# Patient Record
Sex: Male | Born: 1950 | Race: White | Hispanic: No | Marital: Married | Smoking: Former smoker
Health system: Southern US, Community
[De-identification: ages and names within clinical notes are randomized; demographics above are authoritative.]

## PROBLEM LIST (undated history)

## (undated) DIAGNOSIS — B1920 Unspecified viral hepatitis C without hepatic coma: Secondary | ICD-10-CM

## (undated) DIAGNOSIS — M545 Low back pain, unspecified: Secondary | ICD-10-CM

## (undated) DIAGNOSIS — F329 Major depressive disorder, single episode, unspecified: Secondary | ICD-10-CM

## (undated) DIAGNOSIS — J329 Chronic sinusitis, unspecified: Secondary | ICD-10-CM

## (undated) DIAGNOSIS — K219 Gastro-esophageal reflux disease without esophagitis: Secondary | ICD-10-CM

## (undated) DIAGNOSIS — Z22322 Carrier or suspected carrier of Methicillin resistant Staphylococcus aureus: Secondary | ICD-10-CM

## (undated) DIAGNOSIS — I8500 Esophageal varices without bleeding: Secondary | ICD-10-CM

## (undated) DIAGNOSIS — K7460 Unspecified cirrhosis of liver: Secondary | ICD-10-CM

## (undated) DIAGNOSIS — F419 Anxiety disorder, unspecified: Secondary | ICD-10-CM

## (undated) DIAGNOSIS — F32A Depression, unspecified: Secondary | ICD-10-CM

## (undated) DIAGNOSIS — G4700 Insomnia, unspecified: Secondary | ICD-10-CM

## (undated) DIAGNOSIS — K5730 Diverticulosis of large intestine without perforation or abscess without bleeding: Secondary | ICD-10-CM

## (undated) DIAGNOSIS — N400 Enlarged prostate without lower urinary tract symptoms: Secondary | ICD-10-CM

## (undated) DIAGNOSIS — F909 Attention-deficit hyperactivity disorder, unspecified type: Secondary | ICD-10-CM

## (undated) DIAGNOSIS — F1010 Alcohol abuse, uncomplicated: Secondary | ICD-10-CM

## (undated) DIAGNOSIS — G894 Chronic pain syndrome: Secondary | ICD-10-CM

## (undated) DIAGNOSIS — F319 Bipolar disorder, unspecified: Secondary | ICD-10-CM

## (undated) DIAGNOSIS — Z87442 Personal history of urinary calculi: Secondary | ICD-10-CM

## (undated) DIAGNOSIS — G40909 Epilepsy, unspecified, not intractable, without status epilepticus: Secondary | ICD-10-CM

## (undated) DIAGNOSIS — B182 Chronic viral hepatitis C: Secondary | ICD-10-CM

## (undated) DIAGNOSIS — E119 Type 2 diabetes mellitus without complications: Secondary | ICD-10-CM

## (undated) DIAGNOSIS — E785 Hyperlipidemia, unspecified: Secondary | ICD-10-CM

## (undated) HISTORY — DX: Chronic sinusitis, unspecified: J32.9

## (undated) HISTORY — DX: Chronic pain syndrome: G89.4

## (undated) HISTORY — DX: Personal history of urinary calculi: Z87.442

## (undated) HISTORY — DX: Anxiety disorder, unspecified: F41.9

## (undated) HISTORY — DX: Major depressive disorder, single episode, unspecified: F32.9

## (undated) HISTORY — DX: Alcohol abuse, uncomplicated: F10.10

## (undated) HISTORY — PX: TONSILLECTOMY: SUR1361

## (undated) HISTORY — DX: Hyperlipidemia, unspecified: E78.5

## (undated) HISTORY — DX: Bipolar disorder, unspecified: F31.9

## (undated) HISTORY — DX: Gastro-esophageal reflux disease without esophagitis: K21.9

## (undated) HISTORY — DX: Chronic viral hepatitis C: B18.2

## (undated) HISTORY — DX: Attention-deficit hyperactivity disorder, unspecified type: F90.9

## (undated) HISTORY — DX: Esophageal varices without bleeding: I85.00

## (undated) HISTORY — DX: Depression, unspecified: F32.A

## (undated) HISTORY — DX: Diverticulosis of large intestine without perforation or abscess without bleeding: K57.30

## (undated) HISTORY — DX: Unspecified cirrhosis of liver: K74.60

## (undated) HISTORY — DX: Unspecified viral hepatitis C without hepatic coma: B19.20

## (undated) HISTORY — DX: Type 2 diabetes mellitus without complications: E11.9

## (undated) HISTORY — DX: Low back pain, unspecified: M54.50

## (undated) HISTORY — DX: Insomnia, unspecified: G47.00

## (undated) HISTORY — DX: Low back pain: M54.5

## (undated) HISTORY — DX: Epilepsy, unspecified, not intractable, without status epilepticus: G40.909

## (undated) HISTORY — DX: Benign prostatic hyperplasia without lower urinary tract symptoms: N40.0

---

## 1999-04-04 ENCOUNTER — Encounter: Payer: Self-pay | Admitting: Neurosurgery

## 1999-04-04 ENCOUNTER — Inpatient Hospital Stay (HOSPITAL_COMMUNITY): Admission: RE | Admit: 1999-04-04 | Discharge: 1999-04-04 | Payer: Self-pay | Admitting: Neurosurgery

## 2000-01-21 ENCOUNTER — Ambulatory Visit (HOSPITAL_COMMUNITY): Admission: RE | Admit: 2000-01-21 | Discharge: 2000-01-21 | Payer: Self-pay | Admitting: Neurosurgery

## 2000-01-21 ENCOUNTER — Encounter: Payer: Self-pay | Admitting: Neurosurgery

## 2000-02-06 ENCOUNTER — Encounter: Payer: Self-pay | Admitting: Neurosurgery

## 2000-02-06 ENCOUNTER — Ambulatory Visit (HOSPITAL_COMMUNITY): Admission: RE | Admit: 2000-02-06 | Discharge: 2000-02-06 | Payer: Self-pay | Admitting: Neurosurgery

## 2000-02-20 ENCOUNTER — Encounter: Payer: Self-pay | Admitting: Neurosurgery

## 2000-02-20 ENCOUNTER — Ambulatory Visit (HOSPITAL_COMMUNITY): Admission: RE | Admit: 2000-02-20 | Discharge: 2000-02-20 | Payer: Self-pay | Admitting: Neurosurgery

## 2000-03-05 ENCOUNTER — Encounter: Payer: Self-pay | Admitting: Neurosurgery

## 2000-03-05 ENCOUNTER — Ambulatory Visit (HOSPITAL_COMMUNITY): Admission: RE | Admit: 2000-03-05 | Discharge: 2000-03-05 | Payer: Self-pay | Admitting: Neurosurgery

## 2000-04-10 ENCOUNTER — Ambulatory Visit (HOSPITAL_COMMUNITY): Admission: RE | Admit: 2000-04-10 | Discharge: 2000-04-10 | Payer: Self-pay | Admitting: Neurosurgery

## 2000-04-10 ENCOUNTER — Encounter: Payer: Self-pay | Admitting: Neurosurgery

## 2000-04-25 ENCOUNTER — Ambulatory Visit (HOSPITAL_COMMUNITY): Admission: RE | Admit: 2000-04-25 | Discharge: 2000-04-26 | Payer: Self-pay | Admitting: Neurosurgery

## 2000-04-25 ENCOUNTER — Encounter: Payer: Self-pay | Admitting: Neurosurgery

## 2000-05-02 ENCOUNTER — Emergency Department (HOSPITAL_COMMUNITY): Admission: EM | Admit: 2000-05-02 | Discharge: 2000-05-03 | Payer: Self-pay | Admitting: Emergency Medicine

## 2000-05-02 ENCOUNTER — Encounter: Payer: Self-pay | Admitting: Emergency Medicine

## 2000-05-15 ENCOUNTER — Ambulatory Visit (HOSPITAL_COMMUNITY): Admission: RE | Admit: 2000-05-15 | Discharge: 2000-05-15 | Payer: Self-pay

## 2000-11-22 ENCOUNTER — Encounter: Payer: Self-pay | Admitting: *Deleted

## 2000-11-22 ENCOUNTER — Ambulatory Visit (HOSPITAL_COMMUNITY): Admission: RE | Admit: 2000-11-22 | Discharge: 2000-11-22 | Payer: Self-pay | Admitting: *Deleted

## 2001-03-27 ENCOUNTER — Encounter: Payer: Self-pay | Admitting: Neurosurgery

## 2001-03-27 ENCOUNTER — Ambulatory Visit (HOSPITAL_COMMUNITY): Admission: RE | Admit: 2001-03-27 | Discharge: 2001-03-28 | Payer: Self-pay | Admitting: Neurosurgery

## 2001-06-02 ENCOUNTER — Ambulatory Visit (HOSPITAL_COMMUNITY): Admission: RE | Admit: 2001-06-02 | Discharge: 2001-06-02 | Payer: Self-pay | Admitting: Internal Medicine

## 2001-06-02 ENCOUNTER — Encounter: Payer: Self-pay | Admitting: Internal Medicine

## 2001-08-11 ENCOUNTER — Inpatient Hospital Stay (HOSPITAL_COMMUNITY): Admission: EM | Admit: 2001-08-11 | Discharge: 2001-08-17 | Payer: Self-pay | Admitting: Psychiatry

## 2001-09-02 ENCOUNTER — Encounter: Payer: Self-pay | Admitting: Neurology

## 2001-09-02 ENCOUNTER — Encounter: Payer: Self-pay | Admitting: Emergency Medicine

## 2001-09-02 ENCOUNTER — Inpatient Hospital Stay (HOSPITAL_COMMUNITY): Admission: EM | Admit: 2001-09-02 | Discharge: 2001-09-03 | Payer: Self-pay | Admitting: Neurology

## 2002-08-26 ENCOUNTER — Ambulatory Visit (HOSPITAL_BASED_OUTPATIENT_CLINIC_OR_DEPARTMENT_OTHER): Admission: RE | Admit: 2002-08-26 | Discharge: 2002-08-26 | Payer: Self-pay | Admitting: Urology

## 2003-03-23 ENCOUNTER — Encounter: Admission: RE | Admit: 2003-03-23 | Discharge: 2003-06-21 | Payer: Self-pay | Admitting: Anesthesiology

## 2003-05-28 ENCOUNTER — Ambulatory Visit (HOSPITAL_COMMUNITY): Admission: RE | Admit: 2003-05-28 | Discharge: 2003-05-28 | Payer: Self-pay | Admitting: Anesthesiology

## 2003-06-20 ENCOUNTER — Encounter
Admission: RE | Admit: 2003-06-20 | Discharge: 2003-09-18 | Payer: Self-pay | Admitting: Physical Medicine and Rehabilitation

## 2003-09-16 ENCOUNTER — Encounter
Admission: RE | Admit: 2003-09-16 | Discharge: 2003-11-09 | Payer: Self-pay | Admitting: Physical Medicine and Rehabilitation

## 2003-10-11 ENCOUNTER — Emergency Department (HOSPITAL_COMMUNITY): Admission: EM | Admit: 2003-10-11 | Discharge: 2003-10-11 | Payer: Self-pay | Admitting: Emergency Medicine

## 2003-11-09 ENCOUNTER — Encounter
Admission: RE | Admit: 2003-11-09 | Discharge: 2003-12-30 | Payer: Self-pay | Admitting: Physical Medicine and Rehabilitation

## 2003-11-11 ENCOUNTER — Ambulatory Visit: Payer: Self-pay | Admitting: Physical Medicine and Rehabilitation

## 2003-12-30 ENCOUNTER — Encounter
Admission: RE | Admit: 2003-12-30 | Discharge: 2004-03-29 | Payer: Self-pay | Admitting: Physical Medicine and Rehabilitation

## 2004-02-03 ENCOUNTER — Ambulatory Visit: Payer: Self-pay | Admitting: Physical Medicine and Rehabilitation

## 2004-04-11 ENCOUNTER — Ambulatory Visit: Payer: Self-pay | Admitting: Physical Medicine and Rehabilitation

## 2004-04-11 ENCOUNTER — Encounter
Admission: RE | Admit: 2004-04-11 | Discharge: 2004-07-10 | Payer: Self-pay | Admitting: Physical Medicine and Rehabilitation

## 2004-05-31 ENCOUNTER — Ambulatory Visit: Payer: Self-pay | Admitting: Physical Medicine and Rehabilitation

## 2004-06-14 ENCOUNTER — Ambulatory Visit: Payer: Self-pay | Admitting: Physical Medicine & Rehabilitation

## 2004-07-31 ENCOUNTER — Encounter
Admission: RE | Admit: 2004-07-31 | Discharge: 2004-10-29 | Payer: Self-pay | Admitting: Physical Medicine and Rehabilitation

## 2004-08-01 ENCOUNTER — Ambulatory Visit: Payer: Self-pay | Admitting: Physical Medicine and Rehabilitation

## 2004-09-05 ENCOUNTER — Ambulatory Visit: Payer: Self-pay | Admitting: Physical Medicine and Rehabilitation

## 2004-09-06 ENCOUNTER — Ambulatory Visit: Payer: Self-pay | Admitting: Internal Medicine

## 2004-09-19 ENCOUNTER — Ambulatory Visit: Payer: Self-pay | Admitting: Internal Medicine

## 2004-11-02 ENCOUNTER — Ambulatory Visit: Payer: Self-pay | Admitting: Physical Medicine and Rehabilitation

## 2004-11-02 ENCOUNTER — Encounter
Admission: RE | Admit: 2004-11-02 | Discharge: 2005-01-31 | Payer: Self-pay | Admitting: Physical Medicine and Rehabilitation

## 2004-12-19 ENCOUNTER — Ambulatory Visit: Payer: Self-pay | Admitting: Physical Medicine and Rehabilitation

## 2005-02-10 ENCOUNTER — Ambulatory Visit: Payer: Self-pay | Admitting: Physical Medicine and Rehabilitation

## 2005-02-10 ENCOUNTER — Encounter
Admission: RE | Admit: 2005-02-10 | Discharge: 2005-05-11 | Payer: Self-pay | Admitting: Physical Medicine and Rehabilitation

## 2005-03-13 ENCOUNTER — Ambulatory Visit: Payer: Self-pay | Admitting: Physical Medicine and Rehabilitation

## 2005-04-23 ENCOUNTER — Ambulatory Visit: Payer: Self-pay | Admitting: Physical Medicine and Rehabilitation

## 2005-05-16 ENCOUNTER — Encounter
Admission: RE | Admit: 2005-05-16 | Discharge: 2005-08-14 | Payer: Self-pay | Admitting: Physical Medicine and Rehabilitation

## 2005-06-13 ENCOUNTER — Ambulatory Visit: Payer: Self-pay | Admitting: Physical Medicine and Rehabilitation

## 2005-07-16 ENCOUNTER — Ambulatory Visit: Payer: Self-pay | Admitting: Physical Medicine and Rehabilitation

## 2005-07-24 ENCOUNTER — Ambulatory Visit: Payer: Self-pay | Admitting: Physical Medicine and Rehabilitation

## 2005-08-19 ENCOUNTER — Encounter
Admission: RE | Admit: 2005-08-19 | Discharge: 2005-11-17 | Payer: Self-pay | Admitting: Physical Medicine and Rehabilitation

## 2005-09-17 ENCOUNTER — Ambulatory Visit: Payer: Self-pay | Admitting: Physical Medicine and Rehabilitation

## 2005-10-24 ENCOUNTER — Ambulatory Visit: Payer: Self-pay | Admitting: Internal Medicine

## 2005-10-29 ENCOUNTER — Ambulatory Visit: Payer: Self-pay | Admitting: Internal Medicine

## 2005-11-12 ENCOUNTER — Ambulatory Visit: Payer: Self-pay | Admitting: Internal Medicine

## 2005-11-14 ENCOUNTER — Ambulatory Visit: Payer: Self-pay | Admitting: Physical Medicine and Rehabilitation

## 2005-11-25 ENCOUNTER — Ambulatory Visit: Payer: Self-pay | Admitting: Internal Medicine

## 2005-12-12 ENCOUNTER — Encounter
Admission: RE | Admit: 2005-12-12 | Discharge: 2006-03-12 | Payer: Self-pay | Admitting: Physical Medicine and Rehabilitation

## 2006-01-07 ENCOUNTER — Ambulatory Visit: Payer: Self-pay | Admitting: Physical Medicine and Rehabilitation

## 2006-03-05 ENCOUNTER — Ambulatory Visit: Payer: Self-pay | Admitting: Physical Medicine and Rehabilitation

## 2006-04-01 ENCOUNTER — Encounter
Admission: RE | Admit: 2006-04-01 | Discharge: 2006-06-30 | Payer: Self-pay | Admitting: Physical Medicine and Rehabilitation

## 2006-04-28 ENCOUNTER — Ambulatory Visit: Payer: Self-pay | Admitting: Physical Medicine and Rehabilitation

## 2006-06-02 ENCOUNTER — Ambulatory Visit: Payer: Self-pay | Admitting: Physical Medicine and Rehabilitation

## 2006-06-04 ENCOUNTER — Ambulatory Visit: Payer: Self-pay | Admitting: Internal Medicine

## 2006-07-01 ENCOUNTER — Encounter
Admission: RE | Admit: 2006-07-01 | Discharge: 2006-09-29 | Payer: Self-pay | Admitting: Physical Medicine and Rehabilitation

## 2006-07-01 ENCOUNTER — Ambulatory Visit: Payer: Self-pay | Admitting: Physical Medicine and Rehabilitation

## 2006-08-26 ENCOUNTER — Ambulatory Visit: Payer: Self-pay | Admitting: Physical Medicine and Rehabilitation

## 2006-08-28 ENCOUNTER — Ambulatory Visit: Payer: Self-pay | Admitting: Internal Medicine

## 2006-08-28 LAB — CONVERTED CEMR LAB
Albumin: 3.5 g/dL (ref 3.5–5.2)
Alkaline Phosphatase: 132 units/L — ABNORMAL HIGH (ref 39–117)
BUN: 11 mg/dL (ref 6–23)
Basophils Absolute: 0.1 10*3/uL (ref 0.0–0.1)
Chloride: 104 meq/L (ref 96–112)
Creatinine, Ser: 0.7 mg/dL (ref 0.4–1.5)
GFR calc non Af Amer: 124 mL/min
MCHC: 34.3 g/dL (ref 30.0–36.0)
Monocytes Absolute: 0.9 10*3/uL — ABNORMAL HIGH (ref 0.2–0.7)
Monocytes Relative: 8.3 % (ref 3.0–11.0)
Potassium: 4 meq/L (ref 3.5–5.1)
RBC: 5.25 M/uL (ref 4.22–5.81)
RDW: 12.4 % (ref 11.5–14.6)
TSH: 1.08 microintl units/mL (ref 0.35–5.50)
Total Bilirubin: 1.5 mg/dL — ABNORMAL HIGH (ref 0.3–1.2)

## 2006-09-02 ENCOUNTER — Ambulatory Visit: Payer: Self-pay | Admitting: Internal Medicine

## 2006-09-02 LAB — CONVERTED CEMR LAB
ALT: 63 units/L — ABNORMAL HIGH (ref 0–53)
AST: 83 units/L — ABNORMAL HIGH (ref 0–37)
Albumin: 3.4 g/dL — ABNORMAL LOW (ref 3.5–5.2)
Alkaline Phosphatase: 133 units/L — ABNORMAL HIGH (ref 39–117)
Bilirubin, Direct: 0.4 mg/dL — ABNORMAL HIGH (ref 0.0–0.3)
HCV Ab: POSITIVE — AB
Hep A IgM: NEGATIVE

## 2006-09-04 ENCOUNTER — Ambulatory Visit: Payer: Self-pay | Admitting: Internal Medicine

## 2006-09-04 LAB — CONVERTED CEMR LAB: HCV Quantitative: 1800000 intl units/mL — ABNORMAL HIGH (ref <5)

## 2006-09-12 ENCOUNTER — Ambulatory Visit: Payer: Self-pay | Admitting: Gastroenterology

## 2006-09-23 DIAGNOSIS — K219 Gastro-esophageal reflux disease without esophagitis: Secondary | ICD-10-CM

## 2006-09-23 DIAGNOSIS — B1710 Acute hepatitis C without hepatic coma: Secondary | ICD-10-CM

## 2006-09-23 DIAGNOSIS — F329 Major depressive disorder, single episode, unspecified: Secondary | ICD-10-CM

## 2006-09-23 DIAGNOSIS — M545 Low back pain: Secondary | ICD-10-CM

## 2006-09-23 DIAGNOSIS — J309 Allergic rhinitis, unspecified: Secondary | ICD-10-CM

## 2006-09-23 DIAGNOSIS — F909 Attention-deficit hyperactivity disorder, unspecified type: Secondary | ICD-10-CM

## 2006-09-23 DIAGNOSIS — R569 Unspecified convulsions: Secondary | ICD-10-CM

## 2006-09-23 DIAGNOSIS — E785 Hyperlipidemia, unspecified: Secondary | ICD-10-CM

## 2006-09-23 DIAGNOSIS — F319 Bipolar disorder, unspecified: Secondary | ICD-10-CM

## 2006-09-23 HISTORY — DX: Gastro-esophageal reflux disease without esophagitis: K21.9

## 2006-09-23 HISTORY — DX: Attention-deficit hyperactivity disorder, unspecified type: F90.9

## 2006-10-21 ENCOUNTER — Encounter
Admission: RE | Admit: 2006-10-21 | Discharge: 2007-01-19 | Payer: Self-pay | Admitting: Physical Medicine and Rehabilitation

## 2006-10-22 ENCOUNTER — Ambulatory Visit: Payer: Self-pay | Admitting: Physical Medicine and Rehabilitation

## 2006-11-01 ENCOUNTER — Encounter: Payer: Self-pay | Admitting: *Deleted

## 2006-12-16 ENCOUNTER — Ambulatory Visit: Payer: Self-pay | Admitting: Physical Medicine and Rehabilitation

## 2007-01-05 ENCOUNTER — Telehealth (INDEPENDENT_AMBULATORY_CARE_PROVIDER_SITE_OTHER): Payer: Self-pay | Admitting: *Deleted

## 2007-01-27 ENCOUNTER — Ambulatory Visit: Payer: Self-pay | Admitting: Gastroenterology

## 2007-02-06 ENCOUNTER — Ambulatory Visit: Payer: Self-pay | Admitting: Physical Medicine and Rehabilitation

## 2007-02-06 ENCOUNTER — Encounter
Admission: RE | Admit: 2007-02-06 | Discharge: 2007-02-09 | Payer: Self-pay | Admitting: Physical Medicine and Rehabilitation

## 2007-03-06 ENCOUNTER — Encounter
Admission: RE | Admit: 2007-03-06 | Discharge: 2007-06-04 | Payer: Self-pay | Admitting: Physical Medicine and Rehabilitation

## 2007-04-03 ENCOUNTER — Ambulatory Visit: Payer: Self-pay | Admitting: Physical Medicine and Rehabilitation

## 2007-04-24 ENCOUNTER — Telehealth (INDEPENDENT_AMBULATORY_CARE_PROVIDER_SITE_OTHER): Payer: Self-pay | Admitting: *Deleted

## 2007-05-05 ENCOUNTER — Ambulatory Visit: Payer: Self-pay | Admitting: Physical Medicine and Rehabilitation

## 2007-05-25 ENCOUNTER — Telehealth (INDEPENDENT_AMBULATORY_CARE_PROVIDER_SITE_OTHER): Payer: Self-pay | Admitting: *Deleted

## 2007-06-01 ENCOUNTER — Encounter
Admission: RE | Admit: 2007-06-01 | Discharge: 2007-08-30 | Payer: Self-pay | Admitting: Physical Medicine and Rehabilitation

## 2007-06-03 ENCOUNTER — Ambulatory Visit: Payer: Self-pay | Admitting: Physical Medicine and Rehabilitation

## 2007-07-01 ENCOUNTER — Ambulatory Visit: Payer: Self-pay | Admitting: Physical Medicine and Rehabilitation

## 2007-07-29 ENCOUNTER — Ambulatory Visit: Payer: Self-pay | Admitting: Physical Medicine and Rehabilitation

## 2007-08-27 ENCOUNTER — Telehealth (INDEPENDENT_AMBULATORY_CARE_PROVIDER_SITE_OTHER): Payer: Self-pay | Admitting: *Deleted

## 2007-08-27 ENCOUNTER — Encounter
Admission: RE | Admit: 2007-08-27 | Discharge: 2007-11-25 | Payer: Self-pay | Admitting: Physical Medicine and Rehabilitation

## 2007-08-28 ENCOUNTER — Ambulatory Visit: Payer: Self-pay | Admitting: Physical Medicine and Rehabilitation

## 2007-09-24 ENCOUNTER — Ambulatory Visit: Payer: Self-pay | Admitting: Physical Medicine and Rehabilitation

## 2007-09-28 ENCOUNTER — Telehealth (INDEPENDENT_AMBULATORY_CARE_PROVIDER_SITE_OTHER): Payer: Self-pay | Admitting: *Deleted

## 2007-10-30 ENCOUNTER — Ambulatory Visit: Payer: Self-pay | Admitting: Physical Medicine and Rehabilitation

## 2007-10-30 ENCOUNTER — Encounter: Payer: Self-pay | Admitting: Internal Medicine

## 2007-11-02 ENCOUNTER — Ambulatory Visit: Payer: Self-pay | Admitting: Internal Medicine

## 2007-11-02 DIAGNOSIS — R5383 Other fatigue: Secondary | ICD-10-CM

## 2007-11-02 DIAGNOSIS — Z87442 Personal history of urinary calculi: Secondary | ICD-10-CM

## 2007-11-02 DIAGNOSIS — N400 Enlarged prostate without lower urinary tract symptoms: Secondary | ICD-10-CM

## 2007-11-02 DIAGNOSIS — R5381 Other malaise: Secondary | ICD-10-CM

## 2007-11-02 HISTORY — DX: Personal history of urinary calculi: Z87.442

## 2007-11-02 LAB — CONVERTED CEMR LAB
Albumin: 3.4 g/dL — ABNORMAL LOW (ref 3.5–5.2)
Alkaline Phosphatase: 168 units/L — ABNORMAL HIGH (ref 39–117)
Bilirubin, Direct: 0.7 mg/dL — ABNORMAL HIGH (ref 0.0–0.3)
Calcium: 8.9 mg/dL (ref 8.4–10.5)
Cholesterol: 173 mg/dL (ref 0–200)
Direct LDL: 81.5 mg/dL
Eosinophils Absolute: 0.3 10*3/uL (ref 0.0–0.7)
GFR calc Af Amer: 128 mL/min
GFR calc non Af Amer: 106 mL/min
HCT: 46.4 % (ref 39.0–52.0)
Hemoglobin: 16.4 g/dL (ref 13.0–17.0)
MCHC: 35.4 g/dL (ref 30.0–36.0)
MCV: 94.2 fL (ref 78.0–100.0)
Monocytes Absolute: 0.6 10*3/uL (ref 0.1–1.0)
Neutro Abs: 3.2 10*3/uL (ref 1.4–7.7)
PSA: 0.11 ng/mL (ref 0.10–4.00)
Platelets: 208 10*3/uL (ref 150–400)
Potassium: 4.1 meq/L (ref 3.5–5.1)
RDW: 12.4 % (ref 11.5–14.6)
Sodium: 142 meq/L (ref 135–145)
Total Protein: 7.1 g/dL (ref 6.0–8.3)
Triglycerides: 228 mg/dL (ref 0–149)
Uric Acid, Serum: 6.3 mg/dL (ref 4.0–7.8)

## 2007-11-25 ENCOUNTER — Encounter
Admission: RE | Admit: 2007-11-25 | Discharge: 2008-02-23 | Payer: Self-pay | Admitting: Physical Medicine and Rehabilitation

## 2007-11-27 ENCOUNTER — Ambulatory Visit: Payer: Self-pay | Admitting: Physical Medicine and Rehabilitation

## 2007-12-25 ENCOUNTER — Ambulatory Visit: Payer: Self-pay | Admitting: Physical Medicine and Rehabilitation

## 2007-12-29 ENCOUNTER — Telehealth (INDEPENDENT_AMBULATORY_CARE_PROVIDER_SITE_OTHER): Payer: Self-pay | Admitting: *Deleted

## 2007-12-30 ENCOUNTER — Ambulatory Visit: Payer: Self-pay | Admitting: Internal Medicine

## 2007-12-30 DIAGNOSIS — J019 Acute sinusitis, unspecified: Secondary | ICD-10-CM | POA: Insufficient documentation

## 2008-01-01 ENCOUNTER — Telehealth (INDEPENDENT_AMBULATORY_CARE_PROVIDER_SITE_OTHER): Payer: Self-pay | Admitting: *Deleted

## 2008-01-22 ENCOUNTER — Ambulatory Visit: Payer: Self-pay | Admitting: Physical Medicine and Rehabilitation

## 2008-02-01 ENCOUNTER — Telehealth (INDEPENDENT_AMBULATORY_CARE_PROVIDER_SITE_OTHER): Payer: Self-pay | Admitting: *Deleted

## 2008-02-15 ENCOUNTER — Ambulatory Visit: Payer: Self-pay | Admitting: Physical Medicine and Rehabilitation

## 2008-03-04 HISTORY — PX: OTHER SURGICAL HISTORY: SHX169

## 2008-03-09 ENCOUNTER — Telehealth (INDEPENDENT_AMBULATORY_CARE_PROVIDER_SITE_OTHER): Payer: Self-pay | Admitting: *Deleted

## 2008-03-15 ENCOUNTER — Encounter
Admission: RE | Admit: 2008-03-15 | Discharge: 2008-06-13 | Payer: Self-pay | Admitting: Physical Medicine and Rehabilitation

## 2008-03-18 ENCOUNTER — Telehealth (INDEPENDENT_AMBULATORY_CARE_PROVIDER_SITE_OTHER): Payer: Self-pay | Admitting: *Deleted

## 2008-03-18 ENCOUNTER — Ambulatory Visit: Payer: Self-pay | Admitting: Physical Medicine and Rehabilitation

## 2008-03-19 ENCOUNTER — Ambulatory Visit: Payer: Self-pay | Admitting: *Deleted

## 2008-03-28 ENCOUNTER — Telehealth (INDEPENDENT_AMBULATORY_CARE_PROVIDER_SITE_OTHER): Payer: Self-pay | Admitting: *Deleted

## 2008-04-07 ENCOUNTER — Telehealth (INDEPENDENT_AMBULATORY_CARE_PROVIDER_SITE_OTHER): Payer: Self-pay | Admitting: *Deleted

## 2008-04-12 ENCOUNTER — Telehealth (INDEPENDENT_AMBULATORY_CARE_PROVIDER_SITE_OTHER): Payer: Self-pay | Admitting: *Deleted

## 2008-04-15 ENCOUNTER — Telehealth (INDEPENDENT_AMBULATORY_CARE_PROVIDER_SITE_OTHER): Payer: Self-pay | Admitting: *Deleted

## 2008-04-15 ENCOUNTER — Ambulatory Visit: Payer: Self-pay | Admitting: Physical Medicine and Rehabilitation

## 2008-04-26 ENCOUNTER — Encounter: Payer: Self-pay | Admitting: Internal Medicine

## 2008-05-04 ENCOUNTER — Telehealth (INDEPENDENT_AMBULATORY_CARE_PROVIDER_SITE_OTHER): Payer: Self-pay | Admitting: *Deleted

## 2008-05-13 ENCOUNTER — Ambulatory Visit: Payer: Self-pay | Admitting: Physical Medicine and Rehabilitation

## 2008-05-18 ENCOUNTER — Encounter: Payer: Self-pay | Admitting: Internal Medicine

## 2008-06-06 ENCOUNTER — Telehealth (INDEPENDENT_AMBULATORY_CARE_PROVIDER_SITE_OTHER): Payer: Self-pay | Admitting: *Deleted

## 2008-06-15 ENCOUNTER — Encounter: Payer: Self-pay | Admitting: Internal Medicine

## 2008-06-17 ENCOUNTER — Ambulatory Visit: Payer: Self-pay | Admitting: Physical Medicine and Rehabilitation

## 2008-06-17 ENCOUNTER — Encounter
Admission: RE | Admit: 2008-06-17 | Discharge: 2008-09-15 | Payer: Self-pay | Admitting: Physical Medicine and Rehabilitation

## 2008-06-28 ENCOUNTER — Telehealth: Payer: Self-pay | Admitting: Internal Medicine

## 2008-06-30 ENCOUNTER — Telehealth (INDEPENDENT_AMBULATORY_CARE_PROVIDER_SITE_OTHER): Payer: Self-pay | Admitting: *Deleted

## 2008-07-07 ENCOUNTER — Telehealth (INDEPENDENT_AMBULATORY_CARE_PROVIDER_SITE_OTHER): Payer: Self-pay | Admitting: *Deleted

## 2008-07-15 ENCOUNTER — Ambulatory Visit: Payer: Self-pay | Admitting: Physical Medicine and Rehabilitation

## 2008-08-17 ENCOUNTER — Telehealth (INDEPENDENT_AMBULATORY_CARE_PROVIDER_SITE_OTHER): Payer: Self-pay | Admitting: *Deleted

## 2008-08-24 ENCOUNTER — Ambulatory Visit: Payer: Self-pay | Admitting: Physical Medicine and Rehabilitation

## 2008-09-01 ENCOUNTER — Telehealth (INDEPENDENT_AMBULATORY_CARE_PROVIDER_SITE_OTHER): Payer: Self-pay | Admitting: *Deleted

## 2008-09-16 ENCOUNTER — Telehealth: Payer: Self-pay | Admitting: Internal Medicine

## 2008-09-19 ENCOUNTER — Telehealth: Payer: Self-pay | Admitting: Internal Medicine

## 2008-09-20 ENCOUNTER — Encounter
Admission: RE | Admit: 2008-09-20 | Discharge: 2008-12-19 | Payer: Self-pay | Admitting: Physical Medicine and Rehabilitation

## 2008-09-21 ENCOUNTER — Ambulatory Visit: Payer: Self-pay | Admitting: Physical Medicine and Rehabilitation

## 2008-10-19 ENCOUNTER — Ambulatory Visit: Payer: Self-pay | Admitting: Physical Medicine and Rehabilitation

## 2008-11-14 ENCOUNTER — Ambulatory Visit: Payer: Self-pay | Admitting: Physical Medicine and Rehabilitation

## 2008-11-16 ENCOUNTER — Telehealth: Payer: Self-pay | Admitting: Internal Medicine

## 2008-12-19 ENCOUNTER — Telehealth: Payer: Self-pay | Admitting: Internal Medicine

## 2008-12-23 ENCOUNTER — Telehealth: Payer: Self-pay | Admitting: Internal Medicine

## 2009-01-16 ENCOUNTER — Telehealth: Payer: Self-pay | Admitting: Internal Medicine

## 2009-01-18 ENCOUNTER — Ambulatory Visit: Payer: Self-pay | Admitting: Internal Medicine

## 2009-01-18 DIAGNOSIS — R509 Fever, unspecified: Secondary | ICD-10-CM

## 2009-01-18 DIAGNOSIS — G894 Chronic pain syndrome: Secondary | ICD-10-CM

## 2009-01-18 DIAGNOSIS — M79609 Pain in unspecified limb: Secondary | ICD-10-CM | POA: Insufficient documentation

## 2009-01-18 HISTORY — DX: Chronic pain syndrome: G89.4

## 2009-01-19 LAB — CONVERTED CEMR LAB
ALT: 20 units/L (ref 0–53)
AST: 40 units/L — ABNORMAL HIGH (ref 0–37)
Alkaline Phosphatase: 157 units/L — ABNORMAL HIGH (ref 39–117)
BUN: 7 mg/dL (ref 6–23)
Basophils Absolute: 0.2 10*3/uL — ABNORMAL HIGH (ref 0.0–0.1)
Bilirubin, Direct: 0.4 mg/dL — ABNORMAL HIGH (ref 0.0–0.3)
Calcium: 8.7 mg/dL (ref 8.4–10.5)
Cholesterol: 162 mg/dL (ref 0–200)
Eosinophils Relative: 4.9 % (ref 0.0–5.0)
GFR calc non Af Amer: 122.72 mL/min (ref >60)
Glucose, Bld: 112 mg/dL — ABNORMAL HIGH (ref 70–99)
HCT: 42.8 % (ref 39.0–52.0)
Hgb A1c MFr Bld: 6.1 % (ref 4.6–6.5)
LDL Cholesterol: 80 mg/dL (ref 0–99)
Lymphocytes Relative: 32.2 % (ref 12.0–46.0)
Lymphs Abs: 2.7 10*3/uL (ref 0.7–4.0)
Monocytes Relative: 6 % (ref 3.0–12.0)
PSA: 0.08 ng/mL — ABNORMAL LOW (ref 0.10–4.00)
Platelets: 131 10*3/uL — ABNORMAL LOW (ref 150.0–400.0)
Potassium: 3.5 meq/L (ref 3.5–5.1)
RDW: 13.1 % (ref 11.5–14.6)
Sodium: 142 meq/L (ref 135–145)
TSH: 1.22 microintl units/mL (ref 0.35–5.50)
Total Bilirubin: 1.4 mg/dL — ABNORMAL HIGH (ref 0.3–1.2)
Total CHOL/HDL Ratio: 3
VLDL: 27.6 mg/dL (ref 0.0–40.0)
WBC: 8.3 10*3/uL (ref 4.5–10.5)

## 2009-01-20 ENCOUNTER — Telehealth: Payer: Self-pay | Admitting: Internal Medicine

## 2009-01-23 ENCOUNTER — Encounter: Payer: Self-pay | Admitting: Internal Medicine

## 2009-03-01 ENCOUNTER — Encounter: Payer: Self-pay | Admitting: Internal Medicine

## 2009-03-21 ENCOUNTER — Encounter: Payer: Self-pay | Admitting: Internal Medicine

## 2009-03-23 ENCOUNTER — Encounter: Payer: Self-pay | Admitting: Internal Medicine

## 2009-04-12 ENCOUNTER — Telehealth: Payer: Self-pay | Admitting: Internal Medicine

## 2009-04-19 ENCOUNTER — Ambulatory Visit: Payer: Self-pay | Admitting: Internal Medicine

## 2009-04-19 DIAGNOSIS — K5730 Diverticulosis of large intestine without perforation or abscess without bleeding: Secondary | ICD-10-CM | POA: Insufficient documentation

## 2009-04-19 DIAGNOSIS — G4700 Insomnia, unspecified: Secondary | ICD-10-CM

## 2009-04-19 DIAGNOSIS — F411 Generalized anxiety disorder: Secondary | ICD-10-CM | POA: Insufficient documentation

## 2009-04-19 DIAGNOSIS — I8500 Esophageal varices without bleeding: Secondary | ICD-10-CM

## 2009-04-19 HISTORY — DX: Insomnia, unspecified: G47.00

## 2009-04-19 HISTORY — DX: Esophageal varices without bleeding: I85.00

## 2009-04-24 ENCOUNTER — Encounter: Payer: Self-pay | Admitting: Internal Medicine

## 2009-05-18 ENCOUNTER — Encounter: Payer: Self-pay | Admitting: Internal Medicine

## 2009-05-18 ENCOUNTER — Telehealth: Payer: Self-pay | Admitting: Internal Medicine

## 2009-05-22 ENCOUNTER — Encounter: Payer: Self-pay | Admitting: Internal Medicine

## 2009-05-23 ENCOUNTER — Encounter: Payer: Self-pay | Admitting: Internal Medicine

## 2009-06-05 ENCOUNTER — Telehealth: Payer: Self-pay | Admitting: Internal Medicine

## 2009-06-22 ENCOUNTER — Ambulatory Visit: Payer: Self-pay | Admitting: Internal Medicine

## 2009-06-22 DIAGNOSIS — R079 Chest pain, unspecified: Secondary | ICD-10-CM | POA: Insufficient documentation

## 2009-07-07 ENCOUNTER — Telehealth: Payer: Self-pay | Admitting: Internal Medicine

## 2009-07-21 ENCOUNTER — Telehealth: Payer: Self-pay | Admitting: Internal Medicine

## 2009-08-09 ENCOUNTER — Telehealth: Payer: Self-pay | Admitting: Internal Medicine

## 2009-08-29 ENCOUNTER — Encounter: Payer: Self-pay | Admitting: Internal Medicine

## 2009-10-12 ENCOUNTER — Ambulatory Visit: Payer: Self-pay | Admitting: Internal Medicine

## 2010-01-02 ENCOUNTER — Ambulatory Visit: Payer: Self-pay | Admitting: Internal Medicine

## 2010-01-12 ENCOUNTER — Ambulatory Visit: Payer: Self-pay | Admitting: Internal Medicine

## 2010-01-30 ENCOUNTER — Telehealth: Payer: Self-pay | Admitting: Internal Medicine

## 2010-02-08 ENCOUNTER — Telehealth: Payer: Self-pay | Admitting: Internal Medicine

## 2010-02-21 ENCOUNTER — Ambulatory Visit: Payer: Self-pay | Admitting: Internal Medicine

## 2010-02-21 ENCOUNTER — Telehealth: Payer: Self-pay | Admitting: Internal Medicine

## 2010-02-21 ENCOUNTER — Encounter: Payer: Self-pay | Admitting: Internal Medicine

## 2010-02-21 LAB — CONVERTED CEMR LAB
ALT: 19 units/L (ref 0–53)
Alkaline Phosphatase: 171 units/L — ABNORMAL HIGH (ref 39–117)
Basophils Absolute: 0.1 10*3/uL (ref 0.0–0.1)
Bilirubin, Direct: 0.4 mg/dL — ABNORMAL HIGH (ref 0.0–0.3)
Calcium: 8.5 mg/dL (ref 8.4–10.5)
Chloride: 107 meq/L (ref 96–112)
Cholesterol: 148 mg/dL (ref 0–200)
Creatinine, Ser: 0.8 mg/dL (ref 0.4–1.5)
Eosinophils Absolute: 0.4 10*3/uL (ref 0.0–0.7)
Hemoglobin: 13.2 g/dL (ref 13.0–17.0)
LDL Cholesterol: 68 mg/dL (ref 0–99)
Lymphocytes Relative: 40.8 % (ref 12.0–46.0)
MCHC: 34.7 g/dL (ref 30.0–36.0)
Monocytes Relative: 7.8 % (ref 3.0–12.0)
Neutro Abs: 3.4 10*3/uL (ref 1.4–7.7)
Platelets: 132 10*3/uL — ABNORMAL LOW (ref 150.0–400.0)
RDW: 15.6 % — ABNORMAL HIGH (ref 11.5–14.6)
Sodium: 141 meq/L (ref 135–145)
Total Bilirubin: 1.7 mg/dL — ABNORMAL HIGH (ref 0.3–1.2)
Total CHOL/HDL Ratio: 2
Triglycerides: 104 mg/dL (ref 0.0–149.0)
VLDL: 20.8 mg/dL (ref 0.0–40.0)

## 2010-02-26 ENCOUNTER — Encounter: Payer: Self-pay | Admitting: Internal Medicine

## 2010-02-27 ENCOUNTER — Telehealth: Payer: Self-pay | Admitting: Internal Medicine

## 2010-02-27 ENCOUNTER — Ambulatory Visit
Admission: RE | Admit: 2010-02-27 | Discharge: 2010-02-27 | Payer: Self-pay | Source: Home / Self Care | Attending: Internal Medicine | Admitting: Internal Medicine

## 2010-04-05 NOTE — Progress Notes (Signed)
Summary: med SE?  Phone Note Call from Patient Call back at [facility] Phone [phone]   Caller: Patient Summary of Call: Pt called stating he started Cymbalta to help with pain sxs but has stopped due to SE- forgetfulness and dizziness. Pt would like to go back to  Pristiq but is unsure if he should be weaned off Cymbalta first or slowly re-started on Pristiq, Please advise. Initial call taken by: Margaret Pyle, CMA,  February 08, 2010 1:48 PM  Follow-up for Phone Call        this does sound like SE of cymbalta;  ok to just stop at this time;  ok to change back to pristiq - done escript Follow-up by: [name] MD,  February 08, 2010 4:23 PM  Additional Follow-up for Phone Call Additional follow up Details #1::        Pt advised and understood. Additional Follow-up by: Margaret Pyle, CMA,  February 08, 2010 4:33 PM   New Allergies: * CYMBALTA New/Updated Medications: PRISTIQ 50 MG XR24H-TAB (DESVENLAFAXINE SUCCINATE) 1 by mouth once daily New Allergies: * CYMBALTAPrescriptions: PRISTIQ 50 MG XR24H-TAB (DESVENLAFAXINE SUCCINATE) 1 by mouth once daily  #90 x 3   Entered and Authorized by:   [name] MD   Signed by:   [name] MD on 02/08/2010   Method used:   Electronically to        [facility]  [address] [phone]* (retail)       [address]       [place], [place]  [address]       Ph: [phone] or [phone]       Fax: [phone]   RxID:   [id number]

## 2010-04-05 NOTE — Medication Information (Signed)
Summary: Pristiq Approved/[facility]  Pristiq Approved/[facility]   Imported By: Sherian Rein 05/26/2009 09:01:34  _____________________________________________________________________  External Attachment:    Type:   Image     Comment:   External Document

## 2010-04-05 NOTE — Progress Notes (Signed)
Summary: RX REQUEST  Phone Note Call from Patient Call back at Home Phone [phone]   Caller: Patient Call For: Kurt Levins MD Summary of Call: Pt had an OV on 01/02/2010 and he stated that he still in pain, pt would like to know if he could get refill for  OXYCODONE 5MG  instead of HYDROCODONE-ACETAMINOPHEN 7.5-325MG  due to the pain. Pt also would like to know if he could get another refill of CYMBALTA 30MG  for another week. Please advise.  Initial call taken by: Livingston Diones,  February 21, 2010 8:51 AM  Follow-up for Phone Call        I'm just not comfortable with going changing the pain meds as he suggests;  we can try the cymbalta but would have to be "ongoing" as one wk is very unlikely to help  I would suggest pain clinic - can I refer?  I dont feel comfortable with his level of need for treatment, as I am not a pain specialist Follow-up by: Kurt Levins MD,  February 21, 2010 12:12 PM  Additional Follow-up for Phone Call Additional follow up Details #1::        left message on machine for pt to return my call. Margaret Pyle, CMA  February 21, 2010 2:04 PM   Pt states that if Dr Jonny Ruiz did not feel comfortable with changes he will leave as is. Pt also declined referral to pain clinic at this time. Additional Follow-up by: Margaret Pyle, CMA,  February 22, 2010 8:39 AM    Additional Follow-up for Phone Call Additional follow up Details #2::    pt would like referral to a pain specialist-preferrbly in [place] or [facility] area Follow-up by: Brenton Grills CMA Duncan Dull),  February 21, 2010 4:59 PM  Additional Follow-up for Phone Call Additional follow up Details #3:: Details for Additional Follow-up Action Taken: ok I will refer Additional Follow-up by: Kurt Levins MD,  February 21, 2010 7:42 PM

## 2010-04-05 NOTE — Letter (Signed)
Summary: [facility]  [facility]   Imported By: Sherian Rein 03/10/2009 09:27:39  _____________________________________________________________________  External Attachment:    Type:   Image     Comment:   External Document

## 2010-04-05 NOTE — Progress Notes (Signed)
Summary: med refill  Phone Note Refill Request  on May 18, 2009 2:51 PM  Refills Requested: Medication #1:  HYDROCODONE-ACETAMINOPHEN 10-325 MG TABS Take 1 tablet by mouth four times a day as needed - to fill nov 17   Dosage confirmed as above?Dosage Confirmed   [facility] [address] [id number] Initial call taken by: Scharlene Gloss,  May 18, 2009 2:52 PM  Follow-up for Phone Call        done hardcopy to LIM side B - dahlia  Follow-up by: [name] MD,  May 18, 2009 3:02 PM  Additional Follow-up for Phone Call Additional follow up Details #1::        RX faxed to pharmacy Additional Follow-up by: Margaret Pyle, CMA,  May 18, 2009 3:17 PM    New/Updated Medications: HYDROCODONE-ACETAMINOPHEN 10-325 MG TABS (HYDROCODONE-ACETAMINOPHEN) Take 1 tablet by mouth four times a day as needed - to fill  May 18, 2009 Prescriptions: HYDROCODONE-ACETAMINOPHEN 10-325 MG TABS (HYDROCODONE-ACETAMINOPHEN) Take 1 tablet by mouth four times a day as needed - to fill  May 18, 2009  #120 x 5   Entered and Authorized by:   [name] MD   Signed by:   [name] MD on 05/18/2009   Method used:   Print then Give to Patient   RxID:   [id number]

## 2010-04-05 NOTE — Progress Notes (Signed)
Summary: Call Report  Phone Note Other Incoming   Caller: [facility] Summary of Call: [facility] Triage Call Report Triage Record Num: [id number] Operator: Geanie Berlin Patient Name: Kurt Reid Call Date & Time: 02/26/2010 2:33:52PM Patient Phone: [phone] PCP: Oliver Barre Patient Gender: Male PCP Fax : [phone] Patient DOB: 01/29/1951 Practice Name: [facility] Reason for Call: Emergent call: 60 yo. Calling re severe back pain, legs shaking, & possible withdrawl from Cymbalta & Hydrocodone. Onset: 02/24/10. Speech retarded & voice sounds shaky. S/w son/Matthew d/t pt have difficulty speaking. Advised to call 911 now for confusion per Withdrawl Symptoms Guideline. Protocol(s) Used: Withdrawal Symptoms Recommended Outcome per Protocol: Activate EMS 911 Reason for Outcome: Confused and disoriented Care Advice:  ~ Do not leave patient alone.  ~ Avoid confronting or agitating patient. AFTER calling EMS 911, identify type and amount of substance taken by patient, if possible. If pills are involved, keep the remaining pills and container for identification and count.  ~  ~ Try to discourage or prevent reckless behavior (keep car keys from patient) without worsening threatening behavior.  ~ IMMEDIATE ACTION Write down provider's name. List or place the following in a bag for transport with the patient: current prescription and/or nonprescription medications; alternative treatments, therapies and medications; and street drugs.  ~ 12/ Initial call taken by: Margaret Pyle, CMA,  February 27, 2010 11:19 AM     Appended Document: Call Report I reviewed echart  no record of pt presenting to [facility] related ER

## 2010-04-05 NOTE — Assessment & Plan Note (Signed)
Summary: FELL 3 WKS AGO/ THINKS RIBS ARE BROKEN/NWS   Vital Signs:  Patient profile:   60 year old male Height:      72 inches Weight:      224 pounds BMI:     30.49 O2 Sat:      95 % on Room air Temp:     97.5 degrees F oral Pulse rate:   124 / minute BP sitting:   120 / 80  (left arm) Cuff size:   large  Vitals Entered ByZella Ball Ewing (June 22, 2009 3:23 PM)  O2 Flow:  Room air  CC: Fell 3 weeks ago, ribs and chest sore/RE   Primary Care Provider:  [name] MD  CC:  Larey Seat 3 weeks ago and ribs and chest sore/RE.  History of Present Illness: here for eval - fell in the house 3 wks ago, tripped over a wooden transition strip from one rm to the next;  caught up on a shoestrong as well, fell full body - struck on the floor primarily the right anterolat chest , has marked sharp and pleuritic pain to start but declined to seek med attention,   but pain has persisted and just not getting better and family urged him to come, worse to sleep on the right side ; forced to take a few extra pain pills,  worse to take deep breaths or cough or sneeze, has some DOE - normally can walk in from the parking lot; today much less per pt;  has a "pop" to the chest with raising the right arm above the shoulder;  has severe financial problems so had to wait until today to come in as well;  denies headache, st, fever, cough, other CP, abd pain, rash, joint pain, dizziness, syncope, wt loss.  Hydrocodone does not work for this pain, could not hardly sleep last night.  Some increased anxiety as well, but no panic or worsening depressive symtpoms.    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.52) 3)  Anxiety  (ICD-300.00) 4)  Diverticulosis, Colon  (ICD-562.10) 5)  Esophageal Varices  (ICD-456.1) 6)  Chronic Pain Syndrome  (ICD-338.4) 7)  Preventive Health Care  (ICD-V70.0) 8)  Thumb Pain, Left  (ICD-729.5) 9)  Fever Unspecified   (ICD-780.60) 10)  Sinusitis- Acute-nos  (ICD-461.9) 11)  Nephrolithiasis, Hx of  (ICD-V13.01) 12)  Special Screening Malig Neoplasms Other Sites  (ICD-V76.49) 13)  Fatigue  (ICD-780.79) 14)  Benign Prostatic Hypertrophy  (ICD-600.00) 15)  Seizure Disorder  (ICD-780.39) 16)  Allergic Rhinitis  (ICD-477.9) 17)  Low Back Pain  (ICD-724.2) 18)  Hyperlipidemia  (ICD-272.4) 19)  Hepatitis C  (ICD-070.51) 20)  Gerd  (ICD-530.81) 21)  Adhd  (ICD-314.01) 22)  Bipolar Affective Disorder  (ICD-296.80) 23)  Depression  (ICD-311)  Medications Prior to Update: 1)  Pristiq 50 Mg Xr24h-Tab (Desvenlafaxine Succinate) .Marland Kitchen.. 1 By Mouth Once Daily 2)  Adderall Xr 20 Mg  Cp24 (Amphetamine-Dextroamphetamine) .Marland Kitchen.. 1 By Mouth Once Daily - To Fill Jun 17, 2009 3)  Doxazosin Mesylate 2 Mg Tabs (Doxazosin Mesylate) .Marland Kitchen.. 1 By Mouth At Bedtime As Needed 4)  Hydrocodone-Acetaminophen 10-325 Mg Tabs (Hydrocodone-Acetaminophen) .... Take 1 Tablet By Mouth Four Times A Day As Needed - To Fill  May 18, 2009 5)  Morphine Sulfate Cr 30 Mg Xr12h-Tab (Morphine Sulfate) .Marland Kitchen.. 1 By Mouth Two Times A Day  - To Fill Jul 12, 2009 6)  Gabapentin 300 Mg Caps (  Gabapentin) .... 4 By Mouth Once Daily 7)  Alprazolam 0.5 Mg Tabs (Alprazolam) .Marland Kitchen.. 1po At Bedtime As Needed Sleep 8)  Prednisone 10 Mg Tabs (Prednisone) .... 3po Qd For 3days, Then 2po Qd For 3days, Then 1po Qd For 3days, Then Stop  Current Medications (verified): 1)  Pristiq 50 Mg Xr24h-Tab (Desvenlafaxine Succinate) .Marland Kitchen.. 1 By Mouth Once Daily 2)  Adderall Xr 20 Mg  Cp24 (Amphetamine-Dextroamphetamine) .Marland Kitchen.. 1 By Mouth Once Daily - To Fill Jun 17, 2009 3)  Doxazosin Mesylate 2 Mg Tabs (Doxazosin Mesylate) .Marland Kitchen.. 1 By Mouth At Bedtime As Needed 4)  Hydrocodone-Acetaminophen 10-325 Mg Tabs (Hydrocodone-Acetaminophen) .... Take 1 Tablet By Mouth Four Times A Day As Needed - To Fill  May 18, 2009 5)  Morphine Sulfate Cr 30 Mg Xr12h-Tab (Morphine Sulfate) .Marland Kitchen.. 1 By Mouth Two Times A  Day  - To Fill Jul 12, 2009 6)  Gabapentin 300 Mg Caps (Gabapentin) .... 4 By Mouth Once Daily 7)  Alprazolam 0.5 Mg Tabs (Alprazolam) .Marland Kitchen.. 1po At Bedtime As Needed Sleep 8)  Prednisone 10 Mg Tabs (Prednisone) .... 3po Qd For 3days, Then 2po Qd For 3days, Then 1po Qd For 3days, Then Stop 9)  Oxycodone Hcl 5 Mg Caps (Oxycodone Hcl) .Marland Kitchen.. 1 - 2 By Mouth Q 6 Hrs As Needed Pain  Allergies (verified): 1)  ! Sulfa 2)  ! * Mercury 3)  ! * Latex 4)  ! * Shellfish  Past History:  Past Medical History: Last updated: 04/19/2009 Depression Bipolar Disoder ADHD GERD Hepatitis C Hyperlipidemia, mild Low back pain, chronic Allergic rhinitis Seizure disorder, H/O Benign prostatic hypertrophy migraine hx of ETOH abuse Nephrolithiasis, hx of recurrent sinusitis portal vein HTN with esoph varices/gastropathy Diverticulosis, colon Anxiety  Past Surgical History: Last updated: 01/18/2009 Back Surgery x 5 Knee Surgery x 1 hx of fx cheekbone Tonsillectomy s/p sinus surgury aug 2010 - dr Zara Chess  Social History: Last updated: 11/02/2007 Married 3 children work - used to work for [employer], now disabled due to back pain Current Smoker Alcohol use-no  Risk Factors: Smoking Status: current (11/02/2007)  Review of Systems       all otherwise negative per pt -    Physical Exam  General:  alert.   Head:  normocephalic and atraumatic.   Eyes:  vision grossly intact, pupils equal, and pupils round.   Ears:  R ear normal and L ear normal.   Nose:  no external deformity and no nasal discharge.   Mouth:  no gingival abnormalities and pharynx pink and moist.   Neck:  supple and no masses.   Lungs:  normal respiratory effort and normal breath sounds.   Heart:  normal rate and regular rhythm.   Abdomen:  soft, non-tender, and normal bowel sounds.   Msk:  no joint tenderness and no joint swelling.  ;  has marked tender over right chest wall area between t4 and t8 levels   worse in the midaxillary to ant axillary line;  has some diffuse swelling but no bruising noted Extremities:  no edema, no erythema  Neurologic:  cranial nerves II-XII intact, strength normal in all extremities, and gait normal.   Skin:  color normal and no rashes.   Psych:  moderately anxious.     Impression & Recommendations:  Problem # 1:  CHEST PAIN (ICD-786.50)  s/p fall - with marked pain and tenderness without clear bony abnormality but cant r/o rib fx - to check films, for pain control, f/u worsening symptomsl  educated, reassured - no rib binding to be done  Orders: T-2 View CXR (71020TC) T-Ribs Unilateral 2 Views (71100TC)  Problem # 2:  CHRONIC PAIN SYNDROME (ICD-338.4) to hold the hydrocodone, ok for oxycodone for limited rx for breakthrough pain as above  Problem # 3:  ANXIETY (ICD-300.00)  His updated medication list for this problem includes:    Pristiq 50 Mg Xr24h-tab (Desvenlafaxine succinate) .Marland Kitchen... 1 by mouth once daily    Alprazolam 0.5 Mg Tabs (Alprazolam) .Marland Kitchen... 1po at bedtime as needed sleep stable overall by hx and exam, ok to continue meds/tx as is   Complete Medication List: 1)  Pristiq 50 Mg Xr24h-tab (Desvenlafaxine succinate) .Marland Kitchen.. 1 by mouth once daily 2)  Adderall Xr 20 Mg Cp24 (Amphetamine-dextroamphetamine) .Marland Kitchen.. 1 by mouth once daily - to fill Jun 17, 2009 3)  Doxazosin Mesylate 2 Mg Tabs (Doxazosin mesylate) .Marland Kitchen.. 1 by mouth at bedtime as needed 4)  Hydrocodone-acetaminophen 10-325 Mg Tabs (Hydrocodone-acetaminophen) .... Take 1 tablet by mouth four times a day as needed - to fill  May 18, 2009 5)  Morphine Sulfate Cr 30 Mg Xr12h-tab (Morphine sulfate) .Marland Kitchen.. 1 by mouth two times a day  - to fill Jul 12, 2009 6)  Gabapentin 300 Mg Caps (Gabapentin) .... 4 by mouth once daily 7)  Alprazolam 0.5 Mg Tabs (Alprazolam) .Marland Kitchen.. 1po at bedtime as needed sleep 8)  Prednisone 10 Mg Tabs (Prednisone) .... 3po qd for 3days, then 2po qd for 3days, then 1po qd for 3days,  then stop 9)  Oxycodone Hcl 5 Mg Caps (Oxycodone hcl) .Marland Kitchen.. 1 - 2 by mouth q 6 hrs as needed pain  Patient Instructions: 1)  Please take all new medications as prescribed  - the oxycodone for now for breakthrough pain 2)  HOLD (do not take) the hydrocodone when you are taking the oxycodone 3)  Continue all previous medications as before this visit , including the morphine 4)  Please go to Radiology in the basement level for your X-Ray today  5)  Please schedule a follow-up appointment in Nov 2011 with CPX labs Prescriptions: OXYCODONE HCL 5 MG CAPS (OXYCODONE HCL) 1 - 2 by mouth q 6 hrs as needed pain  #100 x 0   Entered and Authorized by:   [name] MD   Signed by:   [name] MD on 06/22/2009   Method used:   Print then Give to Patient   RxID:   [id number]

## 2010-04-05 NOTE — Assessment & Plan Note (Signed)
Summary: 3 MO ROV /NWS #   Vital Signs:  Patient profile:   59 year old male Height:      74 inches Weight:      228.25 pounds BMI:     29.41 O2 Sat:      97 % on Room air Temp:     98.7 degrees F oral Pulse rate:   118 / minute BP sitting:   132 / 82  (left arm) Cuff size:   regular  Vitals Entered ByZella Ball Ewing (April 19, 2009 10:00 AM)  O2 Flow:  Room air CC: 3 Mo ROV/RE   Primary Care Provider:  [name] MD  CC:  3 Mo ROV/RE.  History of Present Illness: increased effexor seems to help, but still thinks the pristiq was better still adn asks to change back;  unfortunately has gained some wt without OSA symptoms;  has recurrent low back spasms that cause him trouble getting to sleep - asks for alprazolam for spasms and sleep at night only; Pt denies CP, sob, doe, wheezing, orthopnea, pnd, worsening LE edema, palps, dizziness or syncope Pt denies new neuro symptoms such as headache, facial or extremity weakness   Did see GI with egd, colonsocpy, and CT abd with finding of divertiuculosis, and esoph varices /portal HTN. No overt bleeding or other GI symtpoms.     Problems Prior to Update: 1)  Chronic Pain Syndrome  (ICD-338.4) 2)  Preventive Health Care  (ICD-V70.0) 3)  Thumb Pain, Left  (ICD-729.5) 4)  Fever Unspecified  (ICD-780.60) 5)  Sinusitis- Acute-nos  (ICD-461.9) 6)  Nephrolithiasis, Hx of  (ICD-V13.01) 7)  Special Screening Malig Neoplasms Other Sites  (ICD-V76.49) 8)  Fatigue  (ICD-780.79) 9)  Benign Prostatic Hypertrophy  (ICD-600.00) 10)  Seizure Disorder  (ICD-780.39) 11)  Allergic Rhinitis  (ICD-477.9) 12)  Low Back Pain  (ICD-724.2) 13)  Hyperlipidemia  (ICD-272.4) 14)  Hepatitis C  (ICD-070.51) 15)  Gerd  (ICD-530.81) 16)  Adhd  (ICD-314.01) 17)  Bipolar Affective Disorder  (ICD-296.80) 18)  Depression  (ICD-311)  Medications Prior to Update: 1)  Venlafaxine Hcl 225 Mg Xr24h-Tab (Venlafaxine Hcl) .Marland Kitchen.. 1po Once Daily 2)  Adderall Xr 20 Mg   Cp24 (Amphetamine-Dextroamphetamine) .Marland Kitchen.. 1 By Mouth Once Daily - To Fill Mar 19, 2009 3)  Doxazosin Mesylate 2 Mg Tabs (Doxazosin Mesylate) .Marland Kitchen.. 1 By Mouth At Bedtime As Needed 4)  Hydrocodone-Acetaminophen 10-325 Mg Tabs (Hydrocodone-Acetaminophen) .... Take 1 Tablet By Mouth Four Times A Day As Needed - To Fill Nov 17,2010 5)  Morphine Sulfate 30 Mg Tabs (Morphine Sulfate) .Marland Kitchen.. 1po Two Times A Day  - To Fill Apr 13, 2009 6)  Gabapentin 300 Mg Caps (Gabapentin) .... 4 By Mouth Once Daily  Current Medications (verified): 1)  Pristiq 50 Mg Xr24h-Tab (Desvenlafaxine Succinate) .Marland Kitchen.. 1 By Mouth Once Daily 2)  Adderall Xr 20 Mg  Cp24 (Amphetamine-Dextroamphetamine) .Marland Kitchen.. 1 By Mouth Once Daily - To Fill Jun 17, 2009 3)  Doxazosin Mesylate 2 Mg Tabs (Doxazosin Mesylate) .Marland Kitchen.. 1 By Mouth At Bedtime As Needed 4)  Hydrocodone-Acetaminophen 10-325 Mg Tabs (Hydrocodone-Acetaminophen) .... Take 1 Tablet By Mouth Four Times A Day As Needed - To Fill Nov 17,2010 5)  Morphine Sulfate Cr 30 Mg Xr12h-Tab (Morphine Sulfate) .Marland Kitchen.. 1 By Mouth Two Times A Day  - To Fill Jul 12, 2009 6)  Gabapentin 300 Mg Caps (Gabapentin) .... 4 By Mouth Once Daily 7)  Alprazolam 0.5 Mg Tabs (Alprazolam) .Marland Kitchen.. 1po At Bedtime As Needed  Sleep  Allergies (verified): 1)  ! Sulfa 2)  ! * Mercury 3)  ! * Latex 4)  ! * Shellfish  Past History:  Social History: Last updated: 11/02/2007 Married 3 children work - used to work for [employer], now disabled due to back pain Current Smoker Alcohol use-no  Risk Factors: Smoking Status: current (11/02/2007)  Past Medical History: Depression Bipolar Disoder ADHD GERD Hepatitis C Hyperlipidemia, mild Low back pain, chronic Allergic rhinitis Seizure disorder, H/O Benign prostatic hypertrophy migraine hx of ETOH abuse Nephrolithiasis, hx of recurrent sinusitis portal vein HTN with esoph varices/gastropathy Diverticulosis, colon Anxiety  Past Surgical  History: Reviewed history from 01/18/2009 and no changes required. Back Surgery x 5 Knee Surgery x 1 hx of fx cheekbone Tonsillectomy s/p sinus surgury aug 2010 - dr Zara Chess  Review of Systems       all otherwise negative per pt -   Physical Exam  General:  alert and overweight-appearing.   Head:  normocephalic and atraumatic.   Eyes:  vision grossly intact, pupils equal, and pupils round.   Ears:  R ear normal and L ear normal.   Nose:  no external deformity and no nasal discharge.   Mouth:  no gingival abnormalities and pharynx pink and moist.   Neck:  supple and no masses.   Lungs:  normal respiratory effort and normal breath sounds.   Heart:  normal rate and regular rhythm.   Abdomen:  soft, non-tender, and normal bowel sounds.   Msk:  spine nontender, no paravertebral tender Extremities:  no edema, no erythema  Neurologic:  cranial nerves II-XII intact and strength normal in lower extremities.     Impression & Recommendations:  Problem # 1:  CHRONIC PAIN SYNDROME (ICD-338.4) stable overall by hx and exam, ok to continue meds/tx as is - for pain med refills today  Problem # 2:  INSOMNIA-SLEEP DISORDER-UNSPEC (ICD-780.52) treat as above, f/u any worsening signs or symptoms  - see emr - alprazolam  Complete Medication List: 1)  Pristiq 50 Mg Xr24h-tab (Desvenlafaxine succinate) .Marland Kitchen.. 1 by mouth once daily 2)  Adderall Xr 20 Mg Cp24 (Amphetamine-dextroamphetamine) .Marland Kitchen.. 1 by mouth once daily - to fill Jun 17, 2009 3)  Doxazosin Mesylate 2 Mg Tabs (Doxazosin mesylate) .Marland Kitchen.. 1 by mouth at bedtime as needed 4)  Hydrocodone-acetaminophen 10-325 Mg Tabs (Hydrocodone-acetaminophen) .... Take 1 tablet by mouth four times a day as needed - to fill nov 17,2010 5)  Morphine Sulfate Cr 30 Mg Xr12h-tab (Morphine sulfate) .Marland Kitchen.. 1 by mouth two times a day  - to fill Jul 12, 2009 6)  Gabapentin 300 Mg Caps (Gabapentin) .... 4 by mouth once daily 7)  Alprazolam 0.5 Mg Tabs (Alprazolam)  .Marland Kitchen.. 1po at bedtime as needed sleep  Patient Instructions: 1)  your pristiq was sent to the pharmacy on the computer 2)  start the alprazolam for the back spasms and sleep in the evening as needed 3)  you are given the refills on the morphine - remember you are not due for next refill until mar 12 4)  you are given the refills on the adderall as well 5)  Please schedule a follow-up appointment in 6 months. 6)  please call when you need the next refills Prescriptions: ADDERALL XR 20 MG  CP24 (AMPHETAMINE-DEXTROAMPHETAMINE) 1 by mouth once daily - to fill Jun 17, 2009  #30 x 0   Entered and Authorized by:   [name] MD   Signed by:   Len Blalock  Agastya Meister MD on 04/19/2009   Method used:   Print then Give to Patient   RxID:   [id number] ADDERALL XR 20 MG  CP24 (AMPHETAMINE-DEXTROAMPHETAMINE) 1 by mouth once daily - to fill May 18, 2009  #30 x 0   Entered and Authorized by:   [name] MD   Signed by:   [name] MD on 04/19/2009   Method used:   Print then Give to Patient   RxID:   [id number] ADDERALL XR 20 MG  CP24 (AMPHETAMINE-DEXTROAMPHETAMINE) 1 by mouth once daily - to fill Apr 18, 2009  #30 x 0   Entered and Authorized by:   [name] MD   Signed by:   [name] MD on 04/19/2009   Method used:   Print then Give to Patient   RxID:   [id number] MORPHINE SULFATE CR 30 MG XR12H-TAB (MORPHINE SULFATE) 1 by mouth two times a day  - to fill Jul 12, 2009  #60 x 0   Entered and Authorized by:   [name] MD   Signed by:   [name] MD on 04/19/2009   Method used:   Print then Give to Patient   RxID:   [id number] MORPHINE SULFATE CR 30 MG XR12H-TAB (MORPHINE SULFATE) 1 by mouth two times a day  - to fill Jun 12, 2009  #60 x 0   Entered and Authorized by:   [name] MD   Signed by:   [name] MD on 04/19/2009   Method used:   Print then Give to Patient   RxID:   [id number] MORPHINE SULFATE CR 30 MG XR12H-TAB (MORPHINE SULFATE) 1 by mouth  two times a day  - to fill May 13, 2009  #60 x 0   Entered and Authorized by:   [name] MD   Signed by:   [name] MD on 04/19/2009   Method used:   Print then Give to Patient   RxID:   [id number] ALPRAZOLAM 0.5 MG TABS (ALPRAZOLAM) 1po at bedtime as needed sleep  #30 x 2   Entered and Authorized by:   [name] MD   Signed by:   [name] MD on 04/19/2009   Method used:   Print then Give to Patient   RxID:   [id number] PRISTIQ 50 MG XR24H-TAB (DESVENLAFAXINE SUCCINATE) 1 by mouth once daily  #30 x 11   Entered and Authorized by:   [name] MD   Signed by:   [name] MD on 04/19/2009   Method used:   Electronically to        [facility]  [address] [phone][facility] (retail)       [address]       [place], [place]  [address]       Ph: [phone] or [phone]       Fax: [phone]   RxID:   [id number]

## 2010-04-05 NOTE — Progress Notes (Signed)
Summary: Rx req  Phone Note Call from Patient Call back at Home Phone [phone]   Caller: Patient Summary of Call: Pt called requesting Xanax be replaced with Soma Pt is also requesting Morphine (due 06/11) and Adderall (due 06/20) refills. Initial call taken by: Margaret Pyle, CMA,  August 09, 2009 11:49 AM  Follow-up for Phone Call        done hardcopy to [facility] side B - dahlia    Additional Follow-up for Phone Call Additional follow up Details #1::        Pt informed, Rx in cabinet for pt pick up Additional Follow-up by: Margaret Pyle, CMA,  August 09, 2009 1:19 PM    New/Updated Medications: ADDERALL XR 20 MG  CP24 (AMPHETAMINE-DEXTROAMPHETAMINE) 1 by mouth once daily - to fill Oct 19, 2009 ADDERALL XR 20 MG  CP24 (AMPHETAMINE-DEXTROAMPHETAMINE) 1 by mouth once daily - to fill August 20, 2009 ADDERALL XR 20 MG  CP24 (AMPHETAMINE-DEXTROAMPHETAMINE) 1 by mouth once daily - to fill September 19, 2009 MORPHINE SULFATE CR 30 MG XR12H-TAB (MORPHINE SULFATE) 1 by mouth two times a day  - to fill September 10, 2009 MORPHINE SULFATE CR 30 MG XR12H-TAB (MORPHINE SULFATE) 1 by mouth two times a day  - to fill August 11, 2009 MORPHINE SULFATE CR 30 MG XR12H-TAB (MORPHINE SULFATE) 1 by mouth two times a day  - to fill Oct 10, 2009 CARISOPRODOL 350 MG TABS (CARISOPRODOL) 1 by mouth at bedtime Prescriptions: MORPHINE SULFATE CR 30 MG XR12H-TAB (MORPHINE SULFATE) 1 by mouth two times a day  - to fill Oct 10, 2009  #60 x 0   Entered and Authorized by:   [name] MD   Signed by:   [name] MD on 08/09/2009   Method used:   Print then Give to Patient   RxID:   [id number] MORPHINE SULFATE CR 30 MG XR12H-TAB (MORPHINE SULFATE) 1 by mouth two times a day  - to fill September 10, 2009  #60 x 0   Entered and Authorized by:   [name] MD   Signed by:   [name] MD on 08/09/2009   Method used:   Print then Give to Patient   RxID:   [id number] MORPHINE SULFATE CR 30 MG XR12H-TAB  (MORPHINE SULFATE) 1 by mouth two times a day  - to fill August 11, 2009  #60 x 0   Entered and Authorized by:   [name] MD   Signed by:   [name] MD on 08/09/2009   Method used:   Print then Give to Patient   RxID:   [id number] ADDERALL XR 20 MG  CP24 (AMPHETAMINE-DEXTROAMPHETAMINE) 1 by mouth once daily - to fill Oct 19, 2009  #30 x 0   Entered and Authorized by:   [name] MD   Signed by:   [name] MD on 08/09/2009   Method used:   Print then Give to Patient   RxID:   [id number] ADDERALL XR 20 MG  CP24 (AMPHETAMINE-DEXTROAMPHETAMINE) 1 by mouth once daily - to fill September 19, 2009  #30 x 0   Entered and Authorized by:   [name] MD   Signed by:   [name] MD on 08/09/2009   Method used:   Print then Give to Patient   RxID:   [id number] ADDERALL XR 20 MG  CP24 (AMPHETAMINE-DEXTROAMPHETAMINE) 1 by mouth once daily - to fill August 20, 2009  #  30 x 0   Entered and Authorized by:   [name] MD   Signed by:   [name] MD on 08/09/2009   Method used:   Print then Give to Patient   RxID:   [id number] CARISOPRODOL 350 MG TABS (CARISOPRODOL) 1 by mouth at bedtime  #30 x 5   Entered and Authorized by:   [name] MD   Signed by:   [name] MD on 08/09/2009   Method used:   Print then Give to Patient   RxID:   [id number]

## 2010-04-05 NOTE — Letter (Signed)
Summary: [facility]  [facility]   Imported By: Lennie Odor 05/03/2009 15:18:37  _____________________________________________________________________  External Attachment:    Type:   Image     Comment:   External Document

## 2010-04-05 NOTE — Assessment & Plan Note (Signed)
Summary: rx consult-lb   Vital Signs:  Patient profile:   60 year old male Height:      73 inches Weight:      214.50 pounds BMI:     28.40 O2 Sat:      97 % on Room air Temp:     100 degrees F oral Pulse rate:   109 / minute BP sitting:   138 / 82  (left arm) Cuff size:   large  Vitals Entered By: Zella Ball Ewing CMA Duncan Dull) (February 27, 2010 2:47 PM)  O2 Flow:  Room air  Preventive Care Screening     decliens tetanus and colonoscopy  CC: Refill consult/RE   Primary Care Provider:  [name] MD  CC:  Refill consult/RE.  History of Present Illness: here after pt admits to inapprop use of his last rx of narcotic in that he has been overusing, after being more active physically. Ran out early, and when denied early refill he occasioned mild w/d symtpoms for which he was seen in the ER and d/c with limited rx of Morphine Sulfate for 2 days. Pt states pain overall controlled,  family here with pt who agrees to admin his pain meds for him.    Pt denies CP, worsening sob, doe, wheezing, orthopnea, pnd, worsening LE edema, palps, dizziness or syncope Pt denies new neuro symptoms such as headache, facial or extremity weakness  Pt denies polydipsia, polyuria   Overall o/w good compliance with meds, trying to follow low chol, diiet, wt stable, little excercise however  Denies worsening depressive symptoms, suicidal ideation, or panic.   No fever, wt loss, night sweats, loss of appetite or other constitutional symptoms  Pt states good ability with ADL's, low fall risk, home safety reviewed and adequate, no significant change in hearing or vision, trying to follow lower chol diet, and occasionally active only with regular excercise.   Problems Prior to Update: 1)  Sinusitis- Acute-nos  (ICD-461.9) 2)  Chest Pain  (ICD-786.50) 3)  Insomnia-sleep Disorder-unspec  (ICD-780.52) 4)  Anxiety  (ICD-300.00) 5)  Diverticulosis, Colon  (ICD-562.10) 6)  Esophageal Varices  (ICD-456.1) 7)  Chronic  Pain Syndrome  (ICD-338.4) 8)  Preventive Health Care  (ICD-V70.0) 9)  Thumb Pain, Left  (ICD-729.5) 10)  Fever Unspecified  (ICD-780.60) 11)  Sinusitis- Acute-nos  (ICD-461.9) 12)  Nephrolithiasis, Hx of  (ICD-V13.01) 13)  Special Screening Malig Neoplasms Other Sites  (ICD-V76.49) 14)  Fatigue  (ICD-780.79) 15)  Benign Prostatic Hypertrophy  (ICD-600.00) 16)  Seizure Disorder  (ICD-780.39) 17)  Allergic Rhinitis  (ICD-477.9) 18)  Low Back Pain  (ICD-724.2) 19)  Hyperlipidemia  (ICD-272.4) 20)  Hepatitis C  (ICD-070.51) 21)  Gerd  (ICD-530.81) 22)  Adhd  (ICD-314.01) 23)  Bipolar Affective Disorder  (ICD-296.80) 24)  Depression  (ICD-311)  Medications Prior to Update: 1)  Pristiq 50 Mg Xr24h-Tab (Desvenlafaxine Succinate) .Marland Kitchen.. 1 By Mouth Once Daily 2)  Adderall Xr 10 Mg Xr24h-Cap (Amphetamine-Dextroamphetamine) .Marland Kitchen.. 1 By Mouth Once Daily  - To Fill Apr 02, 2010 3)  Hydrocodone-Acetaminophen 7.5-325 Mg Tabs (Hydrocodone-Acetaminophen) .Marland Kitchen.. 1 By Mouth Q 6 Hrs As Needed Pain - To Fill Jan 09, 2010 4)  Morphine Sulfate Cr 30 Mg Xr12h-Tab (Morphine Sulfate) .Marland Kitchen.. 1 By Mouth Two Times A Day  - To Fill Apr 02, 2010 5)  Gabapentin 300 Mg Caps (Gabapentin) .Marland Kitchen.. 1 By Mouth Four Times Per Day 6)  Carisoprodol 350 Mg Tabs (Carisoprodol) .Marland Kitchen.. 1 By Mouth  Two Times A Day  As Needed 7)  Ondansetron Hcl 4 Mg Tabs (Ondansetron Hcl) .Marland Kitchen.. 1 By Mouth Every 8 Hours As Needed For Nausea and Vomiting 8)  Aspir-Low 81 Mg Tbec (Aspirin) .Marland Kitchen.. 1 By Mouth Once Daily  Current Medications (verified): 1)  Pristiq 50 Mg Xr24h-Tab (Desvenlafaxine Succinate) .Marland Kitchen.. 1 By Mouth Once Daily 2)  Adderall Xr 10 Mg Xr24h-Cap (Amphetamine-Dextroamphetamine) .Marland Kitchen.. 1 By Mouth Once Daily  - To Fill Apr 02, 2010 3)  Hydrocodone-Acetaminophen 7.5-325 Mg Tabs (Hydrocodone-Acetaminophen) .Marland Kitchen.. 1 By Mouth Q 6 Hrs As Needed Pain - To Fill Feb 27, 2010 4)  Morphine Sulfate Cr 30 Mg Xr12h-Tab (Morphine Sulfate) .Marland Kitchen.. 1 By Mouth Two Times A  Day  - To Fill Feb 27, 2010 5)  Gabapentin 300 Mg Caps (Gabapentin) .Marland Kitchen.. 1 By Mouth Four Times Per Day 6)  Carisoprodol 350 Mg Tabs (Carisoprodol) .Marland Kitchen.. 1 By Mouth  Two Times A Day As Needed 7)  Ondansetron Hcl 4 Mg Tabs (Ondansetron Hcl) .Marland Kitchen.. 1 By Mouth Every 8 Hours As Needed For Nausea and Vomiting 8)  Aspir-Low 81 Mg Tbec (Aspirin) .Marland Kitchen.. 1 By Mouth Once Daily  Allergies (verified): 1)  ! Sulfa 2)  ! * Mercury 3)  ! * Latex 4)  ! * Shellfish 5)  * Cymbalta  Past History:  Past Medical History: Last updated: 04/19/2009 Depression Bipolar Disoder ADHD GERD Hepatitis C Hyperlipidemia, mild Low back pain, chronic Allergic rhinitis Seizure disorder, H/O Benign prostatic hypertrophy migraine hx of ETOH abuse Nephrolithiasis, hx of recurrent sinusitis portal vein HTN with esoph varices/gastropathy Diverticulosis, colon Anxiety  Past Surgical History: Last updated: 01/18/2009 Back Surgery x 5 Knee Surgery x 1 hx of fx cheekbone Tonsillectomy s/p sinus surgury aug 2010 - dr Zara Chess  Family History: Last updated: 11/02/2007 daughter with asthma mother with stroke, HTN, elev cholesterol, depression father died with dementia  Social History: Last updated: 01/02/2010 Married 3 children work - used to work for [employer], now disabled due to back pain Current Smoker Alcohol use-no Drug use-no  Risk Factors: Smoking Status: current (11/02/2007)  Review of Systems  The patient denies anorexia, fever, vision loss, decreased hearing, hoarseness, chest pain, syncope, dyspnea on exertion, peripheral edema, prolonged cough, headaches, hemoptysis, abdominal pain, melena, hematochezia, severe indigestion/heartburn, hematuria, muscle weakness, suspicious skin lesions, transient blindness, difficulty walking, unusual weight change, abnormal bleeding, enlarged lymph nodes, and angioedema.         all otherwise negative per pt -    Physical Exam  General:   overweight-appearing. Head:  normocephalic and atraumatic.   Eyes:  vision grossly intact, pupils equal, and pupils round.   Ears:  R ear normal and L ear normal.   Nose:  no external deformity and no nasal discharge.   Mouth:  no gingival abnormalities and pharynx pink and moist.   Neck:  supple and no masses.   Lungs:  normal respiratory effort and normal breath sounds.   Heart:  normal rate and regular rhythm.   Abdomen:  soft, non-tender, and normal bowel sounds.   Msk:  no joint tenderness and no joint swelling.  , spine nontender Extremities:  no edema, no erythema  Neurologic:  cranial nerves II-XII intact, strength normal in all extremities, and gait normal.   Skin:  color normal and no rashes.     Impression & Recommendations:  Problem # 1:  CHRONIC PAIN SYNDROME (ICD-338.4)  pt with clear overuse of meds and subsequent withdrawal, better now with limited meds from [facility];  ok for limited refills until next "regular" refills dates, and refer [facility] pain clinic per pt request;  family also enlisted to dispense meds and pt agrees to their ongoing oversight  Orders: Pain Clinic Referral (Pain)  Problem # 2:  Preventive Health Care (ICD-V70.0) Overall doing well, age appropriate education and counseling updated, referral for preventive services and immunizations addressed, dietary counseling and smoking status adressed , most recent labs reviewed I have personally reviewed and have noted 1.The patient's medical and social history 2.Their use of alcohol, tobacco or illicit drugs 3.Their current medications and supplements 4. Functional ability including ADL's, fall risk, home safety risk, hearing & visual impairment  5.Diet and physical activities 6.Evidence for depression or mood disorders The patients weight, height, BMI  have been recorded in the chart I have made referrals, counseling and provided education to the patient based review of the above   Complete  Medication List: 1)  Pristiq 50 Mg Xr24h-tab (Desvenlafaxine succinate) .Marland Kitchen.. 1 by mouth once daily 2)  Adderall Xr 10 Mg Xr24h-cap (Amphetamine-dextroamphetamine) .Marland Kitchen.. 1 by mouth once daily  - to fill Apr 02, 2010 3)  Hydrocodone-acetaminophen 7.5-325 Mg Tabs (Hydrocodone-acetaminophen) .Marland Kitchen.. 1 by mouth q 6 hrs as needed pain - to fill Feb 27, 2010 4)  Morphine Sulfate Cr 30 Mg Xr12h-tab (Morphine sulfate) .Marland Kitchen.. 1 by mouth two times a day  - to fill Feb 27, 2010 5)  Gabapentin 300 Mg Caps (Gabapentin) .Marland Kitchen.. 1 by mouth four times per day 6)  Carisoprodol 350 Mg Tabs (Carisoprodol) .Marland Kitchen.. 1 by mouth  two times a day as needed 7)  Ondansetron Hcl 4 Mg Tabs (Ondansetron hcl) .Marland Kitchen.. 1 by mouth every 8 hours as needed for nausea and vomiting 8)  Aspir-low 81 Mg Tbec (Aspirin) .Marland Kitchen.. 1 by mouth once daily  Patient Instructions: 1)  Continue all previous medications as before this visit  - you are given the "extra" morphine for the dec 28-31 time frame, as well as the "extra" hydrocodone for the dec 27-jan 5 time frame 2)  You should allow your family to administer all pain medications at every dose 3)  You will be contacted about the referral(s) to: Pain Clinic 4)  Please schedule a follow-up appointment as needed. Prescriptions: PRISTIQ 50 MG XR24H-TAB (DESVENLAFAXINE SUCCINATE) 1 by mouth once daily  #90 x 3   Entered and Authorized by:   [name] MD   Signed by:   [name] MD on 02/27/2010   Method used:   [facility] to        [facility]  [address] [phone]* (retail)       [address]       [place], [place]  [address]       Ph: [phone] or [phone]       Fax: [phone]   RxID:   [id number] PRISTIQ 50 MG XR24H-TAB (DESVENLAFAXINE SUCCINATE) 1 by mouth once daily  #90 x 3   Entered and Authorized by:   [name] MD   Signed by:   [name] MD on 02/27/2010   Method used:   Print then Give to Patient   RxID:   [id number] HYDROCODONE-ACETAMINOPHEN 7.5-325  MG TABS (HYDROCODONE-ACETAMINOPHEN) 1 by mouth q 6 hrs as needed pain - to fill Feb 27, 2010  #40 x 0   Entered and Authorized by:   [name] MD   Signed by:   Len Blalock  John MD on 02/27/2010   Method used:   Print then Give to Patient   RxID:   [id number] HYDROCODONE-ACETAMINOPHEN 7.5-325 MG TABS (HYDROCODONE-ACETAMINOPHEN) 1 by mouth q 6 hrs as needed pain - to fill Jan 09, 2010  #40 x 0   Entered and Authorized by:   [name] MD   Signed by:   [name] MD on 02/27/2010   Method used:   Print then Give to Patient   RxID:   [id number] MORPHINE SULFATE CR 30 MG XR12H-TAB (MORPHINE SULFATE) 1 by mouth two times a day  - to fill Feb 27, 2010  #10 x 0   Entered and Authorized by:   [name] MD   Signed by:   [name] MD on 02/27/2010   Method used:   Print then Give to Patient   RxID:   [id number]    Orders Added: 1)  Pain Clinic Referral [Pain] 2)  Est. Patient 40-64 years [[id number]]

## 2010-04-05 NOTE — Progress Notes (Signed)
Summary: Rx request  Phone Note Call from Patient Call back at Home Phone [phone]   Caller: Patient Summary of Call: pt called requesting refill of Oxycodone and Morphine. Pt has called twice before requesting refill of Morphine and I advised him that he was given Rx. Pt states that he lost Rx. please advise Initial call taken by: Margaret Pyle, CMA,  Jul 07, 2009 1:37 PM  Follow-up for Phone Call        no, too soon for both;  pt has to be responsible for his pain meds and prescriptions Follow-up by: [name] MD,  Jul 07, 2009 3:37 PM  Additional Follow-up for Phone Call Additional follow up Details #1::        Patient notified. Additional Follow-up by: Lucious Groves,  Jul 07, 2009 4:01 PM     Appended Document: Rx request pt called again requesting refill of Morphine sulfate. Pt advised that [facility] does not replace lost Narcotic Rxs. Pt then stated that he does not remember getting rx, I reminder pt that the last time we spoke, he said that he misplaced Rx. Pt said that he never spoke to anyone at our office but that he would look for Rx again.

## 2010-04-05 NOTE — Procedures (Signed)
Summary: EGD/[facility]  EGD/[facility]   Imported By: Sherian Rein 04/06/2009 14:35:22  _____________________________________________________________________  External Attachment:    Type:   Image     Comment:   External Document

## 2010-04-05 NOTE — Assessment & Plan Note (Signed)
Summary: FU   MED   STC   Vital Signs:  Patient profile:   60 year old male Height:      73 inches Weight:      211.75 pounds BMI:     28.04 O2 Sat:      96 % on Room air Temp:     99.1 degrees F oral Pulse rate:   107 / minute BP sitting:   130 / 80  (left arm) Cuff size:   large  Vitals Entered By: Zella Ball Ewing CMA (AAMA) (January 02, 2010 10:20 AM)  O2 Flow:  Room air  CC: Followup visit/RE   Primary Care Jolinda Pinkstaff:  [name] MD  CC:  Followup visit/RE.  History of Present Illness: here to f/u with acute c/o onset 2 days severe sT, with fever, headache, general weakness and malaise, without cough and Pt denies CP, worsening sob, doe, wheezing, orthopnea, pnd, worsening LE edema, palps, dizziness or syncope  Pt denies new neuro symptoms such as headache, facial or extremity weakness  No recent wt loss, night sweats, loss of appetite or other constitutional symptoms.  Pt denies polydipsia, polyuria.  Overall good compliance with meds, trying to follow low chol  diet, wt stable, little excercise however.  Denies worsening depressive symptoms, suicidal ideation, or panic, but asks for wellbutrin vs cymbalta for pain and depression as current meds not enough for pain.  Has ongoing chronic LBP without change in freq, severity, bowel or bladder change, LE pain/weak/numb but asks for two times a day soma instead of at bedtime only.  Also not taking the doxasoxin - no longer needs per pt.  Adderall cont's to work well without significant S/E  Preventive Screening-Counseling & Management      Drug Use:  no.    Problems Prior to Update: 1)  Sinusitis- Acute-nos  (ICD-461.9) 2)  Chest Pain  (ICD-786.50) 3)  Insomnia-sleep Disorder-unspec  (ICD-780.52) 4)  Anxiety  (ICD-300.00) 5)  Diverticulosis, Colon  (ICD-562.10) 6)  Esophageal Varices  (ICD-456.1) 7)  Chronic Pain Syndrome  (ICD-338.4) 8)  Preventive Health Care  (ICD-V70.0) 9)  Thumb Pain, Left  (ICD-729.5) 10)  Fever  Unspecified  (ICD-780.60) 11)  Sinusitis- Acute-nos  (ICD-461.9) 12)  Nephrolithiasis, Hx of  (ICD-V13.01) 13)  Special Screening Malig Neoplasms Other Sites  (ICD-V76.49) 14)  Fatigue  (ICD-780.79) 15)  Benign Prostatic Hypertrophy  (ICD-600.00) 16)  Seizure Disorder  (ICD-780.39) 17)  Allergic Rhinitis  (ICD-477.9) 18)  Low Back Pain  (ICD-724.2) 19)  Hyperlipidemia  (ICD-272.4) 20)  Hepatitis C  (ICD-070.51) 21)  Gerd  (ICD-530.81) 22)  Adhd  (ICD-314.01) 23)  Bipolar Affective Disorder  (ICD-296.80) 24)  Depression  (ICD-311)  Medications Prior to Update: 1)  Pristiq 50 Mg Xr24h-Tab (Desvenlafaxine Succinate) .Marland Kitchen.. 1 By Mouth Once Daily 2)  Adderall Xr 10 Mg Xr24h-Cap (Amphetamine-Dextroamphetamine) .Marland Kitchen.. 1 By Mouth Once Daily  - To Fill Dec 11, 2009 3)  Doxazosin Mesylate 2 Mg Tabs (Doxazosin Mesylate) .Marland Kitchen.. 1 By Mouth At Bedtime As Needed 4)  Hydrocodone-Acetaminophen 7.5-325 Mg Tabs (Hydrocodone-Acetaminophen) .Marland Kitchen.. 1 By Mouth Q 6 Hrs As Needed Pain 5)  Morphine Sulfate Cr 30 Mg Xr12h-Tab (Morphine Sulfate) .Marland Kitchen.. 1 By Mouth Two Times A Day  - To Fill Dec 11, 2009 6)  Gabapentin 300 Mg Caps (Gabapentin) .Marland Kitchen.. 1 By Mouth Qid 7)  Carisoprodol 350 Mg Tabs (Carisoprodol) .Marland Kitchen.. 1 By Mouth At Bedtime 8)  Prednisone 10 Mg Tabs (Prednisone) .... 3po Qd For 3days,  Then 2po Qd For 3days, Then 1po Qd For 3days, Then Stop  Current Medications (verified): 1)  Cymbalta 60 Mg Cpep (Duloxetine Hcl) .Marland Kitchen.. 1po Once Daily 2)  Adderall Xr 10 Mg Xr24h-Cap (Amphetamine-Dextroamphetamine) .Marland Kitchen.. 1 By Mouth Once Daily  - To Fill Apr 02, 2010 3)  Hydrocodone-Acetaminophen 7.5-325 Mg Tabs (Hydrocodone-Acetaminophen) .Marland Kitchen.. 1 By Mouth Q 6 Hrs As Needed Pain - To Fill Jan 09, 2010 4)  Morphine Sulfate Cr 30 Mg Xr12h-Tab (Morphine Sulfate) .Marland Kitchen.. 1 By Mouth Two Times A Day  - To Fill Apr 02, 2010 5)  Gabapentin 300 Mg Caps (Gabapentin) .Marland Kitchen.. 1 By Mouth Four Times Per Day 6)  Carisoprodol 350 Mg Tabs (Carisoprodol) .Marland Kitchen.. 1  By Mouth  Two Times A Day As Needed 7)  Ondansetron Hcl 4 Mg Tabs (Ondansetron Hcl) .Marland Kitchen.. 1 By Mouth Every 8 Hours As Needed For Nausea and Vomiting 8)  Aspir-Low 81 Mg Tbec (Aspirin) .Marland Kitchen.. 1 By Mouth Once Daily 9)  Azithromycin 250 Mg Tabs (Azithromycin) .... 2po Qd For 1 Day, Then 1po Qd For 4days, Then Stop  Allergies (verified): 1)  ! Sulfa 2)  ! * Mercury 3)  ! * Latex 4)  ! * Shellfish  Past History:  Past Medical History: Last updated: 04/19/2009 Depression Bipolar Disoder ADHD GERD Hepatitis C Hyperlipidemia, mild Low back pain, chronic Allergic rhinitis Seizure disorder, H/O Benign prostatic hypertrophy migraine hx of ETOH abuse Nephrolithiasis, hx of recurrent sinusitis portal vein HTN with esoph varices/gastropathy Diverticulosis, colon Anxiety  Past Surgical History: Last updated: 01/18/2009 Back Surgery x 5 Knee Surgery x 1 hx of fx cheekbone Tonsillectomy s/p sinus surgury aug 2010 - dr Zara Chess  Social History: Last updated: 01/02/2010 Married 3 children work - used to work for [employer], now disabled due to back pain Current Smoker Alcohol use-no Drug use-no  Risk Factors: Smoking Status: current (11/02/2007)  Social History: Reviewed history from 11/02/2007 and no changes required. Married 3 children work - used to work for [employer], now disabled due to back pain Current Smoker Alcohol use-no Drug use-no Drug Use:  no  Review of Systems       all otherwise negative per pt -    Physical Exam  General:  overweight-appearing.  , mild ill  Head:  normocephalic and atraumatic.   Eyes:  vision grossly intact, pupils equal, and pupils round.   Ears:  bilat tm's mild red, sinus tender bilat Nose:  nasal dischargemucosal pallor and mucosal edema.   Mouth:  pharyngeal erythema and fair dentition.   Neck:  supple and no masses.   Lungs:  normal respiratory effort and normal breath sounds.   Heart:  normal rate and  regular rhythm.   Msk:  no joint tenderness and no joint swelling.  , spine nontender Extremities:  no edema, no erythema  Neurologic:  cranial nerves II-XII intact, strength normal in all extremities, and gait normal.     Impression & Recommendations:  Problem # 1:  SINUSITIS- ACUTE-NOS (ICD-461.9)  His updated medication list for this problem includes:    Azithromycin 250 Mg Tabs (Azithromycin) .Marland Kitchen... 2po qd for 1 day, then 1po qd for 4days, then stop treat as above, f/u any worsening signs or symptoms   Problem # 2:  LOW BACK PAIN (ICD-724.2)  His updated medication list for this problem includes:    Hydrocodone-acetaminophen 7.5-325 Mg Tabs (Hydrocodone-acetaminophen) .Marland Kitchen... 1 by mouth q 6 hrs as needed pain - to fill Jan 09, 2010  Morphine Sulfate Cr 30 Mg Xr12h-tab (Morphine sulfate) .Marland Kitchen... 1 by mouth two times a day  - to fill Apr 02, 2010    Carisoprodol 350 Mg Tabs (Carisoprodol) .Marland Kitchen... 1 by mouth  two times a day as needed    Aspir-low 81 Mg Tbec (Aspirin) .Marland Kitchen... 1 by mouth once daily chronic stable - treat as above, f/u any worsening signs or symptoms   Problem # 3:  CHRONIC PAIN SYNDROME (ICD-338.4) ok to add cymbalta for pain, hopefully to help with anxiety/depression as well  Problem # 4:  ADHD (ICD-314.01) stable overall by hx and exam, ok to continue meds/tx as is  - for med refills today  Complete Medication List: 1)  Cymbalta 60 Mg Cpep (Duloxetine hcl) .Marland Kitchen.. 1po once daily 2)  Adderall Xr 10 Mg Xr24h-cap (Amphetamine-dextroamphetamine) .Marland Kitchen.. 1 by mouth once daily  - to fill Apr 02, 2010 3)  Hydrocodone-acetaminophen 7.5-325 Mg Tabs (Hydrocodone-acetaminophen) .Marland Kitchen.. 1 by mouth q 6 hrs as needed pain - to fill Jan 09, 2010 4)  Morphine Sulfate Cr 30 Mg Xr12h-tab (Morphine sulfate) .Marland Kitchen.. 1 by mouth two times a day  - to fill Apr 02, 2010 5)  Gabapentin 300 Mg Caps (Gabapentin) .Marland Kitchen.. 1 by mouth four times per day 6)  Carisoprodol 350 Mg Tabs (Carisoprodol) .Marland Kitchen.. 1 by mouth  two  times a day as needed 7)  Ondansetron Hcl 4 Mg Tabs (Ondansetron hcl) .Marland Kitchen.. 1 by mouth every 8 hours as needed for nausea and vomiting 8)  Aspir-low 81 Mg Tbec (Aspirin) .Marland Kitchen.. 1 by mouth once daily 9)  Azithromycin 250 Mg Tabs (Azithromycin) .... 2po qd for 1 day, then 1po qd for 4days, then stop  Patient Instructions: 1)  stop the pristiq 2)  start the cymbalta at 30 mg per day for 1 wk, then 60 mg per day after that 3)  Please take all new medications as prescribed  - the antibiotic 4)  Continue all previous medications as before this visit , except ok to increase the carisoprodol to two times a day as needed  5)  You are given all the refills today 6)  Please schedule a follow-up appointment as you have planned Prescriptions: AZITHROMYCIN 250 MG TABS (AZITHROMYCIN) 2po qd for 1 day, then 1po qd for 4days, then stop  #6 x 1   Entered and Authorized by:   [name] MD   Signed by:   [name] MD on 01/02/2010   Method used:   Print then Give to Patient   RxID:   [id number] HYDROCODONE-ACETAMINOPHEN 7.5-325 MG TABS (HYDROCODONE-ACETAMINOPHEN) 1 by mouth q 6 hrs as needed pain - to fill Jan 09, 2010  #120 x 3   Entered and Authorized by:   [name] MD   Signed by:   [name] MD on 01/02/2010   Method used:   Print then Give to Patient   RxID:   [id number] GABAPENTIN 300 MG CAPS (GABAPENTIN) 1 by mouth four times per day  #120 x 5   Entered and Authorized by:   [name] MD   Signed by:   [name] MD on 01/02/2010   Method used:   Print then Give to Patient   RxID:   [id number] CARISOPRODOL 350 MG TABS (CARISOPRODOL) 1 by mouth  two times a day as needed  #60 x 3   Entered and Authorized by:   [name] MD   Signed by:   Len Blalock  John MD on 01/02/2010   Method used:   Print then Give to Patient   RxID:   [id number] ADDERALL XR 10 MG XR24H-CAP (AMPHETAMINE-DEXTROAMPHETAMINE) 1 by mouth once daily  - to fill Apr 02, 2010  #30 x 0   Entered and  Authorized by:   [name] MD   Signed by:   [name] MD on 01/02/2010   Method used:   Print then Give to Patient   RxID:   [id number] ADDERALL XR 10 MG XR24H-CAP (AMPHETAMINE-DEXTROAMPHETAMINE) 1 by mouth once daily  - to fill Mar 03, 2010  #30 x 0   Entered and Authorized by:   [name] MD   Signed by:   [name] MD on 01/02/2010   Method used:   Print then Give to Patient   RxID:   [id number] ADDERALL XR 10 MG XR24H-CAP (AMPHETAMINE-DEXTROAMPHETAMINE) 1 by mouth once daily  - to fill Feb 01, 2010  #30 x 0   Entered and Authorized by:   [name] MD   Signed by:   [name] MD on 01/02/2010   Method used:   Print then Give to Patient   RxID:   [id number] MORPHINE SULFATE CR 30 MG XR12H-TAB (MORPHINE SULFATE) 1 by mouth two times a day  - to fill Apr 02, 2010  #60 x 0   Entered and Authorized by:   [name] MD   Signed by:   [name] MD on 01/02/2010   Method used:   Print then Give to Patient   RxID:   [id number] MORPHINE SULFATE CR 30 MG XR12H-TAB (MORPHINE SULFATE) 1 by mouth two times a day  - to fill Mar 03, 2010  #60 x 0   Entered and Authorized by:   [name] MD   Signed by:   [name] MD on 01/02/2010   Method used:   Print then Give to Patient   RxID:   [id number] MORPHINE SULFATE CR 30 MG XR12H-TAB (MORPHINE SULFATE) 1 by mouth two times a day  - to fill Feb 01, 2010  #60 x 0   Entered and Authorized by:   [name] MD   Signed by:   [name] MD on 01/02/2010   Method used:   Print then Give to Patient   RxID:   [id number] CYMBALTA 60 MG CPEP (DULOXETINE HCL) 1po once daily  #90 x 3   Entered and Authorized by:   [name] MD   Signed by:   [name] MD on 01/02/2010   Method used:   Print then Give to Patient   RxID:   [id number] CYMBALTA 30 MG CPEP (DULOXETINE HCL) 1 by mouth once daily for 7 days  #7 x 0   Entered and Authorized by:   [name] MD   Signed by:   [name]  MD on 01/02/2010   Method used:   Print then Give to Patient   RxID:   [id number]    Orders Added: 1)  Est. Patient Level IV [27035]

## 2010-04-05 NOTE — Letter (Signed)
Summary: [facility]  [facility]   Imported By: Lester Ghent 06/12/2009 10:20:28  _____________________________________________________________________  External Attachment:    Type:   Image     Comment:   External Document

## 2010-04-05 NOTE — Progress Notes (Signed)
Summary: Adderall/ [facility] pt  Phone Note Call from Patient Call back at Home Phone [phone]   Caller: Patient Summary of Call: pt called requesting refill of Adderall Initial call taken by: Margaret Pyle, CMA,  Jul 21, 2009 11:50 AM  Follow-up for Phone Call        ok - done Follow-up by: Newt Lukes MD,  Jul 21, 2009 12:05 PM  Additional Follow-up for Phone Call Additional follow up Details #1::        pt informed, rx in cabinet for pt pick up Additional Follow-up by: Margaret Pyle, CMA,  Jul 21, 2009 12:52 PM    New/Updated Medications: ADDERALL XR 20 MG  CP24 (AMPHETAMINE-DEXTROAMPHETAMINE) 1 by mouth once daily Prescriptions: ADDERALL XR 20 MG  CP24 (AMPHETAMINE-DEXTROAMPHETAMINE) 1 by mouth once daily  #30 x 0   Entered and Authorized by:   Newt Lukes MD   Signed by:   Newt Lukes MD on 07/21/2009   Method used:   Print then Give to Patient   RxID:   [id number]

## 2010-04-05 NOTE — Medication Information (Signed)
Summary: [facility]  [facility]   Imported By: Sherian Rein 05/19/2009 14:59:33  _____________________________________________________________________  External Attachment:    Type:   Image     Comment:   External Document

## 2010-04-05 NOTE — Progress Notes (Signed)
Summary: Pristiq  Phone Note From Pharmacy   Caller: [facility] [phone] Call For: ID:  [id number]  Summary of Call: PA request--[facility]. They will fax forms. Initial call taken by: Lucious Groves,  May 18, 2009 8:46 AM  Follow-up for Phone Call        Form completed and did not need MD signature. Will wait for insurance company reply. Follow-up by: Lucious Groves,  May 18, 2009 9:08 AM     Appended Document: [facility] Called to check on status and they deny receipt of fax form. Done via phone and approved until 2012.

## 2010-04-05 NOTE — Progress Notes (Signed)
Summary: Morphine  Phone Note Call from Patient Call back at Home Phone [phone]   Caller: Patient Summary of Call: Pt called requsting to have Rx for morphine refilled and dated for 11/30 instead of 12/01.  Initial call taken by: Margaret Pyle, CMA,  January 30, 2010 3:37 PM  Follow-up for Phone Call        ok to verbally authorize pharmacy to refill nov 30 Follow-up by: [name] MD,  January 30, 2010 3:47 PM  Additional Follow-up for Phone Call Additional follow up Details #1::        left message on machine for pt to return my call. Margaret Pyle, CMA  January 30, 2010 4:26 PM  Rx early fill authorized with [facility] [place], Pt informed Additional Follow-up by: Margaret Pyle, CMA,  January 30, 2010 4:37 PM

## 2010-04-05 NOTE — Letter (Signed)
Summary: [facility]  [facility]   Imported By: Lester [place] 09/05/2009 09:25:36  _____________________________________________________________________  External Attachment:    Type:   Image     Comment:   External Document

## 2010-04-05 NOTE — Progress Notes (Signed)
Summary: pt request  Phone Note Call from Patient Call back at Home Phone [phone]   Caller: Patient Summary of Call: pt called stating that he is having increased joint and back pain. pt is requesting Pred pack. I advised pt tht OV may be needed, pt declined. please advise Initial call taken by: Margaret Pyle, CMA,  June 05, 2009 11:40 AM  Follow-up for Phone Call        can try pred pack for now  - done escript; needs OV if not improved or worsens Follow-up by: [name] MD,  June 05, 2009 12:02 PM  Additional Follow-up for Phone Call Additional follow up Details #1::        pt informed Additional Follow-up by: Margaret Pyle, CMA,  June 05, 2009 12:57 PM    New/Updated Medications: PREDNISONE 10 MG TABS (PREDNISONE) 3po qd for 3days, then 2po qd for 3days, then 1po qd for 3days, then stop Prescriptions: PREDNISONE 10 MG TABS (PREDNISONE) 3po qd for 3days, then 2po qd for 3days, then 1po qd for 3days, then stop  #18 x 0   Entered and Authorized by:   [name] MD   Signed by:   [name] MD on 06/05/2009   Method used:   Electronically to        [facility]  [address] [phone][facility] (retail)       [address]       [place], [place]  [address]       Ph: [phone] or [phone]       Fax: [phone]   RxID:   [id number]

## 2010-04-05 NOTE — Assessment & Plan Note (Signed)
Summary: NAUSEA/ WANTED TO COME ON THURS/NWS   Vital Signs:  Patient profile:   60 year old male Height:      73 inches Weight:      220.75 pounds BMI:     29.23 O2 Sat:      94 % on Room air Temp:     99.3 degrees F oral Pulse rate:   111 / minute BP sitting:   140 / 82  (left arm) Cuff size:   large  Vitals Entered By: Zella Ball Ewing CMA Duncan Dull) (October 12, 2009 3:09 PM)  O2 Flow:  Room air CC: Nausea, fatigue/RE   Primary Care Provider:  [name] MD  CC:  Nausea and fatigue/RE.  History of Present Illness: here with recent  n/v for unclear reason for 3 wks, and thinks he has thrown up some of his meds including the morphine;  house gets hot and stomach gets upset, and vomiting ensues,  has also had diarrhea one day tx with imodium which helped but seemed to make him throw up as well; seen in the ER with w/d after taking extra morphine to make up for the ones he threw up and vicodin did not make up for it;  also with myalgias to the joints and extremities;  thinks his recurring nausea may have something to do with the gabapentin - seems to help the pain though so wants to try to try weaning his meds to avoid higher doses, and w/d; and more trouble sleeping so would like to consider decrased adderall to see if this would help.  Pt denies CP, sob, doe, wheezing, orthopnea, pnd, worsening LE edema, palps, dizziness or syncope  Pt denies new neuro symptoms such as headache, facial or extremity weakness Denies worsening depressive symtpoms, suicidal ideaiton or panic.    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.52) 3)  Anxiety  (ICD-300.00) 4)  Diverticulosis, Colon  (ICD-562.10) 5)  Esophageal Varices  (ICD-456.1) 6)  Chronic Pain Syndrome  (ICD-338.4) 7)  Preventive Health Care  (ICD-V70.0) 8)  Thumb Pain, Left  (ICD-729.5) 9)  Fever Unspecified  (ICD-780.60) 10)  Sinusitis- Acute-nos  (ICD-461.9) 11)  Nephrolithiasis,  Hx of  (ICD-V13.01) 12)  Special Screening Malig Neoplasms Other Sites  (ICD-V76.49) 13)  Fatigue  (ICD-780.79) 14)  Benign Prostatic Hypertrophy  (ICD-600.00) 15)  Seizure Disorder  (ICD-780.39) 16)  Allergic Rhinitis  (ICD-477.9) 17)  Low Back Pain  (ICD-724.2) 18)  Hyperlipidemia  (ICD-272.4) 19)  Hepatitis C  (ICD-070.51) 20)  Gerd  (ICD-530.81) 21)  Adhd  (ICD-314.01) 22)  Bipolar Affective Disorder  (ICD-296.80) 23)  Depression  (ICD-311)  Medications Prior to Update: 1)  Pristiq 50 Mg Xr24h-Tab (Desvenlafaxine Succinate) .Marland Kitchen.. 1 By Mouth Once Daily 2)  Adderall Xr 20 Mg  Cp24 (Amphetamine-Dextroamphetamine) .Marland Kitchen.. 1 By Mouth Once Daily - To Fill Oct 19, 2009 3)  Doxazosin Mesylate 2 Mg Tabs (Doxazosin Mesylate) .Marland Kitchen.. 1 By Mouth At Bedtime As Needed 4)  Hydrocodone-Acetaminophen 10-325 Mg Tabs (Hydrocodone-Acetaminophen) .... Take 1 Tablet By Mouth Four Times A Day As Needed - To Fill  May 18, 2009 5)  Morphine Sulfate Cr 30 Mg Xr12h-Tab (Morphine Sulfate) .Marland Kitchen.. 1 By Mouth Two Times A Day  - To Fill Oct 10, 2009 6)  Gabapentin 300 Mg Caps (Gabapentin) .Marland Kitchen.. 1 By Mouth Qid 7)  Carisoprodol 350 Mg Tabs (Carisoprodol) .Marland Kitchen.. 1 By Mouth At Bedtime 8)  Prednisone 10 Mg Tabs (Prednisone) .Marland KitchenMarland KitchenMarland Kitchen  3po Qd For 3days, Then 2po Qd For 3days, Then 1po Qd For 3days, Then Stop 9)  Oxycodone Hcl 5 Mg Caps (Oxycodone Hcl) .Marland Kitchen.. 1 - 2 By Mouth Q 6 Hrs As Needed Pain  Current Medications (verified): 1)  Pristiq 50 Mg Xr24h-Tab (Desvenlafaxine Succinate) .Marland Kitchen.. 1 By Mouth Once Daily 2)  Adderall Xr 10 Mg Xr24h-Cap (Amphetamine-Dextroamphetamine) .Marland Kitchen.. 1 By Mouth Once Daily  - To Fill Dec 11, 2009 3)  Doxazosin Mesylate 2 Mg Tabs (Doxazosin Mesylate) .Marland Kitchen.. 1 By Mouth At Bedtime As Needed 4)  Hydrocodone-Acetaminophen 7.5-325 Mg Tabs (Hydrocodone-Acetaminophen) .Marland Kitchen.. 1 By Mouth Q 6 Hrs As Needed Pain 5)  Morphine Sulfate Cr 30 Mg Xr12h-Tab (Morphine Sulfate) .Marland Kitchen.. 1 By Mouth Two Times A Day  - To Fill Dec 11, 2009 6)   Gabapentin 300 Mg Caps (Gabapentin) .Marland Kitchen.. 1 By Mouth Qid 7)  Carisoprodol 350 Mg Tabs (Carisoprodol) .Marland Kitchen.. 1 By Mouth At Bedtime 8)  Prednisone 10 Mg Tabs (Prednisone) .... 3po Qd For 3days, Then 2po Qd For 3days, Then 1po Qd For 3days, Then Stop  Allergies (verified): 1)  ! Sulfa 2)  ! * Mercury 3)  ! * Latex 4)  ! * Shellfish  Past History:  Past Medical History: Last updated: 04/19/2009 Depression Bipolar Disoder ADHD GERD Hepatitis C Hyperlipidemia, mild Low back pain, chronic Allergic rhinitis Seizure disorder, H/O Benign prostatic hypertrophy migraine hx of ETOH abuse Nephrolithiasis, hx of recurrent sinusitis portal vein HTN with esoph varices/gastropathy Diverticulosis, colon Anxiety  Past Surgical History: Last updated: 01/18/2009 Back Surgery x 5 Knee Surgery x 1 hx of fx cheekbone Tonsillectomy s/p sinus surgury aug 2010 - dr Zara Chess  Social History: Last updated: 11/02/2007 Married 3 children work - used to work for [employer], now disabled due to back pain Current Smoker Alcohol use-no  Risk Factors: Smoking Status: current (11/02/2007)  Review of Systems       all otherwise negative per pt -    Physical Exam  General:  alert and overweight-appearing.   Head:  normocephalic and atraumatic.   Eyes:  vision grossly intact, pupils equal, and pupils round.   Ears:  R ear normal and L ear normal.   Nose:  no external deformity and no nasal discharge.   Mouth:  no gingival abnormalities and pharynx pink and moist.   Neck:  supple and no masses.   Lungs:  normal respiratory effort and normal breath sounds.   Heart:  normal rate and regular rhythm.   Abdomen:  soft, non-tender, and normal bowel sounds.   Msk:  no joint tenderness and no joint swelling.   Extremities:  no edema, no erythema  Psych:  not depressed appearing and moderately anxious.     Impression & Recommendations:  Problem # 1:  CHRONIC PAIN SYNDROME  (ICD-338.4) ok to decrease the vicodin strength, eventually try to wean the morphine as well  Problem # 2:  ADHD (ICD-314.01) ok to try decrease the adderall per pt request  Problem # 3:  ANXIETY (ICD-300.00)  His updated medication list for this problem includes:    Pristiq 50 Mg Xr24h-tab (Desvenlafaxine succinate) .Marland Kitchen... 1 by mouth once daily stable overall by hx and exam, ok to continue meds/tx as is   Complete Medication List: 1)  Pristiq 50 Mg Xr24h-tab (Desvenlafaxine succinate) .Marland Kitchen.. 1 by mouth once daily 2)  Adderall Xr 10 Mg Xr24h-cap (Amphetamine-dextroamphetamine) .Marland Kitchen.. 1 by mouth once daily  - to fill Dec 11, 2009 3)  Doxazosin Mesylate 2  Mg Tabs (Doxazosin mesylate) .Marland Kitchen.. 1 by mouth at bedtime as needed 4)  Hydrocodone-acetaminophen 7.5-325 Mg Tabs (Hydrocodone-acetaminophen) .Marland Kitchen.. 1 by mouth q 6 hrs as needed pain 5)  Morphine Sulfate Cr 30 Mg Xr12h-tab (Morphine sulfate) .Marland Kitchen.. 1 by mouth two times a day  - to fill Dec 11, 2009 6)  Gabapentin 300 Mg Caps (Gabapentin) .Marland Kitchen.. 1 by mouth qid 7)  Carisoprodol 350 Mg Tabs (Carisoprodol) .Marland Kitchen.. 1 by mouth at bedtime 8)  Prednisone 10 Mg Tabs (Prednisone) .... 3po qd for 3days, then 2po qd for 3days, then 1po qd for 3days, then stop  Patient Instructions: 1)  your medications are refilled today, except the vicodin is reduced in strength , and the adderal is decreaed as well 2)  please followup with GI in [place] if the vomiting recurs 3)  Continue all previous medications as before this visit  4)  Please schedule a follow-up appointment in 3 months with CPX labs and : 5)  alphafetoprotein -:  789.09 Prescriptions: MORPHINE SULFATE CR 30 MG XR12H-TAB (MORPHINE SULFATE) 1 by mouth two times a day  - to fill Dec 11, 2009  #60 x 0   Entered and Authorized by:   [name] MD   Signed by:   [name] MD on 10/12/2009   Method used:   Print then Give to Patient   RxID:   [id number] MORPHINE SULFATE CR 30 MG XR12H-TAB (MORPHINE  SULFATE) 1 by mouth two times a day  - to fill sept 10, 2011  #60 x 0   Entered and Authorized by:   [name] MD   Signed by:   [name] MD on 10/12/2009   Method used:   Print then Give to Patient   RxID:   [id number] MORPHINE SULFATE CR 30 MG XR12H-TAB (MORPHINE SULFATE) 1 by mouth two times a day  - to fill Oct 12, 2009  #60 x 0   Entered and Authorized by:   [name] MD   Signed by:   [name] MD on 10/12/2009   Method used:   Print then Give to Patient   RxID:   [id number] HYDROCODONE-ACETAMINOPHEN 7.5-325 MG TABS (HYDROCODONE-ACETAMINOPHEN) 1 by mouth q 6 hrs as needed pain  #120 x 2   Entered and Authorized by:   [name] MD   Signed by:   [name] MD on 10/12/2009   Method used:   Print then Give to Patient   RxID:   [id number] ADDERALL XR 10 MG XR24H-CAP (AMPHETAMINE-DEXTROAMPHETAMINE) 1 by mouth once daily  - to fill Dec 11, 2009  #30 x 0   Entered and Authorized by:   [name] MD   Signed by:   [name] MD on 10/12/2009   Method used:   Print then Give to Patient   RxID:   [id number] ADDERALL XR 10 MG XR24H-CAP (AMPHETAMINE-DEXTROAMPHETAMINE) 1 by mouth once daily  - to fill sept 10, 2011  #30 x 0   Entered and Authorized by:   [name] MD   Signed by:   [name] MD on 10/12/2009   Method used:   Print then Give to Patient   RxID:   [id number] ADDERALL XR 10 MG XR24H-CAP (AMPHETAMINE-DEXTROAMPHETAMINE) 1 by mouth once daily  - to fill Oct 12, 2009  #30 x 0   Entered and Authorized by:   [name] MD   Signed by:  [name] MD on 10/12/2009   Method used:   Print then Give to Patient   RxID:   [id number]

## 2010-04-05 NOTE — Progress Notes (Signed)
Summary: refill request  Phone Note Call from Patient Call back at Home Phone [phone]   Caller: Patient Summary of Call: pt called stating that he is out of Morphine, pt said he has had to use extra morphine while waiting to fill the Hydrocodone. Pt has appt on 04/19/2008. Okay to advised pt that MD does not give early refills? *pt also request Rx fo Alprazolam for sleep. I will decline request until OV eval with MD* Initial call taken by: Margaret Pyle, CMA,  April 12, 2009 2:17 PM  Follow-up for Phone Call        ok for exception this time, but please inform pt this is very unsual, and cannot be done again in the future - .done hardcopy to [facility] side B - dahlia  Follow-up by: [name] MD,  April 12, 2009 4:37 PM  Additional Follow-up for Phone Call Additional follow up Details #1::        pt informed, rx in cabinet for pt pick up Additional Follow-up by: Margaret Pyle, CMA,  April 13, 2009 8:02 AM    New/Updated Medications: MORPHINE SULFATE 30 MG TABS (MORPHINE SULFATE) 1po two times a day  - to fill Apr 13, 2009 Prescriptions: MORPHINE SULFATE 30 MG TABS (MORPHINE SULFATE) 1po two times a day  - to fill Apr 13, 2009  #60 x 0   Entered and Authorized by:   [name] MD   Signed by:   [name] MD on 04/12/2009   Method used:   Print then Give to Patient   RxID:   [id number]

## 2010-04-05 NOTE — Procedures (Signed)
Summary: [facility] [address]   Imported By: Sherian Rein 04/06/2009 14:36:22  _____________________________________________________________________  External Attachment:    Type:   Image     Comment:   External Document

## 2010-04-23 ENCOUNTER — Telehealth: Payer: Self-pay | Admitting: Internal Medicine

## 2010-05-01 NOTE — Progress Notes (Signed)
Summary: med refill  Phone Note Refill Request Message from:  Fax from Pharmacy on April 23, 2010 11:03 AM  Refills Requested: Medication #1:  CARISOPRODOL 350 MG TABS 1 by mouth  two times a day as needed   Dosage confirmed as above?Dosage Confirmed   Last Refilled: 01/02/2010   Notes: [facility] [address]. [place] [address][id number] Initial call taken by: Zella Ball Ewing CMA Duncan Dull),  April 23, 2010 11:03 AM  Follow-up for Phone Call        faxed hardcopy to pharmacy Follow-up by: Robin Ewing CMA Duncan Dull),  April 23, 2010 12:55 PM    New/Updated Medications: CARISOPRODOL 350 MG TABS (CARISOPRODOL) 1 by mouth  two times a day as needed Prescriptions: CARISOPRODOL 350 MG TABS (CARISOPRODOL) 1 by mouth  two times a day as needed  #60 x 3   Entered and Authorized by:   [name] MD   Signed by:   [name] MD on 04/23/2010   Method used:   Print then Give to Patient   RxID:   [id number]  done hardcopy to [facility] side B - dahlia  [name] MD  April 23, 2010 12:11 PM

## 2010-05-02 ENCOUNTER — Encounter: Payer: Self-pay | Admitting: Internal Medicine

## 2010-05-02 ENCOUNTER — Ambulatory Visit (INDEPENDENT_AMBULATORY_CARE_PROVIDER_SITE_OTHER): Payer: Medicare Other | Admitting: Internal Medicine

## 2010-05-02 DIAGNOSIS — F329 Major depressive disorder, single episode, unspecified: Secondary | ICD-10-CM

## 2010-05-02 DIAGNOSIS — K5289 Other specified noninfective gastroenteritis and colitis: Secondary | ICD-10-CM

## 2010-05-02 DIAGNOSIS — G894 Chronic pain syndrome: Secondary | ICD-10-CM

## 2010-05-10 NOTE — Assessment & Plan Note (Signed)
Summary: MED---FU---STC   Vital Signs:  Patient profile:   60 year old male Height:      74 inches Weight:      214 pounds BMI:     27.58 O2 Sat:      95 % on Room air Temp:     98.4 degrees F oral Pulse rate:   95 / minute BP sitting:   146 / 80  (left arm) Cuff size:   large  Vitals Entered By: Zella Ball Ewing CMA Duncan Dull) (May 02, 2010 4:47 PM)  O2 Flow:  Room air CC: Discuss medications/RE   Primary Care Provider:  [name] MD  CC:  Discuss medications/RE.  History of Present Illness: here to f/u; overall doing ok;  Pt denies CP, worsening sob, doe, wheezing, orthopnea, pnd, worsening LE edema, palps, dizziness or syncope  Pt denies new neuro symptoms such as headache, facial or extremity weakness  Pt denies polydipsia, polyuria   Overall good compliance with meds, trying to follow low chol diet, wt stable, little excercise however .  Denies worsening depressive symptoms, suicidal ideation, or panic., and is interested in trying to come off the pristiq, as well as try to wean some down the morphine. No fever, wt loss, night sweats, loss of appetite or other constitutional symptoms  Overall good compliance with meds, and good tolerability.  Incidently with 3 days nausea, feeling warm and fatigued and loos stools, mostly cleared up today.    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.52) 5)  Anxiety  (ICD-300.00) 6)  Diverticulosis, Colon  (ICD-562.10) 7)  Esophageal Varices  (ICD-456.1) 8)  Chronic Pain Syndrome  (ICD-338.4) 9)  Preventive Health Care  (ICD-V70.0) 10)  Thumb Pain, Left  (ICD-729.5) 11)  Fever Unspecified  (ICD-780.60) 12)  Sinusitis- Acute-nos  (ICD-461.9) 13)  Nephrolithiasis, Hx of  (ICD-V13.01) 14)  Special Screening Malig Neoplasms Other Sites  (ICD-V76.49) 15)  Fatigue  (ICD-780.79) 16)  Benign Prostatic  Hypertrophy  (ICD-600.00) 17)  Seizure Disorder  (ICD-780.39) 18)  Allergic Rhinitis  (ICD-477.9) 19)  Low Back Pain  (ICD-724.2) 20)  Hyperlipidemia  (ICD-272.4) 21)  Hepatitis C  (ICD-070.51) 22)  Gerd  (ICD-530.81) 23)  Adhd  (ICD-314.01) 24)  Bipolar Affective Disorder  (ICD-296.80) 25)  Depression  (ICD-311)  Medications Prior to Update: 1)  Pristiq 50 Mg Xr24h-Tab (Desvenlafaxine Succinate) .Marland Kitchen.. 1 By Mouth Once Daily 2)  Adderall Xr 10 Mg Xr24h-Cap (Amphetamine-Dextroamphetamine) .Marland Kitchen.. 1 By Mouth Once Daily  - To Fill Apr 02, 2010 3)  Hydrocodone-Acetaminophen 7.5-325 Mg Tabs (Hydrocodone-Acetaminophen) .Marland Kitchen.. 1 By Mouth Q 6 Hrs As Needed Pain - To Fill Feb 27, 2010 4)  Morphine Sulfate Cr 30 Mg Xr12h-Tab (Morphine Sulfate) .Marland Kitchen.. 1 By Mouth Two Times A Day  - To Fill Feb 27, 2010 5)  Gabapentin 300 Mg Caps (Gabapentin) .Marland Kitchen.. 1 By Mouth Four Times Per Day 6)  Carisoprodol 350 Mg Tabs (Carisoprodol) .Marland Kitchen.. 1 By Mouth  Two Times A Day As Needed 7)  Ondansetron Hcl 4 Mg Tabs (Ondansetron Hcl) .Marland Kitchen.. 1 By Mouth Every 8 Hours As Needed For Nausea and Vomiting 8)  Aspir-Low 81 Mg Tbec (Aspirin) .Marland Kitchen.. 1 By Mouth Once Daily  Current Medications (verified): 1)  Adderall Xr 10 Mg Xr24h-Cap (Amphetamine-Dextroamphetamine) .Marland Kitchen.. 1 By Mouth Once Daily  - To Fill Apr 02, 2010 2)  Hydrocodone-Acetaminophen 7.5-325 Mg Tabs (  Hydrocodone-Acetaminophen) .Marland Kitchen.. 1 By Mouth Q 6 Hrs As Needed Pain - To Fill May 02, 2010 3)  Ms Contin 15 Mg Xr12h-Tab (Morphine Sulfate) .... 2 By Mouth Two Times A Day 4)  Gabapentin 300 Mg Caps (Gabapentin) .Marland Kitchen.. 1 By Mouth Four Times Per Day 5)  Carisoprodol 350 Mg Tabs (Carisoprodol) .Marland Kitchen.. 1 By Mouth  Two Times A Day As Needed 6)  Ondansetron Hcl 4 Mg Tabs (Ondansetron Hcl) .Marland Kitchen.. 1 By Mouth Every 8 Hours As Needed For Nausea and Vomiting 7)  Aspir-Low 81 Mg Tbec (Aspirin) .Marland Kitchen.. 1 By Mouth Once Daily  Allergies (verified): 1)  ! Sulfa 2)  ! * Mercury 3)  ! * Latex 4)  ! *  Shellfish 5)  * Cymbalta  Past History:  Past Medical History: Last updated: 04/19/2009 Depression Bipolar Disoder ADHD GERD Hepatitis C Hyperlipidemia, mild Low back pain, chronic Allergic rhinitis Seizure disorder, H/O Benign prostatic hypertrophy migraine hx of ETOH abuse Nephrolithiasis, hx of recurrent sinusitis portal vein HTN with esoph varices/gastropathy Diverticulosis, colon Anxiety  Past Surgical History: Last updated: 01/18/2009 Back Surgery x 5 Knee Surgery x 1 hx of fx cheekbone Tonsillectomy s/p sinus surgury aug 2010 - dr Zara Chess  Social History: Last updated: 01/02/2010 Married 3 children work - used to work for [employer], now disabled due to back pain Current Smoker Alcohol use-no Drug use-no  Risk Factors: Smoking Status: current (11/02/2007)  Review of Systems       all otherwise negative per pt -    Physical Exam  General:  overweight-appearing.alert.   Head:  normocephalic and atraumatic.   Eyes:  vision grossly intact, pupils equal, and pupils round.   Ears:  R ear normal and L ear normal.   Nose:  no external deformity and no nasal discharge.   Mouth:  no gingival abnormalities and pharynx pink and moist.   Neck:  supple and no masses.   Lungs:  normal respiratory effort and normal breath sounds.   Heart:  normal rate and regular rhythm.   Abdomen:  soft, non-tender, and normal bowel sounds.   Extremities:  no edema, no erythema  Psych:  not depressed appearing and slightly anxious.     Impression & Recommendations:  Problem # 1:  DEPRESSION (ICD-311) Assessment Improved  The following medications were removed from the medication list:    Pristiq 50 Mg Xr24h-tab (Desvenlafaxine succinate) .Marland Kitchen... 1 by mouth once daily ok to stop med fo rnow and follow, consider re-start or psychiatry for recurrant or worsening symptoms symptoms  Discussed treatment options, including trial of antidpressant medication.   Patient agrees to call if any worsening of symptoms or thoughts of doing harm arise. Verified that the patient has no suicidal ideation at this time.   Problem # 2:  CHRONIC PAIN SYNDROME (ICD-338.4) Assessment: Improved ok to change to current rx, and try to take less MScontin  by weaning off 15 mg in the am or pm over the next 2 wks with goal to 15 two times a day after that for now  Problem # 3:  GASTROENTERITIS, ACUTE (ICD-558.9) Assessment: New  His updated medication list for this problem includes:    Ondansetron Hcl 4 Mg Tabs (Ondansetron hcl) .Marland Kitchen... 1 by mouth every 8 hours as needed for nausea and vomiting resolving , likly the noroviral illness, ok to follow for now, push fluids, tylenol as needed   Discussed use of medication and role of diet. Encouraged clear liquids and electrolyte replacement fluids. Instructed  to call if any signs of worsening dehydration.   Complete Medication List: 1)  Adderall Xr 10 Mg Xr24h-cap (Amphetamine-dextroamphetamine) .Marland Kitchen.. 1 by mouth once daily  - to fill Apr 02, 2010 2)  Hydrocodone-acetaminophen 7.5-325 Mg Tabs (Hydrocodone-acetaminophen) .Marland Kitchen.. 1 by mouth q 6 hrs as needed pain - to fill May 02, 2010 3)  Ms Contin 15 Mg Xr12h-tab (Morphine sulfate) .... 2 by mouth two times a day 4)  Gabapentin 300 Mg Caps (Gabapentin) .Marland Kitchen.. 1 by mouth four times per day 5)  Carisoprodol 350 Mg Tabs (Carisoprodol) .Marland Kitchen.. 1 by mouth  two times a day as needed 6)  Ondansetron Hcl 4 Mg Tabs (Ondansetron hcl) .Marland Kitchen.. 1 by mouth every 8 hours as needed for nausea and vomiting 7)  Aspir-low 81 Mg Tbec (Aspirin) .Marland Kitchen.. 1 by mouth once daily  Patient Instructions: 1)  stop the pristiq 2)  try to decrease the morphine by 15 mg in the am or pm for one wk, then another 15 mg in the AM or PM the next wk after that 3)  Continue all previous medications as before this visit  4)  Please schedule a follow-up appointment in 1 month (the office will  call) Prescriptions: HYDROCODONE-ACETAMINOPHEN 7.5-325 MG TABS (HYDROCODONE-ACETAMINOPHEN) 1 by mouth q 6 hrs as needed pain - to fill May 02, 2010  #120 x 5   Entered and Authorized by:   [name] MD   Signed by:   [name] MD on 05/02/2010   Method used:   Print then Give to Patient   RxID:   [id number] MS CONTIN 15 MG XR12H-TAB (MORPHINE SULFATE) 2 by mouth two times a day  #120 x 0   Entered and Authorized by:   [name] MD   Signed by:   [name] MD on 05/02/2010   Method used:   Print then Give to Patient   RxID:   [id number]    Orders Added: 1)  Est. Patient Level IV [72536]

## 2010-05-23 ENCOUNTER — Other Ambulatory Visit: Payer: Self-pay | Admitting: Internal Medicine

## 2010-05-23 NOTE — Telephone Encounter (Signed)
Prescription removed from medication list 05/02/2010

## 2010-05-25 ENCOUNTER — Encounter: Payer: Self-pay | Admitting: Internal Medicine

## 2010-05-25 ENCOUNTER — Ambulatory Visit (INDEPENDENT_AMBULATORY_CARE_PROVIDER_SITE_OTHER): Payer: Medicare Other | Admitting: Internal Medicine

## 2010-05-25 VITALS — BP 140/78 | HR 110 | Temp 99.5°F | Ht 74.0 in | Wt 213.5 lb

## 2010-05-25 DIAGNOSIS — G894 Chronic pain syndrome: Secondary | ICD-10-CM

## 2010-05-25 DIAGNOSIS — F909 Attention-deficit hyperactivity disorder, unspecified type: Secondary | ICD-10-CM

## 2010-05-25 DIAGNOSIS — F329 Major depressive disorder, single episode, unspecified: Secondary | ICD-10-CM

## 2010-05-25 DIAGNOSIS — M545 Low back pain: Secondary | ICD-10-CM

## 2010-05-25 MED ORDER — MORPHINE SULFATE CR 30 MG PO TB12: ORAL_TABLET | ORAL | Status: DC

## 2010-05-25 MED ORDER — MORPHINE SULFATE CR 30 MG PO TB12: 30.0000 mg | ORAL_TABLET | Freq: Two times a day (BID) | ORAL | Status: DC

## 2010-05-25 MED ORDER — KETOROLAC TROMETHAMINE 10 MG PO TABS: 10.0000 mg | ORAL_TABLET | Freq: Four times a day (QID) | ORAL | Status: AC | PRN

## 2010-05-25 MED ORDER — DESVENLAFAXINE SUCCINATE ER 50 MG PO TB24: 50.0000 mg | ORAL_TABLET | Freq: Every day | ORAL | Status: DC

## 2010-05-25 NOTE — Assessment & Plan Note (Signed)
Uncontrolled - to adjust the MS Contin back to the 30 bid - 3 mo rx done; exam o/w stable overall by hx and exam, ok to continue meds/tx as is

## 2010-05-25 NOTE — Assessment & Plan Note (Signed)
Mild worsening, to re-start the pristiq as before; f/u any worsening symtpoms

## 2010-05-25 NOTE — Patient Instructions (Signed)
Take all new medications as prescribed Continue all other medications as before , except the morphine to be changed back to the 30 mg twice per day Please followup in 3 months

## 2010-05-25 NOTE — Assessment & Plan Note (Signed)
Uncontrolled recently subjective - to add nsaid prn , f/u any worsening symptoms

## 2010-05-25 NOTE — Progress Notes (Signed)
Subjective:    Patient ID: Kurt Reid, male    DOB: 07-13-1950, 61 y.o.   MRN: [id number]  HPI  Here for 1 month f/u; overall doing ok, but has not done well with pain control with the reduced morphine;  I have offered to refer to pain clinic but he has declined (and again today) due to cost;  Overall LBP worse but no specific physical symptoms worse such as worsening LE pain/ weak/numb, bowel or bladder change, fever, wt loss, gait change , fall or injury.   Also with increased depressive symptoms , denies suicidal ideation, and with overall stable anxiety levels.  No behavioral changes such as increased hallucinations, paranoia or agitation.  Pt denies chest pain, increased sob or doe, wheezing, orthopnea, PND, increased LE swelling, palpitations, dizziness or syncope.    Pt denies new neurological symptoms such as new headache, or facial or extremity weakness or numbness.   Pt denies polydipsia, polyuria.   Overall ADHD symptoms stable, with adequate ability for focus and task completion today.   Overall good compliance with treatment, and good medicine tolerability.   Pt denies fever, wt loss, night sweats, loss of appetite, or other constitutional symptoms     Review of Systems Review of Systems  Constitutional: Negative for diaphoresis and unexpected weight change.  HENT: Negative for drooling and tinnitus.   Eyes: Negative for photophobia and visual disturbance.  Respiratory: Negative for choking and stridor.   Gastrointestinal: Negative for vomiting and blood in stool.  Genitourinary: Negative for hematuria and decreased urine volume.  Musculoskeletal: Negative for gait problem.  Skin: Negative for color change and wound.  Neurological: Negative for tremors and numbness.  Psychiatric/Behavioral: Negative for decreased concentration. The patient is not hyperactive.    Past Medical History  Diagnosis Date  . Depression   . Bipolar 1 disorder   . ADHD (attention deficit  hyperactivity disorder)   . GERD (gastroesophageal reflux disease)   . Hepatitis C   . Hyperlipidemia   . Low back pain     chronic  . Allergic rhinitis   . Seizure disorder     H/O  . Benign prostatic hypertrophy   . Migraine   . ETOH abuse     hx  . Nephrolithiasis     hx  . Sinusitis     recurrent  . Diverticulosis of colon   . Anxiety    Past Surgical History  Procedure Date  . Back surgury     x 5  . Knee surgury     x 1  . Cheekbone     hx of fx  . Tonsillectomy   . S/p sinus surgury 2010    Dr. Zara Chess    reports that he has been smoking.  He does not have any smokeless tobacco history on file. He reports that he does not drink alcohol or use illicit drugs. family history includes Asthma in his daughter; Depression in his mother; Hyperlipidemia in his mother; and Stroke in his mother. Allergies  Allergen Reactions  . Duloxetine     REACTION: memory dysfunction, dizziness  . Latex   . Mercury   . Sulfonamide Derivatives        Objective:   Physical Exam    Physical Exam  Constitutional: Pt appears well-developed and well-nourished.  HENT: Head: Normocephalic.  Right Ear: External ear normal.  Left Ear: External ear normal.  Eyes: Conjunctivae and EOM are normal. Pupils are equal, round, and reactive to light.  Neck: Normal range of motion. Neck supple.  Cardiovascular: Normal rate and regular rhythm.   Pulmonary/Chest: Effort normal and breath sounds normal.  Abd:  Soft, NT, non-distended, + BS Neurological: Pt is alert. No cranial nerve deficit.  Motor/sens/dtr's nonfocal Skin: Skin is warm. No erythema.  Psychiatric: Pt behavior is normal. Thought content normal.  mild depressed affect, mod anxious      Assessment & Plan:

## 2010-05-28 ENCOUNTER — Other Ambulatory Visit: Payer: Self-pay

## 2010-05-28 DIAGNOSIS — F909 Attention-deficit hyperactivity disorder, unspecified type: Secondary | ICD-10-CM

## 2010-05-29 MED ORDER — AMPHETAMINE-DEXTROAMPHET ER 10 MG PO CP24: 10.0000 mg | ORAL_CAPSULE | Freq: Every day | ORAL | Status: DC

## 2010-05-29 NOTE — Telephone Encounter (Signed)
Done hardcopy to dahlia/LIM B  

## 2010-05-29 NOTE — Telephone Encounter (Signed)
Pt informed, Rx in cabinet for pt pick up  

## 2010-06-27 ENCOUNTER — Other Ambulatory Visit: Payer: Self-pay

## 2010-06-27 DIAGNOSIS — F909 Attention-deficit hyperactivity disorder, unspecified type: Secondary | ICD-10-CM

## 2010-06-27 MED ORDER — AMPHETAMINE-DEXTROAMPHET ER 10 MG PO CP24: 10.0000 mg | ORAL_CAPSULE | Freq: Every day | ORAL | Status: DC

## 2010-06-28 NOTE — Telephone Encounter (Signed)
Pt informed via VM, Rx in cabinet for pt pick up  

## 2010-07-17 NOTE — Assessment & Plan Note (Signed)
Kurt Reid is a 60 year old married gentleman who has been followed in  our [facility] for chronic low back pain.  He has a  history of multilevel degenerative changes in his lumbar spine and is  status post 4 lumbar surgeries in the past.  He is also known to have  multilevel degenerative thoracic changes.   He is back in today for a refill of his medications.  He was last seen  on Jul 29, 2007.  In June and July, he was seen by nursing staff for  refill of his medications.   He is back in today and indicates his average pain is about 4 on a scale  of 10.  Pain is localized to the low back region and lower thoracic  region as well as some symptoms into the calves.  He gets some cramping.   He indicates his general activity is a little to moderately affected by  his symptoms.  His sleep is overall fairly good.  The pain is typically  worse with activity, improves with rest, heat, pacing, his activities  and medications.  He reports a good relief with current meds which are  provided through this clinic and include the following;  1. Avinza 90 mg one p.o. daily.  2. Norco 10/325 one p.o. q.i.d.  3. Robaxin 500 mg one p.o. daily.  4. Lyrica 25 mg t.i.d.   Functional status, he is able to walk at least 60 minutes at a time.  He  is able to climb stairs.  He is able to drive.  He is independent with  his self care.  He helps out with some lawn work including riding a [employer].  He plays with his 2 dogs.  He does some computer work and spends  time watching TV also.   REVIEW OF SYSTEMS:  Positive for spasm especially in the lower  extremities, anxiety, and occasional confusion.   No changes in past medical, social, or family histories since his last  visit.   PHYSICAL EXAMINATION:  VITAL SIGNS:  Blood pressure is 115/78, pulse 98,  respirations 18, and 98% saturation on room air.  GENERAL:  He is a well-developed, well-nourished gentleman who appears  his  stated age.  He is oriented x3.  Speech is clear.  His affect is  bright.  He is alert, cooperative, and pleasant.  Follows commands  without difficulty.   Cranial nerves are grossly intact.  Coordination is intact.  Reflexes  are 2+ at the patellar tendon, 0 at the Achilles tendon.  He has full  strength in lower extremities without focal deficit.  Straight leg raise  is negative.   He is able to transition easily from sitting to standing.  His gait in  the room is not antalgic.  Tandem gait and Romberg's test are all  performed adequately.  He has limitations in lumbar motion in all  planes.   IMPRESSION:  1. Lumbago with known multilevel degenerative changes within the      lumbar spine as well as the thoracic spine.  He is status post 4      lumbar surgeries.  2. Multilevel thoracic degenerative changes.   PLAN:  I have encouraged him to continue to stay active including  walking at least 3 times a day.  We will refill the following  medications for him; Norco 10/325 one p.o. q.i.d. p.r.n. back pain #120,  Avinza 90 mg one p.o. daily #30, no refills.  We will see him back in 3 months, nursing visit in the interim 2 months.   Mr. Pullara has not displayed any aberrant behavior with the use of the  narcotics.  He uses his pills appropriately.  He takes the medications  as prescribed.  He denies problems with oversedation or constipation  problems.   His other medical problems include a history of seizures and liver  disease.  Apparently, he has hepatitis C and follows up with Dr. Jonny Ruiz  for this.   I encouraged Mr. Marsala to maintain contact with Dr. Jonny Ruiz.  We will see  him back in 3 months.           ______________________________  Brantley Stage, M.D.     DMK/MedQ  D:  10/30/2007 09:56:54  T:  10/30/2007 23:44:43  Job #:  [id number]

## 2010-07-17 NOTE — Assessment & Plan Note (Signed)
Kurt Reid is a 60 year old married gentleman who has been followed in  our [facility] Clinic for chronic low back pain.  He has a  history of multilevel degenerative changes within his lumbar spine, and  he is status post multiple lumbar surgeries in the past.   He is also known to have multilevel degenerative thoracic changes.   He is back in today states his average pain is about a 4 on a scale of  10, interfering with general activity a lot.  Poor sleep is noted.  Pain  is worst with walking, bending, sitting, and standing; improves with  rest, heat, pacing his activities, medications.   He reports fair relief with current meds provided by this clinic.   MEDICATIONS:  From this clinic include,  1. Avinza 90 mg 1 p.o. daily, #30.  2. Norco 10/325 1 p.o. q.i.d. p.r.n. back pain, #20.  3. Robaxin 500 mg 1 p.o. daily.  4. Lyrica 25 mg t.i.d.   Kurt Reid reports that he is able to walk at least 60 minutes a day.  He  is able to climb stairs and drive.  He is independent with his self-  care.  Overall, a high-functioning individual within his home.   He reports occasional spasms and weakness, as well as anxiety.  Denies  bladder or bowel control problems.  Denies suicidal ideation.   PAST MEDICAL HISTORY:  Remarkable for recent dental abscesses.  He has  had 2 teeth removed in the last month.  He has several other broken  teeth as well.   He has a followup appointment with Dr. Jonny Ruiz within the next month and  apparently some blood work is planned.  Kurt Reid has a history of  seizures and liver disease.  Apparently, may have hepatitis C.   His social and family histories are otherwise unchanged.   PHYSICAL EXAMINATION:  Blood pressure is 125/88, pulse 101, respiration  18, and 95% saturated on room air.   He is a well-developed, well-nourished gentleman who appears his stated  age and does not appear in any distress.   He is oriented x3.  Speech is clear.  Affect  is bright.  He is alert,  cooperative, and pleasant, and he follows commands without any  difficulties.   Transitioning from sitting to standing is done with ease.  Gait in the  room in nonantalgic.  Tandem gait and Romberg's tests are performed  adequately.  He has limitations in the lumbar motion in all planes.  His  reflexes are symmetric and intact in the lower extremities.  No new  sensory deficits are appreciated.  He has 5/5 strength without focal  deficits appreciated.   IMPRESSION:  1. Low back pain with known multilevel degenerative changes within the      lumbar spine associated with some stenoses and he is status post      multiple lumbar surgeries in the past.  2. Multilevel thoracic degenerative changes.  3. Intermittent epigastric pain, most likely related to nonsteroidal      use.  Again, I recommend him to avoid over-the-counter ibuprofen      and Aleve.  He is currently back on Prilosec and will follow up      with Dr. Jonny Ruiz next month.  4. Lower extremity symptoms are currently not a problem for him at      this time.   PLAN:  I encouraged him to continue to stay active walking several times  a day  and participating in household duties.   We will refill the following medications:  1. Norco 10/325 one p.o. q.i.d. p.r.n. back pain, #120, no refills.  2. Avinza 90 mg 1 p.o. daily, #30, no refills.   He does not exhibit aberrant behavior with the use of his narcotics.  His pill counts are appropriate.  He takes his medications as  prescribed.  He does not have problems with over-sedation or  constipation.   His other medical problems include history of seizures and liver  disease.  He apparently has hepatitis C and is following up with Dr.  Jonny Ruiz for this and intermittent stomach problems.  Again, I encouraged  him to maintain his contact with Dr. Jonny Ruiz and I would like the result of  any blood work to be sent to our office.  He understands this and states  he will  comply.  We will see him back in a month.           ______________________________  Brantley Stage, M.D.     DMK/MedQ  D:  07/29/2007 11:45:33  T:  07/30/2007 02:07:52  Job #:  [id number]   cc:   [name], MD  [address]  [place]  [place] [address]

## 2010-07-17 NOTE — Assessment & Plan Note (Signed)
Kurt Reid is a 60 year old married gentleman who is followed in our [facility]  and [facility]itative [facility] for multiple chronic pain complaints including  cervicalgia, predominately low back pain.  He is known to have  multilevel degenerative changes in his lumbar spine and is status post 4  lumbar surgeries in the past.  He also is known to have multilevel  degenerative thoracic changes.   He is back in today and states that he has seen Dr. Annabell Howells, dentist in  [place] who is planning to extract his teeth next Friday.  Dr. Annabell Howells has  requested we contact him prior to his extraction.   [name]' average pain is about 6 to 8 on a scale on 10 interfering  moderately with activity levels.  His pain is localized mainly in the  low back.  He does have some radiation occasionally to the upper  extremities as well as down both lower extremities.  Pain is aggravated  by walking, bending, sitting, standing, improves with rest, pacing his  activities and medications.   FUNCTIONAL STATUS:  He is able to walk 30 to 40 minutes at a time.  He  is able to climb stairs.  He is able to drive.  He is independent with  self-care and includes higher level household activities.   REVIEW OF SYSTEMS:  Positive for occasional dizziness, spasms, anxiety.  Denies suicidal ideation.  Occasional abdominal pain and respiratory  infection has been noted as well.  He maintains contact with Dr. Jonny Ruiz  for his primary care problems.  He has an appointment with Dr. Tandy Gaw  apparently for further evaluation of his liver.  He has been diagnosed  with hepatitis C recently.   No changes in past medical, social, and family history other than that  noted above.   MEDICATIONS:  Provided through this clinic include:  1. Norco 10/325 q.i.d.  2. Avinza 90 mg 1 p.o. daily.  3. Lyrica 25 mg 1 p.o. q.a.m. 2 p.o. nightly.  4. Robaxin 500 mg on a p.r.n. basis.   PHYSICAL EXAMINATION:  VITALS SIGNS:  Blood pressure 147/87, pulse 18,  respiration 18, and 95% saturate on room air.  GENERAL:  Kurt Reid is a well-developed, well-nourished gentleman who  does not appear in any distress.  He is oriented x3.  Speech is clear.  Affect is bright.  He is alert, cooperative, and pleasant.  Follows  commands without any difficulty.  Cranial nerves are grossly intact.  Coordination is intact.  Reflexes are diminished in the lower  extremities.  No abnormal tone is noted.  No clonus is noted.  He has  patchy decreased sensation in the lower extremities.  However, motor  strength is 5/5 at hip flexors, knee extensors, dorsiflexors, plantar  flexors, EHL.   He has significant limitations and range of motion in his lumbar spine  in all planes and complains of pain during these movement as well.  His  tandem gait and Romberg test are, however, performed adequately.   IMPRESSION:  1. Lumbago with known multilevel degenerative changes in the lumbar      spine, status post 4 lumbar surgeries.  2. Multilevel thoracic degenerative changes.  3. Intermittent neuropathic lower extremity pain.  4. Significant dental procedures.   PLAN:  For multiple tooth extractions next week for Mr. Po per his  dentist Dr. Annabell Howells.   Phone call over to Dr. Annabell Howells discussion regarding Mr. Kottke' medications.  For the 3 days following his extractions, we will discontinue Mr.  Yetta Barre'  Norco and have him start Vicoprofen 7.5/200 q.4-6 h. #20 and then he  will resume his regular dose of Norco 10/325 four times a day #100 for  the next month.  He is also taking his Avinza precautions written for  this as well 90 mg 1 p.o. daily #30.  He has been on the above  medications for many many months now and has done well.  He has had no  complaints of over sedation or significant constipation problems.  He is  able to maintain a relatively functional lifestyle despite significant  functional deficits and his lumbar spine related to 4 surgeries.  We  will see him back  in a month.           ______________________________  Brantley Stage, M.D.     DMK/MedQ  D:  01/22/2008 12:04:26  T:  01/23/2008 00:07:42  Job #:  [id number]

## 2010-07-17 NOTE — Assessment & Plan Note (Signed)
Kurt Reid is a 60 year old married gentleman, who is followed in our  [facility] and [facility]habilitative Clinic for chronic low back pain.  He has a  history of multilevel degenerative changes in his lumbar spine, is  status post multiple lumbar surgeries in the past.  He is known to also  have multilevel degenerative thoracic changes.   He is back in today for a refill of his medications.  Overall, he has  been doing quite well.  His average pain is about 3 on a scale of 10,  described as intermittent, worse with activities, improves with rest,  heat, pacing his activities, and medications.  Sleep is intermittently  fair to poor.  His pain is moderately to little, interfering with his  general activities and enjoyment of life.   He reports good relief with current meds that he is on.   FUNCTIONAL STATUS:  He is able to walk about 60 minutes a day.  He is  able to climb stairs and drive.  He is independent with all of his self-  care.  He is currently doing high level activities at home including  cutting the yard, cleaning things up.  He has been looking at fixing his  lawn mower and the house trim.   He admits to some anxiety.  Denies depression or suicidal ideation.  Denies problems controlling bowel or bladder.  Has no problems with  locking or dizziness.   He does occasionally have spasms, confusion, and weakness, however.   REVIEW OF SYSTEMS:  Positive for nausea.   No changes in past medical, social, or family history since the last  visit.   Physicians currently following Kurt Reid include Dr. Jonny Ruiz and Dr. Foy Guadalajara  over at [facility] who is a liver specialist apparently.  He also  has a psychiatrist.   MEDICATIONS:  Prescribed through this clinic include:  1. Avinza 90 mg daily.  2. Norco 10/325 up to 4 times a day.  3. P.r.n. Robaxin and Lyrica 25 mg 3 times a day.   PHYSICAL EXAMINATION:  VITAL SIGNS:  Blood pressure is 107/78, pulse  108, respirations 20, and 95% saturate  on room air.  GENERAL:  He is a well-developed, well-nourished gentleman who does not  appear in any distress.  He is oriented x3.  Speech is clear.  His  affect is bright.  He is alert, cooperative, and pleasant.  He follows  commands without difficulty.   Cranial nerves are grossly intact.  Coordination is grossly intact.  Reflexes are 1+ at the knees, diminished more at the ankles, 1+ in the  upper extremities.  There is no abnormal tone.  No clonus is noted.  Occasional tremor is seen, which is not new.   Motor strength is 5/5 without focal deficits today.   Straight leg raise is negative.   He has 30 degrees of forward flexion today, less than 5 degrees with  extension.  He has limitations in lateral flexion bilaterally.  He has  tenderness in the lumbar paraspinal muscles.   IMPRESSION:  1. Lumbago with known multilevel degenerative changes within the      lumbar spine associated with some stenosis, status post multiple      surgeries in the past.  2. Multilevel degenerative thoracic changes with intermittent mid back      pain.  3. Intermittent epigastric pain, which is not a problem today.   He does not have complaints of lower extremity pain today either.  His medical problems include a history of seizures and liver disease.  He apparently has hepatitis C.   I have encouraged him to maintain contact with Dr. Jonny Ruiz.  Apparently, he  does have a liver specialist over at [facility], Dr. Foy Guadalajara, which I  have also encouraged him to follow up with.   We have refilled the following medications from today:  1. Norco 10/325, 1 p.o. q.i.d. p.r.n. back pain, #120, no refills.  2. Avinza 90 mg 1 p.o. daily, #30, no refills.   We will see him back in a month.   He takes his medications as prescribed.  He does not exhibit any  aberrant behavior.  Pill counts are appropriate.  We will have a nursing  visit for him next month and I will see him back in 2 months.            ______________________________  Brantley Stage, M.D.     DMK/MedQ  D:  08/28/2007 09:54:28  T:  08/29/2007 00:17:04  Job #:  [id number]   cc:   [name], MD  [address]  [place]  [place] [address]

## 2010-07-17 NOTE — Assessment & Plan Note (Signed)
Kurt Reid is a 60 year old, married gentleman who is back in our pain  rehabilitative clinic for refill of his medications.  He also brings in  more forms today for me to fill out.   The first 15 minutes of his visit were spent filling out his [employer]  disability forms.   He continues to have pain in the cervical region, thoracic region and  low back and pain down the left leg.  His average pain is about 3 or 4  on a scale of 10, moderately interfering with his activities.  His sleep  is fair.  He reports fair relief from the medications that he is  prescribed from this clinic.  The meds from this clinic include Avinza  90 mg one p.o. daily, Lyrica 25 mg b.i.d. and Hydrocodone 10/325 up to 4  times a day.   He states that he is going to be getting some dental work done and would  like to have some extra medication after he has all of his teeth  extracted.  He believes that will be in the next month.   He states he is walking up to 80 minutes at a time.  He is able to climb  stairs.  He is able to drive.  He is independent with his self-care.  He  does help out with hospital tasks at home.   He admits to some spasms in his neck.  Health and history and full  review of systems are also noted.  I asked him to followup with his PCP  for various abdominal complaints.  He does have a gastrointestinal liver  specialist, Dr. Foy Guadalajara, who he is planning to followup with as well.  Dr.  Jonny Ruiz is his primary care doctor.   No changes in his past medical, social or family history since the last  visit.   PHYSICAL EXAMINATION TODAY:  VITAL SIGNS:  Blood pressure is 153/93.  Pulse 91.  Respirations 17.  Saturation 98% on room air.  GENERAL APPEARANCE:  He is a well-developed, well-nourished gentleman  who does not appear in any distress.  He appears his stated age.  NEUROLOGIC:  He is oriented x3.  His speech is clear.  Affect is  cooperative and pleasant.  Transitions from sit to stand  without  difficulty.  Gait in the room was stable.  Tandem gait and Romberg's  tests were performed adequately.  He has a significant limitation in the  range of motion in his cervical spine as well as lumbar spine.  He has  full shoulder range of motion.  Reflexes are brisk throughout upper and  lower extremities.  No clonus is noted, however.  He does have some  tremors in both upper and lower extremities.  Motor strength in general  is in the 5/5 range.  No focal deficits are appreciated.   IMPRESSION:  1. Lumbago.  2. Multi lumbar degenerative changes with stenosis.  See magnetic      resonance imaging from April 2008.  3. Multi-level thoracic degenerative changes.  4. Intermittent cervicalgia.  5. Status post multiple lumbar surgeries in the past, total of 4,      although I do not have documentation supporting all of the      surgeries Kurt Reid states he has had.  6. Bilateral upper extremity neuropathic pain and bilateral lower      extremity neuropathic pain.   PLAN:  We will refill Kurt Reid' Avinza 90 mg one p.o. daily (#30)  and  we will refill his Norco 10/325 one p.o. q.i.d. p.r.n. back or neck pain  (patient may also take up to 6 pills every 4-6 hours after dental  extractions for a total of 5 days postop and a total of 130 per month)   Kurt Reid has been stable on these medications.  He will exhibited no  aberrant behavior.  He takes his medications as prescribed and is  getting sufficient relief with them to stay fairly functional at home.  He has no adverse side effects.  Constipation or oversedation are  currently not a problem.  We will see him back in a month.           ______________________________  Brantley Stage, M.D.     DMK/MedQ  D:  08/28/2006 10:30:37  T:  08/28/2006 11:46:44  Job #:  [id number]

## 2010-07-17 NOTE — Assessment & Plan Note (Signed)
HISTORY:  Kurt Reid is a 60 year old married gentleman who is followed  in our pain and rehabilitative clinic for chronic low back pain.  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  He is status post multiple lumbar surgeries in the past.  He also has  multilevel degenerative thoracic changes.   He has had intermittent sciatica in the past.   Today overall he is doing quite well.  His average pain is about a 5 on  a scale of 10.  His activity is only moderately effected by his pain at  this time. He is staying quite active in the house.  He is able to help  out with laundry and do housework.  He was recently spray painting some  shelves for his mother as well.  He takes his dogs out for a walk two to  three times a day for at least ten minutes at a time.   He is independent with all of his self care and is able to perform  higher level household activities.   His pain is described as intermittent, localized mainly to the low back  and mid thoracic spine as well.  He has some radiating pain today around  his right and left flank.  He states he has been doing some bending and  twisting type activities which seems to have flared him up.  He has also  taken some ibuprofen, three tablets of the 200 mg dosage yesterday and  he is having some epigastric discomfort as well.   He does take over the counter Prilosec.   Pain is typically worse for him when he is walking, bending, prolonged  standing or prolonged sitting.  It improves with rest, heat,  pacing his  activities and medications.  He is currently getting fair relief with  the medications provided by this clinic.   MEDICATIONS:  Medications from [facility] include:  1. Lyrica 25 mg one p.o. t.i.d.  2. Robaxin 500 mg up to one per day.  3. Norco 10/325 up to four times per day p.r.n. back pain.  4. Avinza 90 mg one p.o. daily.   REVIEW OF SYSTEMS:  Otherwise  noncontributory.   He continues to be followed by his primary care, Dr. Jonny Reid.   SOCIAL AND FAMILY HISTORY:  Unchanged from last visit.   PHYSICAL EXAMINATION:  VITAL SIGNS:  Blood pressure is 129/88, pulse 96,  respirations 20, 93% saturated on room air.  GENERAL:  Kurt Reid is a well-developed, thin adult male who does not  appear in any distress.  He is oriented x3.  His speech is clear.  His  affect is bright.  He is alert, cooperative and pleasant.  He follows  commands easily.   Transitioning from sitting to standing is done quite easily.  His gait  in the room is normal.  He has normal tandem gait and Romberg's test.  He has significant limitations in lumbar motion.  He is able to flex  forward about 30 degrees.  Extension is very limited.  Seated, reflexes  are 2+ at the patellar tendon, 0 at the Achilles tendon.  He reports no  sensory deficits in the lower extremities today.  He has a negative  straight leg raise and his motor strength is 5/5 at hip flexors, knee  extensors, dorsiflexors and plantar flexors as well as EHL.   IMPRESSION:  1. Lumbago with known multilevel degenerative changes within  the      lumbar spine associated with some stenosis and status post multiple      lumbar spine surgeries in the past.  2. Multilevel thoracic degenerative changes.  3. Intermittent epigastric pain, most likely relate to nonsteroidal      anti-inflammatory drugs  use.  I have asked him to discontinue the      use of his ibuprofen, do not take any over the counter Aleve.  I      would like him to start back on his Prilosec and followup with Dr.      Jonny Reid.  He states he will comply.  4. Intermittent bilateral lower extremity sciatic symptoms are      currently not a problem for him at this time.   I have continued to encourage him to stay active walking several times a  day and participating in his household activities.   PLAN:  Will refill the following medications:  1. Avinza  90 mg one p.o. daily #30.  2. Norco 10/325 one p.o. up to four times a day p.r.n. back pain, no      refills.  3. He also is taking Lyrica and Robaxin but has two refills left.   I will see him back next month.  Will consider starting him on some  Celebrex if his abdominal complaints have diminished.  Again, I would  like him to followup with Dr. Jonny Reid for any further epigastric issues he  may have.   Kurt Reid continues to take his medications as prescribe.  He has not  exhibited any aberrant behavior.  His pill counts have been appropriate  and he has been able to maintain a relatively functional life style  despite some significant impairments in his lumbar spine.           ______________________________  Kurt Reid, M.D.     DMK/MedQ  D:  07/01/2007 08:59:31  T:  07/01/2007 09:23:43  Job #:  [id number]   cc:   Dr. Jonny Reid

## 2010-07-17 NOTE — Assessment & Plan Note (Signed)
[name] is a 60 year old gentleman, who is followed in our  [facility] and Re[facility]itative Clinic for multiple pain complaints and was last  seen in our clinic on April 16 by me and had a nursing visit on May, 14,  2010 for refill of his medications.   He had a right shoulder injection on August 15, 2008.  He noted at his  nursing visit on May 14 that his shoulder pain the shot in his shoulder  helped a bunch.   He states he got over 6 weeks relief with it and still helping somewhat  as well.  He is able to move the shoulder and move the arm above his  head at this point yet.   His average pain is about 6 on a scale of 10.  Pain is localized to the  shoulder, especially with overhead activities low back, predominately.   Pain is typically worse with activities and even being sedentary such as  prolonged sitting or standing, improves with rest, heat, medication,  therapy, pacing his activities, medications, and injections.  Gets fair  relief from current medications provided through this clinic.   Medications prescribed through this clinic include the following:  1. Norco 10/325 not more than 4 times per day #1 24 months.  2. Lyrica 25 mg 1 p.o. q.a.m. to p.o. nightly.   Kurt Reid states that he had to reduce the amount of Lyrica he has been  taking.  He states he did not let the office know that he was doing this  due to financial reasons.  He reduced to twice a day to 2 tablets per  day, but his pain returned and says he has restarted taking his  medication as prescribed.   Robaxin 500 on a p.r.n. basis, daily p.r.n. Lidoderm, although he states  that he may not be able to afford this patch anymore, MS Contin 30 mg  twice a day, and Nexium 20 mg twice a day.   FUNCTIONAL STATUS:  He is able to walk 30 minutes at a time.  He can  climb stairs.  He is driving.  He is independent with self-care, helps  out with household tasks at home as well as he is currently not  employed.   Denies problems controlling bowel or bladder.  Admits to depression,  anxiety, indicates no problems with suicidal ideation.   He has been having intermittent problems with his sinuses and he states  an operation for this next month.  I believe by Dr. Shelva Majestic.   No other changes in past medical, social, or family history since last  visit.  No changes in family history.   Exam today, blood pressure is 156/98, pulse 116, respirations 18, 97%  saturated on room air.  Kurt Reid is a well-developed, well-nourished  gentleman, who does not appear in any distress.  He is oriented x3.  Speech is clear.  Affect is bright.  He is alert, cooperative, and  pleasant.  Follows commands without difficulty.  Answers my questions  appropriately.   Cranial nerves coordination are grossly intact.  His reflexes are 1+ in  the upper extremities, biceps, triceps, brachioradialis, 1+ at the  patellar tendon, 0 at the right Achilles tendon, and 2+ at the left  Achilles tendon.  Straight leg raise is negative.  No abnormal tone is  noted.  No clonus is noted.  No tremors are appreciated.   Motor strength is nonfocal, 5/5 in upper and lower extremities without  any focal deficit appreciated.   Transitioning from sitting to standing is done with ease.  Gait in the  room is not antalgic.  Tandem gait, Romberg test are all performed  adequately.   Limitations noted in right shoulder range of motion, abduction to  approximately 130, external rotation 90 degrees, internal rotation 0,  left shoulder is within normal limits.  Lumbar spine had significant  limitations in motion in all planes as well.   IMPRESSION:  1. Overall improvement in right shoulder motion, still lacking      significant internal rotation.  However, abduction is much      improved.  I recommend at this point, hold off on injections until      he is ready to start in a physical therapy program and may consider      injection prior to therapy  should he comply with physical therapy      program.  2. Lumbago, known multilevel degenerative changes in lumbar spine,      status post 4 lumbar surgeries with chronic low back pain.  3. Multilevel thoracic degenerative changes with intermittent thoracic      pain.  4. 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  5. Medical problems include chronic sinusitis, considering surgery      next month for this, history of depression, recent dental problems,      history of stroke, seizures, liver disease, and esophageal reflux      disease.   PLAN:  Refill the following medications for him today, MS Contin 30 mg 1  p.o. b.i.d. #60 and Norco 10/325 not more than 4 times per day #120.  He  does not need refills today Lyrica, Robaxin, Nexium, or Lidoderm.   Nursing visit next month for refill of his medications.  He states that  his physician taking care of his sinus is Dr. Shelva Majestic may give me a call.  I will see him back in 2months', nursing visit next month.           ______________________________  Kurt Reid, M.D.     DMK/MedQ  D:  08/24/2008 09:40:16  T:  08/24/2008 23:29:40  Job #:  [id number]   cc:   Kurt Reid  Fax: [phone]   [name], MD  [address]  [place]  [place] [address]

## 2010-07-17 NOTE — Assessment & Plan Note (Signed)
Mr. Andal is a 60 year old married gentleman being seen in our pain and  rehabilitative clinic for chronic low back pain and intermittent lower  extremity pain.   He is known to have multi-level degenerative changes in his lumbar spine  with stenosis.   He has been operated on in the past by Dr. Gerlene Fee.   He is back in today for refill of his medications.   He states his pain has averaged 6 to 7, to a 10/10 described as  tingling, aching, dull, stabbing, sharp, and burning in nature.  Localized predominantly to the right lower back and radiating down to  the right anterior thigh.   Pain seems to be worse with activities, walking, bending, standing.  Improves with rest.  He reports very little relief with current meds  prescribed by our clinic, and in fact is requesting escalating doses of  his narcotics.   He states his activity is generally significantly effected by his pain.   FUNCTIONAL STATUS:  He is independent with eating, dressing, bathing,  toileting.  He also helps out with household duties, such as meal prep.  Does not engage in shopping and high level household duties.   Mr. Harkin admits to trouble walking, numbness, tremors, tingling.  He  admits to depression and anxiety.  Denies suicidal ideation.  Denies  problems controlling bowel or bladder.   REVIEW OF SYSTEMS:  Otherwise, noncontributory.   The patient has been referred back to Dr. Trudee Grip clinic.  However,  the patient states that Dr. Trudee Grip clinic notes that Mr. Babers owes  them money, and will not see him until financial arrangements have been  taken care of.   MEDICATIONS PRESCRIBED BY THIS CLINIC:  1. Avinza 90 mg 1 p.o. daily.  2. Hydrocodone 10/325 one p.o. q.i.d. p.r.n. back and leg pain.  3. Lyrica 25 mg 1 p.o. b.i.d.   Mr. Shill states there are no other changes in his medications at this  time.  He also takes soma p.r.n., Adderall, Sudafed, Zantac, and Flomax.   EXAM:  Blood pressure  121/88, pulse 117, respirations 20, 95% saturation  on room air.  He is a well-developed, well-nourished gentleman who seems somewhat  agitated today.  He is oriented x3.  His speech is clear.  His affect is  alert.  He is cooperative.   He transitions easily from sitting to standing.  His gait in the room is  not antalgic.  Tandem gait and Romberg test performed adequately.  He  has limitations in all planes with respect to his range of motion.  Palpation in the lumbar spine reveals tenderness bilaterally.   Reflexes are 2+ at the patellar tendons, 0 at the Achilles tendons.  He  has full strength in the lower extremities.  Strength is 5/5 at hip  flexors, knee extensors, dorsiflexors, plantar flexors, EHL bilaterally.  No abnormal tone is noted.  No clonus is noted.  Sensory deficit is  noted by the patient intermittently in the right anterior thigh.   Straight leg raise is negative.   IMPRESSION:  1. Lumbago with radiation to right anterior thigh.  2. 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  3. Multilevel thoracic degenerative changes.  4. Intermittent cervicalgia.  5. Status post multiple lumbar surgeries in the past.  6. Intermittent bilateral lower extremity sciatic symptoms      intermittently.   PLAN:  Will refill the following medications for him.  Will increase his  Lyrica 25 mg 1 p.o. q.8h #90.  Avinza will continue the same 90 mg 1  p.o. daily #30.  Norco 10/325 one p.o. q.i.d. p.r.n. back and leg pain  #120.   Mr. Allemand is requesting escalation of his narcotics.  At this time, I do  not feel that this is going to really change his overall pain scores  dramatically, although it may increase risk to him with respect to  sedation.   We discussed repeating MRI.  He is not really interested in this.  He is  not interested in any kind of injections into his back at this time to  help relieve pain.  He will call Dr. Trudee Grip clinic  and see if they  can work out a financial pain that will allow Mr. Chiappetta to be followed  back up in his clinic.  Will provide Mr. Bilodeau today with some Celebrex  #15 samples from our clinic.  Risks and benefits of this medication were  reviewed with him.  He will not take any other nonsteroidals while he is  on this.  We will see him back in a month.           ______________________________  Brantley Stage, M.D.     DMK/MedQ  D:  04/06/2007 10:45:36  T:  04/06/2007 13:19:04  Job #:  [id number]

## 2010-07-17 NOTE — Assessment & Plan Note (Signed)
Kurt Reid is a 60 year old married gentleman who is being seen in our  pain and rehabilitative clinic for multiple pain complaints.  He is back  in today for a refill of his medications.   His pain is located in the cervical and intrascapular region, throughout  the lumbar region, bilateral lower extremities.  He is known to have  multilevel cervical and lumbar degenerative as well as thoracic disk  changes and spondylosis.  He states his average pain is about a 9 on a  scale of 10, currently in the clinic it is about a 7-8.  His pain is  described as variable, sometimes more constant in nature, sometimes more  intermittent, sharp, burning, dull, stabbing, tingling, aching in  nature, interfering significantly with his activity level and his sleep.  He gets little relief with current meds and in fact he is requesting a  change in his narcotic pain medication today.  He states his pain is  typically worse with activity including walking, bending, and standing,  even inactivity bothers him at times.  Rest improves his pain slightly  as well as heat and medication.   He is able to walk about 20 minutes at a time.  He is able to climb  stairs.  He is able to drive.  He is independent with his self care.  He  does help out with some light household tasks at home.  He denies  problems controlling bowel or bladder.  He does have some trouble  initiating urination, however.  Intermittent numbness and tingling in  the lower extremities are noted.  He admits to depression and anxiety.  He denies suicidal ideation.  He was recently placed in the last 3  months on Pristiq.   REVIEW OF SYSTEMS:  Is otherwise noncontributory today.   1. Dr. Vernie Ammons is involved with his care with respect to urology.  2. Dr. Foy Guadalajara at [facility] has been seeing him for liver      problems.  I do not have notes from either and will request them today.   SOCIAL HISTORY:  Otherwise unchanged.   FAMILY HISTORY:   Otherwise unchanged.   PHYSICAL EXAMINATION:  VITAL SIGNS:  Blood pressure is 161/94, pulse  112, respirations 18, 98% saturated on room air.  GENERAL:  He is a well-developed, well-nourished gentleman, who appears  his stated age.  He is oriented x3.  His speech is clear.  Affect is  bright.  He is alert, cooperative, pleasant.  He follows commands  without any difficulty.  MUSCULOSKELETAL:  Transitioning from sit-to-stand is done easily.  Gait  in the room is stable.  Tandem gait and Romberg test are performed  adequately.  Limitations noted in the cervical and lumbar range of  motion.  Reflexes are intact in the upper extremities 1 plus at the  patella tendons, 0 at the Achilles tendons bilaterally.  Motor strength  is excellent in the lower extremities 5/5 at hip flexors, knee  extensors, dorsiflexors, plantar flexors, EHL, as well as knee flexors.  No abnormal tone is noted.  No clonus is noted in the lower extremities.   IMPRESSION:  1. Lumbago.  2. 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  3. Multilevel thoracic degenerative changes.  4. Intermittent cervicalgia.  5. Status post multiple lumbar surgeries in the past, totaling 4.  6. Bilateral upper extremities intermittent neuropathic pain and      bilateral lower extremity neuropathic  pain intermittently.   After a long discussion, approximately 35 minutes was spent with Mr.  Reid regarding his treatment options at this point.  He is not at all  interested in following up with a surgeon.  He understands he does have  some herniated disks in his back and some narrowing which can impact  nerve roots which could affect sensation and motor strength.  At this  point, he would like to continue management of his pain with oral  medications.  He is not at all interested in any kind of epidural  injection or spinal injection.   PLAN:  1. We will try him this month on 30 mg q.8 h. of MS Contin.   2. We will discontinue his Avinza.  He had 16 days left of his Avinza.      This was disposed of with witnesses today prior to him receiving a      new prescription.  3. He was given a 14-day supply of Norco 10/325, one p.o. q.i.d.      p.r.n.  4. He is being tried on Robaxin 500 mg, one p.o. q.p.m., #30, no      refills.  5. He was given a prescription for Lidoderm.  We will see how he does      on the Robaxin.  We may try him on Flexeril next month.  6. A request was made for notes from Dr. Foy Guadalajara as well as Dr. Vernie Ammons      his urologist.  7. We will see him back in a month.           ______________________________  Brantley Stage, M.D.     DMK/MedQ  D:  01/09/2007 13:02:08  T:  01/09/2007 22:24:36  Job #:  [id number]   cc:   Loraine Leriche C. Vernie Ammons, M.D.  Fax: [phone]   Dr. Karrie Doffing, [facility]

## 2010-07-17 NOTE — Assessment & Plan Note (Signed)
[name] is a pleasant 60 year old married gentleman who is  followed in our [facility] for multiple pain  complaints.  His primary care doctor is Dr. Oliver Barre.   Kurt Reid is back in today for brief recheck and refill of his pain  medications.  He has multiple pain complaints including multilevel  lumbar degenerative changes.  He is status post 4 lumbar surgeries in  the past.  He has multilevel thoracic degenerative changes and he has  intermittent neuropathic lower extremity pain.   Last month he underwent multiple tooth extractions per Dr. Annabell Howells.   Kurt Reid notes that he tolerated the procedures fairly well.  He is  complaining of some sinus drainage this morning, however.   His average pain in the mid-back, low back, lower extremity is about 6  on a scale of 10.  He states that the cold weather has been bothering  his joint more in the last few weeks.  He gets fair relief with current  meds.   Medications, which are prescribed to this clinic include Norco 10/325  one p.o. daily, Avinza 90 mg one p.o. daily, Lyrica 25 mg one p.o. q.  a.m. and two p.o. nightly, p.r.n., Robaxin and, p.r.n. Lidoderm.   FUNCTIONAL STATUS:  Kurt Reid can walk about 30 minutes at a time, he  can climb stairs and drive.  He is independent with self-care and higher  level household tasks.   Denies problems controlling bowel or bladder.  Denies any new weakness  or trouble walking.  Denies depression or suicidal ideation, reports  occasional anxiety.   Otherwise review of systems noncontributory other than sinus drainage.  I have asked him to follow up with his primary care physician for this  problem.   No other changes in past medical, social, or family history, his  multiple tooth extraction was done February 21, 2008, Dr. Annabell Howells.   Exam today blood pressure is 130/88, pulse 108, respiration 18, 95%  saturated on room air.   Kurt Reid is a well-developed thin adult  gentleman who does not appear  in any distress.   He is oriented x3.  Speech is clear.  His affect is bright.  He is  alert, cooperative, and pleasant.  He follows commands without  difficulty and answers all questions appropriately.   Cranial nerves and coordination are grossly intact.  His reflexes are  slightly brisk in the lower extremities.  No abnormal tone is noted.  However, no clonus is noted, toes are downgoing.  He reports no sensory  deficit today.  He has good motor strength 5/5 at hip flexors, knee  extensors, dorsiflexors, plantar flexors.  Straight leg raise is  negative.   Transitioning from sitting to standing is done without difficulty.  His  gait is not antalgic.  Tandem gait and Romberg test are all performed  adequately.  He has significant limitations and lumbar range of motion  in all planes and some mild tenderness in lumbar paraspinal musculature.   IMPRESSION:  1. Lumbago with known multilevel degenerative changes in lumbar spine      status post 4 lumbar surgeries.  2. Multilevel thoracic degenerative changes.  3. Intermittent neuropathic lower extremity pain.  4. Recent significant dental procedures done, February 21, 2008, Dr.      Annabell Howells.   PLAN:  Refill the following medications for Kurt Reid, Kurt Reid 10/325 one  p.o. q.i.d., Avinza 90 mg one p.o. daily, Lyrica 25 mg one p.o. q.  a.m.  and two p.o. nightly, Robaxin 500 mg one p.o. daily to b.i.d. on a  p.r.n. basis #30 with one refill, and p.r.n. Lidoderm.   I recommend he follow back up with his primary care doctor for sinus  drainage and continue to maintain contact with Dr. Annabell Howells regarding his  dental problems.   Kurt Reid is stable on medications provided, prescribed through this  clinic.  He is not reporting any problems with oversedation,  constipation, and gait instability from these medications.  He reports  overall improvement in his functional status and reports decreased  problems with pain  with their use.   He continues to maintain a relatively functional lifestyle despite  multiple functional deficits and chronic pain.  We will see him back in  1 month for recheck.           ______________________________  Brantley Stage, M.D.     DMK/MedQ  D:  03/18/2008 08:59:23  T:  03/18/2008 21:39:19  Job #:  [id number]   cc:   [name], MD  [address]  [place]  [place] [address]

## 2010-07-17 NOTE — Assessment & Plan Note (Signed)
[name] is a 60 year old married gentleman who is followed in our [facility] for multiple chronic pain complaint.  His  primary care doctor is Dr. Oliver Barre.   [name] is back in today for refill of his medications.   He states he has continued to have problems with sinus infection, which  is getting in the way of him having some oral surgery.  He is going to  follow up with a ear, nose and throat specialist in the next month or  so.   He also states that his insurance is no longer paying for his Avinza.  He would like to be switched to something else long-acting to help with  his chronic pain and his low back and legs.   His average pain is about 6 on a scale of 10.  Pain is described as  intermittent tingling, aching, dull, stabbing, burning, and sharp in  nature, depending on the day.  Sleep tends to be poor.  Pain is worse  with activities and improves with rest.  He is pacing his  activities as  well as the medication.  He reports fair relief with current meds.   Medications from this clinic include the following:  1. Norco 10/325 four times a day.  2. Avinza 90 mg daily.  3. Lyrica 25 mg q.a.m. 2 nightly.  4. Robaxin 500 p.r.n. once to twice a day.  5. Lidoderm on a p.r.n. basis as well.   FUNCTIONAL STATUS:  He is able to walk without assistance.  He can walk  30 minutes at times.  He was able to climb stairs and drive.  He is a  high functioning gentleman.  Overall, he is independent with feeding,  bathing, dressing, toileting,does help out with household duties  including laundry and dishes cleaning up.   REVIEW OF SYSTEMS:  Positive for occasional weakness, tingling, spasms,  confusion, depression, and anxiety.   Also noted for nausea, poor appetite, and respiratory infections.   No other changes in the past medical, social, and family history since  our last visit.   PHYSICAL EXAMINATION:  VITAL SIGNS:  Today, blood pressure is 130/89,  pulse 96, respiration 18, and 98% saturation on room air.  GENERAL:  He is a tall and thin gentleman, who does not appear in any  distress.  NEUROLOGIC:  He is oriented x3.  Speech is clear.  Affect is bright.  He  is alert, cooperative and pleasant.  Follows commands without any  difficulty.  Answers questions appropriately.  Cranial nerves and  coordination are intact.  EXTREMITIES:  Reflexes are 1+ at patellar tendons and diminished at the  ankles bilaterally.  He has good strength 5/5 at hip flexors, knee  extensors, dorsiflexors, and plantar flexors.   No new sensory deficits are appreciated.  He does have some patchy areas  in both lower extremities.  However, straight leg raise is negative.   Transitioning from sitting-to-standing is done with ease.  Gait in the  room is symmetric and non antalgic.  Tandem gait and Romberg test are  all performed adequately.   He has some tenderness at the distal end of his surgical scar over his  lumbar spine and also some mild tenderness in the paraspinal musculature  at that site as well.  No erythema or redness is noted.   IMPRESSION:  1. 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  2. Multilevel thoracic degenerative changes.  3. Intermittent neuropathic lower extremity pain.  4. Medical problems include recent dental procedures done.  5. Chronic sinusitis.   PLAN:  [name] has been discontinued by his insurance company.  We will switch him to oxycodone extended release 20 mg 1 p.o. q.12 h.  We will refill his Norco 10/325 one p.o. q.i.d.  He does not need  refills on his Lyrica, Robaxin, or Lidoderm today.  We will see him back  in a month.  I anticipate switching him back to Avinza 15 mg twice a day  next month.  We will obtain urine drug screen within the next 2-3  months, after he is reestablished on his morphine.   [name] takes his medications as  prescribed.  I have not observed any  aberrant behavior with his narcotic pain medication.  He reports overall  good relief and is able to maintain a relatively functional lifestyle  despite the chronic pain and functional deficits related to that.           ______________________________  Brantley Stage, M.D.     DMK/MedQ  D:  04/15/2008 09:33:55  T:  04/15/2008 21:34:44  Job #:  [id number]

## 2010-07-17 NOTE — Assessment & Plan Note (Signed)
Kurt Reid is a 60 year old married gentleman who is followed in our [facility] for multiple chronic pain complaints.  His  primary care physician is Dr. Oliver Barre.   [name] has returned to our clinic for a brief recheck and refill of  his medications.   In the interim, he has been having some dental work done.  He is  currently being treated for sinusitis which apparently needs to be, he  has to complete treatment prior to further completion of his dental  work.  He states he has been stressed because he has had his daughter  and her family move in with her into their home now.   Dr. Jonny Ruiz has adjusted his psychiatric medications initially discontinued  his Pristiq and put him on Wellbutrin.  He states he did not do well  with this and now he discontinued his Wellbutrin and is back on Pristiq  and feeling somewhat better.   He had to help his daughter's family move in.  He has been lifting some  light boxes, but reports some right shoulder pain which is new for him  and just some generalized soreness from recent move.  He also attributes  some of this soreness to Levaquin which he had recently taken for his  sinusitis.   Average pain has been about 7 or 8 on scale of 10.  Sleep has been poor.  He gets little relief with current meds.  He describes his pain mainly  right shoulder is the main problem.  He does have continued chronic low  back pain, some intermittent left upper extremity pain, and bilateral  lower extremity pain, which is not new.   FUNCTIONAL STATUS:  The patient is able to walk about 20 minutes at a  time.  He can climb stairs.  He drives.  He is independent with self  care.  He is, overall, relatively high functioning gentleman.  He takes  his dog for walk.   REVIEW OF SYSTEMS:  Positive for depression and anxiety.  Denies  suicidal ideation.  Admits to some nausea attributes this to some of the  antibiotics, he has been taking.  Poor  appetite.   Physicians currently involved in the care include primary care physician  Dr. Oliver Barre and Dr. Shelva Majestic for his sinus infections.   No changes in past medical, social, or family history since last visit  other than that previously stated.   The patient notes that he has had a grandson, now living with him.  I  have discussed again the importance of keeping his medications  locked  up and he verbally states he understands that these medications are  potentially lethal and needs to be kept away from children.  He states  he will obtain a lockbox to keep the meds away from his from children.   Medications provided through this clinic include;  1. Norco 10/325 up to four times a day.  2. Lyrica 25 mg one in the morning and two at night.  3. P.r.n. Robaxin.  4. Lidoderm p.r.n.  5. Oxycodone extended release 20 mg twice a day.   On exam today, blood pressure is 151/96, pulse 114, respiratory rate is  18, and 96% saturated on room air.  He is a thin adult gentleman who  appears in his stated age.  He is oriented x3.  Speech is clear.  Affect  is alert.  He is cooperative and pleasant.  Follows commands without  difficulty, seems to be nervous today and more anxious than usual.  His  cranial nerves are grossly intact.  Coordination is intact.  Reflexes  are 2+ in the upper extremities, 2+ at the patellar tendons, 2+ at the  right Achilles tendon, and 1+ left Achilles tendon.  He has a tremor  throughout.  Fine tremor throughout the bilateral upper and lower  extremities.  Motor strength, however, is 5/5 without focal deficits.  He has limitations in lumbar range of motion.  Cervical range of motion  is relatively well preserved, right shoulder range of motion has  limitations, and internal rotation essentially not more than 5 degrees,  and internal rotation are noted today.  He has painful arc and positive  supraspinatus test.  Tenderness over the anterior superior humeral  head.   He transitions from sitting to standing without difficulty and his gait  is somewhat brisk today.  He is able to walk with normal gait, non  antalgic.  Tandem gait and Romberg test are performed adequately.   IMPRESSION:  1. Lumbago with known multilevel degenerative changes in the lumbar      spine, status post 4 lumbar surgeries.  2. Multilevel thoracic degenerative changes.  3. 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  4. New right shoulder supraspinatus rotator cuff tendonitis.  5. Medical problems at this time include a chronic sinusitis on      antibiotic therapy, currently followed by Dr. Shelva Majestic and history of      depression, recent dental problems, history of stroke, seizures,      liver disease, and gastroesophageal reflux disease.   PLAN:  We will switch him today from oxycodone ER 20 mg twice a day to  MS Contin 30 mg b.i.d., Norco 10/325 up to four times per day.  He does  not need a refill on his Lyrica or his Lidoderm.   Plan UDS, we will check urine drug screen next visit.  If his shoulder  is not improving, may consider an injection and physical therapy.  I  have given him some exercises to maintain his current range of motion at  this time.   Overall, Mr. Dues has been taking his medications as prescribed.  He  has been exhibited aberrant behavior with the use.  He needs to be  understand the importance of locking his medications up and keeping his  medications in a secure location.  I will see him back in 5 weeks.           ______________________________  Brantley Stage, M.D.     DMK/MedQ  D:  05/13/2008 09:10:53  T:  05/13/2008 21:00:17  Job #:  [id number]

## 2010-07-17 NOTE — Assessment & Plan Note (Signed)
[name] is a 60 year old gentleman who is followed in our [facility] with multiple chronic pain complaints.   PRIMARY CARE PHYSICIAN:  [name], MD   Kurt Reid was last seen by me on May 13, 2008.  He is following in our  [facility] for multiple chronic pain complaints  including cervicalgia, low back pain, and leg pain, and in the last  month, he has had some problems with his right shoulder felt to be  rotator cuff tendinitis.   He is back in today.  He is still having some problems with his feet.  He is on some antibiotics.  Apparently, he has infection in his jaw that  needs to be addressed prior to further dental work.   Otherwise, over the last month, Kurt Reid states he has been relatively  healthy, average pain is about 6 on a scale of 10.  He reports fair  relief with the meds.  Pain is worse with activities in general,  improves with rest, heat therapy, pacing his activities, and  medications.   FUNCTIONAL STATUS:  He is able to walk about 40 minutes at a time.  He  is able to climb stairs and drive.  He is independent with self care,  helps out with some activities around the house, and defers heavier  household tasks, however.   Denies problems with controlling bowel or bladder.  Admits to some  depression and anxiety and denies suicidal ideation.   He reports some problems with recent sinus infection, had a CT scan for  that, Dr. Shelva Majestic.   Otherwise, past medical, social, and family history unchanged.  He lives  with his wife, daughter, and son-in-law and grandson.   Exam today, his blood pressure is 141/71, pulse 108, respirations 18,  and 96% saturated on room air.  He is a thin and tall adult gentleman  who does not appear in any distress.  He is oriented x3.  Speech is  clear.  Affect is bright.  He is alert, cooperative, and pleasant.  Follows commands without difficulty, answers questions  appropriately.   Cranial nerves and coordination are grossly intact.  His reflexes are 2+  in the upper extremities, 2+ at the patellar tendons, 2+ at the right  Achilles tendon, slightly diminished at the left Achilles tendon, plus  fine tremor in the upper and lower extremities.   Motor strength 5/5 without focal deficits, significant limitations in  range of motion and cervical range as well as lumbar range, also right  shoulder decreased range of motion with respect to abduction, has a  painful arc.  Hawkins test is positive.  Jog test is positive.  Limitations and internal rotation are noted, some tenderness over the  anterosuperior humeral head.   Gait is without difficulty today, transitions from sitting to standing  easily, nonantalgic, and normal heel-toe gait is appreciated.   IMPRESSION:  1. Worsening of right rotator cuff tendinitis.  The patient was      complaining significantly about this right shoulder today, he is      requesting injection, states his last injection was about 10 years      ago.  2. Lumbago with known multilevel degenerative changes in the lumbar      spine, status post 4 lumbar surgeries.  3. Multilevel thoracic degenerative changes.  4. 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  5. Medical problems at this time include chronic sinusitis on  antibiotic therapy, Dr. Shelva Majestic.  History of depression, recent dental      problems, history of stroke, seizures, liver disease, and      gastroesophageal reflux disease.   PLAN:  Consent for right shoulder injection was obtained.  Risks and  benefits of using a steroid with lidocaine was reviewed with Kurt Reid.  He would like to proceed with the injection, 1 mL of Kenalog plus 4 mL  of 1% lidocaine were injected into the subacromial bursa without  complications.   The patient reported immediate release and expressed pleasure over the  fact that this shoulder no longer was hurting.  I  reviewed  discharge  instructions with them and care of the shoulder over the next couple of  days, may ice it tonight if it is bothering him, and his status should  improve in the next couple of days, notify clinic if there is redness,  warmth, fever.   We will refill following medications for him; MS Contin 30 mg 1 p.o.  q.12 h., #60 and Norco 10/325 up to 4 times a day, #120.  We will see  him back in a month.  We would like to consider physical therapy for  this gentleman; however, he is not interested in pursuing it at this  point.  Should he require another injection, would insist on some  therapy afterward.  We will see him back in a month.           ______________________________  Brantley Stage, M.D.     DMK/MedQ  D:  06/20/2008 08:09:12  T:  06/20/2008 23:49:23  Job #:  [id number]

## 2010-07-17 NOTE — Assessment & Plan Note (Signed)
Kurt Reid is a 60 year old married gentleman who is accompanied by his  wife this morning.   He states he has moved into a new place over the last month.  Subsequently, during the moving process, his back has flared up,  especially on the right.  He has some numbness in the anterior and  lateral thigh.   The pain is worse with most movements, improved with rest and heat.  He  has been sleeping poorly.  However, he is drinking up to 6 Coca-[employer]as a  day as well.   Average pain is between a 6/10 and a 9/10.  Currently in the office  about a 6/10.   He was given a Medrol Dosepak yesterday by Dr. Littie Deeds.  He has taken 1  day of the pack so far.   He denies any problems controlling bowel or bladder.  Denies any new  weakness.  Denies depression.  Denies suicidal ideation.  Does admit to  some anxiety.   No changes otherwise in past social, medical, and family history since  last visit.   ALLERGIES:  LATEX and MERCURY.   MEDICATIONS PRESCRIBED BY THIS CLINIC:  1. MS-Contin 30 mg q.8h #90 last month.  2. P.r.n. Lidoderm.  3. Robaxin 500 mg p.r.n.  4. Lyrica 25 mg twice a day.  5. Norco 10/325 up to 4 times per day on a p.r.n. basis.   EXAM:  Blood pressure is 134/93, pulse 136, respirations 18, 98%  saturation on room air.  He is a well-developed, well-nourished gentleman who does not appear in  any distress.  He is oriented x3.  Speech is clear.  His affect is  bright.  He is alert, cooperative, and pleasant, and he follows commands  without any difficulty.   He transitions from sitting to standing easily.  His gait in the room is  not antalgic.  He is able to perform tandem gait, Romberg test  adequately.  He has significant limitations in forward flexion as well  as extension.  Complains of pain with both of these movements today.  Seated, reflexes at the patellar tendons is 2+.  Symmetric right and  left Achilles tendon reflexes are diminished.  No abnormal tone is  noted.   No clonus is noted.  His motor strength is 5/5 at hip flexors,  knee extensors, dorsiflexors, plantar flexors, and EHL.  He does  complain of some increased back pain when he exerts to manual muscle  testing in especially right hip flexors.   IMPRESSION:  1. Flare-up of low back pain.  2. 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  3. Multilevel thoracic degenerative changes.  4. Intermittent cervicalgia.  5. Status post multiple lumbar surgeries in the past.  6. Bilateral upper extremity intermittent neuropathic and bilateral      lower extremity neuropathic pain intermittently.  Right leg pain is      predominant feature.   PLAN:  Again, discussion with him and his wife regarding possible  followup with a neurosurgeon to evaluate his spine further for possible  surgical procedures to help with his low back and leg pain.  Also  discussed possible re-imaging studies at this time.   The patient and his wife would like to defer further workup and  neurosurgical referral for now.  He will finish up his Medrol Dosepak.  We will refill the following medications for him.  Avinza 1 p.o. daily  #30.  Robaxin 500 mg  1 p.o. q. p.m. p.r.n. back spasm #30.  Norco 10/325  one p.o. q.i.d. p.r.n. back pain #120.  No refills on the Avinza or the  hydrocodone.   Also discussed the use of epidural steroid injections to help manage leg  pain, facet medial branch block, RF for low back pain.  Mr. Winkels is not  interested in anything invasive at this point as well.  We will see him  back in a month.  He has been stable on these medications.  He was  trialed on 90 mg a day of MS-Contin versus the Avinza.  He found the  Avinza to be a little more helpful in managing his pain without the  peaks and troughs of the MS-Contin.           ______________________________  Brantley Stage, M.D.     DMK/MedQ  D:  02/09/2007 09:11:01  T:  02/09/2007 09:51:13  Job #:   [id number]

## 2010-07-17 NOTE — Assessment & Plan Note (Signed)
HISTORY:  Kurt Reid is a 60 year old married gentleman who is being seen  in our [facility] for multiple pain complaints.  He  is back in today for a refill of his medications.   The pain is localized in the cervical region, thoracic region, lumbar  region, occasionally goes down to the lower extremity posteriorly.   He states that his average pain is about a 4 on a scale of 10 and is  noted to be aching, stabbing, sharp in nature.  Sleep tends to be poor.  The pain is worse in the morning and toward the end of the day and  somewhat into the evening.   His pain is worse with activity, improves with rest, heat, pacing his  activities and medication.  He gets a little relief with the current  medications that are provided by this clinic.   MEDICATIONS PROVIDED BY THIS CLINIC INCLUDE:  1. Norco 10/325 up to 4 times a day.  2. Avinza 90 mg once a day.  3. He also takes Lyrica 25 mg twice a day.  4. Prilosec 20 mg daily.   FUNCTIONAL HISTORY:  He is able to walk about 60 minutes at a time.  He  is able to climb stairs and drive.  He is independent with his self-  care.  He has been moving into a different home recently over the last  few months and has been quite active with this.   REVIEW OF SYSTEMS:  He admits to some anxiety and occasional dizziness.  He denies suicidal ideation.  He has had some recent shortness of breath  which he attributes to starting Flomax over the last week or so, he has  noted some fast heartbeats as well as dry mouth.   OTHER PHYSICIANS INCLUDE:  Physicians involved in Kurt Reid' care  include Dr. Melvyn Novas, Dr. Vernie Ammons, and he is also going to be following up  with a liver specialist at [facility]   PAST HISTORY:  No changes in the past medical, social, or family history  since his last visit.  His last visit was August 28, 2006.  He has had two  nursing visits in the interim.   PHYSICAL EXAMINATION:  On exam today, blood pressure is 115/80,  pulse  106, respirations 18, and 95% saturated on room air.  He is a well-  developed, well-nourished gentleman who does not appear in any distress.   He is oriented times three.  Speech is clear, affect is bright, he is  alert, he is cooperative, pleasant, follows commands without any  difficulty.   He transitions from sitting to standing quickly.  Gait in the room is  not antalgic.  Tandem gait and Romberg tests are performed adequately.   Limitations are noted in lumbar motion in all planes.   Reflexes at the patella tendons are 2+, at the Achilles tendon is 1+, no  abnormal tone is noted.  Motor strength is good without focal deficit in  the lower extremities.  Sensory testing is not done today.   IMPRESSION:  1. Lumbago.  2. Multi-lumbar degenerative changes with stenosis.  3. Multilevel thoracic degenerative changes.  4. Intermittent cervicalgia.  5. Status post multiple lumbar surgeries in the past, a total of 4,      although there is no documentation regarding all of these surgeries      in this chart.  6. Bilateral upper extremity neuropathic pain intermittently, and      bilateral lower  extremity neuropathic pain intermittently.   PLAN:  We will refill the following medications today for Kurt Reid:  1. Avinza 90 mg one p.o. daily number 30.  2. Prilosec 20 mg one p.o. daily, number 30.  3. Over the last couple of months, Kurt Reid has approximately 10      pills left from June, July, and August.  We will refill his      medication today, Norco 10/325 mg one p.o. four times a day p.r.n.      back pain number 90.  With the extra 30 pills that will bring him      up to 120.  4. Next month nursing visit, refill Norco 10/325 one p.o. four times a      day number 120 which is his usual dose.  5. He had some dental appointments scheduled however, this did not      take place and as a result he has some extra hydrocodone left over.   His pill counts have been accurate, he  takes his medication as  prescribed.  He did not display any aberrant behavior with his  medications.  We will see him back in two months, nursing visit next  month.           ______________________________  Brantley Stage, M.D.     DMK/MedQ  D:  11/19/2006 12:06:28  T:  11/19/2006 15:21:01  Job #:  [id number]

## 2010-07-17 NOTE — Assessment & Plan Note (Signed)
Kurt Reid is a 60 year old married gentleman who has multiple pain  complaints including upper thoracic and low back pain.  He was last seen  by me on April 06, 2007 and he is back in today for a refill of his  medications.   He states his average pain is about a 5 on a scale of 10.  His pain  moderately interferes with his activities.  Sleep is fair. He is getting  fair relief with current medications that he is being treated with.   The pain is typically localized to an area between the scapula and low  back radiating to bilateral thigh regions.   The pain is described as sharp, dull, tingling, burning and aching in  nature.   He is able to walk about 40 minutes at a time.  He is able to climb  stairs and drive.   He is a high functioning individual, independent with all of his self  care as well as high level household activities including grocery  shopping and helping clean around the home.   Bowel and bladder are without problems today.  He admits to intermittent  numbness and tingling.  He denies suicidal ideation.   REVIEW OF SYSTEMS:  Review of systems is otherwise noncontributory.   PAST MEDICAL/SOCIAL/FAMILY HISTORY:  Unchanged since last visit.   MEDICATIONS:  The medications provided by this clinic include:  1. Lyrica 25 mg one p.o. t.i.d.  2. Robaxin 500 mg up to 1 per day.  3. Norco 10/325 four times a day on a p.r.n. basis.  4. Avinza 90 mg one p.o. daily, #30.   He has reported no problems with these medications including the Lyrica,  which was recently started.  He finds that it does seem to help his pain  and, in fact, his pain scores are down from last month between a 6 and a  10 down to a 5 now.   PHYSICAL EXAMINATION:  VITAL SIGNS:  Blood pressure 148/89, pulse 114,  respirations 18, 95% saturated on room air.  GENERAL:  He is a tall, well-developed, well-nourished gentleman who  does not appear in any distress.  He is oriented x3. Speech is clear.  Affect is bright.  He is alert, cooperative and pleasant.  He follows  commands without difficulty.   Transitioning from sitting to standing is done with ease and quickly  today. No pain behaviors are observed.  Gait is normal in the room.  Tandem gait, Romberg's test are performed adequately.   He has significant limitations of lumbar motion especially in forward  flexion and extension.  Seated reflexes are 2+ at the patellar tendons,  diminished at the Achilles tendons bilaterally.  Motor strength is quite  good, however, without focal weakness.   Straight leg raise is negative. He has a mild  tremor in his upper  extremities.   Palpation of the lumbar spine does not particularly aggravate him at all  today.   IMPRESSION:  1. Lumbago with radiation of pain to the bilateral thighs, overall      improved from last month.  2. 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  3. Multilevel thoracic degenerative changes.  4. Intermittent cervicalgia, currently not a problem at this time.  5. Status post multiple lumbar surgeries in the past.  6. Intermittent bilateral lower extremity sciatic symptoms, currently      not a problem at this time as well.  PLAN:  Refill the following medications for him:  1. Avinza 90 mg one p.o. daily, #30.  2. Norco 10/325 mg one p.o. q.i.d. p.r.n. back pain, #120.  3. Lyrica 25 mg one p.o. q.a.m., two p.o. nightly, #90.  4. Robaxin 500 mg one p.o. daily p.r.n. back spasm, #30.  (Three      refills on the last two medications).   [name] states at some point in the next couple of months he may  undergo some dental work and may need a few extra pain pills to cover  him for his dental procedures.   He states he will give me the name of the dentist and phone numbers so  that I can coordinate pain medications for him during this period.   We will see him back in one month.            ______________________________  Brantley Stage, M.D.     DMK/MedQ  D:  06/03/2007 09:49:02  T:  06/03/2007 10:37:32  Job #:  [id number]

## 2010-07-17 NOTE — Assessment & Plan Note (Signed)
Kurt Reid is a 60 year old married gentleman who is being seen in our  [facility] for chronic low back pain.   He is back in today for a refill of his medications.   He states his average pain is between 5-8 on a scale of 10.  The pain is  described as intermittent, sharp, burning, dull, stabbing, tingling and  aching in nature localized from the scapula throughout the lumbar spine  radiating through the right hip and into the right thigh region.   The pain is typically worse with walking, bending, sitting and standing,  improves with rest, heat, pacing his activities and medications.  Sleep  tends to be poor.  Pain is fairly constant throughout the day.  No  particular time of day is worse.  States it interferes significantly  with general activity, relationship with others and enjoyment life.   He is able to walk 20-30 minutes at a time.  Is able to climb stairs.  Is able to drive.  He is independent with his self-care and higher level  activities in the home.  Denies problems controlling bowel or bladder.  Denies suicidal ideation.  Admits to some anxiety.  Denies depression.   REVIEW OF SYSTEMS:  Otherwise negative.   No changes in past medical, social or family history since last visit.  He has been healthy.   MEDICATIONS:  Provided by our clinic include the following:  1. Avinza 90 mg one p.o. daily #30.  2. Norco 10/325 one p.o. q.i.d. p.r.n. back pain #120.  3. Robaxin 500 mg one p.o. q.p.m. p.r.n. back spasm #30.  He has a      refill from last month yet.  4. Lyrica 25 mg one p.o. b.i.d.  5. Lidoderm on a p.r.n. basis.   PHYSICAL EXAMINATION:  On exam today:  VITAL SIGNS:  Blood pressure is 140/86, pulse 118, respirations 20, 97%  saturated on room air.  GENERAL:  He is a well-developed, well-nourished gentleman.  He does not  appear in any distress.  He is oriented x3.  Speech is clear.  Affect is  bright.  He is alert, cooperative and pleasant.   Follows commands  without difficulty.   He transitions from sitting to standing easily.  Gait in the room is non-  antalgic.  Limitations are noted in all planes with lumbar range of  motion.  He has some paraspinal muscle spasm bilaterally.  Reflexes are  2+ at the patella tendons, decreased at the Achilles tendon.  Straight  leg raise is negative.  Motor strength is excellent in the lower  extremities without focal weakness.   No clonus is noted.  No abnormal tone is noted.   IMPRESSION:  1. Lumbago.  2. 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  3. Multilevel thoracic degenerative changes.  4. Intermittent cervicalgia.  5. Status post multiple lumbar surgeries in the past.  6. Intermittent bilateral lower extremity sciatic-type symptoms.   PLAN:  We will refill the following medications:  Norco 10/325 one p.o.  q.i.d. p.r.n. back pain #120 and Avinza 90 mg one p.o. daily #30.  He  states he does not need refills on his other medications at this time.  However, reviewing his chart,  it appears we may need to call in Lyrica  for him as well.  I will see him back in a month.           ______________________________  Brantley Stage, M.D.     DMK/MedQ  D:  03/09/2007 10:59:39  T:  03/09/2007 11:28:20  Job #:  [id number]

## 2010-07-20 NOTE — Op Note (Signed)
[place]  Patient:    Kurt Reid, Kurt Reid Visit Number: [id number] MRN: [id number]          Service Type: DSU Location: 3000 [id number] Attending Physician:  Gerald Dexter Dictated by:   Reinaldo Meeker, M.D. Proc. Date: 03/27/01 Admit Date:  03/27/2001 Discharge Date: 03/28/2001                             Operative Report  PREOPERATIVE DIAGNOSIS:  Herniated disk at L4-5, right, recurrent.  POSTOPERATIVE DIAGNOSIS:  Herniated disk at L4-5, right, recurrent.  PROCEDURE:  Right L4-5 redo microdiskectomy.  SURGEON:  Reinaldo Meeker, M.D.  ASSISTANT:  Donalee Citrin, Montez Hageman., M.D.  SECONDARY PROCEDURE:  Microdissection of L5 nerve root and L4-5 disk.  DESCRIPTION OF PROCEDURE:  After being placed in the prone position, the patients back was prepped and draped in the usual sterile fashion.  A localizing x-ray was taken prior to incision to identify the L4-5 level. The previous incision was opened over the L4 and L5 spinous processes.  Using Bovie cutting current, the incision was carried down to the spinous processes. Subperiosteal dissection was then carried out on the right side of the spinous processes and lamina.  The previous laminotomy defect was identified.  An x-ray was taken to confirm approach to the L4-5 level and this was correct.  A high speed drill was used to widen the laminotomy and residual bone was removed with the Kerrison punch.  The microscope was then draped and brought in the field and used the remainder of the case.  Using microdissection technique, the lateral aspect of the thecal sac and L5 nerve root were identified as it went around the pedicle.  Dissection superior showed a huge fragment of disk material, and this was removed in a piecemeal fashion.  This gave excellent decompression of the thecal sac, and as more of the disk material was removed, more mobilization was possible, and more disk was then able to be  removed.  When all the disk material had been removed that could be removed, inspection was carried out in all directions for any evidence of residual compression and none could be identified.  The L5 nerve root was found to be freely mobile at this time.  Large amounts of irrigation were carried out and bleeding controlled with bipolar coagulation and Gelfoam.  The wound was then closed using interrupted Vicryl in the muscle, fascia, subcutaneous, and subcuticular tissues, and Dermabond and Steri-Strips on the skin.  A sterile dressing was then applied and the patient was extubated and taken to the recovery room in stable condition. Dictated by:   Reinaldo Meeker, M.D. Attending Physician:  Gerald Dexter DD:  03/27/01 TD:  03/29/01 Job: [id number]

## 2010-07-20 NOTE — Assessment & Plan Note (Signed)
[name] is a pleasant 60 year old gentleman who has chronic  pain syndrome and multiple chronic pain complaints including  cervicalgia, lumbago, bilateral lower extremity pain.   He is back in today for refill of his pain medications.  He was last  seen by me in August 24, 2008.  He has had nursing visits in the interim  for pill counts.  He states that over the last month, his finances have  been tight and he has been unable to afford to fill his Lyrica or  Lidoderm.  He is requesting to go back on gabapentin.   He had some recent sinus surgery in the last month.   He is requesting a refill on his hydrocodone as well as his morphine  today.   Average pain is about 7 on a scale of 10.  Sleep tends be poor.  Pain is  worse with activities in general, improves with rest and medications.  He is pacing in his activities.  He reports a little relief with current  meds.   He is able to walk without assistance.  He can walk about 50 minutes at  time, he can climb stairs and drive.  He is independent with self care,  needs assistance with high-level heavier household tasks.  Admits to  occasional weakness, numbness in the lower extremities, trouble walking,  depression, anxiety.  Denies suicidal ideation.   REVIEW OF SYSTEMS:  Notable for weight gain.   Kurt Reid attributes this to eating his cinnamon rolls.  He is  edentulous.  Currently is having trouble eating foods that are require  more chewing.   Past medical, social, family history otherwise unchanged.   Medications which are prescribed through this clinic include  1. Norco 10/325 up to four times a day.  2. Lyrica 25 mg 1 p.o. q.a.m. 2 p.o. at bedtime, was discontinued by      the patient last month due to financial reasons.  He no longer      takes Robaxin he no longer takes Lidoderm due to financial      constraints as well.  3. MS Contin 30 mg one p.o. b.i.d.  4. Nexium 20 mg p.o. daily.  I requested that Kurt Reid  have this      medication filled by Dr. Jonny Ruiz after this next month.   PHYSICAL EXAMINATION:  VITAL SIGNS:  Blood pressure is 153/98, pulse is  115, respiration 16, 97% saturation on room air.  GENERAL:  He is a well-developed, well-nourished gentleman who has  obviously gained some weight since my last visit with him.   He is oriented x3.  Speech is clear and affect is bright.  He is alert,  cooperative, pleasant.  Follows commands without difficulty and answers  questions appropriately.   Cranial nerves and coordination are intact.  His reflexes are slightly  brisk in the lower extremities, 0 at the right ankle.  No abnormal tone  or clonus or tremors are appreciated.   Motor strength is excellent both upper and lower extremities without  focal deficits.  Transitioning from sitting to standing is done with  ease.  Gait in room is stable and also evaluated him in the hall today,  non antalgic.  Limitations in lumbar motion in all planes are noted,  however.  Sensory deficits in the lower extremities in patchy  distribution.   IMPRESSION:  1. Multilevel thoracic degenerative changes.  2. Intermittent neuropathic lower extremity pain.  3. Lumbago with known multilevel  degenerative change in the lumbar      spine status post 4 lumbar surgeries.   Medical problems include chronic sinusitis status post recent sinus  surgery by Dr. Shelva Majestic, history of depression, recent dental problems,  history of stroke, seizures, liver disease, gastroesophageal reflux  disease.   PLAN:  We will refill the following medications for Kurt Reid today,  hydrocodone 10/325 up to four times per day as well as his Morphine 30  mg one p.o. q.12 h, Nexium 20 mg p.o. daily p.r.n. and we will start  Neurontin 300 mg titrating up to four times today.   I encouraged him to follow back up with Dr. Jonny Ruiz to assess his medical  problems.  We have requested that he also obtain his Nexium per Dr.  Jonny Ruiz.  He is  currently not on any kind of nonsteroidal at this time.   UDS is also requested today.  Kurt Reid states he will comply as well.  Pill counts are appropriate to take this medications as prescribed and  see him back in 3 months.           ______________________________  Brantley Stage, M.D.     DMK/MedQ  D:  11/14/2008 12:25:52  T:  11/15/2008 04:03:45  Job #:  [id number]

## 2010-07-20 NOTE — Procedures (Signed)
NAME:  Kurt Reid, Kurt Reid NO.:  [id number]   MEDICAL RECORD NO.:  [id number]                   PATIENT TYPE:  REC   LOCATION:  [facility]                                  FACILITY:  [facility]   PHYSICIAN:  Celene Kras, MD                     DATE OF BIRTH:  10-09-1950   DATE OF PROCEDURE:  08/16/2003  DATE OF DISCHARGE:                                 OPERATIVE REPORT   [facility].  I evaluated,  reviewed the health and history form and 14-point review of systems.   1. Hardeep has been through four surgeries, inclusive of decompressive     laminectomy, and describes persistent biomechanical pain.  He was,     however, a pretty good responder to the facet injection at the medial     branch with improved range of motion, functional indices, endurance and     quality of life indices.  We would expect 25 to 50%, which is pretty much     what we got and coming in today with discussions as to moving forward     with radiofrequency neural ablation.  This would be a more definitive     procedure, reinforcing a block, improving function, range of motion, and     quality of life indices.  2. Do not believe further imaging or diagnostics are warranted.  3. In the interim, as a bridging procedure, we will not be able to get to     the RF for about a month.  I am going to go ahead and at least trial a     singular translaminar injection.  This is justified as it covers a global     position of his pain, right and left side, primarily and the interlaminar     injection should afford [place] the opportunity to obtain relief without     escalating narcotic based pain medications.   He has no advancing neurological features.   OBJECTIVE:  Diffuse paralumbar myofascial discomfort, pain over PSIS and  notable pain on extension with Gaenslen's and Patrick's equivocal.  Primarily mechanical but does have a modest radicular component that is  not  new.  No new neurological findings, motor, sensory, reflexes.   IMPRESSION:  1. Degenerative spinal disease lumbar spine.  2. Post laminectomy syndrome.  3. Facet syndrome.   PLAN:  Translaminar injection.  Follow-up with radiofrequency neural  ablation.  He is consented.   The patient taken to the fluoroscopy suite and placed in the prone position.  Back prepped and draped in usual fashion.  Using a Hustead needle, I  advanced to the L5-S1without evidence of CSF, heme or paresthesia.  Test  block uneventfully.  Follow with 40 mg Aristocort and flushed the needle.   The patient tolerated the procedure well.  No complications from our  procedure.  Appropriate recovery.  Lifestyle enhancements reviewed.  No  complications identified.                                                Celene Kras, MD    HH/MEDQ  D:  08/16/2003 11:36:35  T:  08/16/2003 12:18:03  Job:  [id number]

## 2010-07-20 NOTE — Discharge Summary (Signed)
[place]. [facility]  Patient:    Kurt Reid, Kurt Reid Visit Number: [id number] MRN: [id number]          Service Type: MED Location: 3000 3025 01 Attending Physician:  Kurt Reid Dictated by:   Kurt Reid, M.D. Admit Date:  09/02/2001 Discharge Date: 09/03/2001   CC:         Kurt Reid, M.D.   Discharge Summary  Patient Address: [address] [place], [place]. [address]  INDICATIONS: This is one of several [facility] admissions for this 60 year old right handed white married male from [place], [place]., admitted from the [facility] ER and transferred to [facility] because of lack of beds for evaluation of two witnessed seizures.  HISTORY OF PRESENT ILLNESS: [name] has a history of depression related to concerns about the possibility of losing his job and at one point, was suicidal. He was seen by the [facility] in June of 2003 and admitted to [facility]. He was discharged on August 17, 2001 by Dr. Kathrynn Reid, taking Wellbutrin 150 mg t.i.d., Ambien 10 mg q.h.s., and Oxycontin 10 mg b.i.d., which was a taper from Oxycontin 20 mg b.i.d. On these medications at home, he found difficulty sleeping and was taking more Ambien than he normally should, having run out by August 22, 2001 according to his wife. He also exhibited bizarre behavior, being in his shed, yelling and throwing things, breaking bottles, and her having finding him as early as 4:30 AM with scratches on his head. After discontinuing the Ambien, his behavior seemed to change into being more normal. The day, August 02, 2001 just prior to admission about 3:30 PM, he was lying in bed and his son heard a noise and found the patient jerking. This lasted approximately minutes and there was no urinary or bowel incontinence. About 10:30 PM on September 01, 2001, he was again lying in bed and had a second episode associated  with tongue biting without loss of urine or bowel control. He had no warning prior to either episode. He denies any known history of seizures and denies any history of micropsia, de ja vu, strange odors, or taste. He has been very jittery as mentioned, confused at times, and up at night according to his wife. He had been giving himself his medications and by description from his wife and bottle check, the only medication that seems to have been incorrectly taken was his Ambien which he had run out of much earlier than expected. He is a former alcohol drinker and usually drinks about two six packs per month of beer but has had none in the several weeks prior to admission. He has had no drug use in twenty years but at one point, did use Cocaine and Heroin. He denies any recent headache or focal neurologic symptoms.  PAST MEDICAL HISTORY: 1. Bipolar disorder. 2. Attention deficit disorder. 3. Depression. 4. Suicidal ideation. 5. Lumbar laminectomies times four in 1986, 2001, 2002, and 2003. 6. Status post right knee surgery. 7. Zygomatic surgery following a horse accident/injury at age 27.  ALLERGIES: None known.  ADMISSION MEDICATIONS: 1. Wellbutrin 150 mg t.i.d. 2. Ambien 10 mg q.d. which had been discontinued approximately eight to    nine days prior to admission because he ran out. 3. Oxycontin 20 mg b.i.d. to 10 mg b.i.d. and then discontinued two weeks    prior to admission. Said that he was only taking Oxycodone  5 mg q.d. to    t.i.d. prior to admission. 4. History of Atarol use which he has not taken in several weeks for    attention deficit disorder.  SOCIAL HISTORY: Denies any alcohol intake for two weeks. He does not smoke cigarettes. He is educated through the 12th grade. He works as a Programme researcher, broadcasting/film/video.  FAMILY HISTORY: He is married with three children, two of whom are bipolar.  PHYSICAL EXAMINATION: At the time of admission in the ER at [facility] revealed the  following:  GENERAL: A well-developed, well-nourished white male.  VITAL SIGNS: Blood pressure in right and left arms of 130/80, heart rate of 103, respiratory rate 20. He was afebrile.  MENTAL STATUS: Alert and oriented times three but somewhat invasive in answering questions. He followed commands. Cranial nerve exam and motor exam and sensory exam revealed deep tendon reflexes were unremarkable.  LABORATORY DATA: Initial white count was 14,000 with H&H of 17.0 and 50.0. Platelet count 336,000. Sodium 139, potassium 4.1, chloride 108, CO2 26, BUN 9, creatinine 1.2, glucose 113, calcium 9.1, alkaphos slightly high with a valve of 121. Other LFTs were all normal. His alcohol level was less than 5. A drug screen was positive for amphetamines (as mentioned in the past he has taken Atarol for ADD). His repeat WBC count was 12,800 with H&H of 15.1 and 44.7. Platelet count 263,000. UA did show 0-2 white blood cells and 7-10 red blood cells. In retrospect, he noted that he had had some cloudy urine prior to admission. He did receive a catheter in the ER at [facility].  DIAGNOSTIC STUDIES: CT scan of the brain was normal. MRI study of the brain with contrast enhancement showed no abnormalities. EEG was normal. EKG showed normal sinus rhythm and was a normal EKG. Telemetry in the hospital showed no significant abnormalities.  HOSPITAL COURSE: The patient complained of some right upper quadrant abdominal pain in the [facility] ER. He reported that he had had some cloudy urine without significant pain for one week prior to admission. He then complained later that the pain seemed to have moved to the left side. His discomfort resolved with the use of Zantac. An ultrasound of the gallbladder was unremarkable, obtained to rule out cholecystitis because of an elevated alkaphos of 121 and complaint of abdominal pain. The results of the studies including the EEG, MRI, telemetry, and urine  were discussed with the patient  and his wife by telephone. It was felt that he may have had a urinary tract infection, as the cause of his urinalysis findings but a repeat clean catch urine and culture are pending. Seizure precautions were gone over with the patient and his wife and he is not to drive a car for one month.  IMPRESSION: 1. Seizures times two, most likely medication related, probably Wellbutrin    or some form of withdrawal from Ambien. (Code 245.10). 2. Depression (Code 311). 3. Bipolar disorder (Code 296.7). 4. Attention deficit disorder with hyperactivity (Code 314.01). 5. Status post lumbar laminectomies times four at the L4-L5 level (Code    272.4). 6. Possible urinary tract infection (Code 599.0).  PLAN: Discharge the patient on no driving times one month. Work to be determined by myself and Dr. ___, whom he is to see in three weeks. He is to maintain seizure precautions. He is to return to our office in one month to see Demetrio Lapping, P.A.  DISCHARGE MEDICATIONS: 1. Septra DS one p.o. b.i.d. times five  days. 2. Remeron 30 mg q.h.s. 3. Oxycodone 5 mg one q.d. to t.i.d. for pain. 4. Zantac 150 mg p.o. b.i.d.  DISCHARGE CONDITION: Improved from his prehospital status.  SPECIAL INSTRUCTIONS: A repeat UA will be obtained depending on results of the discharge urine specimen. Dictated by:   Kurt Reid, M.D. Attending Physician:  Kurt Reid DD:  09/03/01 TD:  09/06/01 Job: [id number]
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
## 2010-07-20 NOTE — H&P (Signed)
[facility]  Patient:    Kurt Reid, Kurt Reid Visit Number: [id number] MRN: [id number]          Service Type: MED Location: 3000 3025 01 Attending Physician:  Erich Montane Dictated by:   Genene Churn. Love, M.D. Admit Date:  09/02/2001 Discharge Date: 09/03/2001   CC:         Dub Amis, M.D.   History and Physical  ADDRESS:  [address] [place], [place]. DATE OF BIRTH:  01-11-1951.  REASON FOR ADMISSION:  This 60 year old right-handed white married male from [place], [place], is admitted from the emergency room for evaluation of two suspected seizures.  HISTORY OF PRESENT ILLNESS:  Kurt Reid has been under a great deal of stress recently.  From the history of his wife, he most likely has a bipolar disorder diagnosed five to 10 years ago and has been diagnosed as having attention deficit disorder.  He has been on Adderall in the past.  He has a history of lower back pain and has been on a series of medications, including OxyContin 20 mg b.i.d., which he has been on since prior to January 2003 when he had his fourth back operation by Reinaldo Meeker, M.D., a neurosurgeon in [place]. On the OxyContin 20 mg b.i.d. it had been noted that his words were slurred at work and he had some ataxia.  He was to undergo a drug screen for evaluation and was unable to void.  He became depressed that he may lose his job, became suicidal in ideation, and was seen at the [facility] and referred to [facility], where he was admitted by Dr. Kathrynn Running until August 17, 2001, at which time he was discharged on Wellbutrin 150 mg t.i.d., Ambien 10 mg q.h.s., and OxyContin tapered to 10 mg b.i.d.  Approximately two weeks ago Kurt Reid discontinued the OxyContin and began oxycodone 5 mg up to t.i.d.  His Wellbutrin was taken erratically.  He states that he took one yesterday and one today but was  generally taking about 150 mg t.i.d.  His Ambien was doubled up, and he ran out of it at least one week ago, having taken 30 since August 17, 2001.  During this time period following discharge from the hospital, he had become very jittery at night, would have some jerks. He had some bizarre behavior when he got up and was in his shed on Saturday night, August 22, 2001, at which time he was heard to be making loud noises and being destructive in the shed, yelling and screaming and breaking things at 4:30 a.m.  He talked about getting a gun.  He was yelling at his wife.  He was found in the shed with a cut over his forehead.  He at that time apparently discontinued the Ambien.  Although he has been calmer, he has still had some jitteriness according to his wife.  He was noted about 3:30 p.m. the date of September 01, 2001, while in bed to make a noise and was then found to have jerking activity thought to represent a seizure.  He was confused afterwards.  About 10:30 p.m. September 01, 2001, he had a similar episode of being found in bed with jerking activity.  This time he bit his tongue.  There was no urinary or bowel incontinence.  Afterwards he had some mild bifrontal headache.  He denies any drug abuse and states that he has been taking  his Wellbutrin 150 mg one to three per day, discontinued his Ambien approximately seven days ago, and has been taking his oxycodone 5 mg approximately one per day.  He denies any alcohol use, though in the past he has been a heavy drinker, 12 pints per month, but for some time has been drinking approximately two six packs of beer per month and none in over three weeks.  He has a past history of drug use with cocaine, heroin, and acid at age 55 or 59 but none since that time.  He has had no recent head or neck trauma other than the cut that was found on his head when he was in the shed on Saturday, August 22, 2001.  He has not complained of any loss of vision, double vision,  swallowing problems, etc.  PAST MEDICAL HISTORY:  Significant for bipolar disorder, ADHD, depression, suicidal ideation, lumbar laminectomies x4 in 1986, 2001, 2002, and 2003.  He is status post right knee surgery.  He has had zygomatic surgery after a horse accident at age 71.  ALLERGIES:  He has no known history of allergies.  MEDICATIONS:  Wellbutrin supposedly 150 mg t.i.d., Ambien 10 mg q.d. but none in eight days, OxyContin 20 mg b.i.d. to 10 mg b.i.d. discontinued two weeks ago, oxycodone 5 mg one q.d. to t.i.d.  SOCIAL HISTORY:  He has had no alcohol in two weeks.  He does not smoke cigarettes.  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  He is educated through the 12th grade.  FAMILY HISTORY:  His mother is in her 25s.  Dad is 33, living and well.  He is an only child.  He has three children, a daughter 66 who is bipolar, a son 60 who is bipolar, and a son 52 who has depression.  PHYSICAL EXAMINATION:  VITAL SIGNS:  His blood pressure in the right and left arm is 130/80, heart rate 103 and regular, respiratory rate 20, temperature afebrile.  GENERAL:  A well-developed white male in no acute distress, very slow in answering questions, somewhat evasive.  NEUROLOGIC:  Mental status:  Alert and oriented x3.  He knew the president and vice-president but not the governor.  He knew the number of nickels in a dollar but not in $1.25 or $1.35.  He could remember two of three objects at five minutes.  Cranial nerve examination revealed visual fields full, discs flat, venous pulsations seen, extraocular movements full, corneals present. Facial sensation equal, no facial motor asymmetry.  Hearing present, air conduction greater than bone conduction.  Tongue and uvula in the midline, and gag is present.  Sternocleidomastoid and trapezius testing normal.  Motor examination:  5/5 strength proximally and distally in the upper and lower extremities without  any evidence of drift.  Coordination testing normal.  Sensory examination intact to pinprick, touch, position, and vibration.  Deep tendon reflexes 1-2+.  Plantar responses downgoing.  Gait examination not examined.  HEENT:  Tympanic membranes clear.  Mouth fair with upper false teeth.  CHEST:  Lungs clear to auscultation.  CARDIAC:  No murmurs.  There was no cyanosis, clubbing, or edema.  ABDOMEN:  There is no enlargement of the liver, spleen, or kidneys.  GENITOURINARY:  He was circumcised.  LABORATORY DATA:  CT scan of the brain normal.  White blood cell count 14,000, hemoglobin 17.0, hematocrit 50.0, platelets 336,000.  Sodium 139, potassium 4.1, chloride 108, CO2 content 26, BUN 9, creatinine 1.2, glucose 113,  calcium 9.1.  Alkaline phosphatase elevated at 121.  Alcohol level less than 5.  Drug screen positive for amphetamines.  IMPRESSION: 1. Seizure x2, most likely drug-related, 345.10. 2. Depression, 311. 3. Bipolar disorder, 296.7. 4. Attention deficit hyperactivity disorder, code 314.01. 5. Status post lumbar laminectomies x4, 272.4. 6. Former alcohol use, questionable now, none recently.  PLAN:  Admit the patient for further evaluation. Dictated by:   Genene Churn. Love, M.D. Attending Physician:  Erich Montane DD:  09/02/01 TD:  09/04/01 Job: [id number]
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
## 2010-07-21 ENCOUNTER — Ambulatory Visit: Payer: Medicare Other | Admitting: Family Medicine

## 2010-07-23 ENCOUNTER — Encounter: Payer: Self-pay | Admitting: Internal Medicine

## 2010-07-23 ENCOUNTER — Ambulatory Visit (INDEPENDENT_AMBULATORY_CARE_PROVIDER_SITE_OTHER): Payer: Medicare Other | Admitting: Internal Medicine

## 2010-07-23 VITALS — BP 138/78 | HR 100 | Temp 99.3°F | Ht 73.0 in | Wt 212.5 lb

## 2010-07-23 DIAGNOSIS — Z7189 Other specified counseling: Secondary | ICD-10-CM | POA: Insufficient documentation

## 2010-07-23 DIAGNOSIS — M545 Low back pain, unspecified: Secondary | ICD-10-CM

## 2010-07-23 DIAGNOSIS — J029 Acute pharyngitis, unspecified: Secondary | ICD-10-CM

## 2010-07-23 DIAGNOSIS — Z0000 Encounter for general adult medical examination without abnormal findings: Secondary | ICD-10-CM

## 2010-07-23 DIAGNOSIS — J309 Allergic rhinitis, unspecified: Secondary | ICD-10-CM

## 2010-07-23 DIAGNOSIS — G894 Chronic pain syndrome: Secondary | ICD-10-CM

## 2010-07-23 MED ORDER — MORPHINE SULFATE CR 30 MG PO TB12: 30.0000 mg | ORAL_TABLET | Freq: Two times a day (BID) | ORAL | Status: DC

## 2010-07-23 MED ORDER — AZITHROMYCIN 250 MG PO TABS: ORAL_TABLET | ORAL | Status: AC

## 2010-07-23 MED ORDER — FLUTICASONE PROPIONATE 50 MCG/ACT NA SUSP: 2.0000 | Freq: Every day | NASAL | Status: DC

## 2010-07-23 NOTE — Patient Instructions (Signed)
Take all new medications as prescribed. Continue all other medications as before Please return in 6 mo with Lab testing done 3-5 days before  

## 2010-07-23 NOTE — Assessment & Plan Note (Addendum)
Mild to mod, cant r/ o strep with recent exposure, for zpack

## 2010-07-23 NOTE — Progress Notes (Signed)
Subjective:    Patient ID: Kurt Reid, male    DOB: [birth year], 60 y.o.   MRN: [id number]  HPI Here with acute onset mild to mod 2-3 days ST, HA, general weakness and malaise, but Pt denies chest pain, increased sob or doe, wheezing, orthopnea, PND, increased LE swelling, palpitations, dizziness or syncope. Pt denies new neurological symptoms such as new headache, or facial or extremity weakness or numbness  Pt denies polydipsia, polyuria  Pt states overall good compliance with meds, trying to follow lower cholesterol diet, wt overall stable but little exercise however.   Grandson ill with similar, now on antibx for strep throat.  Does have several wks ongoing nasal allergy symptoms with clear congestion, itch and sneeze, without fever, pain, ST, cough or wheezing. Pt continues to have recurring LBP without change in severity, bowel or bladder change, fever, wt loss,  worsening LE pain/numbness/weakness, gait change or falls.  Past Medical History  Diagnosis Date  . Depression   . Bipolar 1 disorder   . ADHD (attention deficit hyperactivity disorder)   . GERD (gastroesophageal reflux disease)   . Hepatitis C   . Hyperlipidemia   . Low back pain     chronic  . Allergic rhinitis   . Seizure disorder     H/O  . Benign prostatic hypertrophy   . Migraine   . ETOH abuse     hx  . Nephrolithiasis     hx  . Sinusitis     recurrent  . Diverticulosis of colon   . Anxiety   . ADHD 09/23/2006  . ALLERGIC RHINITIS 09/23/2006  . ANXIETY 04/19/2009  . BENIGN PROSTATIC HYPERTROPHY 11/02/2007  . BIPOLAR AFFECTIVE DISORDER 09/23/2006  . Chronic pain syndrome 01/18/2009  . DEPRESSION 09/23/2006  . DIVERTICULOSIS, COLON 04/19/2009  . ESOPHAGEAL VARICES 04/19/2009  . GERD 09/23/2006  . HEPATITIS C 09/23/2006  . HYPERLIPIDEMIA 09/23/2006  . INSOMNIA-SLEEP DISORDER-UNSPEC 04/19/2009  . LOW BACK PAIN 09/23/2006  . NEPHROLITHIASIS, HX OF 11/02/2007  . SEIZURE DISORDER 09/23/2006   Past Surgical History   Procedure Date  . Back surgury     x 5  . Knee surgury     x 1  . Cheekbone     hx of fx  . Tonsillectomy   . S/p sinus surgury 2010    Dr. Zara Chess    reports that he has been smoking.  He does not have any smokeless tobacco history on file. He reports that he does not drink alcohol or use illicit drugs. family history includes Asthma in his daughter; Depression in his mother; Hyperlipidemia in his mother; and Stroke in his mother. Allergies  Allergen Reactions  . Duloxetine     REACTION: memory dysfunction, dizziness  . Latex   . Mercury   . Sulfonamide Derivatives    Current Outpatient Prescriptions on File Prior to Visit  Medication Sig Dispense Refill  . amphetamine-dextroamphetamine (ADDERALL XR, 10MG ,) 10 MG 24 hr capsule Take 1 capsule (10 mg total) by mouth daily.  30 capsule  0  . carisoprodol (SOMA) 350 MG tablet Take 350 mg by mouth 2 (two) times daily as needed.        . desvenlafaxine (PRISTIQ) 50 MG 24 hr tablet Take 1 tablet (50 mg total) by mouth daily.  90 tablet  3  . gabapentin (NEURONTIN) 300 MG capsule Take 300 mg by mouth 4 (four) times daily.        Marland Kitchen HYDROcodone-acetaminophen (NORCO) 7.5-325 MG per tablet  Take 1 tablet by mouth every 6 (six) hours as needed.        . ondansetron (ZOFRAN) 4 MG tablet Take 4 mg by mouth every 8 (eight) hours as needed. For nausea and vomiting       . DISCONTD: morphine (MS CONTIN) 30 MG 12 hr tablet Take 1 tablet (30 mg total) by mouth every 12 (twelve) hours.  60 tablet  0  . aspirin (ASPIRIN EC) 81 MG EC tablet Take 81 mg by mouth daily.         Review of Systems All otherwise neg per pt     Objective:   Physical Exam BP 138/78  Pulse 100  Temp(Src) 99.3 F (37.4 C) (Oral)  Ht 6\' 1"  (1.854 m)  Wt 212 lb 8 oz (96.389 kg)  BMI 28.04 kg/m2  SpO2 98% Physical Exam  VS noted, mild ill  Constitutional: Pt appears well-developed and well-nourished.  HENT: Head: Normocephalic.  Right Ear: External ear normal.    Left Ear: External ear normal.  Bilat tm's mild erythema.  Sinus nontender.  Pharynx severe erythema with mild exudate Eyes: Conjunctivae and EOM are normal. Pupils are equal, round, and reactive to light.  Neck: Normal range of motion. Neck supple.  Cardiovascular: Normal rate and regular rhythm.   Pulmonary/Chest: Effort normal and breath sounds normal.  Neurological: Pt is alert. No cranial nerve deficit.  Skin: Skin is warm. No erythema.  Psychiatric: Pt behavior is normal. Thought content normal.         Assessment & Plan:

## 2010-07-23 NOTE — Assessment & Plan Note (Signed)
Mod uncontrolled - to add the flonase asd

## 2010-07-23 NOTE — Assessment & Plan Note (Signed)
stable overall by hx and exam, most recent lab reviewed with pt, and pt to continue medical treatment as before, for med refills today

## 2010-07-27 ENCOUNTER — Other Ambulatory Visit: Payer: Self-pay

## 2010-07-27 DIAGNOSIS — F909 Attention-deficit hyperactivity disorder, unspecified type: Secondary | ICD-10-CM

## 2010-07-27 MED ORDER — AMPHETAMINE-DEXTROAMPHET ER 10 MG PO CP24: 10.0000 mg | ORAL_CAPSULE | Freq: Every day | ORAL | Status: DC

## 2010-07-31 NOTE — Telephone Encounter (Signed)
Pt informed, Rx in cabinet for pt pick up  

## 2010-08-07 ENCOUNTER — Other Ambulatory Visit: Payer: Self-pay

## 2010-08-07 NOTE — Telephone Encounter (Signed)
A user error has taken place: encounter opened in error, closed for administrative reasons.

## 2010-08-20 ENCOUNTER — Telehealth: Payer: Self-pay | Admitting: Internal Medicine

## 2010-08-20 ENCOUNTER — Other Ambulatory Visit: Payer: Self-pay

## 2010-08-20 DIAGNOSIS — M545 Low back pain, unspecified: Secondary | ICD-10-CM

## 2010-08-20 DIAGNOSIS — G8929 Other chronic pain: Secondary | ICD-10-CM

## 2010-08-20 MED ORDER — CARISOPRODOL 350 MG PO TABS: 350.0000 mg | ORAL_TABLET | Freq: Two times a day (BID) | ORAL | Status: DC | PRN

## 2010-08-20 MED ORDER — MORPHINE SULFATE CR 30 MG PO TB12: 30.0000 mg | ORAL_TABLET | Freq: Two times a day (BID) | ORAL | Status: DC

## 2010-08-20 NOTE — Telephone Encounter (Signed)
Gus, pharmacist advised of early refill per Dr Jonny Ruiz, [facility] also refilled verbally. Pt advised of same and requesting referral to Pain mgmt local to him. Pt also wanted to apologize to Dr Jonny Ruiz and staff for medication overuse.

## 2010-08-20 NOTE — Telephone Encounter (Signed)
Done hardcopy to dahlia/LIM B  

## 2010-08-20 NOTE — Telephone Encounter (Signed)
Noted  Will refer to pain management

## 2010-08-20 NOTE — Telephone Encounter (Signed)
Pt advised that MD declined pain med refill but is requesting refill of Soma, Last written to be filled 04/23/2010 #60 x 3

## 2010-08-20 NOTE — Telephone Encounter (Signed)
Pt called requesting refill of pain medications to be filled 06/19 (1 day early). Pt states he took an extra pill due to increase in pain. Please advise.

## 2010-08-20 NOTE — Telephone Encounter (Signed)
No, already has rx I have done for him at last visit, with last one dated to be filled July 12

## 2010-08-28 ENCOUNTER — Ambulatory Visit: Payer: Medicare Other | Admitting: Internal Medicine

## 2010-08-31 ENCOUNTER — Ambulatory Visit (INDEPENDENT_AMBULATORY_CARE_PROVIDER_SITE_OTHER): Payer: Medicare Other | Admitting: Internal Medicine

## 2010-08-31 ENCOUNTER — Encounter: Payer: Self-pay | Admitting: Internal Medicine

## 2010-08-31 VITALS — BP 132/72 | HR 97 | Temp 100.4°F | Ht 73.0 in | Wt 215.8 lb

## 2010-08-31 DIAGNOSIS — F3289 Other specified depressive episodes: Secondary | ICD-10-CM

## 2010-08-31 DIAGNOSIS — F329 Major depressive disorder, single episode, unspecified: Secondary | ICD-10-CM

## 2010-08-31 DIAGNOSIS — F909 Attention-deficit hyperactivity disorder, unspecified type: Secondary | ICD-10-CM

## 2010-08-31 DIAGNOSIS — G894 Chronic pain syndrome: Secondary | ICD-10-CM

## 2010-08-31 DIAGNOSIS — J019 Acute sinusitis, unspecified: Secondary | ICD-10-CM

## 2010-08-31 MED ORDER — HYDROCODONE-APAP-DIETARY PROD 10-325 MG PO MISC: ORAL | Status: DC

## 2010-08-31 MED ORDER — LEVOFLOXACIN 500 MG PO TABS: 500.0000 mg | ORAL_TABLET | Freq: Every day | ORAL | Status: DC

## 2010-08-31 MED ORDER — AMPHETAMINE-DEXTROAMPHET ER 10 MG PO CP24: 10.0000 mg | ORAL_CAPSULE | Freq: Every day | ORAL | Status: DC

## 2010-08-31 MED ORDER — OMEPRAZOLE 20 MG PO CPDR: 20.0000 mg | DELAYED_RELEASE_CAPSULE | Freq: Every day | ORAL | Status: DC

## 2010-08-31 MED ORDER — AMOXICILLIN 500 MG PO CAPS: 500.0000 mg | ORAL_CAPSULE | Freq: Two times a day (BID) | ORAL | Status: AC

## 2010-08-31 MED ORDER — VENLAFAXINE HCL ER 150 MG PO CP24: ORAL_CAPSULE | ORAL | Status: DC

## 2010-08-31 NOTE — Patient Instructions (Addendum)
Take all new medications as prescribed - the antibiotic (levaquin), and the higher strength hydrocodone Stop the pristiq Start the generic effexor at 2 pills per day Your adderall was refilled today Continue all other medications as before Please call if you need further refills Please call if the sinus pain does not improve in the next wk, as you may need to see ENT

## 2010-09-02 ENCOUNTER — Encounter: Payer: Self-pay | Admitting: Internal Medicine

## 2010-09-02 NOTE — Progress Notes (Signed)
Subjective:    Patient ID: Kurt Reid, male    DOB: 1950/07/14, 60 y.o.   MRN: [id number]  HPI   Here with 3 days acute onset fever, facial pain, pressure, general weakness and malaise, and greenish d/c, with slight ST, but little to no cough and Pt denies chest pain, increased sob or doe, wheezing, orthopnea, PND, increased LE swelling, palpitations, dizziness or syncope.  Pain overall not well controlled as pain med dont seem to be working as well, though functionally he is doing the same .  Some meds are too expensive.  Denies worsening depressive symptoms, suicidal ideation, or panic, though has ongoing anxiety, not increased recently.   ADD meds working well, with ability to focus and task completion.   Past Medical History  Diagnosis Date  . Depression   . Bipolar 1 disorder   . ADHD (attention deficit hyperactivity disorder)   . GERD (gastroesophageal reflux disease)   . Hepatitis C   . Hyperlipidemia   . Low back pain     chronic  . Allergic rhinitis   . Seizure disorder     H/O  . Benign prostatic hypertrophy   . Migraine   . ETOH abuse     hx  . Nephrolithiasis     hx  . Sinusitis     recurrent  . Diverticulosis of colon   . Anxiety   . ADHD 09/23/2006  . ALLERGIC RHINITIS 09/23/2006  . ANXIETY 04/19/2009  . BENIGN PROSTATIC HYPERTROPHY 11/02/2007  . BIPOLAR AFFECTIVE DISORDER 09/23/2006  . Chronic pain syndrome 01/18/2009  . DEPRESSION 09/23/2006  . DIVERTICULOSIS, COLON 04/19/2009  . ESOPHAGEAL VARICES 04/19/2009  . GERD 09/23/2006  . HEPATITIS C 09/23/2006  . HYPERLIPIDEMIA 09/23/2006  . INSOMNIA-SLEEP DISORDER-UNSPEC 04/19/2009  . LOW BACK PAIN 09/23/2006  . NEPHROLITHIASIS, HX OF 11/02/2007  . SEIZURE DISORDER 09/23/2006   Past Surgical History  Procedure Date  . Back surgury     x 5  . Knee surgury     x 1  . Cheekbone     hx of fx  . Tonsillectomy   . S/p sinus surgury 2010    Dr. Zara Chess    reports that he has been smoking.  He does not have any  smokeless tobacco history on file. He reports that he does not drink alcohol or use illicit drugs. family history includes Asthma in his daughter; Dementia in his father; Depression in his mother; Hyperlipidemia in his mother; Hypertension in his mother; and Stroke in his mother. Allergies  Allergen Reactions  . Duloxetine     REACTION: memory dysfunction, dizziness  . Latex   . Levaquin Nausea Only  . Mercury   . Sulfonamide Derivatives    Current Outpatient Prescriptions on File Prior to Visit  Medication Sig Dispense Refill  . aspirin (ASPIRIN EC) 81 MG EC tablet Take 81 mg by mouth daily.        . carisoprodol (SOMA) 350 MG tablet Take 1 tablet (350 mg total) by mouth 2 (two) times daily as needed.  60 tablet  2  . fluticasone (FLONASE) 50 MCG/ACT nasal spray 2 sprays by Nasal route daily.  16 g  2  . gabapentin (NEURONTIN) 300 MG capsule Take 300 mg by mouth 4 (four) times daily.        Marland Kitchen morphine (MS CONTIN) 30 MG 12 hr tablet Take 1 tablet (30 mg total) by mouth every 12 (twelve) hours. To fill September 21, 2010  60 tablet  0  . ondansetron (ZOFRAN) 4 MG tablet Take 4 mg by mouth every 8 (eight) hours as needed. For nausea and vomiting        Review of Systems Review of Systems  Constitutional: Negative for diaphoresis and unexpected weight change.  HENT: Negative for drooling and tinnitus.   Eyes: Negative for photophobia and visual disturbance.  Respiratory: Negative for choking and stridor.   Gastrointestinal: Negative for vomiting and blood in stool.  Genitourinary: Negative for hematuria and decreased urine volume.        Objective:   Physical Exam BP 132/72  Pulse 97  Temp(Src) 100.4 F (38 C) (Oral)  Ht 6\' 1"  (1.854 m)  Wt 215 lb 12 oz (97.864 kg)  BMI 28.46 kg/m2  SpO2 96% Physical Exam  VS noted, mild ill Constitutional: Pt appears well-developed and well-nourished.  HENT: Head: Normocephalic.  Right Ear: External ear normal.  Left Ear: External ear normal.    Bilat tm's mild erythema.  Sinus tender bilat.  Pharynx mild erythema Eyes: Conjunctivae and EOM are normal. Pupils are equal, round, and reactive to light.  Neck: Normal range of motion. Neck supple.  Cardiovascular: Normal rate and regular rhythm.   Pulmonary/Chest: Effort normal and breath sounds normal.  Abd:  Soft, NT, non-distended, + BS Neurological: Pt is alert. No cranial nerve deficit.  Skin: Skin is warm. No erythema.  Psychiatric: Pt behavior is normal. Thought content normal.  Mild dysphoric and 1+ nervous        Assessment & Plan:

## 2010-09-02 NOTE — Assessment & Plan Note (Signed)
Mild to mod, for antibx course,  to f/u any worsening symptoms or concerns 

## 2010-09-02 NOTE — Assessment & Plan Note (Signed)
pristiq too expensive - to try change to generic effexor

## 2010-09-02 NOTE — Assessment & Plan Note (Signed)
Uncontroleld, meds adjusted as per EMR; to be given only monthly refills as he has some difficulty managing his written scripts

## 2010-09-02 NOTE — Assessment & Plan Note (Signed)
stable overall by hx and exam,and pt to continue medical treatment as before  For med refill

## 2010-09-15 ENCOUNTER — Other Ambulatory Visit: Payer: Self-pay | Admitting: Internal Medicine

## 2010-09-28 ENCOUNTER — Other Ambulatory Visit: Payer: Self-pay

## 2010-09-28 DIAGNOSIS — F909 Attention-deficit hyperactivity disorder, unspecified type: Secondary | ICD-10-CM

## 2010-09-28 MED ORDER — AMPHETAMINE-DEXTROAMPHET ER 10 MG PO CP24: 10.0000 mg | ORAL_CAPSULE | Freq: Every day | ORAL | Status: DC

## 2010-09-28 NOTE — Telephone Encounter (Signed)
Pt informed, Rx in cabinet for pt pick up  

## 2010-09-28 NOTE — Telephone Encounter (Signed)
Done hardcopy to dahlia/LIM B  

## 2010-10-17 ENCOUNTER — Other Ambulatory Visit: Payer: Self-pay

## 2010-10-17 DIAGNOSIS — M545 Low back pain: Secondary | ICD-10-CM

## 2010-10-17 MED ORDER — MORPHINE SULFATE CR 30 MG PO TB12: 30.0000 mg | ORAL_TABLET | Freq: Two times a day (BID) | ORAL | Status: DC

## 2010-10-17 NOTE — Telephone Encounter (Signed)
Pt informed, Rx in cabinet for pt pick up  

## 2010-11-16 ENCOUNTER — Other Ambulatory Visit: Payer: Self-pay

## 2010-11-16 DIAGNOSIS — F909 Attention-deficit hyperactivity disorder, unspecified type: Secondary | ICD-10-CM

## 2010-11-16 MED ORDER — CARISOPRODOL 350 MG PO TABS: 350.0000 mg | ORAL_TABLET | Freq: Two times a day (BID) | ORAL | Status: DC | PRN

## 2010-11-16 MED ORDER — AMPHETAMINE-DEXTROAMPHET ER 10 MG PO CP24: 10.0000 mg | ORAL_CAPSULE | Freq: Every day | ORAL | Status: DC

## 2010-11-16 NOTE — Telephone Encounter (Signed)
Done hardcopy to dahlia/LIM B  

## 2010-11-22 ENCOUNTER — Other Ambulatory Visit: Payer: Self-pay | Admitting: Internal Medicine

## 2010-11-23 ENCOUNTER — Ambulatory Visit (INDEPENDENT_AMBULATORY_CARE_PROVIDER_SITE_OTHER): Payer: Medicare Other | Admitting: Internal Medicine

## 2010-11-23 ENCOUNTER — Encounter: Payer: Self-pay | Admitting: Internal Medicine

## 2010-11-23 VITALS — BP 130/82 | HR 119 | Temp 100.0°F | Ht 73.0 in | Wt 214.2 lb

## 2010-11-23 DIAGNOSIS — B182 Chronic viral hepatitis C: Secondary | ICD-10-CM

## 2010-11-23 DIAGNOSIS — K219 Gastro-esophageal reflux disease without esophagitis: Secondary | ICD-10-CM

## 2010-11-23 DIAGNOSIS — J019 Acute sinusitis, unspecified: Secondary | ICD-10-CM

## 2010-11-23 DIAGNOSIS — G894 Chronic pain syndrome: Secondary | ICD-10-CM

## 2010-11-23 DIAGNOSIS — M545 Low back pain: Secondary | ICD-10-CM

## 2010-11-23 DIAGNOSIS — K7460 Unspecified cirrhosis of liver: Secondary | ICD-10-CM

## 2010-11-23 DIAGNOSIS — F909 Attention-deficit hyperactivity disorder, unspecified type: Secondary | ICD-10-CM

## 2010-11-23 HISTORY — DX: Unspecified cirrhosis of liver: K74.60

## 2010-11-23 HISTORY — DX: Chronic viral hepatitis C: B18.2

## 2010-11-23 MED ORDER — OMEPRAZOLE 20 MG PO CPDR: 20.0000 mg | DELAYED_RELEASE_CAPSULE | Freq: Every day | ORAL | Status: DC

## 2010-11-23 MED ORDER — MORPHINE SULFATE CR 30 MG PO TB12: 30.0000 mg | ORAL_TABLET | Freq: Two times a day (BID) | ORAL | Status: DC

## 2010-11-23 MED ORDER — LACTULOSE 10 GM/15ML PO SOLN: 30.0000 mL | Freq: Every day | ORAL | Status: DC

## 2010-11-23 MED ORDER — DOXYCYCLINE HYCLATE 100 MG PO TABS: 100.0000 mg | ORAL_TABLET | Freq: Two times a day (BID) | ORAL | Status: AC

## 2010-11-23 MED ORDER — HYDROCODONE-APAP-DIETARY PROD 10-325 MG PO MISC: ORAL | Status: DC

## 2010-11-23 MED ORDER — GABAPENTIN 300 MG PO CAPS: 600.0000 mg | ORAL_CAPSULE | Freq: Three times a day (TID) | ORAL | Status: DC

## 2010-11-23 NOTE — Assessment & Plan Note (Signed)
Mild uncontrolled - to re-start PPI,  to f/u any worsening symptoms or concerns

## 2010-11-23 NOTE — Assessment & Plan Note (Signed)
Mild to mod, for antibx course,  to f/u any worsening symptoms or concerns 

## 2010-11-23 NOTE — Assessment & Plan Note (Signed)
stable overall by hx and exam, most recent data reviewed with pt, and pt to continue medical treatment as before  Lab Results  Component Value Date   WBC 7.6 02/21/2010   HGB 13.2 02/21/2010   HCT 38.0* 02/21/2010   PLT 132.0* 02/21/2010   CHOL 148 02/21/2010   TRIG 104.0 02/21/2010   HDL 59.40 02/21/2010   LDLDIRECT 81.5 11/02/2007   ALT 19 02/21/2010   AST 42* 02/21/2010   NA 141 02/21/2010   K 3.5 02/21/2010   CL 107 02/21/2010   CREATININE 0.8 02/21/2010   BUN 10 02/21/2010   CO2 26 02/21/2010   TSH 2.36 02/21/2010   PSA 0.12 02/21/2010   HGBA1C 6.1 01/18/2009

## 2010-11-23 NOTE — Patient Instructions (Addendum)
Take all new medications as prescribed - the antibiotic (sent on the computer to the pharmacy) Continue all other medications as before (the refills you requested are given hardcopy) Please keep your appointments with your specialists as you have planned - [facility] clinic at [facility] Your forms will be filled out as we are able Please return in 2 mo with Lab testing done 3-5 days before

## 2010-11-23 NOTE — Assessment & Plan Note (Signed)
For ms contin refill, also try increse the gabapenitn to 600 tid,  to f/u any worsening symptoms or concerns

## 2010-11-23 NOTE — Progress Notes (Signed)
Subjective:    Patient ID: Kurt Reid, male    DOB: May 14, 1950, 60 y.o.   MRN: [id number]  HPI   Here with 3 days acute onset fever, facial pain, pressure, general weakness and malaise, and greenish d/c, with slight ST, but little to no cough and Pt denies chest pain, increased sob or doe, wheezing, orthopnea, PND, increased LE swelling, palpitations, dizziness or syncope.   Pt denies polydipsia, polyuria, Pt states overall good compliance with meds, trying to follow lower cholesterol, diabetic diet, wt overall stable but little exercise however.  Has appt Monday sept 24 with [facility] for consideration for liver transplant after recent liver bx with stage 4 cirrhosis.  Pt suggests increased Morphine for pain control, but does tolerate the gabapentin. ADHD med working well enough at this point for concentration and task completion.  Here with sister who assists as well.  Denies worsening reflux, dysphagia, abd pain, n/v, bowel change or blood except for the past 2 wks since he ran out of PPI.  Pt continues to have recurring LBP without change in severity, bowel or bladder change, fever, wt loss,  worsening LE pain/numbness/weakness, gait change or falls. Has several forms to fill out for disability - for chronic LBP, bipolar disease, cirrhosis related to Hep C. Past Medical History  Diagnosis Date  . Depression   . Bipolar 1 disorder   . ADHD (attention deficit hyperactivity disorder)   . GERD (gastroesophageal reflux disease)   . Hepatitis C   . Hyperlipidemia   . Low back pain     chronic  . Allergic rhinitis   . Seizure disorder     H/O  . Benign prostatic hypertrophy   . Migraine   . ETOH abuse     hx  . Nephrolithiasis     hx  . Sinusitis     recurrent  . Diverticulosis of colon   . Anxiety   . ADHD 09/23/2006  . ALLERGIC RHINITIS 09/23/2006  . ANXIETY 04/19/2009  . BENIGN PROSTATIC HYPERTROPHY 11/02/2007  . BIPOLAR AFFECTIVE DISORDER 09/23/2006  . Chronic pain  syndrome 01/18/2009  . DEPRESSION 09/23/2006  . DIVERTICULOSIS, COLON 04/19/2009  . ESOPHAGEAL VARICES 04/19/2009  . GERD 09/23/2006  . HEPATITIS C 09/23/2006  . HYPERLIPIDEMIA 09/23/2006  . INSOMNIA-SLEEP DISORDER-UNSPEC 04/19/2009  . LOW BACK PAIN 09/23/2006  . NEPHROLITHIASIS, HX OF 11/02/2007  . SEIZURE DISORDER 09/23/2006  . 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 Back surgury     x 5  . Knee surgury     x 1  . Cheekbone     hx of fx  . Tonsillectomy   . S/p sinus surgury 2010    Dr. Zara Chess    reports that he has been smoking.  He does not have any smokeless tobacco history on file. He reports that he does not drink alcohol or use illicit drugs. family history includes Asthma in his daughter; Dementia in his father; Depression in his mother; Hyperlipidemia in his mother; Hypertension in his mother; and Stroke in his mother. Allergies  Allergen Reactions  . Duloxetine     REACTION: memory dysfunction, dizziness  . Latex   . Levaquin Nausea Only  . Mercury   . Sulfonamide Derivatives    Current Outpatient Prescriptions on File Prior to Visit  Medication Sig Dispense Refill  . amphetamine-dextroamphetamine (ADDERALL XR, 10MG ,) 10 MG 24 hr capsule Take 1 capsule (10 mg  total) by mouth daily.  30 capsule  0  . aspirin (ASPIRIN EC) 81 MG EC tablet Take 81 mg by mouth daily.        . carisoprodol (SOMA) 350 MG tablet Take 1 tablet (350 mg total) by mouth 2 (two) times daily as needed.  60 tablet  2  . fluticasone (FLONASE) 50 MCG/ACT nasal spray 2 sprays by Nasal route daily.  16 g  2  . ondansetron (ZOFRAN) 4 MG tablet Take 4 mg by mouth every 8 (eight) hours as needed. For nausea and vomiting       . venlafaxine (EFFEXOR-XR) 150 MG 24 hr capsule 2 tabs by mouth in the AM  60 capsule  11   Review of Systems Review of Systems  Constitutional: Negative for diaphoresis and unexpected weight change.  HENT: Negative for  drooling and tinnitus.   Eyes: Negative for photophobia and visual disturbance.  Respiratory: Negative for choking and stridor.   Gastrointestinal: Negative for vomiting and blood in stool.  Genitourinary: Negative for hematuria and decreased urine volume.      Objective:   Physical Exam BP 130/82  Pulse 119  Temp(Src) 100 F (37.8 C) (Oral)  Ht 6\' 1"  (1.854 m)  Wt 214 lb 4 oz (97.183 kg)  BMI 28.27 kg/m2  SpO2 96% Physical Exam  VS noted, mild ill appearing Constitutional: Pt appears well-developed and well-nourished.  HENT: Head: Normocephalic.  Right Ear: External ear normal.  Left Ear: External ear normal.  Bilat tm's mild erythema.  Sinus tender bilat .  Pharynx mild erythema Eyes: Conjunctivae and EOM are normal. Pupils are equal, round, and reactive to light.  Neck: Normal range of motion. Neck supple.  Cardiovascular: Normal rate and regular rhythm.   Pulmonary/Chest: Effort normal and breath sounds normal.  Abd:  Soft, NT, non-distended, + BS Neurological: Pt is alert. No cranial nerve deficit.  Skin: Skin is warm. No erythema.  Psychiatric: 1-2+ nervous, not depressed appearing.  Diffuse lumbar tender noted without swelling or rash.       Assessment & Plan:

## 2010-11-28 ENCOUNTER — Telehealth: Payer: Self-pay

## 2010-11-28 NOTE — Telephone Encounter (Signed)
Patient called requesting forms to be completed he left at his last OV. Informed will call back once complete.

## 2010-11-28 NOTE — Telephone Encounter (Signed)
Kurt Reid is not here, no one else is available, so I cannot guarantee they will be done before this weekend, b/c the only time I have is at home on family time

## 2010-11-29 NOTE — Telephone Encounter (Signed)
Called the patient informed of the situation and the patient stated was ok. Informed would call to  [facility] once completed.

## 2010-12-06 ENCOUNTER — Telehealth: Payer: Self-pay

## 2010-12-06 NOTE — Telephone Encounter (Signed)
Called the patient left message informed paperwork is complete and ready for pickup at front desk.

## 2010-12-20 ENCOUNTER — Other Ambulatory Visit: Payer: Self-pay

## 2010-12-20 DIAGNOSIS — M545 Low back pain: Secondary | ICD-10-CM

## 2010-12-20 MED ORDER — MORPHINE SULFATE CR 30 MG PO TB12: 30.0000 mg | ORAL_TABLET | Freq: Two times a day (BID) | ORAL | Status: DC

## 2010-12-20 NOTE — Telephone Encounter (Signed)
Pt informed, Rx in cabinet for pt pick up  

## 2010-12-20 NOTE — Telephone Encounter (Signed)
Done hardcopy to dahlia/LIM B  

## 2010-12-25 ENCOUNTER — Other Ambulatory Visit: Payer: Self-pay

## 2010-12-25 DIAGNOSIS — F909 Attention-deficit hyperactivity disorder, unspecified type: Secondary | ICD-10-CM

## 2010-12-25 MED ORDER — AMPHETAMINE-DEXTROAMPHET ER 10 MG PO CP24: 10.0000 mg | ORAL_CAPSULE | Freq: Every day | ORAL | Status: DC

## 2010-12-25 NOTE — Telephone Encounter (Signed)
Done hardcopy to robin  

## 2010-12-25 NOTE — Telephone Encounter (Signed)
Patient informed that prescription requested is ready for pickup at front desk.

## 2011-01-22 ENCOUNTER — Ambulatory Visit (INDEPENDENT_AMBULATORY_CARE_PROVIDER_SITE_OTHER): Payer: Medicare Other | Admitting: Internal Medicine

## 2011-01-22 ENCOUNTER — Encounter: Payer: Self-pay | Admitting: Internal Medicine

## 2011-01-22 ENCOUNTER — Other Ambulatory Visit (INDEPENDENT_AMBULATORY_CARE_PROVIDER_SITE_OTHER): Payer: Medicare Other

## 2011-01-22 VITALS — BP 102/60 | HR 90 | Temp 98.1°F | Ht 73.0 in | Wt 217.5 lb

## 2011-01-22 DIAGNOSIS — G894 Chronic pain syndrome: Secondary | ICD-10-CM

## 2011-01-22 DIAGNOSIS — M545 Low back pain: Secondary | ICD-10-CM

## 2011-01-22 DIAGNOSIS — F909 Attention-deficit hyperactivity disorder, unspecified type: Secondary | ICD-10-CM

## 2011-01-22 DIAGNOSIS — Z0000 Encounter for general adult medical examination without abnormal findings: Secondary | ICD-10-CM

## 2011-01-22 DIAGNOSIS — K130 Diseases of lips: Secondary | ICD-10-CM | POA: Insufficient documentation

## 2011-01-22 DIAGNOSIS — Z79899 Other long term (current) drug therapy: Secondary | ICD-10-CM

## 2011-01-22 DIAGNOSIS — Z125 Encounter for screening for malignant neoplasm of prostate: Secondary | ICD-10-CM

## 2011-01-22 LAB — BASIC METABOLIC PANEL
BUN: 16 mg/dL (ref 6–23)
CO2: 27 mEq/L (ref 19–32)
Calcium: 8.8 mg/dL (ref 8.4–10.5)
GFR: 84.66 mL/min (ref >60.00)
Glucose, Bld: 189 mg/dL — ABNORMAL HIGH (ref 70–99)

## 2011-01-22 LAB — CBC WITH DIFFERENTIAL/PLATELET
Basophils Absolute: 0.1 10*3/uL (ref 0.0–0.1)
Eosinophils Relative: 4 % (ref 0.0–5.0)
HCT: 36 % — ABNORMAL LOW (ref 39.0–52.0)
Hemoglobin: 11.9 g/dL — ABNORMAL LOW (ref 13.0–17.0)
Lymphocytes Relative: 29.9 % (ref 12.0–46.0)
Lymphs Abs: 3.2 10*3/uL (ref 0.7–4.0)
Monocytes Relative: 11.6 % (ref 3.0–12.0)
Platelets: 180 10*3/uL (ref 150.0–400.0)
RDW: 17.7 % — ABNORMAL HIGH (ref 11.5–14.6)
WBC: 10.7 10*3/uL — ABNORMAL HIGH (ref 4.5–10.5)

## 2011-01-22 LAB — HEPATIC FUNCTION PANEL
Albumin: 3.2 g/dL — ABNORMAL LOW (ref 3.5–5.2)
Total Protein: 7 g/dL (ref 6.0–8.3)

## 2011-01-22 LAB — LIPID PANEL
Cholesterol: 139 mg/dL (ref 0–200)
HDL: 35.6 mg/dL — ABNORMAL LOW (ref >39.00)
Triglycerides: 97 mg/dL (ref 0.0–149.0)
VLDL: 19.4 mg/dL (ref 0.0–40.0)

## 2011-01-22 LAB — TSH: TSH: 2.89 u[IU]/mL (ref 0.35–5.50)

## 2011-01-22 MED ORDER — MORPHINE SULFATE CR 30 MG PO TB12: 30.0000 mg | ORAL_TABLET | Freq: Two times a day (BID) | ORAL | Status: DC

## 2011-01-22 MED ORDER — DOXYCYCLINE HYCLATE 100 MG PO TABS: 100.0000 mg | ORAL_TABLET | Freq: Two times a day (BID) | ORAL | Status: AC

## 2011-01-22 MED ORDER — AMPHETAMINE-DEXTROAMPHET ER 10 MG PO CP24: 10.0000 mg | ORAL_CAPSULE | Freq: Every day | ORAL | Status: DC

## 2011-01-22 NOTE — Patient Instructions (Addendum)
Take all new medications as prescribed Continue all other medications as before You are given the refills as requested today You will be contacted regarding the referral for: ENT for the lip abscess -  To See PCC's today before leaving Please go to LAB in the Basement for the blood and/or urine tests to be done today Please call the phone number [phone] (the [employer] System) for results of testing in 2-3 days;  When calling, simply dial the number, and when prompted enter the MRN number above (the Medical Record Number) and the # key, then the message should start. Please return in 6 months, or sooner if needed

## 2011-01-23 ENCOUNTER — Encounter: Payer: Self-pay | Admitting: Internal Medicine

## 2011-01-23 NOTE — Progress Notes (Signed)
Subjective:    Patient ID: Kurt Reid, male    DOB: 12-Apr-1950, 60 y.o.   MRN: [id number]  HPI  Here for wellness and f/u;  Overall doing ok;  Pt denies CP, worsening SOB, DOE, wheezing, orthopnea, PND, worsening LE edema, palpitations, dizziness or syncope.  Pt denies neurological change such as new Headache, facial or extremity weakness.  Pt denies polydipsia, polyuria, or low sugar symptoms. Pt states overall good compliance with treatment and medications, good tolerability, and trying to follow lower cholesterol diet.  Pt denies worsening depressive symptoms, suicidal ideation or panic. No fever, wt loss, night sweats, loss of appetite, or other constitutional symptoms.  Pt states good ability with ADL's, low fall risk, home safety reviewed and adequate, no significant changes in hearing or vision, and occasionally active with exercise. Incidentally with lower lip 2-3 days red, tender, swelling and mild drainage, improved from yesterday.  Has hx of MRSA.  Needs mult med refills.  Pain overall stable. Has not been able to afford f/u with pain clinic. Marland KitchenSister present for exam today Past Medical History  Diagnosis Date  . Depression   . Bipolar 1 disorder   . ADHD (attention deficit hyperactivity disorder)   . GERD (gastroesophageal reflux disease)   . Hepatitis C   . Hyperlipidemia   . Low back pain     chronic  . Allergic rhinitis   . Seizure disorder     H/O  . Benign prostatic hypertrophy   . Migraine   . ETOH abuse     hx  . Nephrolithiasis     hx  . Sinusitis     recurrent  . Diverticulosis of colon   . Anxiety   . ADHD 09/23/2006  . ALLERGIC RHINITIS 09/23/2006  . ANXIETY 04/19/2009  . BENIGN PROSTATIC HYPERTROPHY 11/02/2007  . BIPOLAR AFFECTIVE DISORDER 09/23/2006  . Chronic pain syndrome 01/18/2009  . DEPRESSION 09/23/2006  . DIVERTICULOSIS, COLON 04/19/2009  . ESOPHAGEAL VARICES 04/19/2009  . GERD 09/23/2006  . HEPATITIS C 09/23/2006  . HYPERLIPIDEMIA 09/23/2006  .  INSOMNIA-SLEEP DISORDER-UNSPEC 04/19/2009  . LOW BACK PAIN 09/23/2006  . NEPHROLITHIASIS, HX OF 11/02/2007  . SEIZURE DISORDER 09/23/2006  . 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 Back surgury     x 5  . Knee surgury     x 1  . Cheekbone     hx of fx  . Tonsillectomy   . S/p sinus surgury 2010    Dr. Zara Chess    reports that he has been smoking.  He does not have any smokeless tobacco history on file. He reports that he does not drink alcohol or use illicit drugs. family history includes Asthma in his daughter; Dementia in his father; Depression in his mother; Hyperlipidemia in his mother; Hypertension in his mother; and Stroke in his mother. Allergies  Allergen Reactions  . Duloxetine     REACTION: memory dysfunction, dizziness  . Latex   . Levaquin Nausea Only  . Mercury   . Sulfonamide Derivatives    Current Outpatient Prescriptions on File Prior to Visit  Medication Sig Dispense Refill  . aspirin (ASPIRIN EC) 81 MG EC tablet Take 81 mg by mouth daily.        . carisoprodol (SOMA) 350 MG tablet Take 1 tablet (350 mg total) by mouth 2 (two) times daily as needed.  60 tablet  2  . fluticasone (FLONASE) 50 MCG/ACT nasal  spray 2 sprays by Nasal route daily.  16 g  2  . gabapentin (NEURONTIN) 300 MG capsule Take 2 capsules (600 mg total) by mouth 3 (three) times daily.  180 capsule  5  . Hydrocodone-APAP-Dietary Prod (HYDROCODONE-APAP-NUTRIT SUPP) 10-325 MG MISC 1 tab by mouth every 6 hrs as needed for pain  120 each  2  . lactulose (CHRONULAC) 10 GM/15ML solution Take 30 mLs (20 g total) by mouth daily.  1892 mL  1  . omeprazole (PRILOSEC) 20 MG capsule Take 1 capsule (20 mg total) by mouth daily.  90 capsule  3  . ondansetron (ZOFRAN) 4 MG tablet Take 4 mg by mouth every 8 (eight) hours as needed. For nausea and vomiting       . venlafaxine (EFFEXOR-XR) 150 MG 24 hr capsule 2 tabs by mouth in the AM  60 capsule  11   Review  of Systems Review of Systems  Constitutional: Negative for diaphoresis, activity change, appetite change and unexpected weight change.  HENT: Negative for hearing loss, ear pain, facial swelling, mouth sores and neck stiffness.   Eyes: Negative for pain, redness and visual disturbance.  Respiratory: Negative for shortness of breath and wheezing.   Cardiovascular: Negative for chest pain and palpitations.  Gastrointestinal: Negative for diarrhea, blood in stool, abdominal distention and rectal pain.  Genitourinary: Negative for hematuria, flank pain and decreased urine volume.  Musculoskeletal: Negative for myalgias and joint swelling.  Skin: Negative for color change and wound.  Neurological: Negative for syncope and numbness.  Hematological: Negative for adenopathy.  Psychiatric/Behavioral: Negative for hallucinations, self-injury, decreased concentration and agitation.      Objective:   Physical Exam BP 102/60  Pulse 90  Temp(Src) 98.1 F (36.7 C) (Oral)  Ht 6\' 1"  (1.854 m)  Wt 217 lb 8 oz (98.657 kg)  BMI 28.70 kg/m2  SpO2 93% Physical Exam  VS noted Constitutional: Pt is oriented to person, place, and time. Appears well-developed and well-nourished.  HENT:  Head: Normocephalic and atraumatic.  Right Ear: External ear normal.  Left Ear: External ear normal.  Nose: Nose normal.  Mouth/Throat: Oropharynx is clear and moist Lower lip with diffuse red, tender, swelling and mild drainage pustular.  Eyes: Conjunctivae and EOM are normal. Pupils are equal, round, and reactive to light.  Neck: Normal range of motion. Neck supple. No JVD present. No tracheal deviation present.  Cardiovascular: Normal rate, regular rhythm, normal heart sounds and intact distal pulses.   Pulmonary/Chest: Effort normal and breath sounds normal.  Abdominal: Soft. Bowel sounds are normal. There is no tenderness.  Musculoskeletal: Normal range of motion. Exhibits no edema.  Lymphadenopathy:  Has no  cervical adenopathy.  Neurological: Pt is alert and oriented to person, place, and time. Pt has normal reflexes. No cranial nerve deficit.  Skin: Skin is warm and dry. No rash noted.  Psychiatric:  1-2+ nervous. Behavior is normal.     Assessment & Plan:

## 2011-01-23 NOTE — Assessment & Plan Note (Addendum)
Overall doing well, age appropriate education and counseling updated, referrals for preventative services and immunizations addressed, dietary and smoking counseling addressed, most recent labs and ECG reviewed.  I have personally reviewed and have noted: 1) the patient's medical and social history 2) The pt's use of alcohol, tobacco, and illicit drugs 3) The patient's current medications and supplements 4) Functional ability including ADL's, fall risk, home safety risk, hearing and visual impairment 5) Diet and physical activities 6) Evidence for depression or mood disorder 7) The patient's height, weight, and BMI have been recorded in the chart I have made referrals, and provided counseling and education based on review of the above Decliens immuinzations or colonoscopy

## 2011-01-23 NOTE — Assessment & Plan Note (Signed)
stable overall by hx and exam, , and pt to continue medical treatment as before   

## 2011-01-23 NOTE — Assessment & Plan Note (Signed)
Mild to mod, for antibx course,  to f/u any worsening symptoms or concerns but will need refer to ENT asap

## 2011-01-28 LAB — URINALYSIS, ROUTINE W REFLEX MICROSCOPIC
Leukocytes, UA: NEGATIVE
Nitrite: NEGATIVE
Specific Gravity, Urine: >=1.03 (ref 1.000–1.030)
Urobilinogen, UA: 0.2 (ref 0.0–1.0)
pH: 6 (ref 5.0–8.0)

## 2011-01-30 ENCOUNTER — Telehealth: Payer: Self-pay

## 2011-01-30 NOTE — Telephone Encounter (Signed)
Put order in for hgba1c for this patient and called the patient to inform the need to return to the lab.

## 2011-02-04 ENCOUNTER — Other Ambulatory Visit: Payer: Self-pay | Admitting: Internal Medicine

## 2011-02-18 ENCOUNTER — Other Ambulatory Visit: Payer: Self-pay

## 2011-02-18 MED ORDER — CARISOPRODOL 350 MG PO TABS: 350.0000 mg | ORAL_TABLET | Freq: Two times a day (BID) | ORAL | Status: DC | PRN

## 2011-02-18 MED ORDER — HYDROCODONE-APAP-DIETARY PROD 10-325 MG PO MISC: ORAL | Status: DC

## 2011-02-18 NOTE — Telephone Encounter (Signed)
Done hardcopy to robin  

## 2011-02-18 NOTE — Telephone Encounter (Signed)
Faxed hardcopy to [facility] [place]  

## 2011-02-22 ENCOUNTER — Ambulatory Visit (INDEPENDENT_AMBULATORY_CARE_PROVIDER_SITE_OTHER): Payer: Medicare Other | Admitting: Internal Medicine

## 2011-02-22 VITALS — BP 110/76 | HR 84 | Temp 98.5°F | Wt 216.0 lb

## 2011-02-22 DIAGNOSIS — F3289 Other specified depressive episodes: Secondary | ICD-10-CM

## 2011-02-22 DIAGNOSIS — IMO0001 Reserved for inherently not codable concepts without codable children: Secondary | ICD-10-CM

## 2011-02-22 DIAGNOSIS — E785 Hyperlipidemia, unspecified: Secondary | ICD-10-CM

## 2011-02-22 DIAGNOSIS — F329 Major depressive disorder, single episode, unspecified: Secondary | ICD-10-CM

## 2011-02-22 MED ORDER — METFORMIN HCL ER 500 MG PO TB24: 1000.0000 mg | ORAL_TABLET | Freq: Every day | ORAL | Status: DC

## 2011-02-22 MED ORDER — PIOGLITAZONE HCL 30 MG PO TABS: 30.0000 mg | ORAL_TABLET | Freq: Every day | ORAL | Status: DC

## 2011-02-22 MED ORDER — GLUCOSE BLOOD VI STRP: ORAL_STRIP | Status: DC

## 2011-02-22 MED ORDER — ONETOUCH ULTRASOFT LANCETS MISC: Status: DC

## 2011-02-22 NOTE — Patient Instructions (Signed)
Take all new medications as prescribed Start the metformin at one pill per day for 3 days, then 2 per day after that to avoid stomach upset OK to hold on the glucotrol  You will be contacted regarding the referral for: diabetes education You are given the glucometer today, and the prescription for the supplies were sent in to the pharmacy Please check your sugar once daily, but at different time (before a meal or before bed) and write them down Please return in 6 weeks with Lab testing done 3-5 days before

## 2011-02-23 ENCOUNTER — Encounter: Payer: Self-pay | Admitting: Internal Medicine

## 2011-02-23 NOTE — Assessment & Plan Note (Signed)
Goal ldl < 70 - stable overall by hx and exam, most recent data reviewed with pt, and pt to continue medical treatment as before, should improve with DM tx,  Lab Results  Component Value Date   LDLCALC 84 01/22/2011

## 2011-02-23 NOTE — Assessment & Plan Note (Signed)
stable overall by hx and exam, most recent data reviewed with pt, and pt to continue medical treatment as before  Lab Results  Component Value Date   WBC 10.7* 01/22/2011   HGB 11.9* 01/22/2011   HCT 36.0* 01/22/2011   PLT 180.0 01/22/2011   GLUCOSE 189* 01/22/2011   CHOL 139 01/22/2011   TRIG 97.0 01/22/2011   HDL 35.60* 01/22/2011   LDLDIRECT 81.5 11/02/2007   LDLCALC 84 01/22/2011   ALT 19 01/22/2011   AST 27 01/22/2011   NA 138 01/22/2011   K 4.8 01/22/2011   CL 104 01/22/2011   CREATININE 1.0 01/22/2011   BUN 16 01/22/2011   CO2 27 01/22/2011   TSH 2.89 01/22/2011   PSA 0.05* 01/22/2011   HGBA1C 6.1 01/18/2009

## 2011-02-23 NOTE — Progress Notes (Signed)
Subjective:    Patient ID: Kurt Reid, male    DOB: [birth year], 60 y.o.   MRN: [id number]  HPI  Here after recent finding elev BS, initially postprandial 1 wk ago at 416, with f/u BS 285, f/u a1c 11.0 , rx'd glipizide 10 ER qd but not yet started as he wanted to f/u here, and is here with wife. overall doing ok,  Pt denies chest pain, increased sob or doe, wheezing, orthopnea, PND, increased LE swelling, palpitations, dizziness or syncope.  Pt denies new neurological symptoms such as new headache, or facial or extremity weakness or numbness   Pt denies polydipsia, polyuria   Pt states overall good compliance with meds, trying to follow lower cholesterol, diabetic diet, wt overall stable but little exercise however.   Pt denies fever, wt loss, night sweats, loss of appetite, or other constitutional symptoms. Denies worsening depressive symptoms, suicidal ideation, or panic, though has ongoing anxiety. Past Medical History  Diagnosis Date  . Depression   . Bipolar 1 disorder   . ADHD (attention deficit hyperactivity disorder)   . GERD (gastroesophageal reflux disease)   . Hepatitis C   . Hyperlipidemia   . Low back pain     chronic  . Allergic rhinitis   . Seizure disorder     H/O  . Benign prostatic hypertrophy   . Migraine   . ETOH abuse     hx  . Nephrolithiasis     hx  . Sinusitis     recurrent  . Diverticulosis of colon   . Anxiety   . ADHD 09/23/2006  . ALLERGIC RHINITIS 09/23/2006  . ANXIETY 04/19/2009  . BENIGN PROSTATIC HYPERTROPHY 11/02/2007  . BIPOLAR AFFECTIVE DISORDER 09/23/2006  . Chronic pain syndrome 01/18/2009  . DEPRESSION 09/23/2006  . DIVERTICULOSIS, COLON 04/19/2009  . ESOPHAGEAL VARICES 04/19/2009  . GERD 09/23/2006  . HEPATITIS C 09/23/2006  . HYPERLIPIDEMIA 09/23/2006  . INSOMNIA-SLEEP DISORDER-UNSPEC 04/19/2009  . LOW BACK PAIN 09/23/2006  . NEPHROLITHIASIS, HX OF 11/02/2007  . SEIZURE DISORDER 09/23/2006  . 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 Back surgury     x 5  . Knee surgury     x 1  . Cheekbone     hx of fx  . Tonsillectomy   . S/p sinus surgury 2010    Dr. Zara Chess    reports that he has been smoking.  He does not have any smokeless tobacco history on file. He reports that he does not drink alcohol or use illicit drugs. family history includes Asthma in his daughter; Dementia in his father; Depression in his mother; Hyperlipidemia in his mother; Hypertension in his mother; and Stroke in his mother. Allergies  Allergen Reactions  . Duloxetine     REACTION: memory dysfunction, dizziness  . Latex   . Levaquin Nausea Only  . Mercury   . Sulfonamide Derivatives    Current Outpatient Prescriptions on File Prior to Visit  Medication Sig Dispense Refill  . amphetamine-dextroamphetamine (ADDERALL XR, 10MG ,) 10 MG 24 hr capsule Take 1 capsule (10 mg total) by mouth daily. To fill Mar 23, 2011  30 capsule  0  . aspirin (ASPIRIN EC) 81 MG EC tablet Take 81 mg by mouth daily.        . carisoprodol (SOMA) 350 MG tablet Take 1 tablet (350 mg total) by mouth 2 (two) times daily as needed.  60 tablet  2  .  fluticasone (FLONASE) 50 MCG/ACT nasal spray 2 sprays by Nasal route daily.  16 g  2  . gabapentin (NEURONTIN) 300 MG capsule Take 2 capsules (600 mg total) by mouth 3 (three) times daily.  180 capsule  5  . Hydrocodone-APAP-Dietary Prod (HYDROCODONE-APAP-NUTRIT SUPP) 10-325 MG MISC 1 tab by mouth every 6 hrs as needed for pain  120 each  2  . lactulose (CHRONULAC) 10 GM/15ML solution Take 30 mLs (20 g total) by mouth daily.  1892 mL  1  . morphine (MS CONTIN) 30 MG 12 hr tablet Take 1 tablet (30 mg total) by mouth every 12 (twelve) hours. To fill Mar 23, 2011  60 tablet  0  . omeprazole (PRILOSEC) 20 MG capsule Take 1 capsule (20 mg total) by mouth daily.  90 capsule  3  . ondansetron (ZOFRAN) 4 MG tablet Take 4 mg by mouth every 8 (eight) hours as needed. For nausea and vomiting        . venlafaxine (EFFEXOR-XR) 150 MG 24 hr capsule 2 tabs by mouth in the AM  60 capsule  11   Review of Systems Review of Systems  Constitutional: Negative for diaphoresis and unexpected weight change.  HENT: Negative for drooling and tinnitus.   Eyes: Negative for photophobia and visual disturbance.  Respiratory: Negative for choking and stridor.   Gastrointestinal: Negative for vomiting and blood in stool.  Genitourinary: Negative for hematuria and decreased urine volume.    Objective:   Physical Exam BP 110/76  Pulse 84  Temp(Src) 98.5 F (36.9 C) (Oral)  Wt 216 lb (97.977 kg)  SpO2 93% Physical Exam  VS noted, not ill appearing Constitutional: Pt appears well-developed and well-nourished.  HENT: Head: Normocephalic.  Right Ear: External ear normal.  Left Ear: External ear normal.  Eyes: Conjunctivae and EOM are normal. Pupils are equal, round, and reactive to light.  Neck: Normal range of motion. Neck supple.  Cardiovascular: Normal rate and regular rhythm.   Pulmonary/Chest: Effort normal and breath sounds normal.  Abd:  Soft, NT, non-distended, + BS Neurological: Pt is alert. No cranial nerve deficit.  Skin: Skin is warm. No erythema.  Psychiatric: Pt behavior is normal. Thought content normal. not depressed affect, 1+ nervous    Assessment & Plan:

## 2011-02-23 NOTE — Assessment & Plan Note (Signed)
Recent onset with a1c 11.0 - to start dual med tx (glucophage/actos) as will likely need at least 2 meds, hold on glipizide for now due to higher risk with sulfonyureas, refer DM education, gave glucometer and supplies today, f/u labs next visit

## 2011-02-28 ENCOUNTER — Telehealth: Payer: Self-pay

## 2011-02-28 MED ORDER — GLIPIZIDE ER 10 MG PO TB24: 10.0000 mg | ORAL_TABLET | Freq: Every day | ORAL | Status: DC

## 2011-02-28 MED ORDER — ONETOUCH DELICA LANCETS MISC: 1.0000 | Freq: Two times a day (BID) | Status: DC

## 2011-02-28 NOTE — Telephone Encounter (Signed)
Pt called stating that Actos was too expensive - $200+. Pt is requesting an alternative medication.

## 2011-02-28 NOTE — Telephone Encounter (Signed)
If that is the case, and he is already taking full dose metformin, we'll have to go to "plan B"  Ok to take the glucotrol XL 10 qd  Please CONTINUE the metformin (no need to change anything else)

## 2011-02-28 NOTE — Telephone Encounter (Signed)
Called the patient left message to call back 

## 2011-03-01 ENCOUNTER — Telehealth: Payer: Self-pay

## 2011-03-01 NOTE — Telephone Encounter (Signed)
Patient wife called c/o elevated blood sugar ranging from 579 (last night) and 530 (this am). She is requesting advisement from MD. Thanks

## 2011-03-01 NOTE — Telephone Encounter (Signed)
Ok to take "extra" metformin and glucotrol XL 10 mg now, cont to monitor blood sugars, and call with results on Monday dec 31  Pt should to UC or ER if feeling dizzy, fever, falls, or if sugars dont come down

## 2011-03-01 NOTE — Telephone Encounter (Signed)
Pt's wife informed of MD's advisement.  

## 2011-03-04 NOTE — Telephone Encounter (Signed)
Called the patient left message to call back 

## 2011-03-06 NOTE — Telephone Encounter (Signed)
Have called the patient for several days and unable to contact. Sent the patient a letter to call our office for medication instructions.

## 2011-03-06 NOTE — Telephone Encounter (Signed)
Called the patient left message to call back 

## 2011-03-11 ENCOUNTER — Telehealth: Payer: Self-pay

## 2011-03-11 MED ORDER — METFORMIN HCL ER 500 MG PO TB24: ORAL_TABLET | ORAL | Status: DC

## 2011-03-11 NOTE — Telephone Encounter (Signed)
The patients wife Almyra Free) called back to inform the patient did get message from 02/28/11 and is taking meds as instructed by JWJ. His BS has dropped from average of 500 to 280. Also the lancets sent in are not working, the patient has to stick too many times to get any blood , please advise

## 2011-03-11 NOTE — Telephone Encounter (Signed)
Called the patients wife left message to call back. 

## 2011-03-11 NOTE — Telephone Encounter (Signed)
Patient's wife informed

## 2011-03-11 NOTE — Telephone Encounter (Signed)
Called the patients wife back left message to call back

## 2011-03-11 NOTE — Telephone Encounter (Signed)
Ok to increase the metformin to 3 pills in the AM (done per emr), and cont all other meds (including the glucotrol XL 10 qd )  Not sure what to say about the lancets, except maybe check with pharmacist;  If there is a different brand that would work better to let us know, and we can prob do that

## 2011-03-19 ENCOUNTER — Other Ambulatory Visit: Payer: Self-pay | Admitting: Internal Medicine

## 2011-04-02 ENCOUNTER — Other Ambulatory Visit (INDEPENDENT_AMBULATORY_CARE_PROVIDER_SITE_OTHER): Payer: Medicare Other

## 2011-04-02 LAB — BASIC METABOLIC PANEL
BUN: 11 mg/dL (ref 6–23)
Creatinine, Ser: 0.7 mg/dL (ref 0.4–1.5)
GFR: 114.24 mL/min (ref >60.00)
Potassium: 4.1 mEq/L (ref 3.5–5.1)

## 2011-04-02 LAB — LIPID PANEL
Cholesterol: 146 mg/dL (ref 0–200)
LDL Cholesterol: 80 mg/dL (ref 0–99)
Total CHOL/HDL Ratio: 3
VLDL: 16.8 mg/dL (ref 0.0–40.0)

## 2011-04-05 ENCOUNTER — Ambulatory Visit (INDEPENDENT_AMBULATORY_CARE_PROVIDER_SITE_OTHER): Payer: Medicare Other | Admitting: Internal Medicine

## 2011-04-05 ENCOUNTER — Encounter: Payer: Self-pay | Admitting: Internal Medicine

## 2011-04-05 VITALS — BP 122/72 | HR 75 | Temp 98.7°F | Ht 73.0 in | Wt 222.2 lb

## 2011-04-05 DIAGNOSIS — J019 Acute sinusitis, unspecified: Secondary | ICD-10-CM

## 2011-04-05 DIAGNOSIS — M545 Low back pain: Secondary | ICD-10-CM

## 2011-04-05 DIAGNOSIS — F909 Attention-deficit hyperactivity disorder, unspecified type: Secondary | ICD-10-CM

## 2011-04-05 DIAGNOSIS — E785 Hyperlipidemia, unspecified: Secondary | ICD-10-CM

## 2011-04-05 MED ORDER — MORPHINE SULFATE CR 30 MG PO TB12: 30.0000 mg | ORAL_TABLET | Freq: Two times a day (BID) | ORAL | Status: DC

## 2011-04-05 MED ORDER — ONETOUCH SURESOFT LANCING DEV MISC: 1.0000 "application " | Freq: Two times a day (BID) | Status: DC

## 2011-04-05 MED ORDER — HYDROCODONE-APAP-DIETARY PROD 10-325 MG PO MISC: ORAL | Status: DC

## 2011-04-05 MED ORDER — METFORMIN HCL ER 500 MG PO TB24: ORAL_TABLET | ORAL | Status: DC

## 2011-04-05 MED ORDER — AMPHETAMINE-DEXTROAMPHET ER 10 MG PO CP24: 10.0000 mg | ORAL_CAPSULE | Freq: Every day | ORAL | Status: DC

## 2011-04-05 MED ORDER — CEPHALEXIN 500 MG PO CAPS: 500.0000 mg | ORAL_CAPSULE | Freq: Four times a day (QID) | ORAL | Status: AC

## 2011-04-05 MED ORDER — GLIPIZIDE ER 10 MG PO TB24: 10.0000 mg | ORAL_TABLET | Freq: Two times a day (BID) | ORAL | Status: DC

## 2011-04-05 NOTE — Patient Instructions (Addendum)
Take all new medications as prescribed - the antibiotic Increase the metformin to 4 pills in the AM Increase the glipizide to 1 pill twice per day Continue all other medications as before Please follow stricter Diabetic diet, and attend the Diabetes class, and try to lose weight You are given the pain medication and adderall refills today Please keep your appointments with your specialists as you have planned - GI (Dr Braulio Conte, as well as the [facility]) Please return in 3 mo with Lab testing done 3-5 days before

## 2011-04-06 ENCOUNTER — Encounter: Payer: Self-pay | Admitting: Internal Medicine

## 2011-04-06 DIAGNOSIS — J019 Acute sinusitis, unspecified: Secondary | ICD-10-CM | POA: Insufficient documentation

## 2011-04-06 DIAGNOSIS — F909 Attention-deficit hyperactivity disorder, unspecified type: Secondary | ICD-10-CM | POA: Insufficient documentation

## 2011-04-06 NOTE — Assessment & Plan Note (Signed)
stable overall by hx and exam, and pt to continue medical treatment as before, for med refill today 

## 2011-04-06 NOTE — Assessment & Plan Note (Signed)
Mild to mod, for antibx course,  to f/u any worsening symptoms or concerns 

## 2011-04-06 NOTE — Assessment & Plan Note (Addendum)
Uncontrolled due to diet noncompliacne, for incr metformin and glpizide, for better diet, to start DM education soon,  to f/u next visit, for lab today

## 2011-04-06 NOTE — Assessment & Plan Note (Signed)
stable overall by hx and exam, most recent data reviewed with pt, and pt to continue medical treatment as before  Lab Results  Component Value Date   LDLCALC 80 04/02/2011

## 2011-04-06 NOTE — Progress Notes (Signed)
Subjective:    Patient ID: Kurt Reid, male    DOB: 06-09-1950, 61 y.o.   MRN: [id number]  HPI  Here to f/u with sister, Here to f/u; overall doing ok,  Pt denies chest pain, increased sob or doe, wheezing, orthopnea, PND, increased LE swelling, palpitations, dizziness or syncope.  Pt denies new neurological symptoms such as new headache, or facial or extremity weakness or numbness   Pt denies polydipsia, polyuria, or low sugar symptoms such as weakness or confusion improved with po intake.  Pt states overall good compliance with meds, admits to poor diet since last visit for unclear reasons, not really trying to follow lower cholesterol, diabetic diet, wt overall stable and little exercise.   Pt denies fever, wt loss, night sweats, loss of appetite, or other constitutional symptoms  Denies worsening depressive symptoms, suicidal ideation, or panic.  Pt continues to have recurring LBP without change in severity, bowel or bladder change, fever, wt loss,  worsening LE pain/numbness/weakness, gait change or falls.  ADD symptoms stable it seems, needs med refills.  Here with 3 days acute onset fever, facial pain, pressure, general weakness and malaise, and greenish d/c, with slight ST.    Past Medical History  Diagnosis Date  . Depression   . Bipolar 1 disorder   . ADHD (attention deficit hyperactivity disorder)   . GERD (gastroesophageal reflux disease)   . Hepatitis C   . Hyperlipidemia   . Low back pain     chronic  . Allergic rhinitis   . Seizure disorder     H/O  . Benign prostatic hypertrophy   . Migraine   . ETOH abuse     hx  . Nephrolithiasis     hx  . Sinusitis     recurrent  . Diverticulosis of colon   . Anxiety   . ADHD 09/23/2006  . ALLERGIC RHINITIS 09/23/2006  . ANXIETY 04/19/2009  . BENIGN PROSTATIC HYPERTROPHY 11/02/2007  . BIPOLAR AFFECTIVE DISORDER 09/23/2006  . Chronic pain syndrome 01/18/2009  . DEPRESSION 09/23/2006  . DIVERTICULOSIS, COLON 04/19/2009  .  ESOPHAGEAL VARICES 04/19/2009  . GERD 09/23/2006  . HEPATITIS C 09/23/2006  . HYPERLIPIDEMIA 09/23/2006  . INSOMNIA-SLEEP DISORDER-UNSPEC 04/19/2009  . LOW BACK PAIN 09/23/2006  . NEPHROLITHIASIS, HX OF 11/02/2007  . SEIZURE DISORDER 09/23/2006  . 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 Back surgury     x 5  . Knee surgury     x 1  . Cheekbone     hx of fx  . Tonsillectomy   . S/p sinus surgury 2010    Dr. Zara Chess    reports that he has been smoking.  He does not have any smokeless tobacco history on file. He reports that he does not drink alcohol or use illicit drugs. family history includes Asthma in his daughter; Dementia in his father; Depression in his mother; Hyperlipidemia in his mother; Hypertension in his mother; and Stroke in his mother. Allergies  Allergen Reactions  . Duloxetine     REACTION: memory dysfunction, dizziness  . Latex   . Levaquin Nausea Only  . Mercury   . Sulfonamide Derivatives    Current Outpatient Prescriptions on File Prior to Visit  Medication Sig Dispense Refill  . aspirin (ASPIRIN EC) 81 MG EC tablet Take 81 mg by mouth daily.        . carisoprodol (SOMA) 350 MG tablet Take 1 tablet (  350 mg total) by mouth 2 (two) times daily as needed.  60 tablet  2  . fluticasone (FLONASE) 50 MCG/ACT nasal spray 2 sprays by Nasal route daily.  16 g  2  . gabapentin (NEURONTIN) 300 MG capsule Take 2 capsules (600 mg total) by mouth 3 (three) times daily.  180 capsule  5  . gabapentin (NEURONTIN) 300 MG capsule TAKE ONE CAPSULE BY MOUTH 4 TIMES A DAY  120 capsule  5  . glucose blood (ONE TOUCH ULTRA TEST) test strip Use as instructed  100 each  12  . lactulose (CHRONULAC) 10 GM/15ML solution Take 30 mLs (20 g total) by mouth daily.  1892 mL  1  . omeprazole (PRILOSEC) 20 MG capsule Take 1 capsule (20 mg total) by mouth daily.  90 capsule  3  . ondansetron (ZOFRAN) 4 MG tablet Take 4 mg by mouth every 8  (eight) hours as needed. For nausea and vomiting       . venlafaxine (EFFEXOR-XR) 150 MG 24 hr capsule 2 tabs by mouth in the AM  60 capsule  11   Review of Systems Review of Systems  Constitutional: Negative for diaphoresis and unexpected weight change.  HENT: Negative for drooling and tinnitus.   Eyes: Negative for photophobia and visual disturbance.  Respiratory: Negative for choking and stridor.   Gastrointestinal: Negative for vomiting and blood in stool.  Genitourinary: Negative for hematuria and decreased urine volume.        Objective:   Physical Exam BP 122/72  Pulse 75  Temp(Src) 98.7 F (37.1 C) (Oral)  Ht 6\' 1"  (1.854 m)  Wt 222 lb 4 oz (100.812 kg)  BMI 29.32 kg/m2  SpO2 97% Physical Exam  VS noted Constitutional: Pt appears well-developed and well-nourished.  HENT: Head: Normocephalic.  Right Ear: External ear normal.  Left Ear: External ear normal.  Bilat tm's mild erythema.  Sinus tender.  Pharynx mild erythema Eyes: Conjunctivae and EOM are normal. Pupils are equal, round, and reactive to light.  Neck: Normal range of motion. Neck supple.  Cardiovascular: Normal rate and regular rhythm.   Pulmonary/Chest: Effort normal and breath sounds normal.  Abd:  Soft, NT, non-distended, + BS Neurological: Pt is alert. No cranial nerve deficit.  Skin: Skin is warm. No erythema.  Psychiatric: Pt behavior is normal.1+ nervous   Assessment & Plan:

## 2011-04-26 ENCOUNTER — Ambulatory Visit: Payer: Medicare Other | Admitting: Internal Medicine

## 2011-05-21 ENCOUNTER — Other Ambulatory Visit: Payer: Self-pay

## 2011-05-21 MED ORDER — CARISOPRODOL 350 MG PO TABS: 350.0000 mg | ORAL_TABLET | Freq: Two times a day (BID) | ORAL | Status: DC | PRN

## 2011-05-21 NOTE — Telephone Encounter (Signed)
Done hardcopy to robin  

## 2011-05-22 NOTE — Telephone Encounter (Signed)
Faxed hardcopy to pharmacy. 

## 2011-05-27 ENCOUNTER — Other Ambulatory Visit: Payer: Self-pay

## 2011-05-27 MED ORDER — VENLAFAXINE HCL ER 150 MG PO CP24: ORAL_CAPSULE | ORAL | Status: DC

## 2011-06-12 ENCOUNTER — Other Ambulatory Visit: Payer: Self-pay

## 2011-06-12 MED ORDER — HYDROCODONE-APAP-DIETARY PROD 10-325 MG PO MISC: ORAL | Status: DC

## 2011-06-12 NOTE — Telephone Encounter (Signed)
Faxed hardcopy to pharmacy. 

## 2011-06-12 NOTE — Telephone Encounter (Signed)
Done hardcopy to robin  

## 2011-07-03 ENCOUNTER — Encounter: Payer: Self-pay | Admitting: Internal Medicine

## 2011-07-03 ENCOUNTER — Ambulatory Visit (INDEPENDENT_AMBULATORY_CARE_PROVIDER_SITE_OTHER): Payer: Medicare Other | Admitting: Internal Medicine

## 2011-07-03 ENCOUNTER — Other Ambulatory Visit (INDEPENDENT_AMBULATORY_CARE_PROVIDER_SITE_OTHER): Payer: Medicare Other

## 2011-07-03 VITALS — BP 120/68 | HR 95 | Temp 99.9°F | Ht 73.0 in | Wt 222.5 lb

## 2011-07-03 DIAGNOSIS — G894 Chronic pain syndrome: Secondary | ICD-10-CM

## 2011-07-03 DIAGNOSIS — F909 Attention-deficit hyperactivity disorder, unspecified type: Secondary | ICD-10-CM

## 2011-07-03 DIAGNOSIS — M545 Low back pain, unspecified: Secondary | ICD-10-CM

## 2011-07-03 DIAGNOSIS — K7460 Unspecified cirrhosis of liver: Secondary | ICD-10-CM

## 2011-07-03 DIAGNOSIS — IMO0001 Reserved for inherently not codable concepts without codable children: Secondary | ICD-10-CM

## 2011-07-03 DIAGNOSIS — B182 Chronic viral hepatitis C: Secondary | ICD-10-CM

## 2011-07-03 LAB — BASIC METABOLIC PANEL
BUN: 8 mg/dL (ref 6–23)
CO2: 25 mEq/L (ref 19–32)
Chloride: 110 mEq/L (ref 96–112)
Creatinine, Ser: 0.7 mg/dL (ref 0.4–1.5)
Glucose, Bld: 87 mg/dL (ref 70–99)

## 2011-07-03 LAB — LIPID PANEL
Cholesterol: 140 mg/dL (ref 0–200)
Triglycerides: 97 mg/dL (ref 0.0–149.0)

## 2011-07-03 LAB — AMMONIA: Ammonia: 38 umol/L — ABNORMAL HIGH (ref 11–35)

## 2011-07-03 MED ORDER — AMPHETAMINE-DEXTROAMPHET ER 10 MG PO CP24: 10.0000 mg | ORAL_CAPSULE | Freq: Every day | ORAL | Status: DC

## 2011-07-03 MED ORDER — MORPHINE SULFATE CR 30 MG PO TB12: 30.0000 mg | ORAL_TABLET | Freq: Two times a day (BID) | ORAL | Status: DC

## 2011-07-03 NOTE — Progress Notes (Signed)
Subjective:    Patient ID: Kurt Reid, male    DOB: [birth year], 61 y.o.   MRN: [id number]  HPI  Here with wife ; Here to f/u; overall doing ok,  Pt denies chest pain, increased sob or doe, wheezing, orthopnea, PND, increased LE swelling, palpitations, dizziness or syncope.  Pt denies new neurological symptoms such as new headache, or facial or extremity weakness or numbness   Pt denies polydipsia, polyuria, or low sugar symptoms such as weakness or confusion improved with po intake.  Pt states overall good compliance with meds, trying to follow lower cholesterol, diabetic diet, wt overall stable but little exercise however.  Did have recent EGD per GI, Dr Meisnehimer, varices noted worse, for repeat with tx soon per wife.   Pt denies fever, wt loss, night sweats, loss of appetite, or other constitutional symptoms.  Denies worsening reflux, dysphagia, abd pain, n/v, bowel change or blood. Current tx for ADD and pain seem to be working well, pain controlled, no worsening concentration issues or task completion Past Medical History  Diagnosis Date  . Depression   . Bipolar 1 disorder   . ADHD (attention deficit hyperactivity disorder)   . GERD (gastroesophageal reflux disease)   . Hepatitis C   . Hyperlipidemia   . Low back pain     chronic  . Allergic rhinitis   . Seizure disorder     H/O  . Benign prostatic hypertrophy   . Migraine   . ETOH abuse     hx  . Nephrolithiasis     hx  . Sinusitis     recurrent  . Diverticulosis of colon   . Anxiety   . ADHD 09/23/2006  . ALLERGIC RHINITIS 09/23/2006  . ANXIETY 04/19/2009  . BENIGN PROSTATIC HYPERTROPHY 11/02/2007  . BIPOLAR AFFECTIVE DISORDER 09/23/2006  . Chronic pain syndrome 01/18/2009  . DEPRESSION 09/23/2006  . DIVERTICULOSIS, COLON 04/19/2009  . ESOPHAGEAL VARICES 04/19/2009  . GERD 09/23/2006  . HEPATITIS C 09/23/2006  . HYPERLIPIDEMIA 09/23/2006  . INSOMNIA-SLEEP DISORDER-UNSPEC 04/19/2009  . LOW BACK PAIN 09/23/2006  .  NEPHROLITHIASIS, HX OF 11/02/2007  . SEIZURE DISORDER 09/23/2006  . 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 Back surgury     x 5  . Knee surgury     x 1  . Cheekbone     hx of fx  . Tonsillectomy   . S/p sinus surgury 2010    Dr. Zara Chess    reports that he has been smoking.  He does not have any smokeless tobacco history on file. He reports that he does not drink alcohol or use illicit drugs. family history includes Asthma in his daughter; Dementia in his father; Depression in his mother; Hyperlipidemia in his mother; Hypertension in his mother; and Stroke in his mother. Allergies  Allergen Reactions  . Duloxetine     REACTION: memory dysfunction, dizziness  . Latex   . Levofloxacin Nausea Only  . Mercury   . Sulfonamide Derivatives    Current Outpatient Prescriptions on File Prior to Visit  Medication Sig Dispense Refill  . aspirin (ASPIRIN EC) 81 MG EC tablet Take 81 mg by mouth daily.        . carisoprodol (SOMA) 350 MG tablet Take 1 tablet (350 mg total) by mouth 2 (two) times daily as needed.  60 tablet  2  . fluticasone (FLONASE) 50 MCG/ACT nasal spray 2 sprays by Nasal route  daily.  16 g  2  . gabapentin (NEURONTIN) 300 MG capsule Take 2 capsules (600 mg total) by mouth 3 (three) times daily.  180 capsule  5  . gabapentin (NEURONTIN) 300 MG capsule TAKE ONE CAPSULE BY MOUTH 4 TIMES A DAY  120 capsule  5  . glipiZIDE (GLUCOTROL XL) 10 MG 24 hr tablet Take 1 tablet (10 mg total) by mouth 2 (two) times daily.  180 tablet  3  . glucose blood (ONE TOUCH ULTRA TEST) test strip Use as instructed  100 each  12  . Hydrocodone-APAP-Dietary Prod (HYDROCODONE-APAP-NUTRIT SUPP) 10-325 MG MISC 1 tab by mouth every 6 hrs as needed for pain  120 each  2  . lactulose (CHRONULAC) 10 GM/15ML solution Take 30 mLs (20 g total) by mouth daily.  1892 mL  1  . [employer] (ONE TOUCH SURESOFT) MISC 1 application by Does not apply route 2  (two) times daily.  200 each  11  . metFORMIN (GLUCOPHAGE-XR) 500 MG 24 hr tablet 4 tabs by mouth in the AM  360 tablet  3  . omeprazole (PRILOSEC) 20 MG capsule Take 1 capsule (20 mg total) by mouth daily.  90 capsule  3  . ondansetron (ZOFRAN) 4 MG tablet Take 4 mg by mouth every 8 (eight) hours as needed. For nausea and vomiting       . venlafaxine (EFFEXOR-XR) 150 MG 24 hr capsule 2 tabs by mouth in the AM  180 capsule  3  . DISCONTD: amphetamine-dextroamphetamine (ADDERALL XR, 10MG ,) 10 MG 24 hr capsule Take 1 capsule (10 mg total) by mouth daily. To fill Jun 21, 2011  30 capsule  0   Review of Systems Review of Systems  Constitutional: Negative for diaphoresis and unexpected weight change.  Eyes: Negative for photophobia and visual disturbance.  Respiratory: Negative for choking and stridor.   Gastrointestinal: Negative for vomiting and blood in stool.  Genitourinary: Negative for hematuria and decreased urine volume.  Musculoskeletal: Negative for gait problem.  Skin: Negative for color change and wound.     Objective:   Physical Exam BP 120/68  Pulse 95  Temp(Src) 99.9 F (37.7 C) (Oral)  Ht 6\' 1"  (1.854 m)  Wt 222 lb 8 oz (100.925 kg)  BMI 29.36 kg/m2  SpO2 94% Physical Exam  VS noted, not ill appearing Constitutional: Pt appears well-developed and well-nourished.  HENT: Head: Normocephalic.  Right Ear: External ear normal.  Left Ear: External ear normal.  Eyes: Conjunctivae and EOM are normal. Pupils are equal, round, and reactive to light.  Neck: Normal range of motion. Neck supple.  Cardiovascular: Normal rate and regular rhythm.   Pulmonary/Chest: Effort normal and breath sounds normal.  Abd:  Soft, NT, non-distended, + BS Neurological: Pt is alert. Not confused or manic  Skin: Skin is warm. No erythema.  Psychiatric: Pt behavior is normal. 1+ nervous   Assessment & Plan:

## 2011-07-03 NOTE — Assessment & Plan Note (Addendum)
stable overall by hx and exam,, and pt to continue medical treatment as before, for med refills 

## 2011-07-03 NOTE — Assessment & Plan Note (Signed)
stable overall by hx and exam, most recent data reviewed with pt, and pt to continue medical treatment as before Lab Results  Component Value Date   HGBA1C 8.1* 07/03/2011

## 2011-07-03 NOTE — Patient Instructions (Signed)
Continue all other medications as before You are given the refills today Please go to LAB in the Basement for the blood and/or urine tests to be done today You will be contacted by phone if any changes need to be made immediately.  Otherwise, you will receive a letter about your results with an explanation. Please return in 4 mo with Lab testing done 3-5 days before

## 2011-07-03 NOTE — Assessment & Plan Note (Addendum)
stable overall by hx and exam, , and pt to continue medical treatment as before, for med refills today

## 2011-07-03 NOTE — Assessment & Plan Note (Signed)
stable overall by hx and exam, most recent data reviewed with pt, and pt to continue medical treatment as before, for ammonia level

## 2011-07-04 ENCOUNTER — Encounter: Payer: Self-pay | Admitting: Internal Medicine

## 2011-07-04 ENCOUNTER — Telehealth: Payer: Self-pay

## 2011-07-04 ENCOUNTER — Other Ambulatory Visit: Payer: Self-pay | Admitting: Internal Medicine

## 2011-07-04 MED ORDER — SITAGLIPTIN PHOSPHATE 100 MG PO TABS: 100.0000 mg | ORAL_TABLET | Freq: Every day | ORAL | Status: DC

## 2011-07-04 MED ORDER — INSULIN PEN NEEDLE 31G X 5 MM MISC: 1.0000 "application " | Status: DC

## 2011-07-04 MED ORDER — INSULIN GLARGINE 100 UNIT/ML ~~LOC~~ SOLN: 10.0000 [IU] | Freq: Every day | SUBCUTANEOUS | Status: DC

## 2011-07-04 NOTE — Telephone Encounter (Signed)
Ok to start lantus 10 units per day (either in am or pm, just need to do at similar time each day) - done per emr (with needles)  Continue all other medications as before - no need to stop anything  - except OK to not take the [place]

## 2011-07-04 NOTE — Telephone Encounter (Signed)
The patients wife and patient are concerned about the side effects of Januvia and would like to consider taking insulin.  The patients wife informed that the patient went only to one of the three diabetes education classes. Please advise

## 2011-07-05 NOTE — Telephone Encounter (Signed)
Called the patients wife Almyra Free informed of insulin instructions and to continue all other meds. As instructed per OV MD at last OV, and not to take Januvia.

## 2011-07-22 ENCOUNTER — Ambulatory Visit: Payer: Medicare Other | Admitting: Internal Medicine

## 2011-07-24 ENCOUNTER — Ambulatory Visit (INDEPENDENT_AMBULATORY_CARE_PROVIDER_SITE_OTHER): Payer: Medicare Other | Admitting: Internal Medicine

## 2011-07-24 ENCOUNTER — Encounter: Payer: Self-pay | Admitting: Internal Medicine

## 2011-07-24 ENCOUNTER — Other Ambulatory Visit: Payer: Medicare Other

## 2011-07-24 VITALS — BP 154/80 | HR 95 | Temp 98.6°F | Resp 16 | Wt 226.0 lb

## 2011-07-24 DIAGNOSIS — L02211 Cutaneous abscess of abdominal wall: Secondary | ICD-10-CM | POA: Insufficient documentation

## 2011-07-24 DIAGNOSIS — L02219 Cutaneous abscess of trunk, unspecified: Secondary | ICD-10-CM

## 2011-07-24 DIAGNOSIS — Z22322 Carrier or suspected carrier of Methicillin resistant Staphylococcus aureus: Secondary | ICD-10-CM

## 2011-07-24 MED ORDER — DOXYCYCLINE HYCLATE 100 MG PO TBEC: 100.0000 mg | DELAYED_RELEASE_TABLET | Freq: Two times a day (BID) | ORAL | Status: AC

## 2011-07-24 NOTE — Patient Instructions (Signed)
Incision and Drainage  Incision and drainage (I&D) is a procedure in which a cavity-like structure (cystic structure) is opened and drained. The cyst to be drained usually contains material such as pus, fluid, or blood. Gauze is sometimes packed into the cut (incision). Keeping a drain or piece of gauze in the incision keeps the skin from healing first. This helps stop the cyst from forming again.  HOME CARE INSTRUCTIONS    Only take over-the-counter or prescription medicines for pain, discomfort, or fever as directed by your caregiver. Use these only if your caregiver has not given medicines that would interfere.   See your caregiver as directed for a recheck.   If medicines (antibiotics) that kill germs were prescribed, take them as directed.  SEEK MEDICAL CARE IF:    You develop increased pain, swelling, redness, drainage, or bleeding in the wound.   You develop signs of an infection. These signs include muscle aches, chills, or a general ill feeling.   You have a fever.  MAKE SURE YOU:    Understand these instructions.   Will watch your condition.   Will get help right away if you are not doing well or get worse.  Document Released: 08/14/2000 Document Revised: 02/07/2011 Document Reviewed: 10/09/2007  [employer] Patient Information 2012 [employer]  Abscess  An abscess (boil or furuncle) is an infected area that contains a collection of pus.   SYMPTOMS  Signs and symptoms of an abscess include pain, tenderness, redness, or hardness. You may feel a moveable soft area under your skin. An abscess can occur anywhere in the body.   TREATMENT   A surgical cut (incision) may be made over your abscess to drain the pus. Gauze may be packed into the space or a drain may be looped through the abscess cavity (pocket). This provides a drain that will allow the cavity to heal from the inside outwards. The abscess may be painful for a few days, but should feel much better if it was drained.   Your abscess, if seen  early, may not have localized and may not have been drained. If not, another appointment may be required if it does not get better on its own or with medications.  HOME CARE INSTRUCTIONS    Only take over-the-counter or prescription medicines for pain, discomfort, or fever as directed by your caregiver.   Take your antibiotics as directed if they were prescribed. Finish them even if you start to feel better.   Keep the skin and clothes clean around your abscess.   If the abscess was drained, you will need to use gauze dressing to collect any draining pus. Dressings will typically need to be changed 3 or more times a day.   The infection may spread by skin contact with others. Avoid skin contact as much as possible.   Practice good hygiene. This includes regular hand washing, cover any draining skin lesions, and do not share personal care items.   If you participate in sports, do not share athletic equipment, towels, whirlpools, or personal care items. Shower after every practice or tournament.   If a draining area cannot be adequately covered:   Do not participate in sports.   Children should not participate in day care until the wound has healed or drainage stops.   If your caregiver has given you a follow-up appointment, it is very important to keep that appointment. Not keeping the appointment could result in a much worse infection, chronic or permanent injury, pain,   and disability. If there is any problem keeping the appointment, you must call back to this facility for assistance.  SEEK MEDICAL CARE IF:    You develop increased pain, swelling, redness, drainage, or bleeding in the wound site.   You develop signs of generalized infection including muscle aches, chills, fever, or a general ill feeling.   You have an oral temperature above 102 F (38.9 C).  MAKE SURE YOU:    Understand these instructions.   Will watch your condition.   Will get help right away if you are not doing well or get  worse.  Document Released: 11/28/2004 Document Revised: 02/07/2011 Document Reviewed: 09/22/2007  [employer] Patient Information 2012 [employer]

## 2011-07-24 NOTE — Progress Notes (Signed)
  Subjective:    Patient ID: Kurt Reid, male    DOB: May 18, 1950, 61 y.o.   MRN: [id number]  HPI New to me he complains of a painful/swollen area on his right lower abdomen for 3 days, he has a prior history of MRSA.   Review of Systems  Constitutional: Negative for fever, chills, diaphoresis, activity change, appetite change, fatigue and unexpected weight change.  HENT: Negative.   Eyes: Negative.   Respiratory: Negative for apnea, choking, shortness of breath, wheezing and stridor.   Cardiovascular: Negative for chest pain, palpitations and leg swelling.  Gastrointestinal: Negative for nausea, abdominal pain, diarrhea, constipation and abdominal distention.  Genitourinary: Negative.   Skin: Positive for wound. Negative for color change, pallor and rash.  Neurological: Negative.   Hematological: Negative for adenopathy. Does not bruise/bleed easily.  Psychiatric/Behavioral: Negative.        Objective:   Physical Exam  Vitals reviewed. Constitutional: He appears well-developed and well-nourished. No distress.  HENT:  Head: Normocephalic and atraumatic.  Mouth/Throat: No oropharyngeal exudate.  Eyes: Conjunctivae are normal. Right eye exhibits no discharge. Left eye exhibits no discharge. No scleral icterus.  Neck: Normal range of motion. Neck supple. No JVD present. No tracheal deviation present. No thyromegaly present.  Cardiovascular: Normal rate, regular rhythm, normal heart sounds and intact distal pulses.  Exam reveals no gallop and no friction rub.   No murmur heard. Pulmonary/Chest: Effort normal and breath sounds normal. No respiratory distress. He has no wheezes. He has no rales. He exhibits no tenderness.  Abdominal: Soft. Bowel sounds are normal. He exhibits no distension and no mass. There is no tenderness. There is no rebound and no guarding.         On the right lower abd wall there is a 4 mm area of eschar with surrounding erythema, induration, and fluctuance.  There is no streaking.    Lymphadenopathy:    He has no cervical adenopathy.  Skin: Skin is warm, dry and intact. No abrasion and no rash noted. He is not diaphoretic. No pallor.       Proc note: the area over the right lower abd was prepped and draped in sterile fashion and local anesthesia was obtained with lido 2% with epi, a 4 mm punch incision was made and a moderate amount of bloody/purulent exudate was obtained and a deep cavity was explored and irrigated with H202, a clx was obtained and sent, the cavity was packed with iodoform and a dressing was applied, he tolerated the procedure well     Lab Results  Component Value Date   WBC 10.7* 01/22/2011   HGB 11.9* 01/22/2011   HCT 36.0* 01/22/2011   PLT 180.0 01/22/2011   GLUCOSE 87 07/03/2011   CHOL 140 07/03/2011   TRIG 97.0 07/03/2011   HDL 52.20 07/03/2011   LDLDIRECT 81.5 11/02/2007   LDLCALC 68 07/03/2011   ALT 19 01/22/2011   AST 27 01/22/2011   NA 140 07/03/2011   K 4.5 07/03/2011   CL 110 07/03/2011   CREATININE 0.7 07/03/2011   BUN 8 07/03/2011   CO2 25 07/03/2011   TSH 2.89 01/22/2011   PSA 0.05* 01/22/2011   HGBA1C 8.1* 07/03/2011       Assessment & Plan:

## 2011-07-25 ENCOUNTER — Encounter: Payer: Self-pay | Admitting: Internal Medicine

## 2011-07-25 NOTE — Assessment & Plan Note (Signed)
I have asked him to start doxycycline

## 2011-07-25 NOTE — Assessment & Plan Note (Signed)
I and D was done, culture was sent, he will return in 2 days for recheck

## 2011-07-26 ENCOUNTER — Encounter: Payer: Self-pay | Admitting: Internal Medicine

## 2011-07-26 ENCOUNTER — Ambulatory Visit (INDEPENDENT_AMBULATORY_CARE_PROVIDER_SITE_OTHER): Payer: Medicare Other | Admitting: Internal Medicine

## 2011-07-26 VITALS — BP 148/86 | HR 76 | Temp 98.8°F | Resp 16 | Wt 226.0 lb

## 2011-07-26 DIAGNOSIS — L03319 Cellulitis of trunk, unspecified: Secondary | ICD-10-CM

## 2011-07-26 DIAGNOSIS — Z22322 Carrier or suspected carrier of Methicillin resistant Staphylococcus aureus: Secondary | ICD-10-CM

## 2011-07-26 DIAGNOSIS — L02211 Cutaneous abscess of abdominal wall: Secondary | ICD-10-CM

## 2011-07-26 NOTE — Patient Instructions (Signed)
Abscess  An abscess (boil or furuncle) is an infected area that contains a collection of pus.    SYMPTOMS  Signs and symptoms of an abscess include pain, tenderness, redness, or hardness. You may feel a moveable soft area under your skin. An abscess can occur anywhere in the body.    TREATMENT    A surgical cut (incision) may be made over your abscess to drain the pus. Gauze may be packed into the space or a drain may be looped through the abscess cavity (pocket). This provides a drain that will allow the cavity to heal from the inside outwards. The abscess may be painful for a few days, but should feel much better if it was drained.    Your abscess, if seen early, may not have localized and may not have been drained. If not, another appointment may be required if it does not get better on its own or with medications.  HOME CARE INSTRUCTIONS     Only take over-the-counter or prescription medicines for pain, discomfort, or fever as directed by your caregiver.    Take your antibiotics as directed if they were prescribed. Finish them even if you start to feel better.    Keep the skin and clothes clean around your abscess.    If the abscess was drained, you will need to use gauze dressing to collect any draining pus. Dressings will typically need to be changed 3 or more times a day.    The infection may spread by skin contact with others. Avoid skin contact as much as possible.    Practice good hygiene. This includes regular hand washing, cover any draining skin lesions, and do not share personal care items.    If you participate in sports, do not share athletic equipment, towels, whirlpools, or personal care items. Shower after every practice or tournament.    If a draining area cannot be adequately covered:    Do not participate in sports.    Children should not participate in day care until the wound has healed or drainage stops.     If your caregiver has given you a follow-up appointment, it is very important to keep that appointment. Not keeping the appointment could result in a much worse infection, chronic or permanent injury, pain, and disability. If there is any problem keeping the appointment, you must call back to this facility for assistance.   SEEK MEDICAL CARE IF:     You develop increased pain, swelling, redness, drainage, or bleeding in the wound site.    You develop signs of generalized infection including muscle aches, chills, fever, or a general ill feeling.    You have an oral temperature above 102 F (38.9 C).   MAKE SURE YOU:     Understand these instructions.    Will watch your condition.    Will get help right away if you are not doing well or get worse.   Document Released: 11/28/2004 Document Revised: 02/07/2011 Document Reviewed: 09/22/2007  [employer] Patient Information 2012 [employer].    MRSA Overview  MRSA stands for methicillin-resistant Staphylococcus aureus. It is a type of bacteria that is resistant to some common antibiotics. It can cause infections in the skin and many other places in the body. Staphylococcus aureus, often called "staph," is a bacteria that normally lives on the skin or in the nose. Staph on the surface of the skin or in the nose does not cause problems. However, if the staph enters the body through   a cut, wound, or break in the skin, an infection can happen.  Up until recently, infections with the MRSA type of staph mainly occurred in hospitals and other healthcare settings. There are now increasing problems with MRSA infections in the community as well. Infections with MRSA may be very serious or even life-threatening. Most MRSA infections are acquired in one of two ways:   Healthcare-associated MRSA (HA-MRSA)     This can be acquired by people in any healthcare setting. MRSA can be a big problem for hospitalized people, people in nursing homes, people in rehabilitation facilities, people with weakened immune systems, dialysis patients, and those who have had surgery.    Community-associated MRSA (CA-MRSA)    Community spread of MRSA is becoming more common. It is known to spread in crowded settings, in jails and prisons, and in situations where there is close skin-to-skin contact, such as during sporting events or in locker rooms. MRSA can be spread through shared items, such as children's toys, razors, towels, or sports equipment.   CAUSES    All staph, including MRSA, are normally harmless unless they enter the body through a scratch, cut, or wound, such as with surgery. All staph, including MRSA, can be spread from person-to-person by touching contaminated objects or through direct contact.  SPECIAL GROUPS  MRSA can present problems for special groups of people. Some of these groups include:   Breastfeeding women.    The most common problem is MRSA infection of the breast (mastitis). There is evidence that MRSA can be passed to an infant from infected breast milk. Your caregiver may recommend that you stop breastfeeding until the mastitis is under control.    If you are breastfeeding and have a MRSA infection in a place other than the breast, you may usually continue breastfeeding while under treatment. If taking antibiotics, ask your caregiver if it is safe to continue breastfeeding while taking your prescribed medicines.    Neonates (babies from birth to 1 month old) and infants (babies from 1 month to 1 year old).     There is evidence that MRSA can be passed to a newborn at birth if the mother has MRSA on the skin, in or around the birth canal, or an infection in the uterus, cervix, or vagina. MRSA infection can have the same appearance as a normal newborn or infant rash or several other skin infections. This can make it hard to diagnose MRSA.    Immune compromised people.    If you have an immune system problem, you may have a higher chance of developing a MRSA infection.    People after any type of surgery.    Staph in general, including MRSA, is the most common cause of infections occurring at the site of recent surgery.    People on long-term steroid medicines.    These kinds of medicines can lower your resistance to infection. This can increase your chance of getting MRSA.    People who have had frequent hospitalizations, live in nursing homes or other residential care facilities, have venous or urinary catheters, or have taken multiple courses of antibiotic therapy for any reason.   DIAGNOSIS    Diagnosis of MRSA is done by cultures of fluid samples that may come from:   Swabs taken from cuts or wounds in infected areas.    Nasal swabs.    Saliva or deep cough specimens from the lungs (sputum).    Urine.    Blood.   Many people   are "colonized" with MRSA but have no signs of infection. This means that people carry the MRSA germ on their skin or in their nose and may never develop MRSA infection.    TREATMENT    Treatment varies and is based on how serious, how deep, or how extensive the infection is. For example:   Some skin infections, such as a small boil or abscess, may be treated by draining yellowish-white fluid (pus) from the site of the infection.    Deeper or more widespread soft tissue infections are usually treated with surgery to drain pus and with antibiotic medicine given by vein or by mouth. This may be recommended even if you are pregnant.    Serious infections may require a hospital stay.    If antibiotics are given, they may be needed for several weeks.  PREVENTION    Because many people are colonized with staph, including MRSA, preventing the spread of the bacteria from person-to-person is most important. The best way to prevent the spread of bacteria and other germs is through proper hand washing or by using alcohol-based hand disinfectants. The following are other ways to help prevent MRSA infection within the hospital and community settings.     Healthcare settings:    Strict hand washing or hand disinfection procedures need to be followed before and after touching every patient.    Patients infected with MRSA are placed in isolation to prevent the spread of the bacteria.    Healthcare workers need to wear disposable gowns and gloves when touching or caring for patients infected with MRSA. Visitors may also be asked to wear a gown and gloves.    Hospital surfaces need to be disinfected frequently.    Community settings:    Wash your hands frequently with soap and water for at least 15 seconds. Otherwise, use alcohol-based hand disinfectants when soap and water is not available.    Make sure people who live with you wash their hands often, too.    Do not share personal items. For example, avoid sharing razors and other personal hygiene items, towels, clothing, and athletic equipment.    Wash and dry your clothes and bedding at the warmest temperatures recommended on the labels.    Keep wounds covered. Pus from infected sores may contain MRSA and other bacteria. Keep cuts and abrasions clean and covered with germ-free (sterile), dry bandages until they are healed.    If you have a wound that appears infected, ask your caregiver if a culture for MRSA and other bacteria should be done.    If you are breastfeeding, talk to your caregiver about MRSA. You may be asked to temporarily stop breastfeeding.   HOME CARE INSTRUCTIONS      Take your antibiotics as directed. Finish them even if you start to feel better.    Avoid close contact with those around you as much as possible. Do not use towels, razors, toothbrushes, bedding, or other items that will be used by others.    To fight the infection, follow your caregiver's instructions for wound care. Wash your hands before and after changing your bandages.    If you have an intravascular device, such as a catheter, make sure you know how to care for it.    Be sure to tell any healthcare providers that you have MRSA so they are aware of your infection.   SEEK IMMEDIATE MEDICAL CARE IF:     The infection appears to be getting worse. Signs include:      Increased warmth, redness, or tenderness around the wound site.    A red line that extends from the infection site.    A dark color in the area around the infection.    Wound drainage that is tan, yellow, or green.    A bad smell coming from the wound.    You feel sick to your stomach (nauseous) and throw up (vomit) or cannot keep medicine down.    You have a fever.    Your baby is older than 3 months with a rectal temperature of 102 F (38.9 C) or higher.    Your baby is 3 months old or younger with a rectal temperature of 100.4 F (38 C) or higher.    You have difficulty breathing.   MAKE SURE YOU:     Understand these instructions.    Will watch your condition.    Will get help right away if you are not doing well or get worse.   Document Released: 02/18/2005 Document Revised: 02/07/2011 Document Reviewed: 05/23/2010  [employer] Patient Information 2012 [employer].

## 2011-07-26 NOTE — Assessment & Plan Note (Signed)
This is improving nicely, no need to re-pack it today, the clx looks like it will be + for MRSA so he will continue on the doxycyline

## 2011-07-26 NOTE — Assessment & Plan Note (Signed)
clx results look suspicious so he will continue on the doxycycline

## 2011-07-26 NOTE — Progress Notes (Signed)
  Subjective:    Patient ID: Kurt Reid, male    DOB: 1950-04-01, 61 y.o.   MRN: [id number]  HPI He returns for f/up and he tells me that the area on the right lower abd feels much better. He feels well and offers no complaints today.   Review of Systems  Constitutional: Negative for fever, chills, diaphoresis, appetite change, fatigue and unexpected weight change.  HENT: Negative.   Eyes: Negative.   Respiratory: Negative for choking, shortness of breath, wheezing and stridor.   Cardiovascular: Negative for chest pain, palpitations and leg swelling.  Gastrointestinal: Negative for nausea, vomiting, abdominal pain, diarrhea and abdominal distention.  Genitourinary: Negative.   Musculoskeletal: Negative.   Skin: Positive for wound. Negative for color change, pallor and rash.  Neurological: Negative.   Hematological: Negative for adenopathy. Does not bruise/bleed easily.  Psychiatric/Behavioral: Negative.        Objective:   Physical Exam  Vitals reviewed. Constitutional: He is oriented to person, place, and time. He appears well-developed and well-nourished. No distress.  HENT:  Head: Normocephalic and atraumatic.  Mouth/Throat: Oropharynx is clear and moist. No oropharyngeal exudate.  Eyes: Conjunctivae are normal. Right eye exhibits no discharge. Left eye exhibits no discharge. No scleral icterus.  Neck: Normal range of motion. Neck supple. No tracheal deviation present. No thyromegaly present.  Cardiovascular: Normal rate, regular rhythm, normal heart sounds and intact distal pulses.  Exam reveals no gallop and no friction rub.   No murmur heard. Pulmonary/Chest: Effort normal and breath sounds normal. No stridor. No respiratory distress. He has no wheezes. He has no rales. He exhibits no tenderness.  Abdominal: Soft. Normal appearance and bowel sounds are normal. He exhibits no shifting dullness, no distension, no pulsatile liver, no fluid wave, no abdominal bruit, no  ascites, no pulsatile midline mass and no mass. There is no splenomegaly or hepatomegaly. There is no tenderness. There is no rebound and no guarding.    Musculoskeletal: Normal range of motion. He exhibits no edema and no tenderness.  Lymphadenopathy:    He has no cervical adenopathy.  Neurological: He is oriented to person, place, and time.  Skin: Skin is warm and dry. No rash noted. He is not diaphoretic. No erythema. No pallor.  Psychiatric: He has a normal mood and affect. His behavior is normal. Judgment and thought content normal.          Assessment & Plan:

## 2011-07-27 LAB — WOUND CULTURE: Gram Stain: NONE SEEN

## 2011-08-09 ENCOUNTER — Ambulatory Visit (INDEPENDENT_AMBULATORY_CARE_PROVIDER_SITE_OTHER): Payer: Medicare Other | Admitting: Internal Medicine

## 2011-08-09 ENCOUNTER — Ambulatory Visit (INDEPENDENT_AMBULATORY_CARE_PROVIDER_SITE_OTHER)
Admission: RE | Admit: 2011-08-09 | Discharge: 2011-08-09 | Disposition: A | Discharge status: Home / Self Care | Payer: Medicare Other | Source: Ambulatory Visit | Attending: Internal Medicine | Admitting: Internal Medicine

## 2011-08-09 ENCOUNTER — Ambulatory Visit: Payer: Medicare Other | Admitting: Internal Medicine

## 2011-08-09 ENCOUNTER — Other Ambulatory Visit: Payer: Self-pay | Admitting: Internal Medicine

## 2011-08-09 ENCOUNTER — Encounter: Payer: Self-pay | Admitting: Internal Medicine

## 2011-08-09 ENCOUNTER — Other Ambulatory Visit (INDEPENDENT_AMBULATORY_CARE_PROVIDER_SITE_OTHER): Payer: Medicare Other

## 2011-08-09 VITALS — BP 120/80 | HR 97 | Temp 100.1°F | Ht 73.0 in | Wt 225.4 lb

## 2011-08-09 DIAGNOSIS — M5416 Radiculopathy, lumbar region: Secondary | ICD-10-CM

## 2011-08-09 DIAGNOSIS — IMO0002 Reserved for concepts with insufficient information to code with codable children: Secondary | ICD-10-CM

## 2011-08-09 DIAGNOSIS — R509 Fever, unspecified: Secondary | ICD-10-CM

## 2011-08-09 DIAGNOSIS — R1011 Right upper quadrant pain: Secondary | ICD-10-CM

## 2011-08-09 DIAGNOSIS — R10813 Right lower quadrant abdominal tenderness: Secondary | ICD-10-CM

## 2011-08-09 DIAGNOSIS — L0291 Cutaneous abscess, unspecified: Secondary | ICD-10-CM

## 2011-08-09 DIAGNOSIS — L02219 Cutaneous abscess of trunk, unspecified: Secondary | ICD-10-CM

## 2011-08-09 DIAGNOSIS — L03319 Cellulitis of trunk, unspecified: Secondary | ICD-10-CM

## 2011-08-09 DIAGNOSIS — L02211 Cutaneous abscess of abdominal wall: Secondary | ICD-10-CM

## 2011-08-09 LAB — URINALYSIS, ROUTINE W REFLEX MICROSCOPIC
Hgb urine dipstick: NEGATIVE
Nitrite: NEGATIVE
Total Protein, Urine: NEGATIVE
Urine Glucose: NEGATIVE
pH: 6 (ref 5.0–8.0)

## 2011-08-09 LAB — BASIC METABOLIC PANEL
BUN: 12 mg/dL (ref 6–23)
Calcium: 8.7 mg/dL (ref 8.4–10.5)
Creatinine, Ser: 0.7 mg/dL (ref 0.4–1.5)
GFR: 119.69 mL/min (ref >60.00)
Glucose, Bld: 122 mg/dL — ABNORMAL HIGH (ref 70–99)

## 2011-08-09 LAB — HEPATIC FUNCTION PANEL
Albumin: 3 g/dL — ABNORMAL LOW (ref 3.5–5.2)
Total Bilirubin: 0.8 mg/dL (ref 0.3–1.2)

## 2011-08-09 LAB — CBC WITH DIFFERENTIAL/PLATELET
Basophils Relative: 0.6 % (ref 0.0–3.0)
Eosinophils Relative: 2.9 % (ref 0.0–5.0)
HCT: 36.3 % — ABNORMAL LOW (ref 39.0–52.0)
Lymphs Abs: 3.2 10*3/uL (ref 0.7–4.0)
MCV: 85.8 fl (ref 78.0–100.0)
Monocytes Absolute: 1 10*3/uL (ref 0.1–1.0)
Monocytes Relative: 8.6 % (ref 3.0–12.0)
Neutrophils Relative %: 59.9 % (ref 43.0–77.0)
RBC: 4.23 Mil/uL (ref 4.22–5.81)
WBC: 11.4 10*3/uL — ABNORMAL HIGH (ref 4.5–10.5)

## 2011-08-09 LAB — SEDIMENTATION RATE: Sed Rate: 25 mm/hr — ABNORMAL HIGH (ref 0–22)

## 2011-08-09 LAB — LIPASE: Lipase: 30 U/L (ref 11.0–59.0)

## 2011-08-09 MED ORDER — IOHEXOL 300 MG/ML  SOLN
100.0000 mL | Freq: Once | INTRAMUSCULAR | Status: AC | PRN
  Administered 2011-08-09: 100 mL via INTRAVENOUS

## 2011-08-09 NOTE — Assessment & Plan Note (Signed)
For CT as above- r/o abscess

## 2011-08-09 NOTE — Assessment & Plan Note (Signed)
Cont same tx, no local sign of tender/swelling but consider MRI lumbar if FUO eval above not helpful

## 2011-08-09 NOTE — Progress Notes (Signed)
Subjective:    Patient ID: Kurt Reid, male    DOB: 1950-07-26, 61 y.o.   MRN: [id number]  HPI  Here to f/u with wife;  Pt somewhat difficult historian, but has been tx recently for abd wall RLQ abscess with 2 wks doxycycline, and today improved with a mild reddening of the healing site but no worsening red/tender/swelling/drainage/red streaks.  Unfortunately today with 2 days onset worsening symptoms of abd pain to the RUQ, mild, sharp but without GI or worsening GU symptoms, and with tenderness to the RLQ as well.  Has return it seems of low grade temp now 100.1 without higher fever or chills, and ? Coincidently with 2 days worsening left radiculopathy with left lower back pain and LLE distal pain/numb/weakness overall mild, and not worse than recurrent episodes in the past with known lumbar disc disease.  No HA, sinus pain/ear symtpoms, ST, cough, CP or sob.  Pt denies wheezing, orthopnea, PND, increased LE swelling, palpitations, dizziness or syncope.  No rash or worsening joint pain.  Does also mention a sharp severe pelvic pain that has occurred spontaneously fleeting but "makes me grab myself" pointing to the genital area, intermittent rare over the past 2 months. Past Medical History  Diagnosis Date  . Depression   . Bipolar 1 disorder   . ADHD (attention deficit hyperactivity disorder)   . GERD (gastroesophageal reflux disease)   . Hepatitis C   . Hyperlipidemia   . Low back pain     chronic  . Allergic rhinitis   . Seizure disorder     H/O  . Benign prostatic hypertrophy   . Migraine   . ETOH abuse     hx  . Nephrolithiasis     hx  . Sinusitis     recurrent  . Diverticulosis of colon   . Anxiety   . ADHD 09/23/2006  . ALLERGIC RHINITIS 09/23/2006  . ANXIETY 04/19/2009  . BENIGN PROSTATIC HYPERTROPHY 11/02/2007  . BIPOLAR AFFECTIVE DISORDER 09/23/2006  . Chronic pain syndrome 01/18/2009  . DEPRESSION 09/23/2006  . DIVERTICULOSIS, COLON 04/19/2009  . ESOPHAGEAL VARICES  04/19/2009  . GERD 09/23/2006  . HEPATITIS C 09/23/2006  . HYPERLIPIDEMIA 09/23/2006  . INSOMNIA-SLEEP DISORDER-UNSPEC 04/19/2009  . LOW BACK PAIN 09/23/2006  . NEPHROLITHIASIS, HX OF 11/02/2007  . SEIZURE DISORDER 09/23/2006  . 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 Back surgury     x 5  . Knee surgury     x 1  . Cheekbone     hx of fx  . Tonsillectomy   . S/p sinus surgury 2010    Dr. Zara Chess    reports that he has been smoking.  He has never used smokeless tobacco. He reports that he does not drink alcohol or use illicit drugs. family history includes Asthma in his daughter; Dementia in his father; Depression in his mother; Hyperlipidemia in his mother; Hypertension in his mother; and Stroke in his mother. Allergies  Allergen Reactions  . Duloxetine     REACTION: memory dysfunction, dizziness  . Latex   . Levofloxacin Nausea Only  . Mercury   . Sulfonamide Derivatives    Current Outpatient Prescriptions on File Prior to Visit  Medication Sig Dispense Refill  . amphetamine-dextroamphetamine (ADDERALL XR, 10MG ,) 10 MG 24 hr capsule Take 1 capsule (10 mg total) by mouth daily. To fill September 19, 2011  30 capsule  0  . aspirin (ASPIRIN  EC) 81 MG EC tablet Take 81 mg by mouth daily.        . carisoprodol (SOMA) 350 MG tablet Take 1 tablet (350 mg total) by mouth 2 (two) times daily as needed.  60 tablet  2  . gabapentin (NEURONTIN) 300 MG capsule Take 2 capsules (600 mg total) by mouth 3 (three) times daily.  180 capsule  5  . gabapentin (NEURONTIN) 300 MG capsule TAKE ONE CAPSULE BY MOUTH 4 TIMES A DAY  120 capsule  5  . glipiZIDE (GLUCOTROL XL) 10 MG 24 hr tablet Take 1 tablet (10 mg total) by mouth 2 (two) times daily.  180 tablet  3  . glucose blood (ONE TOUCH ULTRA TEST) test strip Use as instructed  100 each  12  . Hydrocodone-APAP-Dietary Prod (HYDROCODONE-APAP-NUTRIT SUPP) 10-325 MG MISC 1 tab by mouth every 6 hrs as  needed for pain  120 each  2  . insulin glargine (LANTUS SOLOSTAR) 100 UNIT/ML injection Inject 10 Units into the skin daily.  5 pen  PRN  . Insulin Pen Needle 31G X 5 MM MISC 1 application by Does not apply route continuous dialysis. Use as directed once daily  100 each  11  . lactulose (CHRONULAC) 10 GM/15ML solution Take 30 mLs (20 g total) by mouth daily.  1892 mL  1  . Lancet Devices (ONE TOUCH SURESOFT) MISC 1 application by Does not apply route 2 (two) times daily.  200 each  11  . metFORMIN (GLUCOPHAGE-XR) 500 MG 24 hr tablet 4 tabs by mouth in the AM  360 tablet  3  . morphine (MS CONTIN) 30 MG 12 hr tablet Take 1 tablet (30 mg total) by mouth every 12 (twelve) hours. To fill September 19, 2011  60 tablet  0  . omeprazole (PRILOSEC) 20 MG capsule Take 1 capsule (20 mg total) by mouth daily.  90 capsule  3  . ondansetron (ZOFRAN) 4 MG tablet Take 4 mg by mouth every 8 (eight) hours as needed. For nausea and vomiting       . venlafaxine (EFFEXOR-XR) 150 MG 24 hr capsule 2 tabs by mouth in the AM  180 capsule  3  . fluticasone (FLONASE) 50 MCG/ACT nasal spray 2 sprays by Nasal route daily.  16 g  2   Review of Systems Review of Systems  Constitutional: Negative for diaphoresis and unexpected weight change.  HENT: Negative for drooling and tinnitus.   Eyes: Negative for photophobia and visual disturbance.  Respiratory: Negative for choking and stridor.   Gastrointestinal: Negative for vomiting and blood in stool.  Genitourinary: Negative for hematuria and decreased urine volume.  Skin: Negative for color change and wound.  Neurological: Negative for worsening tremors and numbness.  Psychiatric/Behavioral: Negative for decreased concentration. The patient is not hyperactive.      Objective:   Physical Exam BP 120/80  Pulse 97  Temp(Src) 100.1 F (37.8 C) (Oral)  Ht 6\' 1"  (1.854 m)  Wt 225 lb 6 oz (102.229 kg)  BMI 29.73 kg/m2  SpO2 94% Physical Exam  VS noted Constitutional: Pt  appears well-developed and well-nourished.  HENT: Head: Normocephalic.  Right Ear: External ear normal.  Left Ear: External ear normal. Pharynx benign, sinus nontender  Eyes: Conjunctivae and EOM are normal. Pupils are equal, round, and reactive to light.  Neck: Normal range of motion. Neck supple.  Cardiovascular: Normal rate and regular rhythm.   Pulmonary/Chest: Effort normal and breath sounds normal.  Abd:  Soft, non-distended, +  BS with mild RLQ tender about the healed recent abd wall abscess site Neurological: Pt is alert. Not confused,  Motor/dtr intact except LLE distal 4+/5, sens intact to LT. Spine: Nontender throughout, no paravertebral swelling or tender, or redness  Skin: Skin is warm. No erythema. No rash Psychiatric: Pt behavior is normal. Thought content normal. mild nervous    Assessment & Plan:

## 2011-08-09 NOTE — Patient Instructions (Signed)
OK to cont off antibx at this time Continue all other medications as before Please have the pharmacy call with any refills you may need. Your EKG was ok today Please go to XRAY in the Basement for the x-ray test Please go to LAB in the Basement for the blood and/or urine tests to be done today You will be contacted by phone if any changes need to be made immediately.  Otherwise, you will receive a letter about your results with an explanation. Please see [facility] now for CT scheduling for today If the CT is neg, we may need to consider MRI for the lumbar spine, and/or echocardiogram

## 2011-08-09 NOTE — Assessment & Plan Note (Signed)
Resolved, ok to follow without further tx at this time

## 2011-08-09 NOTE — Assessment & Plan Note (Signed)
?   Clinical signficance - for CT as above

## 2011-08-09 NOTE — Assessment & Plan Note (Addendum)
?   Worsening again, overall feels worse but exam with little sign of acute except for tender RLQ, pains to the RUQ, mild worsening left lumbar radiculopathy signs (? Coincidental);  ECG reviewed as per emr - no acute, given the mild elev temp after 2 wks doxycycline, MRSA proven, will need CT abd - r/o abscess, blood cx, approp labs includine esr, and cxr to r/o subclinical pna or other.  Since no obvious source, if CT neg, would need to consider stat Lumbar MRI with contast, and even echocardiogram - r/o veg's.  To hold on specific tx at this time until further studies done.    Note:  Total time for pt hx, eval and exam, review of record with pt in the room, determination of diagnoses, and plan for further eval and tx is > 40 min

## 2011-08-12 ENCOUNTER — Other Ambulatory Visit: Payer: Self-pay | Admitting: Internal Medicine

## 2011-08-12 ENCOUNTER — Ambulatory Visit: Payer: Medicare Other | Admitting: Internal Medicine

## 2011-08-12 DIAGNOSIS — M5416 Radiculopathy, lumbar region: Secondary | ICD-10-CM

## 2011-08-14 ENCOUNTER — Other Ambulatory Visit (HOSPITAL_COMMUNITY): Payer: Medicare Other

## 2011-08-20 ENCOUNTER — Other Ambulatory Visit: Payer: Self-pay | Admitting: *Deleted

## 2011-08-20 MED ORDER — CARISOPRODOL 350 MG PO TABS: 350.0000 mg | ORAL_TABLET | Freq: Two times a day (BID) | ORAL | Status: DC | PRN

## 2011-08-20 NOTE — Telephone Encounter (Signed)
Done hardcopy to robin  

## 2011-08-20 NOTE — Telephone Encounter (Signed)
Faxed hardcopy to pharmacy. 

## 2011-08-20 NOTE — Telephone Encounter (Signed)
R'cd fax from [facility] in [place] for refill of Soma-last written 05/21/2011 #[id number] with 2 refills-please advise.

## 2011-10-09 ENCOUNTER — Other Ambulatory Visit: Payer: Self-pay

## 2011-10-09 MED ORDER — AMPHETAMINE-DEXTROAMPHET ER 20 MG PO CP24: 20.0000 mg | ORAL_CAPSULE | ORAL | Status: DC

## 2011-10-09 MED ORDER — MORPHINE SULFATE ER 30 MG PO TBCR: 30.0000 mg | EXTENDED_RELEASE_TABLET | Freq: Two times a day (BID) | ORAL | Status: DC

## 2011-10-09 NOTE — Telephone Encounter (Signed)
Done hardcopy to robin  

## 2011-10-09 NOTE — Telephone Encounter (Signed)
Patient is requesting a refill on Morphine and adderall.  The patient would like his adderall increased from 10 mg to 20 mg ??.  Call [phone] when ready for pickup.  The patient is going on vacation and his wife wanted to make sure he had refills before leaving as not to run out.

## 2011-10-09 NOTE — Telephone Encounter (Signed)
Not sure what happened, will re-do

## 2011-10-09 NOTE — Telephone Encounter (Signed)
I do not have hardcopies

## 2011-10-10 ENCOUNTER — Other Ambulatory Visit: Payer: Self-pay | Admitting: Internal Medicine

## 2011-10-10 NOTE — Telephone Encounter (Signed)
Called the patient informed that prescriptions requested are ready for pickup at the front desk.

## 2011-10-10 NOTE — Telephone Encounter (Signed)
Done erx 

## 2011-10-29 ENCOUNTER — Other Ambulatory Visit: Payer: Self-pay | Admitting: Internal Medicine

## 2011-11-05 ENCOUNTER — Ambulatory Visit (INDEPENDENT_AMBULATORY_CARE_PROVIDER_SITE_OTHER): Payer: Medicare Other | Admitting: Internal Medicine

## 2011-11-05 ENCOUNTER — Encounter: Payer: Self-pay | Admitting: Internal Medicine

## 2011-11-05 ENCOUNTER — Other Ambulatory Visit (INDEPENDENT_AMBULATORY_CARE_PROVIDER_SITE_OTHER): Payer: Medicare Other

## 2011-11-05 VITALS — BP 122/72 | HR 92 | Temp 98.5°F | Ht 73.0 in | Wt 226.1 lb

## 2011-11-05 DIAGNOSIS — E785 Hyperlipidemia, unspecified: Secondary | ICD-10-CM

## 2011-11-05 DIAGNOSIS — E119 Type 2 diabetes mellitus without complications: Secondary | ICD-10-CM

## 2011-11-05 DIAGNOSIS — Z0000 Encounter for general adult medical examination without abnormal findings: Secondary | ICD-10-CM

## 2011-11-05 DIAGNOSIS — IMO0001 Reserved for inherently not codable concepts without codable children: Secondary | ICD-10-CM

## 2011-11-05 DIAGNOSIS — G894 Chronic pain syndrome: Secondary | ICD-10-CM

## 2011-11-05 LAB — BASIC METABOLIC PANEL
GFR: 104.21 mL/min (ref >60.00)
Glucose, Bld: 125 mg/dL — ABNORMAL HIGH (ref 70–99)
Potassium: 4.2 mEq/L (ref 3.5–5.1)
Sodium: 141 mEq/L (ref 135–145)

## 2011-11-05 LAB — LIPID PANEL
Cholesterol: 138 mg/dL (ref 0–200)
HDL: 57.5 mg/dL (ref >39.00)
Triglycerides: 106 mg/dL (ref 0.0–149.0)
VLDL: 21.2 mg/dL (ref 0.0–40.0)

## 2011-11-05 LAB — HEMOGLOBIN A1C: Hgb A1c MFr Bld: 7.6 % — ABNORMAL HIGH (ref 4.6–6.5)

## 2011-11-05 MED ORDER — AMPHETAMINE-DEXTROAMPHET ER 20 MG PO CP24: 20.0000 mg | ORAL_CAPSULE | ORAL | Status: DC

## 2011-11-05 MED ORDER — MORPHINE SULFATE ER 30 MG PO TBCR: 30.0000 mg | EXTENDED_RELEASE_TABLET | Freq: Two times a day (BID) | ORAL | Status: DC

## 2011-11-05 MED ORDER — GABAPENTIN 300 MG PO CAPS: 600.0000 mg | ORAL_CAPSULE | Freq: Three times a day (TID) | ORAL | Status: DC

## 2011-11-05 NOTE — Patient Instructions (Addendum)
Please increase the gabapentin to 600 mg three times per day Continue all other medications as before Your adderall and morphine were refilled today Please have the pharmacy call with any refills you may need. Please go to LAB in the Basement for the blood and/or urine tests to be done today You will be contacted by phone if any changes need to be made immediately.  Otherwise, you will receive a letter about your results with an explanation. Please return in 6 mo with Lab testing done 3-5 days before

## 2011-11-05 NOTE — Assessment & Plan Note (Signed)
stable overall by hx and exam, most recent data reviewed with pt, and pt to continue medical treatment as before Lab Results  Component Value Date   HGBA1C 7.6* 11/05/2011

## 2011-11-05 NOTE — Progress Notes (Signed)
Subjective:    Patient ID: Kurt Reid, male    DOB: 1950/07/05, 61 y.o.   MRN: [id number]  HPI  Here with sister, overall doing ok, for some reason only taking the gabapentin 600 bid, c/o freqeunt breakthrough neuritic LE pain;  Here to f/u; overall doing ok,  Pt denies chest pain, increased sob or doe, wheezing, orthopnea, PND, increased LE swelling, palpitations, dizziness or syncope.  Pt denies new neurological symptoms such as new headache, or facial or extremity weakness or numbness   Pt denies polydipsia, polyuria, or low sugar symptoms such as weakness or confusion improved with po intake.  Pt states overall good compliance with meds, trying to follow lower cholesterol, diabetic diet, wt overall stable but little exercise however.  Pt denies fever, wt loss, night sweats, loss of appetite, or other constitutional symptoms  Of note, did not take [place] after was concerned about possible side effect after reading the information. Past Medical History  Diagnosis Date  . Depression   . Bipolar 1 disorder   . ADHD (attention deficit hyperactivity disorder)   . GERD (gastroesophageal reflux disease)   . Hepatitis C   . Hyperlipidemia   . Low back pain     chronic  . Allergic rhinitis   . Seizure disorder     H/O  . Benign prostatic hypertrophy   . Migraine   . ETOH abuse     hx  . Nephrolithiasis     hx  . Sinusitis     recurrent  . Diverticulosis of colon   . Anxiety   . ADHD 09/23/2006  . ALLERGIC RHINITIS 09/23/2006  . ANXIETY 04/19/2009  . BENIGN PROSTATIC HYPERTROPHY 11/02/2007  . BIPOLAR AFFECTIVE DISORDER 09/23/2006  . Chronic pain syndrome 01/18/2009  . DEPRESSION 09/23/2006  . DIVERTICULOSIS, COLON 04/19/2009  . ESOPHAGEAL VARICES 04/19/2009  . GERD 09/23/2006  . HEPATITIS C 09/23/2006  . HYPERLIPIDEMIA 09/23/2006  . INSOMNIA-SLEEP DISORDER-UNSPEC 04/19/2009  . LOW BACK PAIN 09/23/2006  . NEPHROLITHIASIS, HX OF 11/02/2007  . SEIZURE DISORDER 09/23/2006  . 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 Back surgury     x 5  . Knee surgury     x 1  . Cheekbone     hx of fx  . Tonsillectomy   . S/p sinus surgury 2010    Dr. Zara Chess    reports that he has been smoking.  He has never used smokeless tobacco. He reports that he does not drink alcohol or use illicit drugs. family history includes Asthma in his daughter; Dementia in his father; Depression in his mother; Hyperlipidemia in his mother; Hypertension in his mother; and Stroke in his mother. Allergies  Allergen Reactions  . Duloxetine     REACTION: memory dysfunction, dizziness  . Latex   . Levofloxacin Nausea Only  . Mercury   . Sulfonamide Derivatives    Current Outpatient Prescriptions on File Prior to Visit  Medication Sig Dispense Refill  . aspirin (ASPIRIN EC) 81 MG EC tablet Take 81 mg by mouth daily.        . carisoprodol (SOMA) 350 MG tablet Take 1 tablet (350 mg total) by mouth 2 (two) times daily as needed.  60 tablet  2  . glipiZIDE (GLUCOTROL XL) 10 MG 24 hr tablet Take 1 tablet (10 mg total) by mouth 2 (two) times daily.  180 tablet  3  . glucose blood (ONE TOUCH  ULTRA TEST) test strip Use as instructed  100 each  12  . Hydrocodone-APAP-Dietary Prod (HYDROCODONE-APAP-NUTRIT SUPP) 10-325 MG MISC 1 tab by mouth every 6 hrs as needed for pain  120 each  2  . Insulin Pen Needle 31G X 5 MM MISC 1 application by Does not apply route continuous dialysis. Use as directed once daily  100 each  11  . lactulose (CHRONULAC) 10 GM/15ML solution Take 30 mLs (20 g total) by mouth daily.  1892 mL  1  . Lancet Devices (ONE TOUCH SURESOFT) MISC 1 application by Does not apply route 2 (two) times daily.  200 each  11  . LANTUS SOLOSTAR 100 UNIT/ML injection INJECT 10 UNITS INTO THE SKIN DAILY.  10 mL  11  . metFORMIN (GLUCOPHAGE-XR) 500 MG 24 hr tablet 4 tabs by mouth in the AM  360 tablet  3  . omeprazole (PRILOSEC) 20 MG capsule Take 1 capsule  (20 mg total) by mouth daily.  90 capsule  3  . ondansetron (ZOFRAN) 4 MG tablet Take 4 mg by mouth every 8 (eight) hours as needed. For nausea and vomiting       . venlafaxine (EFFEXOR-XR) 150 MG 24 hr capsule 2 tabs by mouth in the AM  180 capsule  3  . DISCONTD: gabapentin (NEURONTIN) 300 MG capsule Take 2 capsules (600 mg total) by mouth 3 (three) times daily.  180 capsule  5  . DISCONTD: gabapentin (NEURONTIN) 300 MG capsule TAKE ONE CAPSULE BY MOUTH 4 TIMES A DAY  120 capsule  5  . fluticasone (FLONASE) 50 MCG/ACT nasal spray 2 sprays by Nasal route daily.  16 g  2   Review of Systems Review of Systems  Constitutional: Negative for diaphoresis and unexpected weight change.  HENT: Negative for tinnitus.   Eyes: Negative for photophobia and visual disturbance.  Respiratory: Negative for choking and stridor.   Gastrointestinal: Negative for vomiting and blood in stool.  Genitourinary: Negative for hematuria and decreased urine volume.  Musculoskeletal: Negative for gait problem.  Skin: Negative for color change and wound.  Neurological: Negative for tremors and numbness.     Objective:   Physical Exam BP 122/72  Pulse 92  Temp 98.5 F (36.9 C) (Oral)  Ht 6\' 1"  (1.854 m)  Wt 226 lb 2 oz (102.57 kg)  BMI 29.83 kg/m2  SpO2 94% Physical Exam  VS noted, not ill appearing Constitutional: Pt appears well-developed and well-nourished.  HENT: Head: Normocephalic.  Right Ear: External ear normal.  Left Ear: External ear normal.  Eyes: Conjunctivae and EOM are normal. Pupils are equal, round, and reactive to light.  Neck: Normal range of motion. Neck supple.  Cardiovascular: Normal rate and regular rhythm.   Pulmonary/Chest: Effort normal and breath sounds normal.  Neurological: Pt is alert. Not confused Skin: Skin is warm. No erythema. No rash Psychiatric: Pt behavior is normal. Thought content normal.     Assessment & Plan:

## 2011-11-05 NOTE — Assessment & Plan Note (Addendum)
stable overall by hx and exam, and pt to continue medical treatment as before except to increase the gabapentin to 600 tid as originally prescribed

## 2011-11-05 NOTE — Assessment & Plan Note (Signed)
stable overall by hx and exam, most recent data reviewed with pt, and pt to continue medical treatment as before Lab Results  Component Value Date   LDLCALC 59 11/05/2011

## 2011-11-18 ENCOUNTER — Other Ambulatory Visit: Payer: Self-pay

## 2011-11-18 MED ORDER — CARISOPRODOL 350 MG PO TABS: 350.0000 mg | ORAL_TABLET | Freq: Two times a day (BID) | ORAL | Status: DC | PRN

## 2011-11-18 NOTE — Telephone Encounter (Signed)
Faxed hardcopy to pharmacy. 

## 2011-11-18 NOTE — Telephone Encounter (Signed)
Done hardcopy to robin  

## 2011-12-16 ENCOUNTER — Other Ambulatory Visit: Payer: Self-pay | Admitting: Internal Medicine

## 2011-12-17 ENCOUNTER — Other Ambulatory Visit: Payer: Self-pay | Admitting: General Practice

## 2011-12-17 MED ORDER — OMEPRAZOLE 20 MG PO CPDR: 20.0000 mg | DELAYED_RELEASE_CAPSULE | Freq: Every day | ORAL | Status: DC

## 2011-12-17 MED ORDER — HYDROCODONE-APAP-DIETARY PROD 10-325 MG PO MISC: ORAL | Status: DC

## 2011-12-17 NOTE — Telephone Encounter (Signed)
Done hardcopy to robin  

## 2011-12-17 NOTE — Telephone Encounter (Signed)
[facility] 

## 2012-02-11 ENCOUNTER — Other Ambulatory Visit: Payer: Self-pay | Admitting: *Deleted

## 2012-02-11 MED ORDER — MORPHINE SULFATE ER 30 MG PO TBCR: 30.0000 mg | EXTENDED_RELEASE_TABLET | Freq: Two times a day (BID) | ORAL | Status: DC

## 2012-02-11 MED ORDER — AMPHETAMINE-DEXTROAMPHET ER 20 MG PO CP24: 20.0000 mg | ORAL_CAPSULE | ORAL | Status: DC

## 2012-02-11 NOTE — Telephone Encounter (Signed)
Done hardcopy to robin  

## 2012-02-11 NOTE — Telephone Encounter (Signed)
Pt is requesting refill of MS Contin 30mg  and Adderall 20mg -last written on 11/05/2011 for 3 months supply-last fill date was 01/10/2012-please advise on refills.

## 2012-02-12 NOTE — Telephone Encounter (Signed)
Called the patients wife Kurt Reid informed prescriptions are ready for pickup at the front desk.

## 2012-03-02 ENCOUNTER — Other Ambulatory Visit: Payer: Self-pay | Admitting: Internal Medicine

## 2012-03-06 NOTE — Telephone Encounter (Signed)
Soma refill already addressed per Dr Posey Rea dec 30

## 2012-03-06 NOTE — Addendum Note (Signed)
Addended by: [name] on: 03/06/2012 01:18 PM   Modules accepted: Orders

## 2012-03-06 NOTE — Addendum Note (Signed)
Addended by: Scharlene Gloss B on: 03/06/2012 09:17 AM   Modules accepted: Orders

## 2012-03-16 ENCOUNTER — Other Ambulatory Visit: Payer: Self-pay | Admitting: Internal Medicine

## 2012-03-16 NOTE — Telephone Encounter (Signed)
Faxed hardcopy to pharmacy. 

## 2012-03-16 NOTE — Telephone Encounter (Signed)
Done hardcopy to robin  

## 2012-03-25 ENCOUNTER — Telehealth: Payer: Self-pay

## 2012-03-25 MED ORDER — AMPHETAMINE-DEXTROAMPHETAMINE 10 MG PO TABS: 10.0000 mg | ORAL_TABLET | Freq: Two times a day (BID) | ORAL | Status: DC

## 2012-03-25 NOTE — Telephone Encounter (Signed)
Done hardcopy to robin  

## 2012-03-25 NOTE — Telephone Encounter (Signed)
Tedd Sias is requesting a PA on Dextroamphetamine-Amphetamine or offer alternative Methylphenidate or generic adderall IR- Advise Please

## 2012-03-25 NOTE — Telephone Encounter (Signed)
Called and spoke to the patients wife and she informed ok to change

## 2012-03-25 NOTE — Telephone Encounter (Signed)
Called left message to call back 

## 2012-03-25 NOTE — Telephone Encounter (Signed)
i think ok to change to adderall IR 10 twice per day if ok with pt  Robin to let me know

## 2012-03-26 NOTE — Telephone Encounter (Signed)
Called the patients wife informed to pickup hardcopy at the front desk.

## 2012-05-04 ENCOUNTER — Ambulatory Visit: Payer: Medicare Other | Admitting: Internal Medicine

## 2012-05-07 ENCOUNTER — Ambulatory Visit (INDEPENDENT_AMBULATORY_CARE_PROVIDER_SITE_OTHER): Payer: No Typology Code available for payment source | Admitting: Internal Medicine

## 2012-05-07 ENCOUNTER — Other Ambulatory Visit (INDEPENDENT_AMBULATORY_CARE_PROVIDER_SITE_OTHER): Payer: No Typology Code available for payment source

## 2012-05-07 ENCOUNTER — Encounter: Payer: Self-pay | Admitting: Internal Medicine

## 2012-05-07 VITALS — BP 120/80 | HR 84 | Temp 98.7°F | Ht 73.0 in | Wt 227.8 lb

## 2012-05-07 DIAGNOSIS — IMO0001 Reserved for inherently not codable concepts without codable children: Secondary | ICD-10-CM

## 2012-05-07 DIAGNOSIS — Z0000 Encounter for general adult medical examination without abnormal findings: Secondary | ICD-10-CM

## 2012-05-07 LAB — HEPATIC FUNCTION PANEL
Alkaline Phosphatase: 151 U/L — ABNORMAL HIGH (ref 39–117)
Bilirubin, Direct: 0.3 mg/dL (ref 0.0–0.3)

## 2012-05-07 LAB — BASIC METABOLIC PANEL
CO2: 22 mEq/L (ref 19–32)
Calcium: 8.6 mg/dL (ref 8.4–10.5)
GFR: 107.12 mL/min (ref >60.00)
Potassium: 4.5 mEq/L (ref 3.5–5.1)
Sodium: 137 mEq/L (ref 135–145)

## 2012-05-07 LAB — CBC WITH DIFFERENTIAL/PLATELET
Basophils Absolute: 0 10*3/uL (ref 0.0–0.1)
Eosinophils Absolute: 0.1 10*3/uL (ref 0.0–0.7)
HCT: 35.7 % — ABNORMAL LOW (ref 39.0–52.0)
Hemoglobin: 11.9 g/dL — ABNORMAL LOW (ref 13.0–17.0)
Lymphs Abs: 1.8 10*3/uL (ref 0.7–4.0)
MCHC: 33.4 g/dL (ref 30.0–36.0)
Monocytes Absolute: 0.6 10*3/uL (ref 0.1–1.0)
Neutro Abs: 4 10*3/uL (ref 1.4–7.7)
RDW: 15.6 % — ABNORMAL HIGH (ref 11.5–14.6)

## 2012-05-07 LAB — LIPID PANEL
HDL: 46.8 mg/dL (ref >39.00)
Total CHOL/HDL Ratio: 3
VLDL: 21.2 mg/dL (ref 0.0–40.0)

## 2012-05-07 LAB — HEMOGLOBIN A1C: Hgb A1c MFr Bld: 7.7 % — ABNORMAL HIGH (ref 4.6–6.5)

## 2012-05-07 MED ORDER — MORPHINE SULFATE ER 30 MG PO TBCR: 30.0000 mg | EXTENDED_RELEASE_TABLET | Freq: Two times a day (BID) | ORAL | Status: DC

## 2012-05-07 NOTE — Assessment & Plan Note (Signed)

## 2012-05-07 NOTE — Assessment & Plan Note (Signed)
stable overall by history and exam, recent data reviewed with pt, and pt to continue medical treatment as before,  to f/u any worsening symptoms or concerns Lab Results  Component Value Date   HGBA1C 7.6* 11/05/2011

## 2012-05-07 NOTE — Progress Notes (Signed)
Subjective:    Patient ID: Kurt Reid, male    DOB: [birth year], 62 y.o.   MRN: [id number]  HPI  Here for wellness and f/u with wife, needs morphine refill, pain overall doing ok though he has mentioned an increase in med this visit.  Pt denies CP, worsening SOB, DOE, wheezing, orthopnea, PND, worsening LE edema, palpitations, dizziness or syncope.  Pt denies neurological change such as new headache, facial or extremity weakness.  Pt denies polydipsia, polyuria, or low sugar symptoms. Pt states overall good compliance with treatment and medications, good tolerability, and has been trying to follow lower cholesterol diet.  Pt denies worsening depressive symptoms, suicidal ideation or panic. No fever, night sweats, wt loss, loss of appetite, or other constitutional symptoms.  Pt states good ability with ADL's, has low fall risk, home safety reviewed and adequate, no other significant changes in hearing or vision, and only occasionally active with exercise.Did have jan 2014 EGD with GI at [facility] with decreased varices. Past Medical History  Diagnosis Date  . Depression   . Bipolar 1 disorder   . ADHD (attention deficit hyperactivity disorder)   . GERD (gastroesophageal reflux disease)   . Hepatitis C   . Hyperlipidemia   . Low back pain     chronic  . Allergic rhinitis   . Seizure disorder     H/O  . Benign prostatic hypertrophy   . Migraine   . ETOH abuse     hx  . Nephrolithiasis     hx  . Sinusitis     recurrent  . Diverticulosis of colon   . Anxiety   . ADHD 09/23/2006  . ALLERGIC RHINITIS 09/23/2006  . ANXIETY 04/19/2009  . BENIGN PROSTATIC HYPERTROPHY 11/02/2007  . BIPOLAR AFFECTIVE DISORDER 09/23/2006  . Chronic pain syndrome 01/18/2009  . DEPRESSION 09/23/2006  . DIVERTICULOSIS, COLON 04/19/2009  . ESOPHAGEAL VARICES 04/19/2009  . GERD 09/23/2006  . HEPATITIS C 09/23/2006  . HYPERLIPIDEMIA 09/23/2006  . INSOMNIA-SLEEP DISORDER-UNSPEC 04/19/2009  . LOW BACK PAIN 09/23/2006  .  NEPHROLITHIASIS, HX OF 11/02/2007  . SEIZURE DISORDER 09/23/2006  . 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 Back surgury      x 5  . Knee surgury      x 1  . Cheekbone      hx of fx  . Tonsillectomy    . S/p sinus surgury  2010    Dr. Zara Chess    reports that he has been smoking.  He has never used smokeless tobacco. He reports that he does not drink alcohol or use illicit drugs. family history includes Asthma in his daughter; Dementia in his father; Depression in his mother; Hyperlipidemia in his mother; Hypertension in his mother; and Stroke in his mother. Allergies  Allergen Reactions  . Duloxetine     REACTION: memory dysfunction, dizziness  . Latex   . Levofloxacin Nausea Only  . Mercury   . Sulfonamide Derivatives    Current Outpatient Prescriptions on File Prior to Visit  Medication Sig Dispense Refill  . amphetamine-dextroamphetamine (ADDERALL) 10 MG tablet Take 1 tablet (10 mg total) by mouth 2 (two) times daily.  60 tablet  0  . aspirin (ASPIRIN EC) 81 MG EC tablet Take 81 mg by mouth daily.        . carisoprodol (SOMA) 350 MG tablet TAKE 1 TABLET BY MOUTH TWICE A DAY AS NEEDED  60 tablet  2  . gabapentin (NEURONTIN) 300 MG capsule Take 2 capsules (600 mg total) by mouth 3 (three) times daily.  180 capsule  5  . HYDROcodone-acetaminophen (NORCO) 10-325 MG per tablet TAKE 1 TABLET BY MOUTH EVERY 6 HOURS AS NEEDED FOR PAIN  120 tablet  2  . Insulin Pen Needle 31G X 5 MM MISC 1 application by Does not apply route continuous dialysis. Use as directed once daily  100 each  11  . lactulose (CHRONULAC) 10 GM/15ML solution Take 30 mLs (20 g total) by mouth daily.  1892 mL  1  . Lancet Devices (ONE TOUCH SURESOFT) MISC 1 application by Does not apply route 2 (two) times daily.  200 each  11  . LANTUS SOLOSTAR 100 UNIT/ML injection INJECT 10 UNITS INTO THE SKIN DAILY.  10 mL  11  . metFORMIN (GLUCOPHAGE-XR) 500 MG  24 hr tablet 4 tabs by mouth in the AM  360 tablet  3  . omeprazole (PRILOSEC) 20 MG capsule Take 1 capsule (20 mg total) by mouth daily.  90 capsule  3  . ondansetron (ZOFRAN) 4 MG tablet Take 4 mg by mouth every 8 (eight) hours as needed. For nausea and vomiting       . venlafaxine (EFFEXOR-XR) 150 MG 24 hr capsule 2 tabs by mouth in the AM  180 capsule  3  . fluticasone (FLONASE) 50 MCG/ACT nasal spray 2 sprays by Nasal route daily.  16 g  2  . glipiZIDE (GLUCOTROL XL) 10 MG 24 hr tablet Take 1 tablet (10 mg total) by mouth 2 (two) times daily.  180 tablet  3   No current facility-administered medications on file prior to visit.   Review of Systems Constitutional: Negative for diaphoresis, activity change, appetite change or unexpected weight change.  HENT: Negative for hearing loss, ear pain, facial swelling, mouth sores and neck stiffness.   Eyes: Negative for pain, redness and visual disturbance.  Respiratory: Negative for shortness of breath and wheezing.   Cardiovascular: Negative for chest pain and palpitations.  Gastrointestinal: Negative for diarrhea, blood in stool, abdominal distention or other pain Genitourinary: Negative for hematuria, flank pain or change in urine volume.  Musculoskeletal: Negative for myalgias and joint swelling.  Skin: Negative for color change and wound.  Neurological: Negative for syncope and numbness. other than noted Hematological: Negative for adenopathy.  Psychiatric/Behavioral: Negative for hallucinations, self-injury, decreased concentration and agitation.      Objective:   Physical Exam BP 120/80  Pulse 84  Temp(Src) 98.7 F (37.1 C) (Oral)  Ht 6\' 1"  (1.854 m)  Wt 227 lb 12 oz (103.307 kg)  BMI 30.05 kg/m2  SpO2 97% VS noted,  Constitutional: Pt is oriented to person, place, and time. Appears well-developed and well-nourished.  Head: Normocephalic and atraumatic.  Right Ear: External ear normal.  Left Ear: External ear normal.  Nose:  Nose normal.  Mouth/Throat: Oropharynx is clear and moist.  Eyes: Conjunctivae and EOM are normal. Pupils are equal, round, and reactive to light.  Neck: Normal range of motion. Neck supple. No JVD present. No tracheal deviation present.  Cardiovascular: Normal rate, regular rhythm, normal heart sounds and intact distal pulses.   Pulmonary/Chest: Effort normal and breath sounds normal.  Abdominal: Soft. Bowel sounds are normal. There is no tenderness. No HSM  Musculoskeletal: Normal range of motion. Exhibits no edema.  Lymphadenopathy:  Has no cervical adenopathy.  Neurological: Pt is alert and oriented to person, place, and time. Pt has normal reflexes.  No cranial nerve deficit. Motor intact Skin: Skin is warm and dry. No rash noted.  Psychiatric:  1+ nervous    Assessment & Plan:

## 2012-05-07 NOTE — Patient Instructions (Addendum)
Please continue all other medications as before, and refills have been done if requested. Please have the pharmacy call with any other refills you may need. Please go to the LAB in the Basement (turn left off the elevator) for the tests to be done today You will be contacted by phone if any changes need to be made immediately.  Otherwise, you will receive a letter about your results with an explanation, but please check with MyChart first. Thank you for enrolling in MyChart. Please follow the instructions below to securely access your online medical record. MyChart allows you to send messages to your doctor, view your test results, renew your prescriptions, schedule appointments, and more. To Log into My Chart online, please go by [employer] or [employer] to [email or web address], or download the MyChart App from the [employer] of [employer] .  Your Username is: [email or web address] (pass [email or web address]) You are otherwise up to date with prevention measures today. Please continue your efforts at being more active, low cholesterol diet, and weight control. Please keep your appointments with your specialists as you have planned Please return in 6 months, or sooner if needed

## 2012-06-14 ENCOUNTER — Other Ambulatory Visit: Payer: Self-pay | Admitting: Internal Medicine

## 2012-06-15 NOTE — Telephone Encounter (Signed)
Faxed hardcopy to pharmacy. 

## 2012-06-15 NOTE — Telephone Encounter (Signed)
All refills done , soma and norco Done hardcopy to [facility]

## 2012-06-28 ENCOUNTER — Other Ambulatory Visit: Payer: Self-pay | Admitting: Internal Medicine

## 2012-07-11 ENCOUNTER — Other Ambulatory Visit: Payer: Self-pay | Admitting: Internal Medicine

## 2012-07-20 ENCOUNTER — Other Ambulatory Visit: Payer: Self-pay | Admitting: Internal Medicine

## 2012-07-28 ENCOUNTER — Other Ambulatory Visit: Payer: Self-pay

## 2012-07-28 MED ORDER — GABAPENTIN 300 MG PO CAPS: 600.0000 mg | ORAL_CAPSULE | Freq: Three times a day (TID) | ORAL | Status: DC

## 2012-07-28 NOTE — Telephone Encounter (Signed)
Faxed hardcopy to pharmacy. 

## 2012-07-28 NOTE — Telephone Encounter (Signed)
Done hardcopy to robin  

## 2012-07-28 NOTE — Telephone Encounter (Signed)
refill 

## 2012-08-08 ENCOUNTER — Other Ambulatory Visit: Payer: Self-pay | Admitting: Internal Medicine

## 2012-08-11 ENCOUNTER — Telehealth: Payer: Self-pay | Admitting: Internal Medicine

## 2012-08-11 MED ORDER — MORPHINE SULFATE ER 30 MG PO TBCR: 30.0000 mg | EXTENDED_RELEASE_TABLET | Freq: Two times a day (BID) | ORAL | Status: DC

## 2012-08-11 NOTE — Telephone Encounter (Signed)
Called the patients wife left a detailed message that scripts requested are ready for pickup at the front desk.

## 2012-08-11 NOTE — Telephone Encounter (Signed)
Patients wife called triage requesting refill on Morphine.  Please advise

## 2012-08-11 NOTE — Telephone Encounter (Signed)
3mo - Done hardcopy to robin  

## 2012-08-26 ENCOUNTER — Encounter: Payer: Self-pay | Admitting: Internal Medicine

## 2012-08-26 ENCOUNTER — Ambulatory Visit (INDEPENDENT_AMBULATORY_CARE_PROVIDER_SITE_OTHER)
Admission: RE | Admit: 2012-08-26 | Discharge: 2012-08-26 | Disposition: A | Discharge status: Home / Self Care | Payer: No Typology Code available for payment source | Source: Ambulatory Visit | Attending: Internal Medicine | Admitting: Internal Medicine

## 2012-08-26 ENCOUNTER — Ambulatory Visit (INDEPENDENT_AMBULATORY_CARE_PROVIDER_SITE_OTHER): Payer: No Typology Code available for payment source | Admitting: Internal Medicine

## 2012-08-26 VITALS — BP 130/88 | HR 78 | Temp 98.8°F | Ht 73.0 in | Wt 223.4 lb

## 2012-08-26 DIAGNOSIS — J069 Acute upper respiratory infection, unspecified: Secondary | ICD-10-CM

## 2012-08-26 DIAGNOSIS — IMO0001 Reserved for inherently not codable concepts without codable children: Secondary | ICD-10-CM

## 2012-08-26 DIAGNOSIS — H02409 Unspecified ptosis of unspecified eyelid: Secondary | ICD-10-CM

## 2012-08-26 DIAGNOSIS — H02401 Unspecified ptosis of right eyelid: Secondary | ICD-10-CM

## 2012-08-26 MED ORDER — AMOXICILLIN-POT CLAVULANATE 875-125 MG PO TABS: 1.0000 | ORAL_TABLET | Freq: Two times a day (BID) | ORAL | Status: DC

## 2012-08-26 NOTE — Progress Notes (Signed)
Subjective:    Patient ID: Kurt Reid, male    DOB: Feb 24, 1951, 62 y.o.   MRN: [id number]  HPI Here with wife with 3 days onset mild to mod gradually worsening HA, nausea, mildly incresaed confusion over baseline, right eyelid drooping and right max sinus and periorbital pain;  No fever, and Pt denies new neurological symptoms such as other new headache, or facial or extremity weakness or numbness, Pt denies chest pain, increased sob or doe, wheezing, orthopnea, PND, increased LE swelling, palpitations, dizziness or syncope. No chills, ST, cough.  No sick contacts.  Wife wondering about ? Stroke. No blurred vision or vision field changes.  Pt denies polydipsia, polyuria,  Past Medical History  Diagnosis Date  . Depression   . Bipolar 1 disorder   . ADHD (attention deficit hyperactivity disorder)   . GERD (gastroesophageal reflux disease)   . Hepatitis C   . Hyperlipidemia   . Low back pain     chronic  . Allergic rhinitis   . Seizure disorder     H/O  . Benign prostatic hypertrophy   . Migraine   . ETOH abuse     hx  . Nephrolithiasis     hx  . Sinusitis     recurrent  . Diverticulosis of colon   . Anxiety   . ADHD 09/23/2006  . ALLERGIC RHINITIS 09/23/2006  . ANXIETY 04/19/2009  . BENIGN PROSTATIC HYPERTROPHY 11/02/2007  . BIPOLAR AFFECTIVE DISORDER 09/23/2006  . Chronic pain syndrome 01/18/2009  . DEPRESSION 09/23/2006  . DIVERTICULOSIS, COLON 04/19/2009  . ESOPHAGEAL VARICES 04/19/2009  . GERD 09/23/2006  . HEPATITIS C 09/23/2006  . HYPERLIPIDEMIA 09/23/2006  . INSOMNIA-SLEEP DISORDER-UNSPEC 04/19/2009  . LOW BACK PAIN 09/23/2006  . NEPHROLITHIASIS, HX OF 11/02/2007  . SEIZURE DISORDER 09/23/2006  . 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 Back surgury      x 5  . Knee surgury      x 1  . Cheekbone      hx of fx  . Tonsillectomy    . S/p sinus surgury  2010    Dr. Zara Chess    reports that he has been  smoking.  He has never used smokeless tobacco. He reports that he does not drink alcohol or use illicit drugs. family history includes Asthma in his daughter; Dementia in his father; Depression in his mother; Hyperlipidemia in his mother; Hypertension in his mother; and Stroke in his mother. Allergies  Allergen Reactions  . Duloxetine     REACTION: memory dysfunction, dizziness  . Latex   . Levofloxacin Nausea Only  . Mercury   . Sulfonamide Derivatives    Current Outpatient Prescriptions on File Prior to Visit  Medication Sig Dispense Refill  . amphetamine-dextroamphetamine (ADDERALL) 10 MG tablet Take 1 tablet (10 mg total) by mouth 2 (two) times daily.  60 tablet  0  . aspirin (ASPIRIN EC) 81 MG EC tablet Take 81 mg by mouth daily.        . B-D UF III MINI PEN NEEDLES 31G X 5 MM MISC USE AS DIRECTED ONCE DAILY  100 each  2  . carisoprodol (SOMA) 350 MG tablet TAKE 1 TABLET BY MOUTH TWICE A DAY AS NEEDED  60 tablet  2  . gabapentin (NEURONTIN) 300 MG capsule Take 2 capsules (600 mg total) by mouth 3 (three) times daily.  180 capsule  5  . glipiZIDE (GLUCOTROL  XL) 10 MG 24 hr tablet TAKE 1 TABLET BY MOUTH TWICE DAILY  180 tablet  3  . HYDROcodone-acetaminophen (NORCO) 10-325 MG per tablet TAKE 1 TABLET BY MOUTH EVERY 6 HOURS AS NEEDED FOR PAIN  120 tablet  2  . lactulose (CHRONULAC) 10 GM/15ML solution Take 30 mLs (20 g total) by mouth daily.  1892 mL  1  . Lancet Devices (ONE TOUCH SURESOFT) MISC 1 application by Does not apply route 2 (two) times daily.  200 each  11  . LANTUS SOLOSTAR 100 UNIT/ML injection INJECT 10 UNITS INTO THE SKIN DAILY.  10 mL  11  . metFORMIN (GLUCOPHAGE-XR) 500 MG 24 hr tablet TAKE 4 TABLETS BY MOUTH IN THE MORNING  360 tablet  3  . morphine (MS CONTIN) 30 MG 12 hr tablet Take 1 tablet (30 mg total) by mouth 2 (two) times daily. To fill Oct 10, 2012  60 tablet  0  . omeprazole (PRILOSEC) 20 MG capsule Take 1 capsule (20 mg total) by mouth daily.  90 capsule  3  .  ondansetron (ZOFRAN) 4 MG tablet Take 4 mg by mouth every 8 (eight) hours as needed. For nausea and vomiting       . ONE TOUCH ULTRA TEST test strip USE AS DIRECTED  100 each  11  . venlafaxine XR (EFFEXOR-XR) 150 MG 24 hr capsule TAKE 2 CAPSULES BY MOUTH EVERY MORNING  180 capsule  2  . fluticasone (FLONASE) 50 MCG/ACT nasal spray 2 sprays by Nasal route daily.  16 g  2   No current facility-administered medications on file prior to visit.   Review of Systems  Constitutional: Negative for unexpected weight change, or unusual diaphoresis  HENT: Negative for tinnitus.   Eyes: Negative for photophobia and visual disturbance.  Respiratory: Negative for choking and stridor.   Gastrointestinal: Negative for vomiting and blood in stool.  Genitourinary: Negative for hematuria and decreased urine volume.  Musculoskeletal: Negative for acute joint swelling Skin: Negative for color change and wound.  Neurological: Negative for tremors and numbness other than noted  Psychiatric/Behavioral: Negative for decreased concentration or  hyperactivity.       Objective:   Physical Exam BP 130/88  Pulse 78  Temp(Src) 98.8 F (37.1 C) (Oral)  Ht 6\' 1"  (1.854 m)  Wt 223 lb 6 oz (101.322 kg)  BMI 29.48 kg/m2  SpO2 95% VS noted, mild ill Constitutional: Pt appears well-developed and well-nourished.  HENT: Head: NCAT.  Right Ear: External ear normal.  Left Ear: External ear normal.  Bilat tm's with mild erythema.  Max sinus area mild tender on right, no overt periorbital swelling or tenderness.  Pharynx with mild erythema, no exudate Eyes: Conjunctivae and EOM are normal. Pupils are equal, round, and reactive to light.  Neck: Normal range of motion. Neck supple.  Cardiovascular: Normal rate and regular rhythm.   Pulmonary/Chest: Effort normal and breath sounds without rales or wheezing Abd:  Soft, NT, non-distended, + BS Neurological: Pt is alert. Mild increased confusion over baseline, cn 2-12  intact, has ? Blepharitis vs right upper lid ptosis, unable to active raise right lid, EOMI, vision fields intact, unable to apprec discs well Skin: Skin is warm. No erythema.  Psychiatric: Pt behavior is normal.     Assessment & Plan:

## 2012-08-26 NOTE — Assessment & Plan Note (Signed)
stable overall by history and exam, recent data reviewed with pt, and pt to continue medical treatment as before,  to f/u any worsening symptoms or concerns  Lab Results  Component Value Date   HGBA1C 7.7* 05/07/2012

## 2012-08-26 NOTE — Assessment & Plan Note (Signed)
?   Blepharitis vs other, but given HA, confusion and overt right lid abnormality given acute stage will need Head CT - r/o cns bleed, consider MRI or neuro f/u if persists, worsens

## 2012-08-26 NOTE — Patient Instructions (Signed)
Please take all new medication as prescribed - the antibiotic Please continue all other medications as before, and refills have been done if requested. You will be contacted regarding the referral for: CT head - rule out stroke and problem around the right eye  Please remember to sign up for My Chart if you have not done so, as this will be important to you in the future with finding out test results, communicating by private email, and scheduling acute appointments online when needed.

## 2012-08-26 NOTE — Assessment & Plan Note (Addendum)
?   Sinusitis, much less likely endophalmitis,,Mild to mod, for antibx course,  to f/u any worsening symptoms or concerns

## 2012-09-03 ENCOUNTER — Other Ambulatory Visit: Payer: Self-pay

## 2012-09-03 MED ORDER — GABAPENTIN 300 MG PO CAPS: 600.0000 mg | ORAL_CAPSULE | Freq: Three times a day (TID) | ORAL | Status: DC

## 2012-09-08 ENCOUNTER — Other Ambulatory Visit: Payer: Self-pay | Admitting: Internal Medicine

## 2012-09-09 NOTE — Telephone Encounter (Signed)
Done hardcopy to robin  

## 2012-09-09 NOTE — Telephone Encounter (Signed)
Faxed hardcopy to [facility] [place] 

## 2012-11-09 ENCOUNTER — Ambulatory Visit: Payer: No Typology Code available for payment source | Admitting: Internal Medicine

## 2012-11-10 ENCOUNTER — Other Ambulatory Visit (INDEPENDENT_AMBULATORY_CARE_PROVIDER_SITE_OTHER): Payer: No Typology Code available for payment source

## 2012-11-10 ENCOUNTER — Encounter: Payer: Self-pay | Admitting: Internal Medicine

## 2012-11-10 ENCOUNTER — Ambulatory Visit (INDEPENDENT_AMBULATORY_CARE_PROVIDER_SITE_OTHER): Payer: No Typology Code available for payment source | Admitting: Internal Medicine

## 2012-11-10 VITALS — BP 120/80 | HR 84 | Temp 98.8°F | Ht 74.0 in | Wt 218.5 lb

## 2012-11-10 DIAGNOSIS — E785 Hyperlipidemia, unspecified: Secondary | ICD-10-CM

## 2012-11-10 DIAGNOSIS — Z23 Encounter for immunization: Secondary | ICD-10-CM

## 2012-11-10 DIAGNOSIS — F329 Major depressive disorder, single episode, unspecified: Secondary | ICD-10-CM

## 2012-11-10 DIAGNOSIS — G894 Chronic pain syndrome: Secondary | ICD-10-CM

## 2012-11-10 MED ORDER — MORPHINE SULFATE ER 30 MG PO TBCR: 30.0000 mg | EXTENDED_RELEASE_TABLET | Freq: Two times a day (BID) | ORAL | Status: DC

## 2012-11-10 MED ORDER — ONETOUCH SURESOFT LANCING DEV MISC: Status: AC

## 2012-11-10 MED ORDER — ONETOUCH ULTRA 2 W/DEVICE KIT: 1.0000 "application " | PACK | Freq: Every day | Status: DC

## 2012-11-10 MED ORDER — GLUCOSE BLOOD VI STRP: ORAL_STRIP | Status: DC

## 2012-11-10 NOTE — Progress Notes (Signed)
Subjective:    Patient ID: Kurt Reid, male    DOB: 02/02/1951, 62 y.o.   MRN: [id number]  HPI Here to f/u; overall doing ok,  Pt denies chest pain, increased sob or doe, wheezing, orthopnea, PND, increased LE swelling, palpitations, dizziness or syncope.  Pt denies polydipsia, polyuria, or low sugar symptoms such as weakness or confusion improved with po intake.  Pt denies new neurological symptoms such as new headache, or facial or extremity weakness or numbness.   Pt states overall good compliance with meds, has been trying to follow lower cholesterol, diabetic diet, with wt overall stable,  but little exercise however.  Chronic LBP stable, needs refills.  Has seen GI at [facility], to begin Hep C tx approx oct 2014.  No new complaints.  Denies worsening depressive symptoms, suicidal ideation, or panic  Accepts mult shots today Past Medical History  Diagnosis Date  . Depression   . Bipolar 1 disorder   . ADHD (attention deficit hyperactivity disorder)   . GERD (gastroesophageal reflux disease)   . Hepatitis C   . Hyperlipidemia   . Low back pain     chronic  . Allergic rhinitis   . Seizure disorder     H/O  . Benign prostatic hypertrophy   . Migraine   . ETOH abuse     hx  . Nephrolithiasis     hx  . Sinusitis     recurrent  . Diverticulosis of colon   . Anxiety   . ADHD 09/23/2006  . ALLERGIC RHINITIS 09/23/2006  . ANXIETY 04/19/2009  . BENIGN PROSTATIC HYPERTROPHY 11/02/2007  . BIPOLAR AFFECTIVE DISORDER 09/23/2006  . Chronic pain syndrome 01/18/2009  . DEPRESSION 09/23/2006  . DIVERTICULOSIS, COLON 04/19/2009  . ESOPHAGEAL VARICES 04/19/2009  . GERD 09/23/2006  . HEPATITIS C 09/23/2006  . HYPERLIPIDEMIA 09/23/2006  . INSOMNIA-SLEEP DISORDER-UNSPEC 04/19/2009  . LOW BACK PAIN 09/23/2006  . NEPHROLITHIASIS, HX OF 11/02/2007  . SEIZURE DISORDER 09/23/2006  . 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 Back surgury      x 5   . Knee surgury      x 1  . Cheekbone      hx of fx  . Tonsillectomy    . S/p sinus surgury  2010    Dr. Zara Chess    reports that he has been smoking.  He has never used smokeless tobacco. He reports that he does not drink alcohol or use illicit drugs. family history includes Asthma in his daughter; Dementia in his father; Depression in his mother; Hyperlipidemia in his mother; Hypertension in his mother; Stroke in his mother. Allergies  Allergen Reactions  . Duloxetine     REACTION: memory dysfunction, dizziness  . Latex   . Levofloxacin Nausea Only  . Mercury   . Sulfonamide Derivatives    Current Outpatient Prescriptions on File Prior to Visit  Medication Sig Dispense Refill  . amphetamine-dextroamphetamine (ADDERALL) 10 MG tablet Take 1 tablet (10 mg total) by mouth 2 (two) times daily.  60 tablet  0  . aspirin (ASPIRIN EC) 81 MG EC tablet Take 81 mg by mouth daily.        . B-D UF III MINI PEN NEEDLES 31G X 5 MM MISC USE AS DIRECTED ONCE DAILY  100 each  2  . carisoprodol (SOMA) 350 MG tablet TAKE 1 TABLET BY MOUTH TWICE A DAY AS NEEDED  60 tablet  2  . gabapentin (NEURONTIN) 300 MG capsule Take 2 capsules (600 mg total) by mouth 3 (three) times daily.  180 capsule  5  . glipiZIDE (GLUCOTROL XL) 10 MG 24 hr tablet TAKE 1 TABLET BY MOUTH TWICE DAILY  180 tablet  3  . HYDROcodone-acetaminophen (NORCO) 10-325 MG per tablet TAKE 1 TABLET BY MOUTH EVERY 6 HOURS AS NEEDED FOR PAIN  120 tablet  2  . lactulose (CHRONULAC) 10 GM/15ML solution Take 30 mLs (20 g total) by mouth daily.  1892 mL  1  . LANTUS SOLOSTAR 100 UNIT/ML injection INJECT 10 UNITS INTO THE SKIN DAILY.  10 mL  11  . metFORMIN (GLUCOPHAGE-XR) 500 MG 24 hr tablet TAKE 4 TABLETS BY MOUTH IN THE MORNING  360 tablet  3  . omeprazole (PRILOSEC) 20 MG capsule Take 1 capsule (20 mg total) by mouth daily.  90 capsule  3  . ondansetron (ZOFRAN) 4 MG tablet Take 4 mg by mouth every 8 (eight) hours as needed. For nausea and  vomiting       . venlafaxine XR (EFFEXOR-XR) 150 MG 24 hr capsule TAKE 2 CAPSULES BY MOUTH EVERY MORNING  180 capsule  2  . fluticasone (FLONASE) 50 MCG/ACT nasal spray 2 sprays by Nasal route daily.  16 g  2   No current facility-administered medications on file prior to visit.   Review of Systems  Constitutional: Negative for unexpected weight change, or unusual diaphoresis  HENT: Negative for tinnitus.   Eyes: Negative for photophobia and visual disturbance.  Respiratory: Negative for choking and stridor.   Gastrointestinal: Negative for vomiting and blood in stool.  Genitourinary: Negative for hematuria and decreased urine volume.  Musculoskeletal: Negative for acute joint swelling Skin: Negative for color change and wound.  Neurological: Negative for tremors and numbness other than noted  Psychiatric/Behavioral: Negative for decreased concentration or  hyperactivity.       Objective:   Physical Exam BP 120/80  Pulse 84  Temp(Src) 98.8 F (37.1 C) (Oral)  Ht 6\' 2"  (1.88 m)  Wt 218 lb 8 oz (99.111 kg)  BMI 28.04 kg/m2  SpO2 95% VS noted, not ill appearing Constitutional: Pt appears well-developed and well-nourished.  HENT: Head: NCAT.  Right Ear: External ear normal.  Left Ear: External ear normal.  Eyes: Conjunctivae and EOM are normal. Pupils are equal, round, and reactive to light.  Neck: Normal range of motion. Neck supple.  Cardiovascular: Normal rate and regular rhythm.   Pulmonary/Chest: Effort normal and breath sounds normal.  Abd:  Soft, NT, non-distended, + BS Spine nontender Neurological: Pt is alert. Not confused  Skin: Skin is warm. No erythema.  Psychiatric: Pt behavior is normal. Thought content normal. mild nervous, not depressed affect    Assessment & Plan:

## 2012-11-10 NOTE — Assessment & Plan Note (Signed)
stable overall by history and exam, recent data reviewed with pt, and pt to continue medical treatment as before,  to f/u any worsening symptoms or concerns Lab Results  Component Value Date   LDLCALC 63 05/07/2012

## 2012-11-10 NOTE — Assessment & Plan Note (Signed)
stable overall by history and exam, recent data reviewed with pt, and pt to continue medical treatment as before,  to f/u any worsening symptoms or concerns Lab Results  Component Value Date   WBC 6.5 05/07/2012   HGB 11.9* 05/07/2012   HCT 35.7* 05/07/2012   PLT 142.0* 05/07/2012   GLUCOSE 222* 05/07/2012   CHOL 131 05/07/2012   TRIG 106.0 05/07/2012   HDL 46.80 05/07/2012   LDLDIRECT 81.5 11/02/2007   LDLCALC 63 05/07/2012   ALT 24 05/07/2012   AST 37 05/07/2012   NA 137 05/07/2012   K 4.5 05/07/2012   CL 108 05/07/2012   CREATININE 0.8 05/07/2012   BUN 9 05/07/2012   CO2 22 05/07/2012   TSH 1.76 05/07/2012   PSA 0.03* 05/07/2012   HGBA1C 7.7* 05/07/2012

## 2012-11-10 NOTE — Assessment & Plan Note (Addendum)
Mild elev a1c last visit, has been trying to follow better diet, cont same tx, for lab f/u Lab Results  Component Value Date   HGBA1C 7.7* 05/07/2012

## 2012-11-10 NOTE — Addendum Note (Signed)
Addended by: Scharlene Gloss B on: 11/10/2012 05:40 PM   Modules accepted: Orders

## 2012-11-10 NOTE — Assessment & Plan Note (Signed)
Stable, for pain med refill °

## 2012-11-10 NOTE — Patient Instructions (Addendum)
You had the flu shot, pneumonia shot, and tetanus shot today  Please continue all other medications as before, and refills have been done if requested, including the morphine, and the onetouch ultra 2 glucometer and strips  Please go to the LAB in the Basement (turn left off the elevator) for the tests to be done today You will be contacted by phone if any changes need to be made immediately.  Otherwise, you will receive a letter about your results with an explanation, but please check with MyChart first.  Please remember to sign up for My Chart if you have not done so, as this will be important to you in the future with finding out test results, communicating by private email, and scheduling acute appointments online when needed.  Please keep your appointments with your specialists as you have planned - the [facility] physicians  Please return in 6 months, or sooner if needed, with Lab testing done 3-5 days before

## 2012-11-11 ENCOUNTER — Telehealth: Payer: Self-pay | Admitting: Internal Medicine

## 2012-11-11 LAB — BASIC METABOLIC PANEL
CO2: 28 mEq/L (ref 19–32)
Chloride: 105 mEq/L (ref 96–112)
Potassium: 4.3 mEq/L (ref 3.5–5.1)
Sodium: 138 mEq/L (ref 135–145)

## 2012-11-11 LAB — LIPID PANEL: Total CHOL/HDL Ratio: 3

## 2012-11-11 LAB — HEPATIC FUNCTION PANEL
ALT: 22 U/L (ref 0–53)
Alkaline Phosphatase: 155 U/L — ABNORMAL HIGH (ref 39–117)
Bilirubin, Direct: 0.5 mg/dL — ABNORMAL HIGH (ref 0.0–0.3)
Total Bilirubin: 1.6 mg/dL — ABNORMAL HIGH (ref 0.3–1.2)
Total Protein: 7.3 g/dL (ref 6.0–8.3)

## 2012-11-11 NOTE — Telephone Encounter (Signed)
Ok to cont meds as is, to re-start DM diet, will refer to DM education

## 2012-11-11 NOTE — Telephone Encounter (Signed)
Message copied by [name] on Wed Nov 11, 2012  7:47 PM ------      Message from: Scharlene Gloss B      Created: Wed Nov 11, 2012  2:07 PM       Called the patient to inform of results and will call his wife as well to inform.  The patient stated he has not been watching his diet and may have missed a few days of medication.  He did agree to referral for DM education ------

## 2012-11-12 NOTE — Telephone Encounter (Signed)
Patient and wife aware.

## 2012-12-07 ENCOUNTER — Other Ambulatory Visit: Payer: Self-pay | Admitting: Internal Medicine

## 2012-12-07 NOTE — Telephone Encounter (Signed)
Pt called requesting hydrocodone refill.  Please advise 

## 2012-12-07 NOTE — Telephone Encounter (Signed)
Done hardcopy to robin  

## 2012-12-08 NOTE — Telephone Encounter (Signed)
Refill, faxed hardcopcy to [facility] 

## 2012-12-11 ENCOUNTER — Telehealth: Payer: Self-pay | Admitting: *Deleted

## 2012-12-11 MED ORDER — HYDROCODONE-ACETAMINOPHEN 10-325 MG PO TABS: ORAL_TABLET | ORAL | Status: DC

## 2012-12-11 NOTE — Telephone Encounter (Signed)
Called the patient informed his wife Kurt Reid) by detailed message and called the home number informed as well hardcopy's are ready for pickup at the front desk.

## 2012-12-11 NOTE — Telephone Encounter (Signed)
Done hardcopy to robin x 3 mo

## 2012-12-11 NOTE — Telephone Encounter (Signed)
Pt called requesting hydrocodone refill

## 2012-12-23 ENCOUNTER — Ambulatory Visit: Payer: No Typology Code available for payment source | Admitting: *Deleted

## 2012-12-25 ENCOUNTER — Other Ambulatory Visit: Payer: Self-pay | Admitting: Internal Medicine

## 2013-01-22 ENCOUNTER — Encounter: Payer: Medicare Other | Attending: Internal Medicine | Admitting: *Deleted

## 2013-01-22 ENCOUNTER — Encounter: Payer: Self-pay | Admitting: *Deleted

## 2013-01-22 VITALS — Ht 73.0 in | Wt 216.1 lb

## 2013-01-22 DIAGNOSIS — Z713 Dietary counseling and surveillance: Secondary | ICD-10-CM | POA: Insufficient documentation

## 2013-01-22 DIAGNOSIS — IMO0001 Reserved for inherently not codable concepts without codable children: Secondary | ICD-10-CM | POA: Insufficient documentation

## 2013-01-22 NOTE — Progress Notes (Signed)
Appt start time: 0945 end time:  1115.  Assessment:  Patient was seen on  01/22/13 for individual diabetes education. Patient here with his wife, Almyra Free, who prepares his meals. He answers questions but expresses memory loss and turns to his wife to confirm his answers often. She waits for him to answer before assisting him or providing correct answers when he can't remember. They state history of Diabetes  For about the past year. He is lacking teeth so all foods are soft consistency right now. Many choices are high in sugar, including his beverages. No structured activity in his life at this time. He complains of difficulty getting an adequate supply of blood for checking his BG so he doesn't test often stating maybe once a month.  Current HbA1c: 7.7% on 05/17/12, then 10.5% on 11/10/12  Preferred Learning Style:   Auditory  Visual   Learning Readiness:   Not ready or perhaps Contemplating  MEDICATIONS: see list, Diabetes medications include Lantus, Metformin and Glipizide  DIETARY INTAKE:  24-hr recall:  B ( AM): sweet cereal with whole milk, OR flavored oatmeal x 4 pkgs, regular Coke 12 oz can Snk ( AM): none  L ( PM): cereal again OR yogurt with fruit OR  Snk ( PM): cereal OR cheese crackers OR cookies, regular coke D ( PM): same as breakfast and lunch, also a pint of ice cream out of the container OR occasionally green beans or mac and cheese Snk ( PM): occasionally  Beverages: regular soda  Usual physical activity: not much, walks dogs around the house  Estimated energy needs: 2000 calories 225 g carbohydrates 150 g protein 56 g fat   Intervention:  Nutrition counseling provided.  Discussed diabetes disease process and treatment options in basic terms.    Provided education on macronutrients on glucose levels.  Provided education on carb counting, importance of regularly scheduled meals/snacks, and meal planning. Suggested he consider Regular vs. Flavored oatmeal and that  he try different varieties of diet sodas. Also suggested a modified amount of ice cream each day which he agreed to.  Blood glucose monitoring and interpretation.  Discussed recommended ways to prick finger for better sample of blood.  Provided information on:  Role of medications and diet in glucose control  Effects of physical activity on glucose levels and long-term glucose control.  Recommended 150 minutes of physical activity/week.  Patient medications.  Discussed role of medication on blood glucose and possible side effects   Short-term complications: hyper- and hypo-glycemia.  Discussed causes,symptoms, and treatment options.  Prevention, detection, and treatment of long-term complications.  Discussed the role of prolonged elevated glucose levels on body systems.  Role of stress on blood glucose levels and discussed strategies to manage psychosocial issues.  Recommendations for long-term diabetes self-care.  Established checklist for medical, dental, and emotional self-care.  Plan: Switch from flavored to regular oatmeal and add fruit of your choice to it. Try some different diet sodas to see if you can find one you like.  Consider labeling the pints of ice cream by day of the week so you can control your intake to 1 pint a day.  Teaching Method Utilized: the following: Visual Auditory  Handouts given during visit include: Living Well with Diabetes Carb Counting and Food Label handouts Meal Plan Card  Barriers to learning/adherence to lifestyle change: memory loss and ADHD  Diabetes self-care support plan:   [facility] support group available  Demonstrated degree of understanding via:  Teach Back   Monitoring/Evaluation:  Dietary intake, exercise, reading food labels, and body weight in 6 week(s).

## 2013-01-22 NOTE — Patient Instructions (Signed)
Plan: Switch from flavored to regular oatmeal and add fruit of your choice to it. Try some different diet sodas to see if you can find one you like.  Consider labeling the pints of ice cream by day of the week so you can control your intake to 1 pint a day.

## 2013-02-05 ENCOUNTER — Telehealth: Payer: Self-pay | Admitting: *Deleted

## 2013-02-05 MED ORDER — MORPHINE SULFATE ER 30 MG PO TBCR: 30.0000 mg | EXTENDED_RELEASE_TABLET | Freq: Two times a day (BID) | ORAL | Status: DC

## 2013-02-05 NOTE — Telephone Encounter (Signed)
pts wife called requesting MS Contin refill x3 months.  Please advise

## 2013-02-05 NOTE — Telephone Encounter (Signed)
Called the patient informed hardcopy's are ready for pickup at the front desk.  Also informed letter enclosed explaining pain medication refill policy.  Informed of all information to the patients wife.

## 2013-02-05 NOTE — Telephone Encounter (Signed)
Done hardcopy to robin but also to let pt know  You are given the letter today explaining the transitional pain medication refill policy due to recent change [place] law, and [place] [facility] Board regulations  Please be aware that I will no longer be able to offer monthly refills of any Schedule II or higher medication starting Apr 04, 2013

## 2013-02-28 ENCOUNTER — Other Ambulatory Visit: Payer: Self-pay | Admitting: Internal Medicine

## 2013-03-09 ENCOUNTER — Telehealth: Payer: Self-pay | Admitting: *Deleted

## 2013-03-09 DIAGNOSIS — G8929 Other chronic pain: Secondary | ICD-10-CM

## 2013-03-09 NOTE — Telephone Encounter (Signed)
Pt already has hardcopy rx for jan and feb 2015 for ms contin according to EMR records  Please [facility] for assist with referral process

## 2013-03-09 NOTE — Telephone Encounter (Signed)
Spouse phoned requesting refill for patient's Vicodin (for two months) and also inquiring pain management referral status...  Requests that whomever referral is, that they are located in [place], as [place].  Callback number is [phone]

## 2013-03-10 NOTE — Telephone Encounter (Signed)
Spoke with Lanora ManisElizabeth.  She states they do have the MS Contin Rx however they also need the Vicodin Rx for Jan and Feb.

## 2013-03-10 NOTE — Telephone Encounter (Signed)
There is no Pain management referral in EMR.  Please enter referral.

## 2013-03-10 NOTE — Telephone Encounter (Signed)
Referral done

## 2013-03-11 ENCOUNTER — Telehealth: Payer: Self-pay | Admitting: *Deleted

## 2013-03-11 MED ORDER — HYDROCODONE-ACETAMINOPHEN 10-325 MG PO TABS: ORAL_TABLET | ORAL | Status: DC

## 2013-03-11 NOTE — Telephone Encounter (Signed)
Pt also should have MS contin rx for jan and feb given already in hardcopy  [facility] Done hardcopy to robin/staff

## 2013-03-11 NOTE — Telephone Encounter (Signed)
Spouse phoned again stating that she had the script for the MS Contin, but not the vicodin.  States patient is out.  Please advise.  No order on file for vicodin, only norco. Spouse corrected herself, she mistook norco for vicodin.  She is requesting refill for January and February for his Norco.  Please advise.  CB# [phone]

## 2013-03-12 NOTE — Telephone Encounter (Signed)
Notified wife that pt's scripts were available for pick up

## 2013-03-19 ENCOUNTER — Other Ambulatory Visit: Payer: Self-pay | Admitting: Internal Medicine

## 2013-03-23 ENCOUNTER — Telehealth: Payer: Self-pay

## 2013-03-23 NOTE — Telephone Encounter (Signed)
Kurt Reid the patients wife called with concern over the fact they have not gotten a referral to pain management yet.  Stated they are aware Refills will stop as of Feb. 1.  Advise please.

## 2013-03-23 NOTE — Telephone Encounter (Signed)
Last rx should be dated mar 9  [facility]

## 2013-03-24 MED ORDER — MORPHINE SULFATE ER 30 MG PO TBCR: 30.0000 mg | EXTENDED_RELEASE_TABLET | Freq: Two times a day (BID) | ORAL | Status: DC

## 2013-03-24 NOTE — Telephone Encounter (Signed)
The wife called back to inform he has one morphine prescription dated Feb.7 and 2 hydrocodone's, one dated 04/10/13, the other 05/09/13.  Also they would [place] the pain management [place].   The wife wanted the PCP to be aware the patient has been approved for Hep. C treatment.

## 2013-03-24 NOTE — Telephone Encounter (Signed)
Called Almyra FreeLibby the patients wife (left detailed message) informed of MD instructions.

## 2013-03-24 NOTE — Addendum Note (Signed)
Addended by: Corwin LevinsJOHN, Dian Minahan W on: 03/24/2013 02:11 PM   Modules accepted: Orders

## 2013-03-24 NOTE — Telephone Encounter (Signed)
Ok for one more morphine for early mar to match his hydrocodone rx  - Done hardcopy to robin  OK to forward other info to [facility]

## 2013-03-24 NOTE — Telephone Encounter (Signed)
Called Almyra FreeLibby the patients wife informed hardcopy is ready for pickup at the front desk.  Also informed of referral request done.

## 2013-04-08 ENCOUNTER — Other Ambulatory Visit: Payer: Self-pay | Admitting: Internal Medicine

## 2013-04-09 ENCOUNTER — Encounter: Payer: Self-pay | Admitting: Internal Medicine

## 2013-04-13 ENCOUNTER — Encounter: Payer: Self-pay | Admitting: Physician Assistant

## 2013-04-13 ENCOUNTER — Other Ambulatory Visit: Payer: Self-pay

## 2013-04-13 ENCOUNTER — Ambulatory Visit (INDEPENDENT_AMBULATORY_CARE_PROVIDER_SITE_OTHER): Payer: Medicare Other | Admitting: Physician Assistant

## 2013-04-13 VITALS — BP 132/72 | HR 91 | Temp 98.7°F | Wt 214.4 lb

## 2013-04-13 DIAGNOSIS — J209 Acute bronchitis, unspecified: Secondary | ICD-10-CM

## 2013-04-13 DIAGNOSIS — J329 Chronic sinusitis, unspecified: Secondary | ICD-10-CM

## 2013-04-13 MED ORDER — ONDANSETRON HCL 4 MG PO TABS: 4.0000 mg | ORAL_TABLET | Freq: Three times a day (TID) | ORAL | Status: DC | PRN

## 2013-04-13 MED ORDER — LACTULOSE 10 GM/15ML PO SOLN: 20.0000 g | Freq: Every day | ORAL | Status: DC

## 2013-04-13 MED ORDER — AMOXICILLIN-POT CLAVULANATE 875-125 MG PO TABS: 1.0000 | ORAL_TABLET | Freq: Two times a day (BID) | ORAL | Status: DC

## 2013-04-13 MED ORDER — INSULIN GLARGINE 100 UNIT/ML SOLOSTAR PEN: PEN_INJECTOR | SUBCUTANEOUS | Status: DC

## 2013-04-13 NOTE — Progress Notes (Signed)
Subjective:    Patient ID: Kurt Reid, male    DOB: 1950-09-13, 63 y.o.   MRN: [id number]  Cough This is a new problem. The current episode started in the past 7 days. The problem has been gradually worsening. The problem occurs constantly. The cough is productive of purulent sputum. Associated symptoms include chills, a fever (subjective), postnasal drip and a sore throat. Pertinent negatives include no ear pain, rash, shortness of breath or wheezing. Treatments tried: delsym with mild relief of symptoms. The treatment provided mild relief.  Sore Throat  Associated symptoms include coughing (productive of green mucus). Pertinent negatives include no ear pain, shortness of breath, trouble swallowing or vomiting.  Fever  Associated symptoms include coughing (productive of green mucus), nausea and a sore throat. Pertinent negatives include no ear pain, rash, vomiting or wheezing.      Review of Systems  Constitutional: Positive for fever (subjective), chills and fatigue.  HENT: Positive for postnasal drip, sinus pressure and sore throat. Negative for ear pain and trouble swallowing.   Respiratory: Positive for cough (productive of green mucus). Negative for shortness of breath and wheezing.   Gastrointestinal: Positive for nausea. Negative for vomiting and constipation.  Skin: Negative for rash.  Neurological: Positive for light-headedness. Negative for dizziness.  All other systems reviewed and are negative.   Past Medical History  Diagnosis Date  . Depression   . Bipolar 1 disorder   . ADHD (attention deficit hyperactivity disorder)   . GERD (gastroesophageal reflux disease)   . Hepatitis C   . Hyperlipidemia   . Low back pain     chronic  . Allergic rhinitis   . Seizure disorder     H/O  . Benign prostatic hypertrophy   . Migraine   . ETOH abuse     hx  . Nephrolithiasis     hx  . Sinusitis     recurrent  . Diverticulosis of colon   . Anxiety   . ADHD 09/23/2006   . ALLERGIC RHINITIS 09/23/2006  . ANXIETY 04/19/2009  . BENIGN PROSTATIC HYPERTROPHY 11/02/2007  . BIPOLAR AFFECTIVE DISORDER 09/23/2006  . Chronic pain syndrome 01/18/2009  . DEPRESSION 09/23/2006  . DIVERTICULOSIS, COLON 04/19/2009  . ESOPHAGEAL VARICES 04/19/2009  . GERD 09/23/2006  . HEPATITIS C 09/23/2006  . HYPERLIPIDEMIA 09/23/2006  . INSOMNIA-SLEEP DISORDER-UNSPEC 04/19/2009  . LOW BACK PAIN 09/23/2006  . NEPHROLITHIASIS, HX OF 11/02/2007  . SEIZURE DISORDER 09/23/2006  . 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 Diabetes mellitus without complication        Objective:   Physical Exam  Vitals reviewed. Constitutional: He is oriented to person, place, and time. Vital signs are normal. He appears well-developed and well-nourished. He does not appear ill. No distress.  HENT:  Head: Normocephalic and atraumatic.  Right Ear: Hearing, tympanic membrane, external ear and ear canal normal. No middle ear effusion.  Left Ear: Hearing, tympanic membrane, external ear and ear canal normal.  No middle ear effusion.  Nose: Rhinorrhea present. Right sinus exhibits maxillary sinus tenderness. Right sinus exhibits no frontal sinus tenderness. Left sinus exhibits maxillary sinus tenderness. Left sinus exhibits no frontal sinus tenderness.  Mouth/Throat: Uvula is midline. No oropharyngeal exudate.  PND noted  Eyes: Conjunctivae are normal. Pupils are equal, round, and reactive to light. Right eye exhibits no discharge. Left eye exhibits no discharge. No scleral icterus.  Neck: No JVD present.  Cardiovascular: Normal rate, regular rhythm, normal heart sounds and intact distal  pulses.  Exam reveals no gallop and no friction rub.   No murmur heard. Pulmonary/Chest: Effort normal and breath sounds normal. No stridor. No respiratory distress. He has no wheezes. He has no rhonchi. He has no rales.  Abdominal: Soft. There is no tenderness.  Lymphadenopathy:    He has no cervical adenopathy.    Neurological: He is alert and oriented to person, place, and time. No cranial nerve deficit.  Skin: Skin is warm and dry.  Psychiatric: He has a normal mood and affect. His behavior is normal. Judgment and thought content normal.   Filed Vitals:   04/13/13 1408  BP: 132/72  Pulse: 91  Temp: 98.7 F (37.1 C)       Assessment & Plan:    Sinusitis/bronchitis Rx for Augmentin Can continue use of Mucinex and Delsym Patient instructed to RTO if no improvement of symptoms.

## 2013-04-13 NOTE — Patient Instructions (Signed)
It was great meeting you today [name]!  Sinusitis Sinusitis is redness, soreness, and puffiness (inflammation) of the air pockets in the bones of your face (sinuses). The redness, soreness, and puffiness can cause air and mucus to get trapped in your sinuses. This can allow germs to grow and cause an infection.  HOME CARE   Drink enough fluids to keep your pee (urine) clear or pale yellow.  Use a humidifier in your home.  Run a hot shower to create steam in the bathroom. Sit in the bathroom with the door closed. Breathe in the steam 3 4 times a day.  Put a warm, moist washcloth on your face 3 4 times a day, or as told by your doctor.  Use salt water sprays (saline sprays) to wet the thick fluid in your nose. This can help the sinuses drain.  Only take medicine as told by your doctor. GET HELP RIGHT AWAY IF:   Your pain gets worse.  You have very bad headaches.  You are sick to your stomach (nauseous).  You throw up (vomit).  You are very sleepy (drowsy) all the time.  Your face is puffy (swollen).  Your vision changes.  You have a stiff neck.  You have trouble breathing. MAKE SURE YOU:   Understand these instructions.  Will watch your condition.  Will get help right away if you are not doing well or get worse. Document Released: 08/07/2007 Document Revised: 11/13/2011 Document Reviewed: 09/24/2011 [facility] Patient Information 2014 [place], [place].     Bronchitis Bronchitis is swelling (inflammation) of the air tubes leading to your lungs (bronchi). This causes mucus and a cough. If the swelling gets bad, you may have trouble breathing. HOME CARE   Rest.  Drink enough fluids to keep your pee (urine) clear or pale yellow (unless you have a condition where you have to watch how much you drink).  Only take medicine as told by your doctor. If you were given antibiotic medicines, finish them even if you start to feel better.  Avoid smoke, irritating chemicals,  and strong smells. These make the problem worse. Quit smoking if you smoke. This helps your lungs heal faster.  Use a cool mist humidifier. Change the water in the humidifier every day. You can also sit in the bathroom with hot shower running for 5 10 minutes. Keep the door closed.  See your health care provider as told.  Wash your hands often. GET HELP IF: Your problems do not get better after 1 week. GET HELP RIGHT AWAY IF:   Your fever gets worse.  You have chills.  Your chest hurts.  Your problems breathing get worse.  You have blood in your mucus.  You pass out (faint).  You feel lightheaded.  You have a bad headache.  You throw up (vomit) again and again. MAKE SURE YOU:  Understand these instructions.  Will watch your condition.  Will get help right away if you are not doing well or get worse. Document Released: 08/07/2007 Document Revised: 12/09/2012 Document Reviewed: 10/13/2012 [facility] Patient Information 2014 [place], [place].

## 2013-04-13 NOTE — Progress Notes (Signed)
Pre-visit discussion using our clinic review tool. No additional management support is needed unless otherwise documented below in the visit note.  

## 2013-04-14 ENCOUNTER — Telehealth: Payer: Self-pay | Admitting: Internal Medicine

## 2013-04-14 NOTE — Telephone Encounter (Signed)
Px by Dr. Astrid DivineJohn/sls

## 2013-04-14 NOTE — Telephone Encounter (Signed)
Patient's wife called and states that the medication that the patient was rx'd for nausea was not approved by the insurance and needs prior authorization. Meanwhile, the patient is taking his antibiotics and needs something for nausea. She does not know what to do. Please advise.

## 2013-04-15 MED ORDER — PROMETHAZINE HCL 25 MG PO TABS: 25.0000 mg | ORAL_TABLET | Freq: Four times a day (QID) | ORAL | Status: DC | PRN

## 2013-04-15 NOTE — Telephone Encounter (Signed)
Ok to try change to phenergan prn - done erx

## 2013-04-15 NOTE — Telephone Encounter (Signed)
Called left detailed message script sent in. 

## 2013-04-22 ENCOUNTER — Telehealth: Payer: Self-pay | Admitting: *Deleted

## 2013-04-22 NOTE — Telephone Encounter (Signed)
PA for Lantus Solostar approved from 04/22/2013 thru 03/03/2014.  No PA authorization # provided.

## 2013-04-23 NOTE — Telephone Encounter (Signed)
Received  PA  Approval for Lantus Solostar approved from 04/22/2013 through 03/03/2014.  [facility] as well.

## 2013-05-06 ENCOUNTER — Telehealth: Payer: Self-pay | Admitting: *Deleted

## 2013-05-06 DIAGNOSIS — G894 Chronic pain syndrome: Secondary | ICD-10-CM

## 2013-05-06 NOTE — Telephone Encounter (Signed)
Spouse phoned stating that insurance company wouldn't cover patient's Lantus and also to inquire about the status of patient's pain clinic referral.  CB# [phone]

## 2013-05-06 NOTE — Telephone Encounter (Signed)
OK to change to levemir if this is less expensive.    Very sorry, but I no longer practice chronic pain management, and can no longer help with the pain medications  All I can do is try to refer again to pain clinic, but I no longer prescribe those type of medications for chronic use

## 2013-05-06 NOTE — Telephone Encounter (Signed)
Called the wife informed was approved but the cost would be $196  Instead $42.  Insulin went from a tier 2 to a tier 4.  Advise on alternative.   Also, PCC's have tried several pain clinics but he is getting denied because was discharged from a pain clinic once stating in  violation of narcotic agreement.  PCC's Kurt Reid(Debra) stated since that note is in his chart is going to be very difficult to get him in a pain clinic.  His wife stated he has one refill left and she is getting concerned, advise please.

## 2013-05-07 MED ORDER — INSULIN DETEMIR 100 UNIT/ML ~~LOC~~ SOLN: 10.0000 [IU] | Freq: Every day | SUBCUTANEOUS | Status: DC

## 2013-05-07 NOTE — Telephone Encounter (Signed)
Both done 

## 2013-05-07 NOTE — Telephone Encounter (Signed)
Please send levemir into the pharmacy.  Also the wife does want another referral to pain management as knows nothing else to do and will look on her own as well.

## 2013-05-07 NOTE — Telephone Encounter (Signed)
Called the patient's wife Almyra Free(Libby) left message to call back

## 2013-05-25 ENCOUNTER — Telehealth: Payer: Self-pay | Admitting: *Deleted

## 2013-05-25 NOTE — Telephone Encounter (Signed)
pts wife called requesting Levimir flex pen instead of Levimir Vial.  Please advise

## 2013-05-25 NOTE — Telephone Encounter (Signed)
Ok to change rx - to robin to handle

## 2013-05-26 MED ORDER — INSULIN DETEMIR 100 UNIT/ML FLEXPEN: PEN_INJECTOR | SUBCUTANEOUS | Status: DC

## 2013-05-26 NOTE — Telephone Encounter (Signed)
Changed levemir to flexpen as requested.  Called the patients wife Almyra FreeLibby left a detailed message.

## 2013-05-27 ENCOUNTER — Telehealth: Payer: Self-pay

## 2013-05-27 NOTE — Telephone Encounter (Signed)
The patients wife called today with a lot of concern.  They have been turned down again for another pain clinic.  I spoke with Stanton KidneyDebra [facility](P[facility]) and she is trying one more, but concerned will get yet another No.  The denials have been mostly because there are no recent office visit with PCP and nothing (diagnosis) to do with chronic pain.  His last OV was for bronchitis  04/13/13.  No recent OV with PCP supporting his chronic pain syndrome.  The wife states he has 2 weeks left of medications and that is it.  She knows he will have withdrawals and does not know what to do, was very emotional on the phone. Stanton KidneyDebra can send a [facility], But there would need to be office notes supporting his chronic pain (does he need to schedule appt.??).  Debra states Dr. Jeral FruitBotero does see patients with chronic pain.  Please advise.

## 2013-05-28 NOTE — Telephone Encounter (Signed)
Called informed the wife (libby) and she agreed to all and did schedule appointment for the patient

## 2013-05-28 NOTE — Telephone Encounter (Signed)
Ok for OV for paperwork documentation purposes  Since he did not get my letter about the pain medication transition policy until feb, we can likely keep the MS Contin refills going for at least 2 more rx , but I will not be able to do  More after that , thanks

## 2013-06-02 ENCOUNTER — Encounter: Payer: Self-pay | Admitting: Internal Medicine

## 2013-06-02 ENCOUNTER — Ambulatory Visit (INDEPENDENT_AMBULATORY_CARE_PROVIDER_SITE_OTHER): Payer: No Typology Code available for payment source | Admitting: Internal Medicine

## 2013-06-02 VITALS — BP 112/82 | HR 99 | Temp 98.7°F | Ht 73.0 in | Wt 207.2 lb

## 2013-06-02 DIAGNOSIS — F329 Major depressive disorder, single episode, unspecified: Secondary | ICD-10-CM

## 2013-06-02 DIAGNOSIS — F3289 Other specified depressive episodes: Secondary | ICD-10-CM

## 2013-06-02 DIAGNOSIS — G894 Chronic pain syndrome: Secondary | ICD-10-CM

## 2013-06-02 DIAGNOSIS — R042 Hemoptysis: Secondary | ICD-10-CM

## 2013-06-02 MED ORDER — MORPHINE SULFATE ER 30 MG PO TBCR: 30.0000 mg | EXTENDED_RELEASE_TABLET | Freq: Two times a day (BID) | ORAL | Status: DC

## 2013-06-02 MED ORDER — HYDROCODONE-ACETAMINOPHEN 10-325 MG PO TABS: ORAL_TABLET | ORAL | Status: DC

## 2013-06-02 NOTE — Patient Instructions (Signed)
Please continue all other medications as before, and refills have been done if requested. Please have the pharmacy call with any other refills you may need.  Please go to the XRAY Department in the Basement (go straight as you get off the elevator) for the x-ray testing for tomorrow  You will be contacted by phone if any changes need to be made immediately.  Otherwise, you will receive a letter about your results with an explanation, but please check with MyChart first.  Please keep your appointments with your specialists as you have planned - the Pain Clinic for Jul 27, 2013

## 2013-06-02 NOTE — Progress Notes (Signed)
Subjective:    Patient ID: Kurt Reid, male    DOB: 06-16-1950, 63 y.o.   MRN: [id number]  HPI  Here with family as before.  Overal pain controlled, but will run out of meds soon.   [facility] appt with [facility] May [facility].  Pt denies chest pain, increased sob or doe, wheezing, orthopnea, PND, increased LE swelling, palpitations, dizziness or syncope, but pt describes some type of prod cough a few days ago, ? Blood.   Pt denies fever, wt loss, night sweats, loss of appetite, or other constitutional symptoms Pt denies new neurological symptoms such as new headache, or facial or extremity weakness or numbness   Pt denies polydipsia, polyuria. Denies worsening depressive symptoms, suicidal ideation, or panic; has ongoing anxiety, not increased recently.  Past Medical History  Diagnosis Date  . Depression   . Bipolar 1 disorder   . ADHD (attention deficit hyperactivity disorder)   . GERD (gastroesophageal reflux disease)   . Hepatitis C   . Hyperlipidemia   . Low back pain     chronic  . Allergic rhinitis   . Seizure disorder     H/O  . Benign prostatic hypertrophy   . Migraine   . ETOH abuse     hx  . Nephrolithiasis     hx  . Sinusitis     recurrent  . Diverticulosis of colon   . Anxiety   . ADHD 09/23/2006  . ALLERGIC RHINITIS 09/23/2006  . ANXIETY 04/19/2009  . BENIGN PROSTATIC HYPERTROPHY 11/02/2007  . BIPOLAR AFFECTIVE DISORDER 09/23/2006  . Chronic pain syndrome 01/18/2009  . DEPRESSION 09/23/2006  . DIVERTICULOSIS, COLON 04/19/2009  . ESOPHAGEAL VARICES 04/19/2009  . GERD 09/23/2006  . HEPATITIS C 09/23/2006  . HYPERLIPIDEMIA 09/23/2006  . INSOMNIA-SLEEP DISORDER-UNSPEC 04/19/2009  . LOW BACK PAIN 09/23/2006  . NEPHROLITHIASIS, HX OF 11/02/2007  . SEIZURE DISORDER 09/23/2006  . 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 Diabetes mellitus without complication    Past Surgical History  Procedure Laterality Date  . Back surgury      x 5  . Knee surgury      x 1  .  Cheekbone      hx of fx  . Tonsillectomy    . S/p sinus surgury  2010    Dr. Phebe Colla    reports that he has quit smoking. He has never used smokeless tobacco. He reports that he does not drink alcohol or use illicit drugs. family history includes Asthma in his daughter; Dementia in his father; Depression in his mother; Hyperlipidemia in his mother; Hypertension in his mother; Stroke in his mother. Allergies  Allergen Reactions  . Duloxetine     REACTION: memory dysfunction, dizziness  . Latex   . Levofloxacin Nausea Only  . Mercury   . Sulfonamide Derivatives    Current Outpatient Prescriptions on File Prior to Visit  Medication Sig Dispense Refill  . amphetamine-dextroamphetamine (ADDERALL) 10 MG tablet Take 1 tablet (10 mg total) by mouth 2 (two) times daily.  60 tablet  0  . aspirin (ASPIRIN EC) 81 MG EC tablet Take 81 mg by mouth daily.        . B-D UF III MINI PEN NEEDLES 31G X 5 MM MISC USE AS DIRECTED ONCE DAILY  100 each  2  . Blood Glucose Monitoring Suppl (ONE TOUCH ULTRA 2) W/DEVICE KIT 1 application by Does not apply route daily.  1 each  0  .  carisoprodol (SOMA) 350 MG tablet TAKE 1 TABLET BY MOUTH TWICE A DAY AS NEEDED  60 tablet  2  . gabapentin (NEURONTIN) 300 MG capsule TAKE 2 CAPSULES BY MOUTH 3 TIMES A DAY  180 capsule  5  . glipiZIDE (GLUCOTROL XL) 10 MG 24 hr tablet TAKE 1 TABLET BY MOUTH TWICE DAILY  180 tablet  3  . glucose blood (ONE TOUCH ULTRA TEST) test strip USE AS DIRECTED  100 each  11  . Insulin Detemir (LEVEMIR FLEXPEN) 100 UNIT/ML Pen Use as directed 10 units at bedtime.  15 mL  11  . lactulose (CHRONULAC) 10 GM/15ML solution Take 30 mLs (20 g total) by mouth daily.  1892 mL  1  . Lancets Misc. (ONE TOUCH SURESOFT) MISC Use as directed once per day  100 each  11  . metFORMIN (GLUCOPHAGE-XR) 500 MG 24 hr tablet TAKE 4 TABLETS BY MOUTH IN THE MORNING  360 tablet  3  . omeprazole (PRILOSEC) 20 MG capsule TAKE ONE CAPSULE BY MOUTH EVERY DAY  90  capsule  3  . promethazine (PHENERGAN) 25 MG tablet Take 1 tablet (25 mg total) by mouth every 6 (six) hours as needed for nausea or vomiting.  30 tablet  1  . propranolol (INDERAL) 20 MG tablet Take 20 mg by mouth 2 (two) times daily.      Marland Kitchen spironolactone (ALDACTONE) 50 MG tablet Take 50 mg by mouth daily.      Marland Kitchen venlafaxine XR (EFFEXOR-XR) 150 MG 24 hr capsule TAKE 2 CAPSULES BY MOUTH EVERY MORNING  180 capsule  3  . fluticasone (FLONASE) 50 MCG/ACT nasal spray 2 sprays by Nasal route daily.  16 g  2   No current facility-administered medications on file prior to visit.    Review of Systems  Constitutional: Negative for unexpected weight change, or unusual diaphoresis  HENT: Negative for tinnitus.   Eyes: Negative for photophobia and visual disturbance.  Respiratory: Negative for choking and stridor.   Gastrointestinal: Negative for vomiting and blood in stool.  Genitourinary: Negative for hematuria and decreased urine volume.  Musculoskeletal: Negative for acute joint swelling Skin: Negative for color change and wound.  Neurological: Negative for tremors and numbness other than noted  Psychiatric/Behavioral: Negative for decreased concentration or  hyperactivity.       Objective:   Physical Exam BP 112/82  Pulse 99  Temp(Src) 98.7 F (37.1 C) (Oral)  Ht '6\' 1"'  (1.854 m)  Wt 207 lb 4 oz (94.008 kg)  BMI 27.35 kg/m2  SpO2 94% VS noted,  Constitutional: Pt appears well-developed and well-nourished.  HENT: Head: NCAT.  Right Ear: External ear normal.  Left Ear: External ear normal.  Eyes: Conjunctivae and EOM are normal. Pupils are equal, round, and reactive to light.  Neck: Normal range of motion. Neck supple.  Cardiovascular: Normal rate and regular rhythm.   Pulmonary/Chest: Effort normal and breath sounds some decreased, no rales or wheezing  Abd:  Soft, NT, non-distended, + BS Neurological: Pt is alert. Not confused  Skin: Skin is warm. No erythema.  Psychiatric: Pt  behavior is normal. Thought content normal. not depressed affect    Assessment & Plan:

## 2013-06-02 NOTE — Progress Notes (Signed)
Pre visit review using our clinic review tool, if applicable. No additional management support is needed unless otherwise documented below in the visit note. 

## 2013-06-03 ENCOUNTER — Encounter: Payer: Self-pay | Admitting: Internal Medicine

## 2013-06-03 ENCOUNTER — Ambulatory Visit (INDEPENDENT_AMBULATORY_CARE_PROVIDER_SITE_OTHER)
Admission: RE | Admit: 2013-06-03 | Discharge: 2013-06-03 | Disposition: A | Discharge status: Home / Self Care | Payer: No Typology Code available for payment source | Source: Ambulatory Visit | Attending: Internal Medicine | Admitting: Internal Medicine

## 2013-06-03 DIAGNOSIS — R042 Hemoptysis: Secondary | ICD-10-CM

## 2013-06-05 DIAGNOSIS — R042 Hemoptysis: Secondary | ICD-10-CM | POA: Insufficient documentation

## 2013-06-05 NOTE — Assessment & Plan Note (Signed)
stable overall by history and exam, recent data reviewed with pt, and pt to continue medical treatment as before,  to f/u any worsening symptoms or concerns Lab Results  Component Value Date   WBC 6.5 05/07/2012   HGB 11.9* 05/07/2012   HCT 35.7* 05/07/2012   PLT 142.0* 05/07/2012   GLUCOSE 99 11/10/2012   CHOL 134 11/10/2012   TRIG 117.0 11/10/2012   HDL 48.50 11/10/2012   LDLDIRECT 81.5 11/02/2007   LDLCALC 62 11/10/2012   ALT 22 11/10/2012   AST 34 11/10/2012   NA 138 11/10/2012   K 4.3 11/10/2012   CL 105 11/10/2012   CREATININE 0.7 11/10/2012   BUN 14 11/10/2012   CO2 28 11/10/2012   TSH 1.76 05/07/2012   PSA 0.03* 05/07/2012   HGBA1C 10.5* 11/10/2012

## 2013-06-05 NOTE — Assessment & Plan Note (Signed)
Presumed but not exactly clear from pt hx, for cxr,  to f/u any worsening symptoms or concerns

## 2013-06-05 NOTE — Assessment & Plan Note (Signed)
Ok for bridge meds, f/u with pain clinic as planned

## 2013-07-05 ENCOUNTER — Encounter: Payer: Self-pay | Admitting: Internal Medicine

## 2013-07-12 ENCOUNTER — Other Ambulatory Visit: Payer: Self-pay | Admitting: Internal Medicine

## 2013-07-23 ENCOUNTER — Other Ambulatory Visit: Payer: Self-pay | Admitting: Internal Medicine

## 2013-07-27 ENCOUNTER — Telehealth: Payer: Self-pay

## 2013-07-27 DIAGNOSIS — G894 Chronic pain syndrome: Secondary | ICD-10-CM

## 2013-07-27 NOTE — Telephone Encounter (Signed)
The patients wife Almyra Free) called today to inform the patient had his appointment today with [facility] and was informed they did not take [facility].  The wife was told when making the appointment they did take his insurance.  The wife is quite upset as this has been going on for such a long time and very concerned/frustrated for her husband.  Advise please.

## 2013-07-28 NOTE — Telephone Encounter (Signed)
Not sure what to say, except that we can try refer to different pain management, as I am no longer practicing chronic pain management

## 2013-07-28 NOTE — Telephone Encounter (Signed)
The wife has been informed.  She would like the patient to be referred to [facility] on [address] [place] phone number [phone]

## 2013-07-28 NOTE — Telephone Encounter (Signed)
Done per emr 

## 2013-07-29 NOTE — Telephone Encounter (Signed)
Called the patients wife to inform referral made.

## 2013-08-05 ENCOUNTER — Telehealth: Payer: Self-pay

## 2013-08-05 NOTE — Telephone Encounter (Signed)
Message copied by Pincus Sanes on Thu Aug 05, 2013  5:15 PM ------      Message from: Peter Congo T      Created: Thu Aug 05, 2013  3:50 PM      Contact: [phone]       Pt's wife called and was inquiring on the status of her husband's referral to pain management.  Pt states that her husband is almost out of her pain medication.  I placed pt's wife on the phone with our referral coordinator mary so that she could update the pt's wife ------

## 2013-08-05 NOTE — Telephone Encounter (Signed)
Patients wife did discuss with [facility].

## 2013-08-10 ENCOUNTER — Telehealth: Payer: Self-pay

## 2013-08-10 MED ORDER — MORPHINE SULFATE ER 30 MG PO TBCR: 30.0000 mg | EXTENDED_RELEASE_TABLET | Freq: Two times a day (BID) | ORAL | Status: DC

## 2013-08-10 MED ORDER — HYDROCODONE-ACETAMINOPHEN 10-325 MG PO TABS: ORAL_TABLET | ORAL | Status: DC

## 2013-08-10 NOTE — Telephone Encounter (Signed)
The patients wife Almyra Free called this morning to inform the patient took his last pain medication this morning.  They still have not gotten an appointment with pain management.  Numerous things have not worked out for this patient and pain management appt. That have been out of his control.  His wife has been in contact with [facility] and they are now waiting on a decision from [facility] in [place].  The wife would like to know if he could have one more month of refills as he took the last one this am and has been on this medication for years and cannot just stop.  Advise please [phone].

## 2013-08-10 NOTE — Telephone Encounter (Signed)
Called left message to call back 

## 2013-08-10 NOTE — Telephone Encounter (Signed)
Called Libby informed to pickup hardcopy at the front desk.

## 2013-08-10 NOTE — Telephone Encounter (Signed)
OK for one more month. Done hardcopy to robin

## 2013-08-27 ENCOUNTER — Other Ambulatory Visit: Payer: Self-pay | Admitting: Internal Medicine

## 2013-08-27 ENCOUNTER — Telehealth: Payer: Self-pay | Admitting: Internal Medicine

## 2013-08-27 NOTE — Telephone Encounter (Signed)
Dr Jonny RuizJohn i have sent referral to [facility] who has declined to see pt

## 2013-08-27 NOTE — Telephone Encounter (Signed)
Unfortunately, I have nothing further to offer this patient who requires chronic pain management that I do not perform as part of my general practice  A letter is sent

## 2013-09-09 ENCOUNTER — Ambulatory Visit (INDEPENDENT_AMBULATORY_CARE_PROVIDER_SITE_OTHER): Payer: No Typology Code available for payment source | Admitting: Internal Medicine

## 2013-09-09 ENCOUNTER — Encounter: Payer: Self-pay | Admitting: Internal Medicine

## 2013-09-09 VITALS — BP 120/70 | HR 93 | Temp 98.9°F | Wt 208.0 lb

## 2013-09-09 DIAGNOSIS — M545 Low back pain, unspecified: Secondary | ICD-10-CM

## 2013-09-09 DIAGNOSIS — G894 Chronic pain syndrome: Secondary | ICD-10-CM

## 2013-09-09 DIAGNOSIS — IMO0001 Reserved for inherently not codable concepts without codable children: Secondary | ICD-10-CM

## 2013-09-09 DIAGNOSIS — E1165 Type 2 diabetes mellitus with hyperglycemia: Secondary | ICD-10-CM

## 2013-09-09 DIAGNOSIS — M5137 Other intervertebral disc degeneration, lumbosacral region: Secondary | ICD-10-CM

## 2013-09-09 DIAGNOSIS — M5136 Other intervertebral disc degeneration, lumbar region: Secondary | ICD-10-CM

## 2013-09-09 MED ORDER — HYDROCODONE-ACETAMINOPHEN 10-325 MG PO TABS: ORAL_TABLET | ORAL | Status: DC

## 2013-09-09 MED ORDER — MORPHINE SULFATE ER 30 MG PO TBCR: 30.0000 mg | EXTENDED_RELEASE_TABLET | Freq: Two times a day (BID) | ORAL | Status: DC

## 2013-09-09 NOTE — Progress Notes (Signed)
Subjective:    Patient ID: Kurt Reid, male    DOB: 10-Oct-1950, 63 y.o.   MRN: [id number]  HPI  Pt continues to have recurring LBP without change in severity, bowel or bladder change, fever, wt loss,  worsening LE pain/numbness/weakness, gait change or falls. Has been turned down by several pain managment, with the exception of one, but did not take insurance. Has not seen Dr Nelva Bush in past, no recent ortho eval Past Medical History  Diagnosis Date  . Depression   . Bipolar 1 disorder   . ADHD (attention deficit hyperactivity disorder)   . GERD (gastroesophageal reflux disease)   . Hepatitis C   . Hyperlipidemia   . Low back pain     chronic  . Allergic rhinitis   . Seizure disorder     H/O  . Benign prostatic hypertrophy   . Migraine   . ETOH abuse     hx  . Nephrolithiasis     hx  . Sinusitis     recurrent  . Diverticulosis of colon   . Anxiety   . ADHD 09/23/2006  . ALLERGIC RHINITIS 09/23/2006  . ANXIETY 04/19/2009  . BENIGN PROSTATIC HYPERTROPHY 11/02/2007  . BIPOLAR AFFECTIVE DISORDER 09/23/2006  . Chronic pain syndrome 01/18/2009  . DEPRESSION 09/23/2006  . DIVERTICULOSIS, COLON 04/19/2009  . ESOPHAGEAL VARICES 04/19/2009  . GERD 09/23/2006  . HEPATITIS C 09/23/2006  . HYPERLIPIDEMIA 09/23/2006  . INSOMNIA-SLEEP DISORDER-UNSPEC 04/19/2009  . LOW BACK PAIN 09/23/2006  . NEPHROLITHIASIS, HX OF 11/02/2007  . SEIZURE DISORDER 09/23/2006  . 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 Diabetes mellitus without complication    Past Surgical History  Procedure Laterality Date  . Back surgury      x 5  . Knee surgury      x 1  . Cheekbone      hx of fx  . Tonsillectomy    . S/p sinus surgury  2010    Dr. Phebe Colla    reports that he has quit smoking. He has never used smokeless tobacco. He reports that he does not drink alcohol or use illicit drugs. family history includes Asthma in his daughter; Dementia in his father; Depression in his mother;  Hyperlipidemia in his mother; Hypertension in his mother; Stroke in his mother. Allergies  Allergen Reactions  . Duloxetine     REACTION: memory dysfunction, dizziness  . Latex   . Levofloxacin Nausea Only  . Mercury   . Sulfonamide Derivatives    Current Outpatient Prescriptions on File Prior to Visit  Medication Sig Dispense Refill  . aspirin (ASPIRIN EC) 81 MG EC tablet Take 81 mg by mouth daily.        . B-D UF III MINI PEN NEEDLES 31G X 5 MM MISC USE AS DIRECTED ONCE DAILY  100 each  11  . Blood Glucose Monitoring Suppl (ONE TOUCH ULTRA 2) W/DEVICE KIT 1 application by Does not apply route daily.  1 each  0  . fluticasone (FLONASE) 50 MCG/ACT nasal spray 2 sprays by Nasal route daily.  16 g  2  . gabapentin (NEURONTIN) 300 MG capsule TAKE 2 CAPSULES BY MOUTH 3 TIMES A DAY  180 capsule  5  . glipiZIDE (GLUCOTROL XL) 10 MG 24 hr tablet TAKE 1 TABLET BY MOUTH TWICE DAILY  180 tablet  3  . glucose blood (ONE TOUCH ULTRA TEST) test strip USE AS DIRECTED  100 each  11  . Insulin  Detemir (LEVEMIR FLEXPEN) 100 UNIT/ML Pen Use as directed 10 units at bedtime.  15 mL  11  . lactulose (CHRONULAC) 10 GM/15ML solution Take 30 mLs (20 g total) by mouth daily.  1892 mL  1  . Lancets Misc. (ONE TOUCH SURESOFT) MISC Use as directed once per day  100 each  11  . metFORMIN (GLUCOPHAGE-XR) 500 MG 24 hr tablet TAKE 4 TABLETS BY MOUTH IN THE MORNING  360 tablet  3  . omeprazole (PRILOSEC) 20 MG capsule TAKE ONE CAPSULE BY MOUTH EVERY DAY  90 capsule  3  . propranolol (INDERAL) 20 MG tablet Take 20 mg by mouth 2 (two) times daily.      Marland Kitchen spironolactone (ALDACTONE) 50 MG tablet Take 50 mg by mouth daily.      Marland Kitchen venlafaxine XR (EFFEXOR-XR) 150 MG 24 hr capsule TAKE 2 CAPSULES BY MOUTH EVERY MORNING  180 capsule  3   No current facility-administered medications on file prior to visit.   Review of Systems  Constitutional: Negative for unusual diaphoresis or other sweats  HENT: Negative for ringing in  ear Eyes: Negative for double vision or worsening visual disturbance.  Respiratory: Negative for choking and stridor.   Gastrointestinal: Negative for vomiting or other signifcant bowel change Genitourinary: Negative for hematuria or decreased urine volume.  Musculoskeletal: Negative for other MSK pain or swelling Skin: Negative for color change and worsening wound.  Neurological: Negative for tremors and numbness other than noted  Psychiatric/Behavioral: Negative for decreased concentration or agitation other than above       Objective:   Physical Exam BP 120/70  Pulse 93  Temp(Src) 98.9 F (37.2 C) (Oral)  Wt 208 lb (94.348 kg)  SpO2 94% VS noted,  Constitutional: Pt appears well-developed, well-nourished.  HENT: Head: NCAT.  Right Ear: External ear normal.  Left Ear: External ear normal.  Eyes: . Pupils are equal, round, and reactive to light. Conjunctivae and EOM are normal Neck: Normal range of motion. Neck supple.  Cardiovascular: Normal rate and regular rhythm.   Pulmonary/Chest: Effort normal and breath sounds normal.  Spine nontender Neurological: Pt is alert. Not confused , motor grossly intact Skin: Skin is warm. No rash Psychiatric: Pt behavior is normal. No agitation.     Assessment & Plan:

## 2013-09-09 NOTE — Progress Notes (Signed)
Pre visit review using our clinic review tool, if applicable. No additional management support is needed unless otherwise documented below in the visit note. 

## 2013-09-09 NOTE — Patient Instructions (Addendum)
Please continue all other medications as before, and refills have been done if requested.  Please have the pharmacy call with any other refills you may need.  Please continue your efforts at being more active, low cholesterol diet, and weight control.  Please keep your appointments with your specialists as you may have planned  You will be contacted regarding the referral for: Dr Ramos/orthopedic and pain management  Please return in 3 months, or sooner if needed

## 2013-09-12 NOTE — Assessment & Plan Note (Signed)
stable overall by history and exam, recent data reviewed with pt, and pt to continue medical treatment as before,  to f/u any worsening symptoms or concerns Lab Results  Component Value Date   HGBA1C 10.5* 11/10/2012   D/w pt, per wife cbg's have been in 100s, decline f/u a1c today, for lab next visti

## 2013-09-12 NOTE — Assessment & Plan Note (Signed)
For ortho referall/dr ramos, hopefully to allow chronic pain management as well

## 2013-09-12 NOTE — Assessment & Plan Note (Signed)
For ortho referall/dr ramos, hopefully to allow chronic pain management as well 

## 2013-09-14 ENCOUNTER — Telehealth: Payer: Self-pay

## 2013-09-14 MED ORDER — MORPHINE SULFATE ER 30 MG PO TBCR: 30.0000 mg | EXTENDED_RELEASE_TABLET | Freq: Two times a day (BID) | ORAL | Status: DC

## 2013-09-14 MED ORDER — HYDROCODONE-ACETAMINOPHEN 10-325 MG PO TABS: ORAL_TABLET | ORAL | Status: DC

## 2013-09-14 NOTE — Telephone Encounter (Signed)
Done hardcopy to robin  

## 2013-09-14 NOTE — Telephone Encounter (Signed)
The [facility] has [facility] With Dr. Ethelene Halamos on July 20.2015.  The wife states the patient will be out of his meds. Approx. July 8th or 9th.  Would PCP be able to cover him until appt. With Dr. Ethelene Halamos?

## 2013-09-14 NOTE — Telephone Encounter (Signed)
The prescriptions were dated to fill on July 9th and they did fill, but will last one month until August 9th, the appointment with Dr. Ethelene Halamos is not until October 21, 2013 (MY mistake I put July 20th below).  She is requesting prescriptions  To cover him until that appointment in august.  Sorry for the confusion on the months, that was my mistake.

## 2013-09-14 NOTE — Telephone Encounter (Signed)
Called the wife left message to call back. 

## 2013-09-14 NOTE — Telephone Encounter (Signed)
I am confused since Kurt Reid just received a rx last wk that should last for one month  I dont understand why Kurt Reid would run out before seeing Dr Ethelene Halamos on July 20

## 2013-09-15 ENCOUNTER — Telehealth: Payer: Self-pay

## 2013-09-15 DIAGNOSIS — Z0000 Encounter for general adult medical examination without abnormal findings: Secondary | ICD-10-CM

## 2013-09-15 NOTE — Telephone Encounter (Signed)
cpx and diabetic bundle labs entered.

## 2013-09-15 NOTE — Telephone Encounter (Signed)
Called the patient informed to pickup hardcopy's at the front desk. 

## 2013-10-05 ENCOUNTER — Other Ambulatory Visit (INDEPENDENT_AMBULATORY_CARE_PROVIDER_SITE_OTHER): Payer: No Typology Code available for payment source

## 2013-10-05 DIAGNOSIS — Z0000 Encounter for general adult medical examination without abnormal findings: Secondary | ICD-10-CM

## 2013-10-05 LAB — HEPATIC FUNCTION PANEL
ALT: 27 U/L (ref 0–53)
AST: 34 U/L (ref 0–37)
Albumin: 2.9 g/dL — ABNORMAL LOW (ref 3.5–5.2)
Alkaline Phosphatase: 137 U/L — ABNORMAL HIGH (ref 39–117)
BILIRUBIN DIRECT: 0.3 mg/dL (ref 0.0–0.3)
BILIRUBIN TOTAL: 1 mg/dL (ref 0.2–1.2)
TOTAL PROTEIN: 7 g/dL (ref 6.0–8.3)

## 2013-10-05 LAB — LIPID PANEL
CHOLESTEROL: 144 mg/dL (ref 0–200)
HDL: 38.2 mg/dL — AB (ref >39.00)
LDL Cholesterol: 79 mg/dL (ref 0–99)
NONHDL: 105.8
Total CHOL/HDL Ratio: 4
Triglycerides: 134 mg/dL (ref 0.0–149.0)
VLDL: 26.8 mg/dL (ref 0.0–40.0)

## 2013-10-05 LAB — BASIC METABOLIC PANEL
BUN: 12 mg/dL (ref 6–23)
CHLORIDE: 103 meq/L (ref 96–112)
CO2: 28 mEq/L (ref 19–32)
Calcium: 9 mg/dL (ref 8.4–10.5)
Creatinine, Ser: 0.7 mg/dL (ref 0.4–1.5)
GFR: 122.84 mL/min (ref >60.00)
Glucose, Bld: 281 mg/dL — ABNORMAL HIGH (ref 70–99)
POTASSIUM: 4 meq/L (ref 3.5–5.1)
SODIUM: 135 meq/L (ref 135–145)

## 2013-10-05 LAB — CBC WITH DIFFERENTIAL/PLATELET
Basophils Absolute: 0 10*3/uL (ref 0.0–0.1)
Basophils Relative: 0.6 % (ref 0.0–3.0)
EOS PCT: 4.8 % (ref 0.0–5.0)
Eosinophils Absolute: 0.3 10*3/uL (ref 0.0–0.7)
HCT: 41 % (ref 39.0–52.0)
Hemoglobin: 13.8 g/dL (ref 13.0–17.0)
Lymphocytes Relative: 26.9 % (ref 12.0–46.0)
Lymphs Abs: 1.6 10*3/uL (ref 0.7–4.0)
MCHC: 33.7 g/dL (ref 30.0–36.0)
MCV: 89.5 fl (ref 78.0–100.0)
MONO ABS: 0.4 10*3/uL (ref 0.1–1.0)
MONOS PCT: 7.5 % (ref 3.0–12.0)
NEUTROS PCT: 60.2 % (ref 43.0–77.0)
Neutro Abs: 3.5 10*3/uL (ref 1.4–7.7)
PLATELETS: 118 10*3/uL — AB (ref 150.0–400.0)
RBC: 4.58 Mil/uL (ref 4.22–5.81)
RDW: 14.6 % (ref 11.5–15.5)
WBC: 5.8 10*3/uL (ref 4.0–10.5)

## 2013-10-05 LAB — PSA: PSA: 0.02 ng/mL — AB (ref 0.10–4.00)

## 2013-10-05 LAB — HEMOGLOBIN A1C: Hgb A1c MFr Bld: 12.5 % — ABNORMAL HIGH (ref 4.6–6.5)

## 2013-10-05 LAB — TSH: TSH: 1.02 u[IU]/mL (ref 0.35–4.50)

## 2013-10-07 ENCOUNTER — Encounter: Payer: Self-pay | Admitting: Internal Medicine

## 2013-10-07 ENCOUNTER — Ambulatory Visit (INDEPENDENT_AMBULATORY_CARE_PROVIDER_SITE_OTHER): Payer: No Typology Code available for payment source | Admitting: Internal Medicine

## 2013-10-07 VITALS — BP 132/82 | HR 88 | Temp 97.9°F | Ht 74.0 in | Wt 206.5 lb

## 2013-10-07 DIAGNOSIS — Z23 Encounter for immunization: Secondary | ICD-10-CM

## 2013-10-07 DIAGNOSIS — F329 Major depressive disorder, single episode, unspecified: Secondary | ICD-10-CM

## 2013-10-07 DIAGNOSIS — G894 Chronic pain syndrome: Secondary | ICD-10-CM

## 2013-10-07 DIAGNOSIS — IMO0001 Reserved for inherently not codable concepts without codable children: Secondary | ICD-10-CM

## 2013-10-07 DIAGNOSIS — F3289 Other specified depressive episodes: Secondary | ICD-10-CM

## 2013-10-07 DIAGNOSIS — E1165 Type 2 diabetes mellitus with hyperglycemia: Secondary | ICD-10-CM

## 2013-10-07 DIAGNOSIS — Z0000 Encounter for general adult medical examination without abnormal findings: Secondary | ICD-10-CM

## 2013-10-07 LAB — URINALYSIS, ROUTINE W REFLEX MICROSCOPIC
Bilirubin Urine: NEGATIVE
Hgb urine dipstick: NEGATIVE
Ketones, ur: NEGATIVE
Leukocytes, UA: NEGATIVE
NITRITE: NEGATIVE
PH: 6 (ref 5.0–8.0)
SPECIFIC GRAVITY, URINE: 1.01 (ref 1.000–1.030)
Total Protein, Urine: NEGATIVE
Urine Glucose: >=1000 — AB
Urobilinogen, UA: 4 — AB (ref 0.0–1.0)

## 2013-10-07 MED ORDER — GLUCOSE BLOOD VI STRP: ORAL_STRIP | Status: DC

## 2013-10-07 MED ORDER — LANCETS MISC: Status: DC

## 2013-10-07 MED ORDER — INSULIN DETEMIR 100 UNIT/ML FLEXPEN: PEN_INJECTOR | SUBCUTANEOUS | Status: DC

## 2013-10-07 NOTE — Assessment & Plan Note (Signed)
stable overall by history and exam, and pt to continue medical treatment as before,  to f/u any worsening symptoms or concerns 

## 2013-10-07 NOTE — Patient Instructions (Addendum)
You had the new Prevnar pneuonia shot today  OK to increase the levemir to 40 units per day  Check the blood sugars at least 3 times per day before meals  OK to increase the levemir by 3 units every 3 days if the Average blood sugars are still over 175;  There is no need to stop doing that until you seen the sugars get down at some point during the day to 130-150  Please continue all other medications as before, and refills have been done if requested.  Please have the pharmacy call with any other refills you may need.  Please continue your efforts at being more active, low cholesterol diet, and weight control.  You are otherwise up to date with prevention measures today.  Please keep your appointments with your specialists as you may have planned  Please return in 3 months, or sooner if needed, with Lab testing done 3-5 days before

## 2013-10-07 NOTE — Assessment & Plan Note (Signed)

## 2013-10-07 NOTE — Progress Notes (Signed)
Pre visit review using our clinic review tool, if applicable. No additional management support is needed unless otherwise documented below in the visit note. 

## 2013-10-07 NOTE — Assessment & Plan Note (Signed)
Uncontrolled but o/w stable overall by history and exam, recent data reviewed with pt, to increase levemir to 40 units qd with self titratino up asd, check cbgs at least tid, and pt to o/w continue medical treatment as before,  to f/u next visit a1c Lab Results  Component Value Date   HGBA1C 12.5* 10/05/2013

## 2013-10-07 NOTE — Progress Notes (Signed)
Subjective:    Patient ID: Kurt Reid, male    DOB: 12-23-1950, 63 y.o.   MRN: [id number]  HPI  Here for wellness and f/u with wife;  Overall doing ok;  Pt denies CP, worsening SOB, DOE, wheezing, orthopnea, PND, worsening LE edema, palpitations, dizziness or syncope.  Pt denies neurological change such as new headache, facial or extremity weakness.  Pt denies polydipsia, polyuria, or low sugar symptoms. Pt states overall good compliance with treatment and medications, good tolerability, and has been trying to follow lower cholesterol diet.  Pt denies worsening depressive symptoms, suicidal ideation or panic. No fever, night sweats, wt loss, loss of appetite, or other constitutional symptoms.  Pt states good ability with ADL's, has low fall risk, home safety reviewed and adequate, no other significant changes in hearing or vision, and only rarely occasionally active with exercise. Pt continues to have recurring LBP without change in severity, bowel or bladder change, fever, wt loss,  worsening LE pain/numbness/weakness, gait change or falls. Has been to Dm education in past, admits to poor dietary compliance  Not checking cbgs recently. Has appt Dr Derrell Lolling aug 20, ortho for back pain Past Medical History  Diagnosis Date  . Depression   . Bipolar 1 disorder   . ADHD (attention deficit hyperactivity disorder)   . GERD (gastroesophageal reflux disease)   . Hepatitis C   . Hyperlipidemia   . Low back pain     chronic  . Allergic rhinitis   . Seizure disorder     H/O  . Benign prostatic hypertrophy   . Migraine   . ETOH abuse     hx  . Nephrolithiasis     hx  . Sinusitis     recurrent  . Diverticulosis of colon   . Anxiety   . ADHD 09/23/2006  . ALLERGIC RHINITIS 09/23/2006  . ANXIETY 04/19/2009  . BENIGN PROSTATIC HYPERTROPHY 11/02/2007  . BIPOLAR AFFECTIVE DISORDER 09/23/2006  . Chronic pain syndrome 01/18/2009  . DEPRESSION 09/23/2006  . DIVERTICULOSIS, COLON 04/19/2009  .  ESOPHAGEAL VARICES 04/19/2009  . GERD 09/23/2006  . HEPATITIS C 09/23/2006  . HYPERLIPIDEMIA 09/23/2006  . INSOMNIA-SLEEP DISORDER-UNSPEC 04/19/2009  . LOW BACK PAIN 09/23/2006  . NEPHROLITHIASIS, HX OF 11/02/2007  . SEIZURE DISORDER 09/23/2006  . 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 Diabetes mellitus without complication    Past Surgical History  Procedure Laterality Date  . Back surgury      x 5  . Knee surgury      x 1  . Cheekbone      hx of fx  . Tonsillectomy    . S/p sinus surgury  2010    Dr. Zara Chess    reports that he has quit smoking. He has never used smokeless tobacco. He reports that he does not drink alcohol or use illicit drugs. family history includes Asthma in his daughter; Dementia in his father; Depression in his mother; Hyperlipidemia in his mother; Hypertension in his mother; Stroke in his mother. Allergies  Allergen Reactions  . Duloxetine     REACTION: memory dysfunction, dizziness  . Latex   . Levofloxacin Nausea Only  . Mercury   . Sulfonamide Derivatives    Current Outpatient Prescriptions on File Prior to Visit  Medication Sig Dispense Refill  . aspirin (ASPIRIN EC) 81 MG EC tablet Take 81 mg by mouth daily.        . B-D UF III MINI PEN NEEDLES 31G  X 5 MM MISC USE AS DIRECTED ONCE DAILY  100 each  11  . gabapentin (NEURONTIN) 300 MG capsule TAKE 2 CAPSULES BY MOUTH 3 TIMES A DAY  180 capsule  5  . glipiZIDE (GLUCOTROL XL) 10 MG 24 hr tablet TAKE 1 TABLET BY MOUTH TWICE DAILY  180 tablet  3  . HYDROcodone-acetaminophen (NORCO) 10-325 MG per tablet TAKE 1 TABLET BY MOUTH EVERY 6 HOURS AS NEEDED FOR PAIN - to fill Oct 09, 2013  120 tablet  0  . lactulose (CHRONULAC) 10 GM/15ML solution Take 30 mLs (20 g total) by mouth daily.  1892 mL  1  . Lancets Misc. (ONE TOUCH SURESOFT) MISC Use as directed once per day  100 each  11  . metFORMIN (GLUCOPHAGE-XR) 500 MG 24 hr tablet TAKE 4 TABLETS BY MOUTH IN THE MORNING  360 tablet  3  . morphine (MS  CONTIN) 30 MG 12 hr tablet Take 1 tablet (30 mg total) by mouth 2 (two) times daily. To fill Oct 09, 2013  60 tablet  0  . omeprazole (PRILOSEC) 20 MG capsule TAKE ONE CAPSULE BY MOUTH EVERY DAY  90 capsule  3  . propranolol (INDERAL) 20 MG tablet Take 20 mg by mouth 2 (two) times daily.      Marland Kitchen spironolactone (ALDACTONE) 50 MG tablet Take 50 mg by mouth daily.      Marland Kitchen venlafaxine XR (EFFEXOR-XR) 150 MG 24 hr capsule TAKE 2 CAPSULES BY MOUTH EVERY MORNING  180 capsule  3  . fluticasone (FLONASE) 50 MCG/ACT nasal spray 2 sprays by Nasal route daily.  16 g  2   No current facility-administered medications on file prior to visit.    Review of Systems Constitutional: Negative for increased diaphoresis, other activity, appetite or other siginficant weight change  HENT: Negative for worsening hearing loss, ear pain, facial swelling, mouth sores and neck stiffness.   Eyes: Negative for other worsening pain, redness or visual disturbance.  Respiratory: Negative for shortness of breath and wheezing.   Cardiovascular: Negative for chest pain and palpitations.  Gastrointestinal: Negative for diarrhea, blood in stool, abdominal distention or other pain Genitourinary: Negative for hematuria, flank pain or change in urine volume.  Musculoskeletal: Negative for myalgias or other joint complaints.  Skin: Negative for color change and wound.  Neurological: Negative for syncope and numbness. other than noted Hematological: Negative for adenopathy. or other swelling Psychiatric/Behavioral: Negative for hallucinations, self-injury, decreased concentration or other worsening agitation.      Objective:   Physical Exam BP 132/82  Pulse 88  Temp(Src) 97.9 F (36.6 C) (Oral)  Ht 6\' 2"  (1.88 m)  Wt 206 lb 8 oz (93.668 kg)  BMI 26.50 kg/m2  SpO2 96% VS noted,  Constitutional: Pt is oriented to person, place, and time. Appears well-developed and well-nourished.  Head: Normocephalic and atraumatic.  Right Ear:  External ear normal.  Left Ear: External ear normal.  Nose: Nose normal.  Mouth/Throat: Oropharynx is clear and moist. edentulous Eyes: Conjunctivae and EOM are normal. Pupils are equal, round, and reactive to light.  Neck: Normal range of motion. Neck supple. No JVD present. No tracheal deviation present.  Cardiovascular: Normal rate, regular rhythm, normal heart sounds and intact distal pulses.   Pulmonary/Chest: Effort normal and breath sounds without rales or wheezing  Abdominal: Soft. Bowel sounds are normal. NT. No HSM  Musculoskeletal: Normal range of motion. Exhibits no edema.  Lymphadenopathy:  Has no cervical adenopathy.  Neurological: Pt is alert and  oriented to person, place, and time. Pt has normal reflexes. No cranial nerve deficit. Motor grossly intact Skin: Skin is warm and dry. No rash noted.  Psychiatric:  Has mild nervous mood and affect. Behavior is normal.     Assessment & Plan:

## 2013-11-02 ENCOUNTER — Telehealth: Payer: Self-pay | Admitting: *Deleted

## 2013-11-02 MED ORDER — HYDROCODONE-ACETAMINOPHEN 10-325 MG PO TABS: ORAL_TABLET | ORAL | Status: DC

## 2013-11-02 MED ORDER — MORPHINE SULFATE ER 30 MG PO TBCR: 30.0000 mg | EXTENDED_RELEASE_TABLET | Freq: Two times a day (BID) | ORAL | Status: DC

## 2013-11-02 NOTE — Telephone Encounter (Signed)
Done hardcopy to robin  

## 2013-11-02 NOTE — Telephone Encounter (Signed)
Notified pt both rx's are ready for pick-up...Raechel Chute

## 2013-11-02 NOTE — Telephone Encounter (Signed)
rx Done hardcopy to robin  OK to forward any records needed, as I am not sure what exactly Dr Ethelene Hal would want; wife can also request record be forwarded to Dr Ethelene Hal as well

## 2013-11-02 NOTE — Telephone Encounter (Signed)
Wife return call bck she stated that she has some disability forms that need to be filled out by Dr. Jonny Ruiz. Inform wife she can drop off forms takes any where from 24-48 hours on paperwork. Also wife states that when they went to see Dr. Ethelene Hal on 10/21/13 the nurse told them they didn't have any medical records on husband from Dr. Jonny Ruiz or pain clinic. They reschedule appt to see Dr. Ethelene Hal 11/17/13, but husband will be out of pain meds on the 8th. Requesting enough pain med to last until they see Dr. Ethelene Hal...Raechel Chute

## 2013-11-02 NOTE — Telephone Encounter (Signed)
Left smg on triage requesting to speak with robin. Have ? Concerning disability form & medications. Called Libby back no answer LMOM RTC...Raechel Chute

## 2013-11-02 NOTE — Telephone Encounter (Signed)
Notified pt with md response. Wife stated pt is also needing the morphine as well...Raechel Chute

## 2013-11-05 DIAGNOSIS — Z0279 Encounter for issue of other medical certificate: Secondary | ICD-10-CM

## 2013-11-16 ENCOUNTER — Telehealth: Payer: Self-pay | Admitting: *Deleted

## 2013-11-16 NOTE — Telephone Encounter (Signed)
Unfortunately I would not be able to say, since our current electronic record only goes back 3-4 yrs.

## 2013-11-16 NOTE — Telephone Encounter (Signed)
Left msg on triage stating  Filling out form for husband disability needing to know when he was dx with Hep C & diabetes...Raechel Chute

## 2013-11-16 NOTE — Telephone Encounter (Signed)
Notified pt wife with md response.../lmb 

## 2013-12-09 ENCOUNTER — Encounter (HOSPITAL_BASED_OUTPATIENT_CLINIC_OR_DEPARTMENT_OTHER): Payer: Self-pay | Admitting: Emergency Medicine

## 2013-12-09 DIAGNOSIS — E1140 Type 2 diabetes mellitus with diabetic neuropathy, unspecified: Secondary | ICD-10-CM | POA: Diagnosis present

## 2013-12-09 DIAGNOSIS — K7030 Alcoholic cirrhosis of liver without ascites: Secondary | ICD-10-CM | POA: Diagnosis present

## 2013-12-09 DIAGNOSIS — I8510 Secondary esophageal varices without bleeding: Secondary | ICD-10-CM | POA: Diagnosis present

## 2013-12-09 DIAGNOSIS — F1021 Alcohol dependence, in remission: Secondary | ICD-10-CM | POA: Diagnosis present

## 2013-12-09 DIAGNOSIS — Z91040 Latex allergy status: Secondary | ICD-10-CM

## 2013-12-09 DIAGNOSIS — S61431A Puncture wound without foreign body of right hand, initial encounter: Secondary | ICD-10-CM | POA: Diagnosis not present

## 2013-12-09 DIAGNOSIS — Z882 Allergy status to sulfonamides status: Secondary | ICD-10-CM

## 2013-12-09 DIAGNOSIS — Z8249 Family history of ischemic heart disease and other diseases of the circulatory system: Secondary | ICD-10-CM

## 2013-12-09 DIAGNOSIS — M545 Low back pain: Secondary | ICD-10-CM | POA: Diagnosis present

## 2013-12-09 DIAGNOSIS — W540XXA Bitten by dog, initial encounter: Secondary | ICD-10-CM

## 2013-12-09 DIAGNOSIS — G894 Chronic pain syndrome: Secondary | ICD-10-CM | POA: Diagnosis present

## 2013-12-09 DIAGNOSIS — F319 Bipolar disorder, unspecified: Secondary | ICD-10-CM | POA: Diagnosis present

## 2013-12-09 DIAGNOSIS — M79641 Pain in right hand: Secondary | ICD-10-CM | POA: Diagnosis not present

## 2013-12-09 DIAGNOSIS — Z23 Encounter for immunization: Secondary | ICD-10-CM

## 2013-12-09 DIAGNOSIS — F909 Attention-deficit hyperactivity disorder, unspecified type: Secondary | ICD-10-CM | POA: Diagnosis present

## 2013-12-09 DIAGNOSIS — Z7982 Long term (current) use of aspirin: Secondary | ICD-10-CM

## 2013-12-09 DIAGNOSIS — B1920 Unspecified viral hepatitis C without hepatic coma: Secondary | ICD-10-CM | POA: Diagnosis present

## 2013-12-09 DIAGNOSIS — G40909 Epilepsy, unspecified, not intractable, without status epilepticus: Secondary | ICD-10-CM | POA: Diagnosis present

## 2013-12-09 DIAGNOSIS — Z87891 Personal history of nicotine dependence: Secondary | ICD-10-CM

## 2013-12-09 DIAGNOSIS — Z823 Family history of stroke: Secondary | ICD-10-CM

## 2013-12-09 DIAGNOSIS — K766 Portal hypertension: Secondary | ICD-10-CM | POA: Diagnosis present

## 2013-12-09 DIAGNOSIS — Y939 Activity, unspecified: Secondary | ICD-10-CM

## 2013-12-09 DIAGNOSIS — I891 Lymphangitis: Secondary | ICD-10-CM | POA: Diagnosis present

## 2013-12-09 DIAGNOSIS — K219 Gastro-esophageal reflux disease without esophagitis: Secondary | ICD-10-CM | POA: Diagnosis present

## 2013-12-09 DIAGNOSIS — J309 Allergic rhinitis, unspecified: Secondary | ICD-10-CM | POA: Diagnosis present

## 2013-12-09 DIAGNOSIS — Z794 Long term (current) use of insulin: Secondary | ICD-10-CM

## 2013-12-09 DIAGNOSIS — L03113 Cellulitis of right upper limb: Secondary | ICD-10-CM | POA: Diagnosis present

## 2013-12-09 DIAGNOSIS — Z8614 Personal history of Methicillin resistant Staphylococcus aureus infection: Secondary | ICD-10-CM

## 2013-12-09 DIAGNOSIS — E785 Hyperlipidemia, unspecified: Secondary | ICD-10-CM | POA: Diagnosis present

## 2013-12-09 DIAGNOSIS — Z888 Allergy status to other drugs, medicaments and biological substances status: Secondary | ICD-10-CM

## 2013-12-09 DIAGNOSIS — Z825 Family history of asthma and other chronic lower respiratory diseases: Secondary | ICD-10-CM

## 2013-12-09 DIAGNOSIS — Z881 Allergy status to other antibiotic agents status: Secondary | ICD-10-CM

## 2013-12-09 NOTE — ED Notes (Signed)
His dog bit him 2 days ago. Swelling, chills and redness to his right hand. The dog has had his rabies vaccine.

## 2013-12-10 ENCOUNTER — Inpatient Hospital Stay (HOSPITAL_BASED_OUTPATIENT_CLINIC_OR_DEPARTMENT_OTHER)
Admission: EM | Admit: 2013-12-10 | Discharge: 2013-12-12 | DRG: 982 | Disposition: A | Discharge status: Home / Self Care | Payer: Medicare Other | Attending: Orthopedic Surgery | Admitting: Orthopedic Surgery

## 2013-12-10 ENCOUNTER — Encounter (HOSPITAL_COMMUNITY): Payer: Medicare Other | Admitting: Anesthesiology

## 2013-12-10 ENCOUNTER — Encounter (HOSPITAL_BASED_OUTPATIENT_CLINIC_OR_DEPARTMENT_OTHER): Payer: Self-pay | Admitting: Emergency Medicine

## 2013-12-10 ENCOUNTER — Inpatient Hospital Stay (HOSPITAL_COMMUNITY): Payer: Medicare Other | Admitting: Anesthesiology

## 2013-12-10 ENCOUNTER — Encounter (HOSPITAL_COMMUNITY)
Admission: EM | Disposition: A | Discharge status: Home / Self Care | Payer: Self-pay | Source: Home / Self Care | Attending: Orthopedic Surgery

## 2013-12-10 ENCOUNTER — Emergency Department (HOSPITAL_BASED_OUTPATIENT_CLINIC_OR_DEPARTMENT_OTHER): Payer: Medicare Other

## 2013-12-10 DIAGNOSIS — M79641 Pain in right hand: Secondary | ICD-10-CM | POA: Diagnosis present

## 2013-12-10 DIAGNOSIS — Z91040 Latex allergy status: Secondary | ICD-10-CM | POA: Diagnosis not present

## 2013-12-10 DIAGNOSIS — Z882 Allergy status to sulfonamides status: Secondary | ICD-10-CM | POA: Diagnosis not present

## 2013-12-10 DIAGNOSIS — F1021 Alcohol dependence, in remission: Secondary | ICD-10-CM | POA: Diagnosis not present

## 2013-12-10 DIAGNOSIS — T148 Other injury of unspecified body region: Secondary | ICD-10-CM

## 2013-12-10 DIAGNOSIS — K766 Portal hypertension: Secondary | ICD-10-CM | POA: Diagnosis present

## 2013-12-10 DIAGNOSIS — Z825 Family history of asthma and other chronic lower respiratory diseases: Secondary | ICD-10-CM | POA: Diagnosis not present

## 2013-12-10 DIAGNOSIS — E119 Type 2 diabetes mellitus without complications: Secondary | ICD-10-CM

## 2013-12-10 DIAGNOSIS — K219 Gastro-esophageal reflux disease without esophagitis: Secondary | ICD-10-CM | POA: Diagnosis present

## 2013-12-10 DIAGNOSIS — J309 Allergic rhinitis, unspecified: Secondary | ICD-10-CM | POA: Diagnosis not present

## 2013-12-10 DIAGNOSIS — F909 Attention-deficit hyperactivity disorder, unspecified type: Secondary | ICD-10-CM | POA: Diagnosis not present

## 2013-12-10 DIAGNOSIS — M545 Low back pain: Secondary | ICD-10-CM | POA: Diagnosis not present

## 2013-12-10 DIAGNOSIS — L0390 Cellulitis, unspecified: Secondary | ICD-10-CM | POA: Diagnosis present

## 2013-12-10 DIAGNOSIS — S61431A Puncture wound without foreign body of right hand, initial encounter: Secondary | ICD-10-CM | POA: Diagnosis present

## 2013-12-10 DIAGNOSIS — E1140 Type 2 diabetes mellitus with diabetic neuropathy, unspecified: Secondary | ICD-10-CM | POA: Diagnosis not present

## 2013-12-10 DIAGNOSIS — G894 Chronic pain syndrome: Secondary | ICD-10-CM | POA: Diagnosis not present

## 2013-12-10 DIAGNOSIS — Z23 Encounter for immunization: Secondary | ICD-10-CM | POA: Diagnosis not present

## 2013-12-10 DIAGNOSIS — Z823 Family history of stroke: Secondary | ICD-10-CM | POA: Diagnosis not present

## 2013-12-10 DIAGNOSIS — L03119 Cellulitis of unspecified part of limb: Secondary | ICD-10-CM

## 2013-12-10 DIAGNOSIS — I8500 Esophageal varices without bleeding: Secondary | ICD-10-CM

## 2013-12-10 DIAGNOSIS — I8510 Secondary esophageal varices without bleeding: Secondary | ICD-10-CM | POA: Diagnosis present

## 2013-12-10 DIAGNOSIS — S61451A Open bite of right hand, initial encounter: Secondary | ICD-10-CM | POA: Diagnosis present

## 2013-12-10 DIAGNOSIS — E785 Hyperlipidemia, unspecified: Secondary | ICD-10-CM

## 2013-12-10 DIAGNOSIS — Z87891 Personal history of nicotine dependence: Secondary | ICD-10-CM | POA: Diagnosis not present

## 2013-12-10 DIAGNOSIS — Z8249 Family history of ischemic heart disease and other diseases of the circulatory system: Secondary | ICD-10-CM | POA: Diagnosis not present

## 2013-12-10 DIAGNOSIS — B182 Chronic viral hepatitis C: Secondary | ICD-10-CM

## 2013-12-10 DIAGNOSIS — W540XXA Bitten by dog, initial encounter: Secondary | ICD-10-CM

## 2013-12-10 DIAGNOSIS — Z888 Allergy status to other drugs, medicaments and biological substances status: Secondary | ICD-10-CM | POA: Diagnosis not present

## 2013-12-10 DIAGNOSIS — Y939 Activity, unspecified: Secondary | ICD-10-CM | POA: Diagnosis not present

## 2013-12-10 DIAGNOSIS — Z881 Allergy status to other antibiotic agents status: Secondary | ICD-10-CM | POA: Diagnosis not present

## 2013-12-10 DIAGNOSIS — F319 Bipolar disorder, unspecified: Secondary | ICD-10-CM | POA: Diagnosis not present

## 2013-12-10 DIAGNOSIS — Z794 Long term (current) use of insulin: Secondary | ICD-10-CM | POA: Diagnosis not present

## 2013-12-10 DIAGNOSIS — F902 Attention-deficit hyperactivity disorder, combined type: Secondary | ICD-10-CM

## 2013-12-10 DIAGNOSIS — B1920 Unspecified viral hepatitis C without hepatic coma: Secondary | ICD-10-CM | POA: Diagnosis not present

## 2013-12-10 DIAGNOSIS — Z87442 Personal history of urinary calculi: Secondary | ICD-10-CM | POA: Diagnosis present

## 2013-12-10 DIAGNOSIS — L03113 Cellulitis of right upper limb: Secondary | ICD-10-CM | POA: Diagnosis present

## 2013-12-10 DIAGNOSIS — Z7982 Long term (current) use of aspirin: Secondary | ICD-10-CM | POA: Diagnosis not present

## 2013-12-10 DIAGNOSIS — Z8614 Personal history of Methicillin resistant Staphylococcus aureus infection: Secondary | ICD-10-CM | POA: Diagnosis not present

## 2013-12-10 DIAGNOSIS — S61259A Open bite of unspecified finger without damage to nail, initial encounter: Secondary | ICD-10-CM | POA: Diagnosis present

## 2013-12-10 DIAGNOSIS — I891 Lymphangitis: Secondary | ICD-10-CM | POA: Diagnosis not present

## 2013-12-10 DIAGNOSIS — R569 Unspecified convulsions: Secondary | ICD-10-CM

## 2013-12-10 DIAGNOSIS — K7030 Alcoholic cirrhosis of liver without ascites: Secondary | ICD-10-CM | POA: Diagnosis not present

## 2013-12-10 DIAGNOSIS — G40909 Epilepsy, unspecified, not intractable, without status epilepticus: Secondary | ICD-10-CM | POA: Diagnosis not present

## 2013-12-10 HISTORY — DX: Carrier or suspected carrier of methicillin resistant Staphylococcus aureus: Z22.322

## 2013-12-10 HISTORY — PX: I & D EXTREMITY: SHX5045

## 2013-12-10 HISTORY — PX: I&D EXTREMITY: SHX5045

## 2013-12-10 LAB — CBC WITH DIFFERENTIAL/PLATELET
BASOS PCT: 0 % (ref 0–1)
Basophils Absolute: 0 10*3/uL (ref 0.0–0.1)
EOS ABS: 0.2 10*3/uL (ref 0.0–0.7)
EOS PCT: 2 % (ref 0–5)
HCT: 38.9 % — ABNORMAL LOW (ref 39.0–52.0)
HEMOGLOBIN: 13.6 g/dL (ref 13.0–17.0)
Lymphocytes Relative: 16 % (ref 12–46)
Lymphs Abs: 2.1 10*3/uL (ref 0.7–4.0)
MCH: 30.8 pg (ref 26.0–34.0)
MCHC: 35 g/dL (ref 30.0–36.0)
MCV: 88.2 fL (ref 78.0–100.0)
MONOS PCT: 10 % (ref 3–12)
Monocytes Absolute: 1.4 10*3/uL — ABNORMAL HIGH (ref 0.1–1.0)
Neutro Abs: 9.7 10*3/uL — ABNORMAL HIGH (ref 1.7–7.7)
Neutrophils Relative %: 73 % (ref 43–77)
Platelets: 103 10*3/uL — ABNORMAL LOW (ref 150–400)
RBC: 4.41 MIL/uL (ref 4.22–5.81)
RDW: 13.8 % (ref 11.5–15.5)
WBC: 13.4 10*3/uL — ABNORMAL HIGH (ref 4.0–10.5)

## 2013-12-10 LAB — BASIC METABOLIC PANEL
Anion gap: 12 (ref 5–15)
BUN: 12 mg/dL (ref 6–23)
CALCIUM: 8.8 mg/dL (ref 8.4–10.5)
CHLORIDE: 98 meq/L (ref 96–112)
CO2: 26 meq/L (ref 19–32)
Creatinine, Ser: 0.8 mg/dL (ref 0.50–1.35)
GFR calc Af Amer: >90 mL/min (ref >90)
GFR calc non Af Amer: >90 mL/min (ref >90)
Glucose, Bld: 258 mg/dL — ABNORMAL HIGH (ref 70–99)
Potassium: 4 mEq/L (ref 3.7–5.3)
Sodium: 136 mEq/L — ABNORMAL LOW (ref 137–147)

## 2013-12-10 LAB — HEMOGLOBIN A1C
Hgb A1c MFr Bld: 10.4 % — ABNORMAL HIGH (ref <5.7)
Mean Plasma Glucose: 252 mg/dL — ABNORMAL HIGH (ref <117)

## 2013-12-10 LAB — GLUCOSE, CAPILLARY
GLUCOSE-CAPILLARY: 143 mg/dL — AB (ref 70–99)
Glucose-Capillary: 214 mg/dL — ABNORMAL HIGH (ref 70–99)
Glucose-Capillary: 229 mg/dL — ABNORMAL HIGH (ref 70–99)
Glucose-Capillary: 240 mg/dL — ABNORMAL HIGH (ref 70–99)
Glucose-Capillary: 202 mg/dL — ABNORMAL HIGH (ref 70–99)

## 2013-12-10 LAB — SEDIMENTATION RATE: SED RATE: 25 mm/h — AB (ref 0–16)

## 2013-12-10 SURGERY — IRRIGATION AND DEBRIDEMENT EXTREMITY
Anesthesia: General | Site: Hand | Laterality: Right

## 2013-12-10 MED ORDER — GABAPENTIN 300 MG PO CAPS
300.0000 mg | ORAL_CAPSULE | Freq: Every day | ORAL | Status: DC
  Administered 2013-12-10 – 2013-12-11 (×2): 300 mg via ORAL
  Filled 2013-12-10 (×3): qty 1

## 2013-12-10 MED ORDER — ONDANSETRON HCL 4 MG PO TABS: 4.0000 mg | ORAL_TABLET | Freq: Four times a day (QID) | ORAL | Status: DC | PRN

## 2013-12-10 MED ORDER — OXYCODONE HCL 5 MG PO TABS
5.0000 mg | ORAL_TABLET | ORAL | Status: DC | PRN
  Administered 2013-12-10 – 2013-12-12 (×8): 10 mg via ORAL
  Filled 2013-12-10 (×8): qty 2

## 2013-12-10 MED ORDER — PROMETHAZINE HCL 25 MG RE SUPP: 12.5000 mg | Freq: Four times a day (QID) | RECTAL | Status: DC | PRN

## 2013-12-10 MED ORDER — PROPOFOL 10 MG/ML IV BOLUS
INTRAVENOUS | Status: DC | PRN
  Administered 2013-12-10: 30 mg via INTRAVENOUS
  Administered 2013-12-10: 100 mg via INTRAVENOUS
  Administered 2013-12-10: 30 mg via INTRAVENOUS

## 2013-12-10 MED ORDER — ONDANSETRON HCL 4 MG/2ML IJ SOLN: 4.0000 mg | Freq: Four times a day (QID) | INTRAMUSCULAR | Status: DC | PRN

## 2013-12-10 MED ORDER — MORPHINE SULFATE 2 MG/ML IJ SOLN: 2.0000 mg | INTRAMUSCULAR | Status: DC | PRN

## 2013-12-10 MED ORDER — PANTOPRAZOLE SODIUM 40 MG PO TBEC
40.0000 mg | DELAYED_RELEASE_TABLET | Freq: Every day | ORAL | Status: DC
  Administered 2013-12-10 – 2013-12-12 (×3): 40 mg via ORAL
  Filled 2013-12-10 (×3): qty 1

## 2013-12-10 MED ORDER — INSULIN DETEMIR 100 UNIT/ML ~~LOC~~ SOLN
40.0000 [IU] | Freq: Every day | SUBCUTANEOUS | Status: DC
  Filled 2013-12-10: qty 0.4

## 2013-12-10 MED ORDER — AMPICILLIN-SULBACTAM SODIUM 3 (2-1) G IJ SOLR
3.0000 g | Freq: Once | INTRAMUSCULAR | Status: AC
  Administered 2013-12-10: 3 g via INTRAVENOUS
  Filled 2013-12-10: qty 3

## 2013-12-10 MED ORDER — SODIUM CHLORIDE 0.9 % IV SOLN
3.0000 g | Freq: Four times a day (QID) | INTRAVENOUS | Status: DC
  Administered 2013-12-10 – 2013-12-12 (×9): 3 g via INTRAVENOUS
  Filled 2013-12-10 (×15): qty 3

## 2013-12-10 MED ORDER — LACTULOSE 10 GM/15ML PO SOLN
10.0000 g | Freq: Every day | ORAL | Status: DC | PRN
  Filled 2013-12-10: qty 15

## 2013-12-10 MED ORDER — FAMOTIDINE 20 MG PO TABS
20.0000 mg | ORAL_TABLET | Freq: Two times a day (BID) | ORAL | Status: DC | PRN
  Filled 2013-12-10 (×2): qty 1

## 2013-12-10 MED ORDER — ONDANSETRON HCL 4 MG/2ML IJ SOLN
INTRAMUSCULAR | Status: DC | PRN
  Administered 2013-12-10: 4 mg via INTRAVENOUS

## 2013-12-10 MED ORDER — INSULIN ASPART 100 UNIT/ML ~~LOC~~ SOLN: 0.0000 [IU] | Freq: Every day | SUBCUTANEOUS | Status: DC

## 2013-12-10 MED ORDER — METHOCARBAMOL 1000 MG/10ML IJ SOLN: 500.0000 mg | Freq: Four times a day (QID) | INTRAVENOUS | Status: DC | PRN

## 2013-12-10 MED ORDER — SODIUM CHLORIDE 0.9 % IR SOLN
Status: DC | PRN
  Administered 2013-12-10: 3000 mL

## 2013-12-10 MED ORDER — SUCCINYLCHOLINE CHLORIDE 20 MG/ML IJ SOLN
INTRAMUSCULAR | Status: DC | PRN
  Administered 2013-12-10: 50 mg via INTRAVENOUS

## 2013-12-10 MED ORDER — METHOCARBAMOL 500 MG PO TABS: 500.0000 mg | ORAL_TABLET | Freq: Four times a day (QID) | ORAL | Status: DC | PRN

## 2013-12-10 MED ORDER — FENTANYL CITRATE 0.05 MG/ML IJ SOLN: 25.0000 ug | INTRAMUSCULAR | Status: DC | PRN

## 2013-12-10 MED ORDER — TETANUS-DIPHTH-ACELL PERTUSSIS 5-2.5-18.5 LF-MCG/0.5 IM SUSP
0.5000 mL | Freq: Once | INTRAMUSCULAR | Status: AC
  Administered 2013-12-10: 0.5 mL via INTRAMUSCULAR
  Filled 2013-12-10: qty 0.5

## 2013-12-10 MED ORDER — FENTANYL CITRATE 0.05 MG/ML IJ SOLN
INTRAMUSCULAR | Status: DC | PRN
  Administered 2013-12-10 (×2): 50 ug via INTRAVENOUS

## 2013-12-10 MED ORDER — KETOROLAC TROMETHAMINE 30 MG/ML IJ SOLN
30.0000 mg | Freq: Once | INTRAMUSCULAR | Status: AC
  Administered 2013-12-10: 30 mg via INTRAVENOUS
  Filled 2013-12-10: qty 1

## 2013-12-10 MED ORDER — ASPIRIN 325 MG PO TABS
325.0000 mg | ORAL_TABLET | Freq: Every day | ORAL | Status: DC
  Administered 2013-12-10 – 2013-12-12 (×3): 325 mg via ORAL
  Filled 2013-12-10 (×3): qty 1

## 2013-12-10 MED ORDER — VENLAFAXINE HCL ER 150 MG PO CP24
300.0000 mg | ORAL_CAPSULE | Freq: Every day | ORAL | Status: DC
  Administered 2013-12-11 – 2013-12-12 (×2): 300 mg via ORAL
  Filled 2013-12-10 (×3): qty 2

## 2013-12-10 MED ORDER — VANCOMYCIN HCL IN DEXTROSE 1-5 GM/200ML-% IV SOLN
1000.0000 mg | Freq: Once | INTRAVENOUS | Status: AC
  Administered 2013-12-10: 1000 mg via INTRAVENOUS
  Filled 2013-12-10: qty 200

## 2013-12-10 MED ORDER — SODIUM CHLORIDE 0.9 % IV SOLN
INTRAVENOUS | Status: DC | PRN
  Administered 2013-12-10: 05:00:00 via INTRAVENOUS

## 2013-12-10 MED ORDER — DOCUSATE SODIUM 100 MG PO CAPS
100.0000 mg | ORAL_CAPSULE | Freq: Two times a day (BID) | ORAL | Status: DC
  Administered 2013-12-10 – 2013-12-12 (×5): 100 mg via ORAL
  Filled 2013-12-10 (×6): qty 1

## 2013-12-10 MED ORDER — KCL IN DEXTROSE-NACL 20-5-0.2 MEQ/L-%-% IV SOLN
INTRAVENOUS | Status: DC
  Filled 2013-12-10: qty 1000

## 2013-12-10 MED ORDER — MORPHINE SULFATE ER 15 MG PO TBCR
15.0000 mg | EXTENDED_RELEASE_TABLET | Freq: Two times a day (BID) | ORAL | Status: DC
  Administered 2013-12-10 – 2013-12-12 (×5): 15 mg via ORAL
  Filled 2013-12-10 (×5): qty 1

## 2013-12-10 MED ORDER — INSULIN DETEMIR 100 UNIT/ML ~~LOC~~ SOLN
45.0000 [IU] | Freq: Every day | SUBCUTANEOUS | Status: DC
  Administered 2013-12-10 – 2013-12-11 (×2): 45 [IU] via SUBCUTANEOUS
  Filled 2013-12-10 (×3): qty 0.45

## 2013-12-10 MED ORDER — VENLAFAXINE HCL ER 150 MG PO CP24
150.0000 mg | ORAL_CAPSULE | Freq: Every day | ORAL | Status: DC
  Administered 2013-12-10: 150 mg via ORAL
  Filled 2013-12-10 (×2): qty 1

## 2013-12-10 MED ORDER — HEPARIN SODIUM (PORCINE) 5000 UNIT/ML IJ SOLN
5000.0000 [IU] | Freq: Three times a day (TID) | INTRAMUSCULAR | Status: DC
  Administered 2013-12-10 – 2013-12-11 (×3): 5000 [IU] via SUBCUTANEOUS
  Filled 2013-12-10 (×5): qty 1

## 2013-12-10 MED ORDER — INSULIN ASPART 100 UNIT/ML ~~LOC~~ SOLN
0.0000 [IU] | Freq: Three times a day (TID) | SUBCUTANEOUS | Status: DC
  Administered 2013-12-10 (×3): 5 [IU] via SUBCUTANEOUS
  Administered 2013-12-11: 3 [IU] via SUBCUTANEOUS
  Administered 2013-12-12: 5 [IU] via SUBCUTANEOUS

## 2013-12-10 MED ORDER — OXYCODONE HCL 5 MG PO TABS: 5.0000 mg | ORAL_TABLET | Freq: Once | ORAL | Status: DC | PRN

## 2013-12-10 MED ORDER — LIDOCAINE HCL (CARDIAC) 20 MG/ML IV SOLN
INTRAVENOUS | Status: DC | PRN
  Administered 2013-12-10: 70 mg via INTRAVENOUS

## 2013-12-10 MED ORDER — MIDAZOLAM HCL 2 MG/2ML IJ SOLN
INTRAMUSCULAR | Status: AC
  Filled 2013-12-10: qty 2

## 2013-12-10 MED ORDER — VITAMIN C 500 MG PO TABS
1000.0000 mg | ORAL_TABLET | Freq: Every day | ORAL | Status: DC
  Administered 2013-12-10 – 2013-12-12 (×3): 1000 mg via ORAL
  Filled 2013-12-10 (×3): qty 2

## 2013-12-10 MED ORDER — GABAPENTIN 300 MG PO CAPS
600.0000 mg | ORAL_CAPSULE | Freq: Two times a day (BID) | ORAL | Status: DC
  Administered 2013-12-10 – 2013-12-12 (×4): 600 mg via ORAL
  Filled 2013-12-10 (×6): qty 2

## 2013-12-10 MED ORDER — OXYCODONE HCL 5 MG/5ML PO SOLN: 5.0000 mg | Freq: Once | ORAL | Status: DC | PRN

## 2013-12-10 MED ORDER — PROPRANOLOL HCL 20 MG PO TABS
20.0000 mg | ORAL_TABLET | Freq: Two times a day (BID) | ORAL | Status: DC
  Administered 2013-12-10 – 2013-12-12 (×5): 20 mg via ORAL
  Filled 2013-12-10 (×6): qty 1

## 2013-12-10 MED ORDER — FENTANYL CITRATE 0.05 MG/ML IJ SOLN
INTRAMUSCULAR | Status: AC
  Filled 2013-12-10: qty 5

## 2013-12-10 MED ORDER — SPIRONOLACTONE 50 MG PO TABS
50.0000 mg | ORAL_TABLET | Freq: Every day | ORAL | Status: DC
  Administered 2013-12-10 – 2013-12-12 (×3): 50 mg via ORAL
  Filled 2013-12-10 (×3): qty 1

## 2013-12-10 SURGICAL SUPPLY — 45 items
BANDAGE ELASTIC 4 VELCRO ST LF (GAUZE/BANDAGES/DRESSINGS) ×3 IMPLANT
BNDG CONFORM 2 STRL LF (GAUZE/BANDAGES/DRESSINGS) IMPLANT
BNDG GAUZE ELAST 4 BULKY (GAUZE/BANDAGES/DRESSINGS) ×6 IMPLANT
CORDS BIPOLAR (ELECTRODE) ×3 IMPLANT
CUFF TOURNIQUET SINGLE 18IN (TOURNIQUET CUFF) ×3 IMPLANT
CUFF TOURNIQUET SINGLE 24IN (TOURNIQUET CUFF) IMPLANT
DRSG ADAPTIC 3X8 NADH LF (GAUZE/BANDAGES/DRESSINGS) ×6 IMPLANT
ELECT REM PT RETURN 9FT ADLT (ELECTROSURGICAL) ×3
ELECTRODE REM PT RTRN 9FT ADLT (ELECTROSURGICAL) ×1 IMPLANT
GAUZE SPONGE 4X4 12PLY STRL (GAUZE/BANDAGES/DRESSINGS) ×3 IMPLANT
GAUZE XEROFORM 1X8 LF (GAUZE/BANDAGES/DRESSINGS) ×3 IMPLANT
GLOVE BIOGEL M STRL SZ7.5 (GLOVE) IMPLANT
GLOVE SS BIOGEL STRL SZ 8 (GLOVE) IMPLANT
GLOVE SUPERSENSE BIOGEL SZ 8 (GLOVE)
GOWN STRL REUS W/ TWL LRG LVL3 (GOWN DISPOSABLE) IMPLANT
GOWN STRL REUS W/ TWL XL LVL3 (GOWN DISPOSABLE) ×2 IMPLANT
GOWN STRL REUS W/TWL LRG LVL3 (GOWN DISPOSABLE)
GOWN STRL REUS W/TWL XL LVL3 (GOWN DISPOSABLE) ×4
HANDPIECE INTERPULSE COAX TIP (DISPOSABLE)
KIT BASIN OR (CUSTOM PROCEDURE TRAY) ×3 IMPLANT
KIT ROOM TURNOVER OR (KITS) ×3 IMPLANT
LOOP VESSEL MAXI BLUE (MISCELLANEOUS) ×3 IMPLANT
MANIFOLD NEPTUNE II (INSTRUMENTS) ×3 IMPLANT
NEEDLE HYPO 25GX1X1/2 BEV (NEEDLE) IMPLANT
NS IRRIG 1000ML POUR BTL (IV SOLUTION) ×3 IMPLANT
PACK ORTHO EXTREMITY (CUSTOM PROCEDURE TRAY) ×3 IMPLANT
PAD ARMBOARD 7.5X6 YLW CONV (MISCELLANEOUS) ×6 IMPLANT
PAD CAST 4YDX4 CTTN HI CHSV (CAST SUPPLIES) ×1 IMPLANT
PADDING CAST ABS 4INX4YD NS (CAST SUPPLIES) ×2
PADDING CAST ABS COTTON 4X4 ST (CAST SUPPLIES) ×1 IMPLANT
PADDING CAST COTTON 4X4 STRL (CAST SUPPLIES) ×2
SET CYSTO W/LG BORE CLAMP LF (SET/KITS/TRAYS/PACK) ×3 IMPLANT
SET HNDPC FAN SPRY TIP SCT (DISPOSABLE) IMPLANT
SPLINT FIBERGLASS 3X12 (CAST SUPPLIES) ×3 IMPLANT
SPONGE GAUZE 4X4 12PLY STER LF (GAUZE/BANDAGES/DRESSINGS) ×3 IMPLANT
SPONGE LAP 18X18 X RAY DECT (DISPOSABLE) ×3 IMPLANT
SPONGE LAP 4X18 X RAY DECT (DISPOSABLE) ×3 IMPLANT
SYR CONTROL 10ML LL (SYRINGE) IMPLANT
TOWEL OR 17X24 6PK STRL BLUE (TOWEL DISPOSABLE) ×3 IMPLANT
TOWEL OR 17X26 10 PK STRL BLUE (TOWEL DISPOSABLE) ×3 IMPLANT
TUBE ANAEROBIC SPECIMEN COL (MISCELLANEOUS) ×3 IMPLANT
TUBE CONNECTING 12'X1/4 (SUCTIONS) ×1
TUBE CONNECTING 12X1/4 (SUCTIONS) ×2 IMPLANT
WATER STERILE IRR 1000ML POUR (IV SOLUTION) IMPLANT
YANKAUER SUCT BULB TIP NO VENT (SUCTIONS) ×3 IMPLANT

## 2013-12-10 NOTE — Anesthesia Procedure Notes (Signed)
Procedure Name: Intubation Date/Time: 12/10/2013 4:57 AM Performed by: Luster LandsbergHASE, Tejas Seawood R Pre-anesthesia Checklist: Patient identified, Emergency Drugs available, Suction available and Patient being monitored Patient Re-evaluated:Patient Re-evaluated prior to inductionOxygen Delivery Method: Circle system utilized Preoxygenation: Pre-oxygenation with 100% oxygen Intubation Type: IV induction, Rapid sequence and Cricoid Pressure applied Laryngoscope Size: Mac and 3 Grade View: Grade I Tube type: Oral Tube size: 7.5 mm Number of attempts: 1 Airway Equipment and Method: Stylet Placement Confirmation: ETT inserted through vocal cords under direct vision,  positive ETCO2 and breath sounds checked- equal and bilateral Secured at: 24 cm Tube secured with: Tape Dental Injury: Teeth and Oropharynx as per pre-operative assessment

## 2013-12-10 NOTE — Progress Notes (Signed)
Utilization review completed.  

## 2013-12-10 NOTE — Anesthesia Postprocedure Evaluation (Signed)
  Anesthesia Post-op Note  Patient: Kurt Reid  Procedure(s) Performed: Procedure(s): IRRIGATION AND DEBRIDEMENT OF MULTIPLE DOG BITES RIGHT HAND AND WRIST (Right)  Patient Location: PACU  Anesthesia Type:General  Level of Consciousness: awake  Airway and Oxygen Therapy: Patient Spontanous Breathing and Patient connected to nasal cannula oxygen  Post-op Pain: mild  Post-op Assessment: Post-op Vital signs reviewed, Patient's Cardiovascular Status Stable, Respiratory Function Stable, Patent Airway, No signs of Nausea or vomiting and Pain level controlled  Post-op Vital Signs: Reviewed and stable  Last Vitals:  Filed Vitals:   12/10/13 0635  BP: 114/72  Pulse: 93  Temp: 37.7 C  Resp: 18    Complications: No apparent anesthesia complications

## 2013-12-10 NOTE — Anesthesia Preprocedure Evaluation (Addendum)
Anesthesia Evaluation  Patient identified by MRN, date of birth, ID band Patient awake    Reviewed: Allergy & Precautions, H&P , NPO status , Patient's Chart, lab work & pertinent test results  History of Anesthesia Complications Negative for: history of anesthetic complications  Airway Mallampati: II TM Distance: >3 FB     Dental  (+) Edentulous Upper, Edentulous Lower   Pulmonary former smoker,  breath sounds clear to auscultation        Cardiovascular negative cardio ROS  Rhythm:Regular     Neuro/Psych  Headaches, PSYCHIATRIC DISORDERS Anxiety Depression Bipolar Disorder  Neuromuscular disease    GI/Hepatic GERD-  Medicated and Controlled,(+) Cirrhosis -      , Hepatitis -, CSlightly altered and endorses lack of lactulose lately   Endo/Other  diabetes, Type 2, Insulin Dependent, Oral Hypoglycemic Agents  Renal/GU Renal disease     Musculoskeletal   Abdominal   Peds  Hematology   Anesthesia Other Findings   Reproductive/Obstetrics                          Anesthesia Physical Anesthesia Plan  ASA: III and emergent  Anesthesia Plan: General   Post-op Pain Management:    Induction: Intravenous, Rapid sequence and Cricoid pressure planned  Airway Management Planned: Oral ETT  Additional Equipment: None  Intra-op Plan:   Post-operative Plan: Extubation in OR  Informed Consent: I have reviewed the patients History and Physical, chart, labs and discussed the procedure including the risks, benefits and alternatives for the proposed anesthesia with the patient or authorized representative who has indicated his/her understanding and acceptance.   Dental advisory given  Plan Discussed with: CRNA and Surgeon  Anesthesia Plan Comments:         Anesthesia Quick Evaluation

## 2013-12-10 NOTE — ED Provider Notes (Signed)
CSN: [id number]     Arrival date & time 12/09/13  2243 History   First MD Initiated Contact with Patient 12/10/13 0058     Chief Complaint  Patient presents with  . Animal Bite     (Consider location/radiation/quality/duration/timing/severity/associated sxs/prior Treatment) Patient is a 63 y.o. male presenting with animal bite. The history is provided by the patient.  Animal Bite Contact animal:  Dog Location:  Hand Hand injury location:  R hand and dorsum of R hand Time since incident:  2 days Pain details:    Quality:  Aching   Severity:  Moderate   Timing:  Constant   Progression:  Unchanged Incident location:  Home Provoked: unprovoked   Notifications:  None Animal's rabies vaccination status:  Up to date Animal in possession: yes   Tetanus status:  Out of date Relieved by:  Nothing Worsened by:  Nothing tried Ineffective treatments:  None tried Associated symptoms: fever     Past Medical History  Diagnosis Date  . Depression   . Bipolar 1 disorder   . ADHD (attention deficit hyperactivity disorder)   . GERD (gastroesophageal reflux disease)   . Hepatitis C   . Hyperlipidemia   . Low back pain     chronic  . Allergic rhinitis   . Seizure disorder     H/O  . Benign prostatic hypertrophy   . Migraine   . ETOH abuse     hx  . Nephrolithiasis     hx  . Sinusitis     recurrent  . Diverticulosis of colon   . Anxiety   . ADHD 09/23/2006  . ALLERGIC RHINITIS 09/23/2006  . ANXIETY 04/19/2009  . BENIGN PROSTATIC HYPERTROPHY 11/02/2007  . BIPOLAR AFFECTIVE DISORDER 09/23/2006  . Chronic pain syndrome 01/18/2009  . DEPRESSION 09/23/2006  . DIVERTICULOSIS, COLON 04/19/2009  . ESOPHAGEAL VARICES 04/19/2009  . GERD 09/23/2006  . HEPATITIS C 09/23/2006  . HYPERLIPIDEMIA 09/23/2006  . INSOMNIA-SLEEP DISORDER-UNSPEC 04/19/2009  . LOW BACK PAIN 09/23/2006  . NEPHROLITHIASIS, HX OF 11/02/2007  . SEIZURE DISORDER 09/23/2006  . 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 Diabetes mellitus without complication    Past Surgical History  Procedure Laterality Date  . Back surgury      x 5  . Knee surgury      x 1  . Cheekbone      hx of fx  . Tonsillectomy    . S/p sinus surgury  2010    Dr. Zara Chess   Family History  Problem Relation Age of Onset  . Stroke Mother   . Hyperlipidemia Mother   . Depression Mother   . Hypertension Mother   . Asthma Daughter   . Dementia Father    History  Substance Use Topics  . Smoking status: Former Games developer  . Smokeless tobacco: Never Used  . Alcohol Use: No    Review of Systems  Constitutional: Positive for fever.  Skin: Positive for pallor and wound.  All other systems reviewed and are negative.     Allergies  Duloxetine; Latex; Levofloxacin; Mercury; and Sulfonamide derivatives  Home Medications   Prior to Admission medications   Medication Sig Start Date End Date Taking? Authorizing Provider  aspirin (ASPIRIN EC) 81 MG EC tablet Take 81 mg by mouth daily.      Historical Provider, MD  B-D UF III MINI PEN NEEDLES 31G X 5 MM MISC USE AS DIRECTED ONCE DAILY    Len Blalock  John, MD  fluticasone (FLONASE) 50 MCG/ACT nasal spray 2 sprays by Nasal route daily. 07/23/10 04/13/13  Corwin LevinsJames W John, MD  gabapentin (NEURONTIN) 300 MG capsule TAKE 2 CAPSULES BY MOUTH 3 TIMES A DAY 02/28/13   Corwin LevinsJames W John, MD  glipiZIDE (GLUCOTROL XL) 10 MG 24 hr tablet TAKE 1 TABLET BY MOUTH TWICE DAILY    Corwin LevinsJames W John, MD  glucose blood (ONE TOUCH ULTRA TEST) test strip USE AS DIRECTED three times daily 250.02 10/07/13   Corwin LevinsJames W John, MD  HYDROcodone-acetaminophen (NORCO) 10-325 MG per tablet TAKE 1 TABLET BY MOUTH EVERY 6 HOURS AS NEEDED FOR PAIN - to fill Oct 09, 2013 11/02/13   Corwin LevinsJames W John, MD  Insulin Detemir (LEVEMIR FLEXPEN) 100 UNIT/ML Pen Use as directed 40 units at bedtime. 10/07/13   Corwin LevinsJames W John, MD  lactulose (CHRONULAC) 10 GM/15ML solution Take 30 mLs (20 g total) by mouth daily. 04/13/13   Corwin LevinsJames W John, MD  Lancets  Misc. (ONE TOUCH SURESOFT) MISC Use as directed once per day 11/10/12   Corwin LevinsJames W John, MD  Lancets MISC Use as directed three times per day 250.02 10/07/13   Corwin LevinsJames W John, MD  metFORMIN (GLUCOPHAGE-XR) 500 MG 24 hr tablet TAKE 4 TABLETS BY MOUTH IN THE MORNING    Corwin LevinsJames W John, MD  morphine (MS CONTIN) 30 MG 12 hr tablet Take 15 mg by mouth 2 (two) times daily. To fill sept 8, 2015 11/02/13   Corwin LevinsJames W John, MD  omeprazole (PRILOSEC) 20 MG capsule TAKE ONE CAPSULE BY MOUTH EVERY DAY 12/25/12   Corwin LevinsJames W John, MD  propranolol (INDERAL) 20 MG tablet Take 20 mg by mouth 2 (two) times daily.    Historical Provider, MD  spironolactone (ALDACTONE) 50 MG tablet Take 50 mg by mouth daily.    Historical Provider, MD  venlafaxine XR (EFFEXOR-XR) 150 MG 24 hr capsule TAKE 2 CAPSULES BY MOUTH EVERY MORNING 03/19/13   Corwin LevinsJames W John, MD   BP 126/78  Pulse 92  Temp(Src) 101.2 F (38.4 C) (Oral)  Resp 20  Ht 6' (1.829 m)  Wt 206 lb (93.441 kg)  BMI 27.93 kg/m2  SpO2 100% Physical Exam  Constitutional: He is oriented to person, place, and time. He appears well-developed and well-nourished. No distress.  HENT:  Head: Normocephalic and atraumatic.  Mouth/Throat: Oropharynx is clear and moist.  Eyes: Conjunctivae are normal. Pupils are equal, round, and reactive to light.  Neck: Normal range of motion. Neck supple.  Cardiovascular: Normal rate, regular rhythm and intact distal pulses.   Pulmonary/Chest: Effort normal and breath sounds normal. He has no wheezes. He has no rales.  Abdominal: Soft. Bowel sounds are normal. There is no tenderness. There is no rebound and no guarding.  Musculoskeletal: Normal range of motion.       Right hand: He exhibits tenderness and swelling. He exhibits no bony tenderness and normal two-point discrimination. Normal sensation noted. Normal strength noted.       Hands: Neurological: He is alert and oriented to person, place, and time.  Skin: Skin is warm and dry. There is erythema.   Psychiatric: He has a normal mood and affect.    ED Course  Procedures (including critical care time) Labs Review Labs Reviewed  CBC WITH DIFFERENTIAL  BASIC METABOLIC PANEL    Imaging Review No results found.   EKG Interpretation None      MDM   Final diagnoses:  None    Results for orders  placed during the hospital encounter of 12/10/13  CBC WITH DIFFERENTIAL      Result Value Ref Range   WBC 13.4 (*) 4.0 - 10.5 K/uL   RBC 4.41  4.22 - 5.81 MIL/uL   Hemoglobin 13.6  13.0 - 17.0 g/dL   HCT 16.138.9 (*) 09.639.0 - 04.552.0 %   MCV 88.2  78.0 - 100.0 fL   MCH 30.8  26.0 - 34.0 pg   MCHC 35.0  30.0 - 36.0 g/dL   RDW 40.913.8  81.111.5 - 91.415.5 %   Platelets 103 (*) 150 - 400 K/uL   Neutrophils Relative % 73  43 - 77 %   Neutro Abs 9.7 (*) 1.7 - 7.7 K/uL   Lymphocytes Relative 16  12 - 46 %   Lymphs Abs 2.1  0.7 - 4.0 K/uL   Monocytes Relative 10  3 - 12 %   Monocytes Absolute 1.4 (*) 0.1 - 1.0 K/uL   Eosinophils Relative 2  0 - 5 %   Eosinophils Absolute 0.2  0.0 - 0.7 K/uL   Basophils Relative 0  0 - 1 %   Basophils Absolute 0.0  0.0 - 0.1 K/uL   WBC Morphology WHITE COUNT CONFIRMED ON SMEAR     Smear Review PLATELET COUNT CONFIRMED BY SMEAR    BASIC METABOLIC PANEL      Result Value Ref Range   Sodium 136 (*) 137 - 147 mEq/L   Potassium 4.0  3.7 - 5.3 mEq/L   Chloride 98  96 - 112 mEq/L   CO2 26  19 - 32 mEq/L   Glucose, Bld 258 (*) 70 - 99 mg/dL   BUN 12  6 - 23 mg/dL   Creatinine, Ser 7.820.80  0.50 - 1.35 mg/dL   Calcium 8.8  8.4 - 95.610.5 mg/dL   GFR calc non Af Amer >90  >90 mL/min   GFR calc Af Amer >90  >90 mL/min   Anion gap 12  5 - 15   Dg Hand Complete Right  12/10/2013   CLINICAL DATA:  Dog bite to hand 3 days ago. Right hand pain, swelling, warmth, and erythema. Limited range of motion. Initial encounter.  EXAM: RIGHT HAND - COMPLETE 3+ VIEW  COMPARISON:  None.  FINDINGS: Soft tissue swelling is present over the dorsum of the hand. No acute osseous abnormality is  present. No radiopaque foreign body is present. The joints are located.  IMPRESSION: Soft tissue swelling over the dorsum of the hand without an acute osseous abnormality or radiopaque foreign body.   Electronically Signed   By: Gennette Pachris  Mattern M.D.   On: 12/10/2013 02:15      Date: 12/10/2013  Rate: 92  Rhythm: normal sinus rhythm  QRS Axis: normal  Intervals: normal  ST/T Wave abnormalities: normal  Conduction Disutrbances:right bundle branch block  Narrative Interpretation:   Old EKG Reviewed: none available  [facility]  Case d/w Dr. Amanda PeaGramig: sed rate ekg npo chest xray to the ED for go to the OR admit to medicine  Dr. Rhunette CroftNanavati made aware patient stopping in the ED  322 case d/w Dr. Allena KatzPatel admit to full inpatient med surg will be seen post OR.        Jasmine AweApril K Redell Bhandari-Rasch, MD 12/10/13 [phone]

## 2013-12-10 NOTE — Consult Note (Signed)
Patient Demographics  Kurt Reid, is a 63 y.o. male   MRN: [id number]   DOB - Feb 10, 1951  Admit Date - 12/10/2013    Outpatient Primary MD for the patient is Oliver BarreJames John, MD  Consult requested in the Hospital by Dominica SeverinWilliam Gramig, MD, On 12/10/2013    Reason for consult management of diabetes   With History of -  Past Medical History  Diagnosis Date  . Depression   . Bipolar 1 disorder   . ADHD (attention deficit hyperactivity disorder)   . GERD (gastroesophageal reflux disease)   . Hepatitis C   . Hyperlipidemia   . Low back pain     chronic  . Allergic rhinitis   . Seizure disorder     H/O  . Benign prostatic hypertrophy   . Migraine   . ETOH abuse     hx  . Nephrolithiasis     hx  . Sinusitis     recurrent  . Diverticulosis of colon   . Anxiety   . ADHD 09/23/2006  . ALLERGIC RHINITIS 09/23/2006  . ANXIETY 04/19/2009  . BENIGN PROSTATIC HYPERTROPHY 11/02/2007  . BIPOLAR AFFECTIVE DISORDER 09/23/2006  . Chronic pain syndrome 01/18/2009  . DEPRESSION 09/23/2006  . DIVERTICULOSIS, COLON 04/19/2009  . ESOPHAGEAL VARICES 04/19/2009  . GERD 09/23/2006  . HEPATITIS C 09/23/2006  . HYPERLIPIDEMIA 09/23/2006  . INSOMNIA-SLEEP DISORDER-UNSPEC 04/19/2009  . LOW BACK PAIN 09/23/2006  . NEPHROLITHIASIS, HX OF 11/02/2007  . SEIZURE DISORDER 09/23/2006  . 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 Diabetes mellitus without complication   . MRSA (methicillin resistant staph aureus) culture positive       Past Surgical History  Procedure Laterality Date  . Back surgury      x 5  . Knee surgury      x 1  . Cheekbone      hx of fx  . Tonsillectomy    . S/p sinus surgury  [place]   Chief Complaint  Patient presents with  . Animal Bite     HPI  Kurt Reid  is a 63 y.o.  male, with history of hepatitis C with cirrhosis, portal hypertension with esophageal varices, history of alcohol abuse quit several years ago, seizure disorder, type 2 diabetes mellitus now insulin-dependent, ADHD, GERD, chronic back pain, anxiety, bipolar disorder, kidney stones, dyslipidemia who was at his home and had an accidental fully vaccinated pet dog bite to his right arm 2 days ago. He started developing some redness pain and swelling in that arm yesterday and came to the ER. In the ER he was diagnosed with infected dog bite wound with right arm cellulitis, he received tetanus shot, blood cultures were done and hand surgery was called. He was seen by hand surgeon Dr. Amanda PeaGramig and taken to the OR and underwent incision and drainage. He was admitted by hand surgery hospitalist team was consulted to manage his diabetes mellitus type  2. Patient currently symptom free except for some postop right arm discomfort.    Review of Systems    In addition to the HPI above,   No Fever-chills, No Headache, No changes with Vision or hearing, No problems swallowing food or Liquids, No Chest pain, Cough or Shortness of Breath, No Abdominal pain, No Nausea or Vommitting, Bowel movements are regular, No Blood in stool or Urine, No dysuria, No new skin rashes or bruises, No new joints pains-aches, except some right arm pain No new weakness, tingling, numbness in any extremity, No recent weight gain or loss, No polyuria, polydypsia or polyphagia, No significant Mental Stressors.  A full 10 point Review of Systems was done, except as stated above, all other Review of Systems were negative.   Social History History  Substance Use Topics  . Smoking status: Former Games developer  . Smokeless tobacco: Never Used  . Alcohol Use: No     Family History Family History  Problem Relation Age of Onset  . Stroke Mother   . Hyperlipidemia Mother   . Depression Mother   . Hypertension Mother   . Asthma Daughter     . Dementia Father       Prior to Admission medications   Medication Sig Start Date End Date Taking? Authorizing Provider  aspirin (ASPIRIN EC) 81 MG EC tablet Take 81 mg by mouth daily.     Yes Historical Provider, MD  B-D UF III MINI PEN NEEDLES 31G X 5 MM MISC USE AS DIRECTED ONCE DAILY   Yes [name], MD  gabapentin (NEURONTIN) 300 MG capsule Take 600 mg by mouth 2 (two) times daily.   Yes Historical Provider, MD  glipiZIDE (GLUCOTROL XL) 10 MG 24 hr tablet Take 10 mg by mouth 2 (two) times daily.   Yes Historical Provider, MD  glucose blood (ONE TOUCH ULTRA TEST) test strip USE AS DIRECTED three times daily 250.02 10/07/13  Yes [name], MD  HYDROcodone-acetaminophen [facility]) 10-325 MG per tablet Take 1 tablet by mouth 3 (three) times daily as needed for severe pain.   Yes Historical Provider, MD  Insulin Detemir (LEVEMIR FLEXPEN) 100 UNIT/ML Pen Use as directed 40 units at bedtime. 10/07/13  Yes [name], MD  lactulose [facility]) 10 GM/15ML solution Take 10 g by mouth daily as needed for mild constipation or moderate constipation.   Yes Historical Provider, MD  Lancets Misc. (ONE TOUCH SURESOFT) MISC Use as directed once per day 11/10/12  Yes [name], MD  Lancets MISC Use as directed three times per day 250.02 10/07/13  Yes [name], MD  metFORMIN (GLUMETZA) 500 MG (MOD) 24 hr tablet Take 2,000 mg by mouth daily with breakfast.   Yes Historical Provider, MD  morphine (MS CONTIN) 30 MG 12 hr tablet Take 15 mg by mouth 2 (two) times daily. To fill sept 8, 2015 11/02/13  Yes [name], MD  omeprazole (PRILOSEC) 20 MG capsule Take 20 mg by mouth daily.   Yes Historical Provider, MD  propranolol (INDERAL) 20 MG tablet Take 20 mg by mouth 2 (two) times daily.   Yes Historical Provider, MD  spironolactone (ALDACTONE) 50 MG tablet Take 50 mg by mouth daily.   Yes Historical Provider, MD  venlafaxine XR (EFFEXOR-XR) 150 MG 24 hr capsule Take 300 mg by mouth daily with breakfast.    Yes Historical Provider, MD  fluticasone (FLONASE) 50 MCG/ACT nasal spray 2 sprays by Nasal route daily. 07/23/10 04/13/13  [name], MD    Anti-infectives   Start     Dose/Rate Route Frequency Ordered Stop   12/10/13 0600  Ampicillin-Sulbactam (UNASYN) 3 g in sodium chloride 0.9 % 100 mL IVPB     3 g 100 mL/hr over 60 Minutes Intravenous Every 6 hours 12/10/13 0543     12/10/13 0145  [MAR Hold]  vancomycin (VANCOCIN) IVPB 1000 mg/200 mL premix     (On MAR Hold since 12/10/13 0508)   1,000 mg 200 mL/hr over 60 Minutes Intravenous  Once 12/10/13 0131 12/10/13 0511   12/10/13 0115  Ampicillin-Sulbactam (UNASYN) 3 g in sodium chloride 0.9 % 100 mL IVPB     3 g 100 mL/hr over 60 Minutes Intravenous  Once 12/10/13 0113 12/10/13 0307      Scheduled Meds: . ampicillin-sulbactam (UNASYN) IV  3 g Intravenous Q6H  . aspirin  325 mg Oral Daily  . docusate sodium  100 mg Oral BID  . gabapentin  300 mg Oral QHS  . insulin aspart  0-15 Units Subcutaneous TID WC  . insulin aspart  0-5 Units Subcutaneous QHS  . insulin detemir  45 Units Subcutaneous QHS  . morphine  15 mg Oral BID  . propranolol  20 mg Oral BID  . spironolactone  50 mg Oral Daily  . venlafaxine XR  150 mg Oral Q breakfast  . vitamin C  1,000 mg Oral Daily   Continuous Infusions:  PRN Meds:.famotidine, morphine injection, ondansetron (ZOFRAN) IV, oxyCODONE, promethazine  Allergies  Allergen Reactions  . Duloxetine     REACTION: memory dysfunction, dizziness  . Latex Other (See Comments)    Reaction unknown  . Levofloxacin Nausea Only  . Mercury Other (See Comments)    Reaction unknown  . Sulfonamide Derivatives Other (See Comments)    Reaction unknown    Physical Exam  Vitals  Blood pressure 121/74, pulse 89, temperature 99.9 F (37.7 C), temperature source Oral, resp. rate 18, height 6\' 1"  (1.854 m), weight 92.987 kg (205 lb), SpO2 97.00%.   1. General middle-aged white male lying in bed in NAD,     2.  Normal affect and insight, Not Suicidal or Homicidal, Awake Alert, Oriented X 3.  3. No F.N deficits, ALL C.Nerves Intact, Strength 5/5 all 4 extremities, Sensation intact all 4 extremities, Plantars down going.  4. Ears and Eyes appear Normal, Conjunctivae clear, PERRLA. Moist Oral Mucosa.  5. Supple Neck, No JVD, No cervical lymphadenopathy appriciated, No Carotid Bruits.  6. Symmetrical Chest wall movement, Good air movement bilaterally, CTAB.  7. RRR, No Gallops, Rubs or Murmurs, No Parasternal Heave.  8. Positive Bowel Sounds, Abdomen Soft, No tenderness, No organomegaly appriciated,No rebound -guarding or rigidity.  9.  No Cyanosis, Normal Skin Turgor, No Skin Rash or Bruise. Right arm under bandage  10. Good muscle tone,  joints appear normal , no effusions, Normal ROM.  11. No Palpable Lymph Nodes in Neck or Axillae     Data Review  CBC  Recent Labs Lab 12/10/13 0115  WBC 13.4*  HGB 13.6  HCT 38.9*  PLT 103*  MCV 88.2  MCH 30.8  MCHC 35.0  RDW 13.8  LYMPHSABS 2.1  MONOABS 1.4*  EOSABS 0.2  BASOSABS 0.0   ------------------------------------------------------------------------------------------------------------------  Chemistries   Recent Labs Lab 12/10/13 0115  NA 136*  K 4.0  CL 98  CO2 26  GLUCOSE 258*  BUN 12  CREATININE 0.80  CALCIUM 8.8   ------------------------------------------------------------------------------------------------------------------ estimated creatinine clearance is 106.8  ml/min (by C-G formula based on Cr of 0.8). ------------------------------------------------------------------------------------------------------------------ No results found for this basename: TSH, T4TOTAL, FREET3, T3FREE, THYROIDAB,  in the last 72 hours   Coagulation profile No results found for this basename: INR, PROTIME,  in the last 168  hours ------------------------------------------------------------------------------------------------------------------- No results found for this basename: DDIMER,  in the last 72 hours -------------------------------------------------------------------------------------------------------------------  Cardiac Enzymes No results found for this basename: CK, CKMB, TROPONINI, MYOGLOBIN,  in the last 168 hours ------------------------------------------------------------------------------------------------------------------ No components found with this basename: POCBNP,    ---------------------------------------------------------------------------------------------------------------  Urinalysis    Component Value Date/Time   COLORURINE YELLOW 10/05/2013 1027   APPEARANCEUR CLEAR 10/05/2013 1027   LABSPEC 1.010 10/05/2013 1027   PHURINE 6.0 10/05/2013 1027   GLUCOSEU >=1000* 10/05/2013 1027   HGBUR NEGATIVE 10/05/2013 1027   BILIRUBINUR NEGATIVE 10/05/2013 1027   KETONESUR NEGATIVE 10/05/2013 1027   UROBILINOGEN 4.0* 10/05/2013 1027   NITRITE NEGATIVE 10/05/2013 1027   LEUKOCYTESUR NEGATIVE 10/05/2013 1027     Imaging results:   Dg Chest 2 View  12/10/2013   CLINICAL DATA:  Animal bite  EXAM: CHEST  2 VIEW  COMPARISON:  Prior radiograph from 06/03/2013  FINDINGS: The cardiac and mediastinal silhouettes are stable in size and contour, and remain within normal limits.  The lungs are normally inflated. No airspace consolidation, pleural effusion, or pulmonary edema is identified. There is no pneumothorax.  No acute osseous abnormality identified.  IMPRESSION: No active cardiopulmonary disease.   Electronically Signed   By: Rise MuBenjamin  McClintock M.D.   On: 12/10/2013 03:52   Dg Hand Complete Right  12/10/2013   CLINICAL DATA:  Dog bite to hand 3 days ago. Right hand pain, swelling, warmth, and erythema. Limited range of motion. Initial encounter.  EXAM: RIGHT HAND - COMPLETE 3+ VIEW  COMPARISON:  None.   FINDINGS: Soft tissue swelling is present over the dorsum of the hand. No acute osseous abnormality is present. No radiopaque foreign body is present. The joints are located.  IMPRESSION: Soft tissue swelling over the dorsum of the hand without an acute osseous abnormality or radiopaque foreign body.   Electronically Signed   By: Gennette Pachris  Mattern M.D.   On: 12/10/2013 02:15    My personal review of EKG: Rhythm NSR, Rate  97 /min, RBBB, no Acute ST changes    Assessment & Plan   1. R. arm cellulitis post dog bite. Status post Incision and drainage in the service his hand surgery, currently on IV Unasyn, continue antibiotic follow cultures. Defer management of this problem to primary team which is hand surgery. He received tetanus shot in the ER, dog was adequately vaccinated and bite was purely accidental.    2.DM 2 - on Lantus and sliding scale, will monitor and adjust as needed.   Lab Results  Component Value Date   HGBA1C 12.5* 10/05/2013    CBG (last 3)   Recent Labs  12/10/13 0541 12/10/13 0628  GLUCAP 229* 214*    3. History of diabetic neuropathy and seizures. Currently on Neurontin continue.   4. Hep C and alcoholic cirrhosis with portal hypertension and esophageal varices. Currently stable, no acute issues, continue combination of lactulose, Inderal and Aldactone.   5. GERD. On PPI.   6. Chronic back pain. On MS Contin.       DVT Prophylaxis Heparin   AM Labs Ordered, also please review Full Orders  Family Communication: Plan discussed with patient and wife   Thank you for the consult, we will follow  the patient with you in the Hospital.   Susa Raring K M.D on 12/10/2013 at 11:39 AM  Between 7am to 7pm - Pager - [phone]  After 7pm go to [email or web address] - password [email or web address]  And look for the night coverage person covering me after hours   Thank you for the consult, we will follow the patient with you in the Hospital.   [facility]  Group Office  [phone]

## 2013-12-10 NOTE — Op Note (Signed)
See Dictaion# [id number] Amanda PeaGramig MD

## 2013-12-10 NOTE — Transfer of Care (Signed)
Immediate Anesthesia Transfer of Care Note  Patient: Kurt Reid  Procedure(s) Performed: Procedure(s): IRRIGATION AND DEBRIDEMENT OF MULTIPLE DOG BITES RIGHT HAND AND WRIST (Right)  Patient Location: PACU  Anesthesia Type:General  Level of Consciousness: awake  Airway & Oxygen Therapy: Patient Spontanous Breathing and Patient connected to nasal cannula oxygen  Post-op Assessment: Report given to PACU RN, Post -op Vital signs reviewed and stable and Patient moving all extremities  Post vital signs: Reviewed and stable  Complications: No apparent anesthesia complications

## 2013-12-10 NOTE — H&P (Signed)
Kurt Reid is an 63 y.o. male.   Chief Complaint: Infected dog bite right hand with ascending erythema HPI: Patient presents with a infected dog bite with ascending erythema and cellulitic changes x2-1/2 days. He states he has general pain in the hand. He states this was his dog. This was a domesticated old. He notes no locking popping catching or prior injury.  He denies neck back chest or abdominal pain.  He has a posterior medical issues and problems.  He noticed worsening over the last 36 hours which prompted emergency room visit at [facility].  He was seen evaluated and transferred to [facility] for admission and surgery  Past Medical History  Diagnosis Date  . Depression   . Bipolar 1 disorder   . ADHD (attention deficit hyperactivity disorder)   . GERD (gastroesophageal reflux disease)   . Hepatitis C   . Hyperlipidemia   . Low back pain     chronic  . Allergic rhinitis   . Seizure disorder     H/O  . Benign prostatic hypertrophy   . Migraine   . ETOH abuse     hx  . Nephrolithiasis     hx  . Sinusitis     recurrent  . Diverticulosis of colon   . Anxiety   . ADHD 09/23/2006  . ALLERGIC RHINITIS 09/23/2006  . ANXIETY 04/19/2009  . BENIGN PROSTATIC HYPERTROPHY 11/02/2007  . BIPOLAR AFFECTIVE DISORDER 09/23/2006  . Chronic pain syndrome 01/18/2009  . DEPRESSION 09/23/2006  . DIVERTICULOSIS, COLON 04/19/2009  . ESOPHAGEAL VARICES 04/19/2009  . GERD 09/23/2006  . HEPATITIS C 09/23/2006  . HYPERLIPIDEMIA 09/23/2006  . INSOMNIA-SLEEP DISORDER-UNSPEC 04/19/2009  . LOW BACK PAIN 09/23/2006  . NEPHROLITHIASIS, HX OF 11/02/2007  . SEIZURE DISORDER 09/23/2006  . 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 Diabetes mellitus without complication   . MRSA (methicillin resistant staph aureus) culture positive     Past Surgical History  Procedure Laterality Date  . Back surgury      x 5  . Knee surgury      x 1  . Cheekbone      hx of fx  . Tonsillectomy     . S/p sinus surgury  2010    Dr. Phebe Colla    Family History  Problem Relation Age of Onset  . Stroke Mother   . Hyperlipidemia Mother   . Depression Mother   . Hypertension Mother   . Asthma Daughter   . Dementia Father    Social History:  reports that he has quit smoking. He has never used smokeless tobacco. He reports that he does not drink alcohol or use illicit drugs.  Allergies:  Allergies  Allergen Reactions  . Duloxetine     REACTION: memory dysfunction, dizziness  . Latex   . Levofloxacin Nausea Only  . Mercury   . Sulfonamide Derivatives      (Not in a hospital admission)  Results for orders placed during the hospital encounter of 12/10/13 (from the past 48 hour(s))  CBC WITH DIFFERENTIAL     Status: Abnormal   Collection Time    12/10/13  1:15 AM      Result Value Ref Range   WBC 13.4 (*) 4.0 - 10.5 K/uL   RBC 4.41  4.22 - 5.81 MIL/uL   Hemoglobin 13.6  13.0 - 17.0 g/dL   HCT 38.9 (*) 39.0 - 52.0 %   MCV 88.2  78.0 - 100.0  fL   MCH 30.8  26.0 - 34.0 pg   MCHC 35.0  30.0 - 36.0 g/dL   RDW 13.8  11.5 - 15.5 %   Platelets 103 (*) 150 - 400 K/uL   Comment: SPECIMEN CHECKED FOR CLOTS     REPEATED TO VERIFY   Neutrophils Relative % 73  43 - 77 %   Neutro Abs 9.7 (*) 1.7 - 7.7 K/uL   Lymphocytes Relative 16  12 - 46 %   Lymphs Abs 2.1  0.7 - 4.0 K/uL   Monocytes Relative 10  3 - 12 %   Monocytes Absolute 1.4 (*) 0.1 - 1.0 K/uL   Eosinophils Relative 2  0 - 5 %   Eosinophils Absolute 0.2  0.0 - 0.7 K/uL   Basophils Relative 0  0 - 1 %   Basophils Absolute 0.0  0.0 - 0.1 K/uL   WBC Morphology WHITE COUNT CONFIRMED ON SMEAR     Smear Review PLATELET COUNT CONFIRMED BY SMEAR    BASIC METABOLIC PANEL     Status: Abnormal   Collection Time    12/10/13  1:15 AM      Result Value Ref Range   Sodium 136 (*) 137 - 147 mEq/L   Potassium 4.0  3.7 - 5.3 mEq/L   Chloride 98  96 - 112 mEq/L   CO2 26  19 - 32 mEq/L   Glucose, Bld 258 (*) 70 - 99 mg/dL    BUN 12  6 - 23 mg/dL   Creatinine, Ser 0.80  0.50 - 1.35 mg/dL   Calcium 8.8  8.4 - 10.5 mg/dL   GFR calc non Af Amer >90  >90 mL/min   GFR calc Af Amer >90  >90 mL/min   Comment: (NOTE)     The eGFR has been calculated using the CKD EPI equation.     This calculation has not been validated in all clinical situations.     eGFR's persistently <90 mL/min signify possible Chronic Kidney     Disease.   Anion gap 12  5 - 15   Dg Chest 2 View  12/10/2013   CLINICAL DATA:  Animal bite  EXAM: CHEST  2 VIEW  COMPARISON:  Prior radiograph from 06/03/2013  FINDINGS: The cardiac and mediastinal silhouettes are stable in size and contour, and remain within normal limits.  The lungs are normally inflated. No airspace consolidation, pleural effusion, or pulmonary edema is identified. There is no pneumothorax.  No acute osseous abnormality identified.  IMPRESSION: No active cardiopulmonary disease.   Electronically Signed   By: Jeannine Boga M.D.   On: 12/10/2013 03:52   Dg Hand Complete Right  12/10/2013   CLINICAL DATA:  Dog bite to hand 3 days ago. Right hand pain, swelling, warmth, and erythema. Limited range of motion. Initial encounter.  EXAM: RIGHT HAND - COMPLETE 3+ VIEW  COMPARISON:  None.  FINDINGS: Soft tissue swelling is present over the dorsum of the hand. No acute osseous abnormality is present. No radiopaque foreign body is present. The joints are located.  IMPRESSION: Soft tissue swelling over the dorsum of the hand without an acute osseous abnormality or radiopaque foreign body.   Electronically Signed   By: Lawrence Santiago M.D.   On: 12/10/2013 02:15    ROS  Blood pressure 98/67, pulse 95, temperature 98.8 F (37.1 C), temperature source Oral, resp. rate 18, height _0  (1.854 m), weight 92.987 kg (205 lb), SpO2 93.00%. Physical Exam  White male  alert and oriented.  HEENT shows poor dentition. He has no visual change nausea or vomiting.  Chest is clear with equal  expansion  Abdomen is nontender nondistended.  Heart regular rate  Lower extremity examination and left upper extremity examination is benign.  Patient has a right hand with 3 dorsal wounds. There is your edema and slight fluctuance around the series with ascending red streaks and cellulitis up into the armpit region.  There's no axillary lymph nodes palpable. The patient has no palmar wounds. He can make a fist. There is no joint tenderness.  I reviewed his x-rays and findings.   Assessment/Plan Infected dog bite with ascending cellulitis and lymphangitis  I would recommend admission IV antibiotics and surgical I&D. Given his multiple medical issues I feel that a surgical I&D would be in his best interest before this gets worse. The patient I discussed this at length.  Will make sure that we have hospitalist on board for medical purposes including diabetic management etc.  I discussed with the patient risk and benefits timeframe duration of recovery and plans  He desires to proceed.  We are planning surgery for your upper extremity. The risk and benefits of surgery include risk of bleeding infection anesthesia damage to normal structures and failure of the surgery to accomplish its intended goals of relieving symptoms and restoring function with this in mind we'll going to proceed. I have specifically discussed with the patient the pre-and postoperative regime and the does and don'ts and risk and benefits in great detail. Risk and benefits of surgery also include risk of dystrophy chronic nerve pain failure of the healing process to go onto completion and other inherent risks of surgery The relavent the pathophysiology of the disease/injury process, as well as the alternatives for treatment and postoperative course of action has been discussed in great detail with the patient who desires to proceed.  We will do everything in our power to help you (the patient) restore function to the  upper extremity. Is a pleasure to see this patient today.   Paulene Floor 12/10/2013, 4:33 AM

## 2013-12-10 NOTE — ED Notes (Signed)
Placed call to [facility] per Dr. Palumbo request  

## 2013-12-10 NOTE — ED Notes (Signed)
Family at bedside. 

## 2013-12-10 NOTE — Op Note (Signed)
NAME:  Kurt Reid, Kurt Reid               ACCOUNT NO.:  [id number]  MEDICAL RECORD NO.:  [id number]  LOCATION:  [id number]                        FACILITY:  [facility]  PHYSICIAN:  Dionne AnoWilliam M. Burna Atlas, M.D.DATE OF BIRTH:  [birth year]  DATE OF PROCEDURE: DATE OF DISCHARGE:                              OPERATIVE REPORT   PREOPERATIVE DIAGNOSES:  Right hand and wrist dorsal dog bite wound, infected in nature with ascending cellulitis, lymphangitis.  POSTOPERATIVE DIAGNOSES:  Right hand and wrist dorsal dog bite wound, infected in nature with ascending cellulitis, lymphangitis.  PROCEDURE: 1. Irrigation and debridement of abscess, right dorsal hand x3     separate puncture wounds. 2. Extensor tendon tenolysis tenosynovectomy, right hand.  SURGEON:  Dionne AnoWilliam M. Amanda PeaGramig, M.D.  ASSISTANT:  None.  COMPLICATIONS:  None.  ANESTHESIA:  General.  TOURNIQUET TIME:  0.  INDICATIONS:  A 63 year old male with multiple medical problems, who presents for evaluation.  [facility] due to the severity of his injuries.  I have counseled him in regards to risks and benefits of surgery, timeframe duration of recovery, do's and don'ts, etc.  With this in mind, he desires to proceed.  All questions have been encouraged and answered preoperatively.  OPERATIVE PROCEDURE:  The patient was seen by myself and Anesthesia, taken to the operative suite, and underwent smooth induction of general anesthetic.  He was laid supine, fully padded, prepped and draped in usual sterile fashion with Betadine scrub and paint.  Time-out was called.  Body parts secured.  Arms elevated and following this, we surveyed the landscape of his hand.  At this time, I performed irrigation and debridement with decompression of abscesses about 3 separate, 2-3 mm wounds.  Vessel loop drains were used to connect the windows together and following this, I then dissected down into the most distal wound, performed the  extensor tenolysis tenosynovectomy. Cultures were taken for aerobic, anaerobic culture and stat Gram stain. The patient had an impressive amount of cellulitis with ascending red streaks and lymphangitis.  The patient did not have an over exuberant amount of purulent material deep within the wound and there was no obvious bony encroachment.  I irrigated with 3 L of saline and then placed Adaptic, Xeroform gauze, and a volar fiberglass splint, well- padded nature on the hand.  He will be admitted for IV antibiotics, general postop observation, and other measures.  Should any problems arise, we are going to be immediately available.  We will have Medicine on board for his multiple medical issues as well.  We will continue IV Unasyn which he had prior to the operative intervention until symptoms subside.  Latex precautions were observed.  The patient does have a latex allergy.  All questions have been encouraged and answered.     Dionne AnoWilliam M. Amanda PeaGramig, M.D.     [facility]  D:  12/10/2013  T:  12/10/2013  Job:  [id number]

## 2013-12-11 LAB — BASIC METABOLIC PANEL
Anion gap: 10 (ref 5–15)
BUN: 14 mg/dL (ref 6–23)
CO2: 24 mEq/L (ref 19–32)
Calcium: 8.1 mg/dL — ABNORMAL LOW (ref 8.4–10.5)
Chloride: 104 mEq/L (ref 96–112)
Creatinine, Ser: 0.67 mg/dL (ref 0.50–1.35)
GFR calc Af Amer: >90 mL/min (ref >90)
GFR calc non Af Amer: >90 mL/min (ref >90)
GLUCOSE: 106 mg/dL — AB (ref 70–99)
POTASSIUM: 4 meq/L (ref 3.7–5.3)
SODIUM: 138 meq/L (ref 137–147)

## 2013-12-11 LAB — GLUCOSE, CAPILLARY
GLUCOSE-CAPILLARY: 84 mg/dL (ref 70–99)
GLUCOSE-CAPILLARY: 175 mg/dL — AB (ref 70–99)
GLUCOSE-CAPILLARY: 69 mg/dL — AB (ref 70–99)
Glucose-Capillary: 139 mg/dL — ABNORMAL HIGH (ref 70–99)

## 2013-12-11 LAB — CBC
HCT: 37.1 % — ABNORMAL LOW (ref 39.0–52.0)
Hemoglobin: 12.9 g/dL — ABNORMAL LOW (ref 13.0–17.0)
MCH: 30.9 pg (ref 26.0–34.0)
MCHC: 34.8 g/dL (ref 30.0–36.0)
MCV: 89 fL (ref 78.0–100.0)
Platelets: 82 10*3/uL — ABNORMAL LOW (ref 150–400)
RBC: 4.17 MIL/uL — AB (ref 4.22–5.81)
RDW: 14 % (ref 11.5–15.5)
WBC: 7.7 10*3/uL (ref 4.0–10.5)

## 2013-12-11 NOTE — Progress Notes (Signed)
Consult Note                                            Patient Demographics  Kurt Reid, is a 63 y.o. male, DOB - 07/11/1950, ZOX:[id number]  Admit date - 12/10/2013   Admitting Physician Lynden Oxford, MD  Outpatient Primary MD for the patient is Oliver Barre, MD  LOS - 1   Chief Complaint  Patient presents with  . Animal Bite        Subjective:   Kurt Reid today has, No headache, No chest pain, No abdominal pain - No Nausea, No new weakness tingling or numbness, No Cough - SOB.    Assessment & Plan    1. R. arm cellulitis post dog bite. Status post Incision and drainage in the service his hand surgery, currently on IV Unasyn, continue antibiotic follow cultures. Defer management of this problem to primary team which is hand surgery. He received tetanus shot in the ER, dog was adequately vaccinated and bite was purely accidental. Leukocytosis resolved. Clinically appears stable. Monitoring cultures.    2.DM 2 - on Lantus and sliding scale, good control here.  Lab Results  Component Value Date   HGBA1C 10.4* 12/10/2013    CBG (last 3)   Recent Labs  12/10/13 1634 12/10/13 2207 12/11/13 0639  GLUCAP 202* 143* 84     3. History of diabetic neuropathy and seizures. Currently on Neurontin continue.    4. Hep C and alcoholic cirrhosis with portal hypertension and esophageal varices. Currently stable, no acute issues, continue combination of lactulose, Inderal and Aldactone.    5. GERD. On PPI.    6. Chronic back pain. On MS Contin.         Medications  Scheduled Meds: . ampicillin-sulbactam (UNASYN) IV  3 g Intravenous Q6H  . aspirin  325 mg Oral Daily  . docusate sodium  100 mg Oral BID   . gabapentin  300 mg Oral QHS  . gabapentin  600 mg Oral BID  . heparin subcutaneous  5,000 Units Subcutaneous 3 times per day  . insulin aspart  0-15 Units Subcutaneous TID WC  . insulin aspart  0-5 Units Subcutaneous QHS  . insulin detemir  45 Units Subcutaneous QHS  . morphine  15 mg Oral BID  . pantoprazole  40 mg Oral Daily  . propranolol  20 mg Oral BID  . spironolactone  50 mg Oral Daily  . venlafaxine XR  300 mg Oral Q breakfast  . vitamin C  1,000 mg Oral Daily   Continuous Infusions:  PRN Meds:.famotidine, lactulose, morphine injection, ondansetron (ZOFRAN) IV, oxyCODONE, promethazine  DVT Prophylaxis   SCDs  Lab Results  Component Value Date   PLT 82* 12/11/2013   No results found for this basename: INR, PROTIME    Antibiotics    Anti-infectives   Start     Dose/Rate Route Frequency Ordered Stop   12/10/13 0600  Ampicillin-Sulbactam (UNASYN) 3 g in sodium chloride 0.9 % 100 mL IVPB     3 g 100 mL/hr over 60 Minutes Intravenous Every 6 hours 12/10/13 0543     12/10/13 0145  [MAR Hold]  vancomycin (VANCOCIN) IVPB 1000 mg/200 mL premix     (On MAR Hold since 12/10/13 0508)   1,000 mg 200 mL/hr over 60 Minutes Intravenous  Once 12/10/13 0131 12/10/13 0511   12/10/13 0115  Ampicillin-Sulbactam (UNASYN) 3 g in sodium chloride 0.9 % 100 mL IVPB     3 g 100 mL/hr over 60 Minutes Intravenous  Once 12/10/13 0113 12/10/13 0307          Objective:   Filed Vitals:   12/10/13 1302 12/10/13 2204 12/10/13 2324 12/11/13 0441  BP: 116/71 105/59 98/70 105/67  Pulse: 85 80 74 76  Temp: 98.8 F (37.1 C) 99.1 F (37.3 C)  98.4 F (36.9 C)  TempSrc:  Oral  Oral  Resp: 20 16  16   Height:      Weight:      SpO2: 100% 94%  95%    Wt Readings from Last 3 Encounters:  12/10/13 92.987 kg (205 lb)  12/10/13 92.987 kg (205 lb)  10/07/13 93.668 kg (206 lb 8 oz)     Intake/Output Summary (Last 24 hours) at 12/11/13 0916 Last data filed at 12/11/13 0441  Gross per 24  hour  Intake   1060 ml  Output      0 ml  Net   1060 ml     Physical Exam  Awake Alert, Oriented X 3, No new F.N deficits, Normal affect [place].AT,PERRAL Supple Neck,No JVD, No cervical lymphadenopathy appriciated.  Symmetrical Chest wall movement, Good air movement bilaterally, CTAB RRR,No Gallops,Rubs or new Murmurs, No Parasternal Heave +ve B.Sounds, Abd Soft, No tenderness, No organomegaly appriciated, No rebound - guarding or rigidity. No Cyanosis, Clubbing or edema, No new Rash or bruise  , right arm in bandage   Data Review   Micro Results Recent Results (from the past 240 hour(s))  CULTURE, ROUTINE-ABSCESS     Status: None   Collection Time    12/10/13  5:22 AM      Result Value Ref Range Status   Specimen Description ABSCESS RIGHT HAND   Final   Special Requests PATIENT ON FOLLOWING 3GM UNISON,1GM VANCOMYCIN   Final   Gram Stain     Final   Value: NO WBC SEEN     NO SQUAMOUS EPITHELIAL CELLS SEEN     NO ORGANISMS SEEN     [facility]   Culture     Final   Value: NO GROWTH 1 DAY     Performed at [facility]   Report Status PENDING   Incomplete    Radiology Reports Dg Chest 2 View  12/10/2013   CLINICAL DATA:  Animal bite  EXAM: CHEST  2 VIEW  COMPARISON:  Prior radiograph from 06/03/2013  FINDINGS: The cardiac and mediastinal silhouettes are stable in size and contour, and remain within normal limits.  The lungs are normally inflated. No airspace consolidation, pleural effusion, or pulmonary edema is identified. There is no pneumothorax.  No acute osseous abnormality identified.  IMPRESSION:  No active cardiopulmonary disease.   Electronically Signed   By: Rise MuBenjamin  McClintock M.D.   On: 12/10/2013 03:52   Dg Hand Complete Right  12/10/2013   CLINICAL DATA:  Dog bite to hand 3 days ago. Right hand pain, swelling, warmth, and erythema. Limited range of motion. Initial encounter.  EXAM: RIGHT HAND - COMPLETE 3+ VIEW  COMPARISON:  None.   FINDINGS: Soft tissue swelling is present over the dorsum of the hand. No acute osseous abnormality is present. No radiopaque foreign body is present. The joints are located.  IMPRESSION: Soft tissue swelling over the dorsum of the hand without an acute osseous abnormality or radiopaque foreign body.   Electronically Signed   By: Gennette Pachris  Mattern M.D.   On: 12/10/2013 02:15     CBC  Recent Labs Lab 12/10/13 0115 12/11/13 0425  WBC 13.4* 7.7  HGB 13.6 12.9*  HCT 38.9* 37.1*  PLT 103* 82*  MCV 88.2 89.0  MCH 30.8 30.9  MCHC 35.0 34.8  RDW 13.8 14.0  LYMPHSABS 2.1  --   MONOABS 1.4*  --   EOSABS 0.2  --   BASOSABS 0.0  --     Chemistries   Recent Labs Lab 12/10/13 0115 12/11/13 0425  NA 136* 138  K 4.0 4.0  CL 98 104  CO2 26 24  GLUCOSE 258* 106*  BUN 12 14  CREATININE 0.80 0.67  CALCIUM 8.8 8.1*   ------------------------------------------------------------------------------------------------------------------ estimated creatinine clearance is 106.8 ml/min (by C-G formula based on Cr of 0.67). ------------------------------------------------------------------------------------------------------------------  Recent Labs  12/10/13 1014  HGBA1C 10.4*   ------------------------------------------------------------------------------------------------------------------ No results found for this basename: CHOL, HDL, LDLCALC, TRIG, CHOLHDL, LDLDIRECT,  in the last 72 hours ------------------------------------------------------------------------------------------------------------------ No results found for this basename: TSH, T4TOTAL, FREET3, T3FREE, THYROIDAB,  in the last 72 hours ------------------------------------------------------------------------------------------------------------------ No results found for this basename: VITAMINB12, FOLATE, FERRITIN, TIBC, IRON, RETICCTPCT,  in the last 72 hours  Coagulation profile No results found for this basename: INR, PROTIME,   in the last 168 hours  No results found for this basename: DDIMER,  in the last 72 hours  Cardiac Enzymes No results found for this basename: CK, CKMB, TROPONINI, MYOGLOBIN,  in the last 168 hours ------------------------------------------------------------------------------------------------------------------ No components found with this basename: POCBNP,      Time Spent in minutes   35   Pearley Baranek K M.D on 12/11/2013 at 9:16 AM  Between 7am to 7pm - Pager - [phone]  After 7pm go to [email or web address] - password [email or web address]  And look for the night coverage person covering for me after hours  [facility] Office  [phone]

## 2013-12-11 NOTE — Progress Notes (Signed)
Subjective: 1 Day Post-Op Procedure(s) (LRB): IRRIGATION AND DEBRIDEMENT OF MULTIPLE DOG BITES RIGHT HAND AND WRIST (Right) Patient reports pain as mild.    Tolerate a regular diet  voiding well no complicating features today Diabetic management is stable    Objective: Vital signs in last 24 hours: Temp:  [98.4 F (36.9 C)-99.1 F (37.3 C)] 98.4 F (36.9 C) (10/10 0441) Pulse Rate:  [74-85] 76 (10/10 0441) Resp:  [16-20] 16 (10/10 0441) BP: (98-116)/(59-71) 105/67 mmHg (10/10 0441) SpO2:  [94 %-100 %] 95 % (10/10 0441)  Intake/Output from previous day: 10/09 0701 - 10/10 0700 In: 1300 [P.O.:1200] Out: -  Intake/Output this shift: Total I/O In: 240 [P.O.:240] Out: -    Recent Labs  12/10/13 0115 12/11/13 0425  HGB 13.6 12.9*    Recent Labs  12/10/13 0115 12/11/13 0425  WBC 13.4* 7.7  RBC 4.41 4.17*  HCT 38.9* 37.1*  PLT 103* 82*    Recent Labs  12/10/13 0115 12/11/13 0425  NA 136* 138  K 4.0 4.0  CL 98 104  CO2 26 24  BUN 12 14  CREATININE 0.80 0.67  GLUCOSE 258* 106*  CALCIUM 8.8 8.1*   No results found for this basename: LABPT, INR,  in the last 72 hours Physical exam Patient is seen and examined. I removed his bandage. Following this a performed irrigation debridement skin subcutaneous tissue with scissor tip and knife blade. He tolerated this well. Lavage was placed to the 3 wounds and drains were removed. I performed dressing change with waking of the 3 wounds.  He is 50% improved compared to proximally 30 hours ago. There is no signs compartment syndrome or necrotizing fasciitis.  I feel he needs a little bit more time with IV antibiotics and hopefully will be ready to DC him on  Neurologically intact ABD soft Neurovascular intact Sensation intact distally Intact pulses distally Compartment soft  Assessment/Plan: 1 Day Post-Op Procedure(s) (LRB): IRRIGATION AND DEBRIDEMENT OF MULTIPLE DOG BITES RIGHT HAND AND WRIST (Right) Patient  is improved but not completely resolve.  Given the continued erythema I would recommend IV antibiotics for an additional 24 hours.  All perform a wound check tomorrow as well and hopefully a be ready for discharge at that time.  We'll continue Unasyn.  We'll await cultures.  Shouldn't problems arise all be immediately available.  I discussed with medicine and appreciate their help  All questions were encouraged and answered  Plan for discharge tomorrow if the wound conditions improve  Xane Amsden III,Candela Krul M 12/11/2013, 12:05 PM

## 2013-12-12 LAB — GLUCOSE, CAPILLARY
GLUCOSE-CAPILLARY: 202 mg/dL — AB (ref 70–99)
Glucose-Capillary: 87 mg/dL (ref 70–99)

## 2013-12-12 MED ORDER — AMOXICILLIN-POT CLAVULANATE 875-125 MG PO TABS: 1.0000 | ORAL_TABLET | Freq: Two times a day (BID) | ORAL | Status: DC

## 2013-12-12 NOTE — Discharge Summary (Signed)
Physician Discharge Summary  Patient ID: Kurt BundeRichard S Reid MRN: [id number] DOB/AGE: 07/26/1950 63 y.o.  Admit date: 12/10/2013 Discharge date: 12/12/2013  Admission Diagnoses: Dog bite right hand status post I&D  Discharge Diagnoses: Same Active Problems:   GERD   Convulsions   NEPHROLITHIASIS, HX OF   ADHD (attention deficit hyperactivity disorder)   Cellulitis   Dog bite of multiple sites of right hand and fingers   DM type 2 without retinopathy   Chronic hepatitis C   Dyslipidemia   Discharged Condition: good  Hospital Course: Patient was admitted postop status post irrigation and debridement infected dog bite. He did well throughout his hospitalization. He was placed on IV Unasyn. On postop day 2 he looks much better and was sent for discharge. He'll be discharged today.  He has a normal temperature. He is able to void.  He has ability to eat a regular diet.  He has no comp dating features.  Hospitalist have seen him an improved discharge. At time of discharge he looks quite well with dressing change and irrigation and debridement performed at bedside today by myself. He understands the postop plans and followup I've given him his wife thorough instructions as will his bandages for home use  Consults: Hospitalist  Significant Diagnostic Studies: labs: See chart  Treatments: surgery: See chart  Discharge Exam: Blood pressure 112/62, pulse 86, temperature 98.4 F (36.9 C), temperature source Oral, resp. rate 18, height 6\' 1"  (1.854 m), weight 92.987 kg (205 lb), SpO2 92.00%. General appearance: alert and cooperative The patient is alert and oriented in no acute distress the patient complains of pain in the affected upper extremity.  The patient is noted to have a normal HEENT exam.  Lung fields show equal chest expansion and no shortness of breath  abdomen exam is nontender without distention.  Lower extremity examination does not show any fracture dislocation or blood  clot symptoms.  Pelvis is stable neck and back are stable and nontender  His wound is much improved  erythema is decreased.  The incisions should healing with secondary intention.  Disposition:      Medication List    ASK your doctor about these medications       aspirin EC 81 MG EC tablet  Generic drug:  aspirin  Take 81 mg by mouth daily.     B-D UF III MINI PEN NEEDLES 31G X 5 MM Misc  Generic drug:  Insulin Pen Needle  USE AS DIRECTED ONCE DAILY     fluticasone 50 MCG/ACT nasal spray  Commonly known as:  FLONASE  2 sprays by Nasal route daily.     gabapentin 300 MG capsule  Commonly known as:  NEURONTIN  Take 600 mg by mouth 2 (two) times daily.     glipiZIDE 10 MG 24 hr tablet  Commonly known as:  GLUCOTROL XL  Take 10 mg by mouth 2 (two) times daily.     glucose blood test strip  Commonly known as:  ONE TOUCH ULTRA TEST  USE AS DIRECTED three times daily 250.02     HYDROcodone-acetaminophen 10-325 MG per tablet  Commonly known as:  NORCO  Take 1 tablet by mouth 3 (three) times daily as needed for severe pain.     Insulin Detemir 100 UNIT/ML Pen  Commonly known as:  LEVEMIR FLEXPEN  Use as directed 40 units at bedtime.     lactulose 10 GM/15ML solution  Commonly known as:  CHRONULAC  Take 10 g by mouth daily  as needed for mild constipation or moderate constipation.     Lancets Misc  Use as directed three times per day 250.02     metFORMIN 500 MG (MOD) 24 hr tablet  Commonly known as:  GLUMETZA  Take 2,000 mg by mouth daily with breakfast.     morphine 30 MG 12 hr tablet  Commonly known as:  MS CONTIN  Take 15 mg by mouth 2 (two) times daily. To fill sept 8, 2015     omeprazole 20 MG capsule  Commonly known as:  PRILOSEC  Take 20 mg by mouth daily.     ONE TOUCH SURESOFT Misc  Use as directed once per day     propranolol 20 MG tablet  Commonly known as:  INDERAL  Take 20 mg by mouth 2 (two) times daily.     spironolactone 50 MG tablet   Commonly known as:  ALDACTONE  Take 50 mg by mouth daily.     venlafaxine XR 150 MG 24 hr capsule  Commonly known as:  EFFEXOR-XR  Take 300 mg by mouth daily with breakfast.           Follow-up Information   Follow up with Karen ChafeGRAMIG III,Halina Asano M, MD In 4 days. (Please call [phone] and to see Dr. Amanda PeaGramig Thursday or Friday this week)    Specialty:  Orthopedic Surgery   Contact information:   [address] [place] [address] [phone]       Signed: Karen ChafeGRAMIG III,Faelynn Wynder M 12/12/2013, 10:20 AM

## 2013-12-12 NOTE — Discharge Instructions (Signed)
Please elevate move and massage her fingers.  Please call for any problems.  Please call see Dr. Amanda PeaGramig Thursday or Friday this week.  Please take her antibiotics until they're finished.  Notify should any problems occur.  You may take her regular pain medicine for pain in her arm as well as her back.  Please do not go to physical therapy this week that you can go to all the other doctors appointments that you had previously made

## 2013-12-12 NOTE — Progress Notes (Signed)
Discharge instructions reviewed. Patient ready for discharge. Wife will drive home.

## 2013-12-12 NOTE — Progress Notes (Signed)
Consult Note                                            Patient Demographics  Kurt Reid, is a 63 y.o. male, DOB - 05-Jan-1951, GNF:[id number]  Admit date - 12/10/2013   Admitting Physician Lynden OxfordPranav Patel, MD  Outpatient Primary MD for the patient is Oliver BarreJames John, MD  LOS - 2   Chief Complaint  Patient presents with  . Animal Bite        Subjective:   Kurt Hoopsichard Zeitler today has, No headache, No chest pain, No abdominal pain - No Nausea, No new weakness tingling or numbness, No Cough - SOB.    Assessment & Plan    Hospitalist team will sign off, kindly call me directly with any questions medicine related cell phone number [phone].   1. R. arm cellulitis post dog bite. Status post Incision and drainage in the service his hand surgery, currently on IV Unasyn, continue antibiotic follow cultures. Defer management of this problem to primary team which is hand surgery. He received tetanus shot in the ER, dog was adequately vaccinated and bite was purely accidental. Leukocytosis resolved. Clinically appears stable. Follow cultures.    2.DM 2 - on Lantus and sliding scale, good control here.  Lab Results  Component Value Date   HGBA1C 10.4* 12/10/2013    CBG (last 3)   Recent Labs  12/11/13 1652 12/11/13 2148 12/12/13 0636  GLUCAP 69* 139* 202*     3. History of diabetic neuropathy and seizures. Currently on Neurontin continue.    4. Hep C and alcoholic cirrhosis with portal hypertension and esophageal varices. Currently stable, no acute issues, continue combination of lactulose, Inderal and Aldactone.    5. GERD. On PPI.    6. Chronic back pain. On MS Contin.       Hospitalist team will sign off,  kindly call me directly with any questions medicine related cell phone number [phone].     Medications  Scheduled Meds: . ampicillin-sulbactam (UNASYN) IV  3 g Intravenous Q6H  . aspirin  325 mg Oral Daily  . docusate sodium  100 mg Oral BID  . gabapentin  300 mg Oral QHS  . gabapentin  600 mg Oral BID  . insulin aspart  0-15 Units Subcutaneous TID WC  . insulin aspart  0-5 Units Subcutaneous QHS  . insulin detemir  45 Units Subcutaneous QHS  . morphine  15 mg Oral BID  . pantoprazole  40 mg Oral Daily  . propranolol  20 mg Oral BID  . spironolactone  50 mg Oral Daily  . venlafaxine XR  300 mg Oral Q breakfast  . vitamin  C  1,000 mg Oral Daily   Continuous Infusions:  PRN Meds:.famotidine, lactulose, morphine injection, ondansetron (ZOFRAN) IV, oxyCODONE, promethazine  DVT Prophylaxis   SCDs  Lab Results  Component Value Date   PLT 82* 12/11/2013   No results found for this basename: INR,  PROTIME    Antibiotics    Anti-infectives   Start     Dose/Rate Route Frequency Ordered Stop   12/10/13 0600  Ampicillin-Sulbactam (UNASYN) 3 g in sodium chloride 0.9 % 100 mL IVPB     3 g 100 mL/hr over 60 Minutes Intravenous Every 6 hours 12/10/13 0543     12/10/13 0145  [MAR Hold]  vancomycin (VANCOCIN) IVPB 1000 mg/200 mL premix     (On MAR Hold since 12/10/13 0508)   1,000 mg 200 mL/hr over 60 Minutes Intravenous  Once 12/10/13 0131 12/10/13 0511   12/10/13 0115  Ampicillin-Sulbactam (UNASYN) 3 g in sodium chloride 0.9 % 100 mL IVPB     3 g 100 mL/hr over 60 Minutes Intravenous  Once 12/10/13 0113 12/10/13 0307          Objective:   Filed Vitals:   12/11/13 1328 12/11/13 2040 12/11/13 2100 12/12/13 0617  BP: 124/78 127/107 124/72 112/62  Pulse: 71 74  86  Temp: 98.4 F (36.9 C) 98.5 F (36.9 C)  98.4 F (36.9 C)  TempSrc: Oral   Oral  Resp: 18     Height:      Weight:      SpO2: 96% 96%  92%    Wt Readings from Last 3 Encounters:  12/10/13 92.987 kg  (205 lb)  12/10/13 92.987 kg (205 lb)  10/07/13 93.668 kg (206 lb 8 oz)     Intake/Output Summary (Last 24 hours) at 12/12/13 0931 Last data filed at 12/11/13 1700  Gross per 24 hour  Intake    480 ml  Output      0 ml  Net    480 ml     Physical Exam  Awake Alert, Oriented X 3, No new F.N deficits, Normal affect [place].AT,PERRAL Supple Neck,No JVD, No cervical lymphadenopathy appriciated.  Symmetrical Chest wall movement, Good air movement bilaterally, CTAB RRR,No Gallops,Rubs or new Murmurs, No Parasternal Heave +ve B.Sounds, Abd Soft, No tenderness, No organomegaly appriciated, No rebound - guarding or rigidity. No Cyanosis, Clubbing or edema, No new Rash or bruise  , right arm in bandage   Data Review   Micro Results Recent Results (from the past 240 hour(s))  CULTURE, BLOOD (ROUTINE X 2)     Status: None   Collection Time    12/10/13  2:55 AM      Result Value Ref Range Status   Specimen Description BLOOD LEFT ARM   Final   Special Requests BOTTLES DRAWN AEROBIC AND ANAEROBIC 5CC EACH   Final   Culture  Setup Time     Final   Value: 12/10/2013 08:44     Performed at [facility]   Culture     Final   Value:        BLOOD CULTURE RECEIVED NO GROWTH TO DATE CULTURE WILL BE HELD FOR 5 DAYS BEFORE ISSUING A FINAL NEGATIVE REPORT     Performed at [facility]   Report Status PENDING   Incomplete  CULTURE, BLOOD (ROUTINE X 2)     Status: None   Collection Time    12/10/13  3:00 AM      Result Value Ref Range Status  Specimen Description BLOOD RIGHT ARM   Final   Special Requests BOTTLES DRAWN AEROBIC AND ANAEROBIC [facility] EACH   Final   Culture  Setup Time     Final   Value: 12/10/2013 08:44     Performed at [facility]   Culture     Final   Value:        BLOOD CULTURE RECEIVED NO GROWTH TO DATE CULTURE WILL BE HELD FOR 5 DAYS BEFORE ISSUING A FINAL NEGATIVE REPORT     Performed at [facility]   Report Status PENDING   Incomplete    CULTURE, ROUTINE-ABSCESS     Status: None   Collection Time    12/10/13  5:22 AM      Result Value Ref Range Status   Specimen Description ABSCESS RIGHT HAND   Final   Special Requests PATIENT ON FOLLOWING 3GM UNISON,1GM VANCOMYCIN   Final   Gram Stain     Final   Value: NO WBC SEEN     NO SQUAMOUS EPITHELIAL CELLS SEEN     NO ORGANISMS SEEN     Performed at [facility]   Culture     Final   Value: NO GROWTH 2 DAYS     Performed at [facility]   Report Status PENDING   Incomplete    Radiology Reports Dg Chest 2 View  12/10/2013   CLINICAL DATA:  Animal bite  EXAM: CHEST  2 VIEW  COMPARISON:  Prior radiograph from 06/03/2013  FINDINGS: The cardiac and mediastinal silhouettes are stable in size and contour, and remain within normal limits.  The lungs are normally inflated. No airspace consolidation, pleural effusion, or pulmonary edema is identified. There is no pneumothorax.  No acute osseous abnormality identified.  IMPRESSION: No active cardiopulmonary disease.   Electronically Signed   By: Rise Mu M.D.   On: 12/10/2013 03:52   Dg Hand Complete Right  12/10/2013   CLINICAL DATA:  Dog bite to hand 3 days ago. Right hand pain, swelling, warmth, and erythema. Limited range of motion. Initial encounter.  EXAM: RIGHT HAND - COMPLETE 3+ VIEW  COMPARISON:  None.  FINDINGS: Soft tissue swelling is present over the dorsum of the hand. No acute osseous abnormality is present. No radiopaque foreign body is present. The joints are located.  IMPRESSION: Soft tissue swelling over the dorsum of the hand without an acute osseous abnormality or radiopaque foreign body.   Electronically Signed   By: Gennette Pac M.D.   On: 12/10/2013 02:15     CBC  Recent Labs Lab 12/10/13 0115 12/11/13 0425  WBC 13.4* 7.7  HGB 13.6 12.9*  HCT 38.9* 37.1*  PLT 103* 82*  MCV 88.2 89.0  MCH 30.8 30.9  MCHC 35.0 34.8  RDW 13.8 14.0  LYMPHSABS 2.1  --   MONOABS 1.4*  --   EOSABS  0.2  --   BASOSABS 0.0  --     Chemistries   Recent Labs Lab 12/10/13 0115 12/11/13 0425  NA 136* 138  K 4.0 4.0  CL 98 104  CO2 26 24  GLUCOSE 258* 106*  BUN 12 14  CREATININE 0.80 0.67  CALCIUM 8.8 8.1*   ------------------------------------------------------------------------------------------------------------------ estimated creatinine clearance is 106.8 ml/min (by C-G formula based on Cr of 0.67). ------------------------------------------------------------------------------------------------------------------  Recent Labs  12/10/13 1014  HGBA1C 10.4*   ------------------------------------------------------------------------------------------------------------------ No results found for this basename: CHOL, HDL, LDLCALC, TRIG, CHOLHDL, LDLDIRECT,  in the last 72 hours ------------------------------------------------------------------------------------------------------------------  No results found for this basename: TSH, T4TOTAL, FREET3, T3FREE, THYROIDAB,  in the last 72 hours ------------------------------------------------------------------------------------------------------------------ No results found for this basename: VITAMINB12, FOLATE, FERRITIN, TIBC, IRON, RETICCTPCT,  in the last 72 hours  Coagulation profile No results found for this basename: INR, PROTIME,  in the last 168 hours  No results found for this basename: DDIMER,  in the last 72 hours  Cardiac Enzymes No results found for this basename: CK, CKMB, TROPONINI, MYOGLOBIN,  in the last 168 hours ------------------------------------------------------------------------------------------------------------------ No components found with this basename: POCBNP,      Time Spent in minutes   35   SINGH,PRASHANT K M.D on 12/12/2013 at 9:31 AM  Between 7am to 7pm - Pager - [phone]  After 7pm go to [email or web address] - password [email or web address]  And look for the night coverage person covering for me after  hours  [facility] Office  [phone]

## 2013-12-13 ENCOUNTER — Encounter (HOSPITAL_COMMUNITY): Payer: Self-pay | Admitting: Orthopedic Surgery

## 2013-12-13 LAB — CULTURE, ROUTINE-ABSCESS
CULTURE: NO GROWTH
Gram Stain: NONE SEEN

## 2013-12-14 ENCOUNTER — Telehealth: Payer: Self-pay | Admitting: *Deleted

## 2013-12-14 NOTE — Telephone Encounter (Signed)
[facility] Triage Call Report Triage Record Num: [id number] Operator: Morrie SheldonAshley Moger Patient Name: Kurt HoopsRichard Ciccarelli Call Date & Time: 12/09/2013 6:06:52PM Patient Phone: [phone] PCP: Oliver BarreJames John Patient Gender: Male PCP Fax : [phone] Patient DOB: Nov 04, 1950 [facility] Name[facility] Reason for Call: Caller: Libby/Spouse; PCP: Oliver BarreJohn, James (Adults only); CB#: [phone]; Call regarding Animal Bite; family dog bit pt's left hand 12/07/13. Caller says it has scabbed over but pt complains of increasing pain today (12/09/13) and believes it is infected. Afebrile. Pt has been cleaning bite with hydrogen peroxide; has also soaked in epsom salt. Caller is not with pt and states he will not answer phone. Triaged per "Bites - Animal or Human" guideline. Disposition is to See ED Immediately; caller answered yes to "Any full thickness puncture wound." Caller requests appt at office, triager called back line, but provider's schedule is full. Advised ED and caller asked for appt 12/10/13. Explained importance of quick treatment so potential infection does not spread and she agrees to take him to ED tonight. Protocol(s) Used: Bites - Animal or Human Recommended Outcome per Protocol: See ED Immediately Reason for Outcome: Any full thickness puncture wound (passes through the skin layers of epidermis and dermis and penetrates into the underlying subcutaneous tissue) Care Advice: ~ Another adult should drive. 10/

## 2013-12-15 ENCOUNTER — Telehealth: Payer: Self-pay | Admitting: *Deleted

## 2013-12-15 NOTE — Telephone Encounter (Signed)
plesae have pt check about Toujeo at the pharmacy (same dosing as his levemir), as well as Lantus

## 2013-12-15 NOTE — Telephone Encounter (Signed)
Left msg on triage stating the price for husband levemir has jump it is costing $158 and we can't afford to pay that amount each month. Requesting md to change insulin to another alternative that is less expensive...Raechel Chute/lmb

## 2013-12-15 NOTE — Telephone Encounter (Signed)
Called cell and home  number of wife Kurt Free(Kurt Reid) left detailed message of MD instructions on Toujeo and to call back to let us know if the cost would be better for this insulin..Marland Kitchen

## 2013-12-16 LAB — CULTURE, BLOOD (ROUTINE X 2)
Culture: NO GROWTH
Culture: NO GROWTH

## 2013-12-16 NOTE — Telephone Encounter (Signed)
Contacted the patients wife this morning and she did received last nights message and will check with the insurance company this afternoon and call back

## 2013-12-23 MED ORDER — INSULIN GLARGINE 300 UNIT/ML ~~LOC~~ SOPN: 40.0000 [IU] | PEN_INJECTOR | Freq: Every day | SUBCUTANEOUS | Status: DC

## 2013-12-23 NOTE — Telephone Encounter (Signed)
Done erx 

## 2013-12-23 NOTE — Addendum Note (Signed)
Addended by: Corwin LevinsJOHN, JAMES W on: 12/23/2013 07:24 PM   Modules accepted: Orders, Medications

## 2013-12-23 NOTE — Telephone Encounter (Signed)
The patients wife did check into the insulin.  Please do send in Toujeo as she thinks it will be the most affordable based on what the pharmacy has told them.

## 2013-12-27 ENCOUNTER — Telehealth: Payer: Self-pay | Admitting: Internal Medicine

## 2013-12-27 MED ORDER — INSULIN GLARGINE 300 UNIT/ML ~~LOC~~ SOPN: 40.0000 [IU] | PEN_INJECTOR | Freq: Every day | SUBCUTANEOUS | Status: DC

## 2013-12-27 NOTE — Telephone Encounter (Signed)
Is requesting insulin script to be resent to [facility] in [place].  Pharmacy is stating they have not received it.

## 2013-12-27 NOTE — Telephone Encounter (Signed)
Called pt wife no answer LMOM will res[facility].../lmb

## 2014-01-07 ENCOUNTER — Ambulatory Visit: Payer: No Typology Code available for payment source | Admitting: Internal Medicine

## 2014-01-18 ENCOUNTER — Ambulatory Visit (INDEPENDENT_AMBULATORY_CARE_PROVIDER_SITE_OTHER): Payer: Medicare Other | Admitting: Internal Medicine

## 2014-01-18 ENCOUNTER — Encounter: Payer: Self-pay | Admitting: Internal Medicine

## 2014-01-18 VITALS — BP 108/62 | HR 103 | Temp 98.4°F | Ht 73.0 in | Wt 207.0 lb

## 2014-01-18 DIAGNOSIS — E119 Type 2 diabetes mellitus without complications: Secondary | ICD-10-CM

## 2014-01-18 DIAGNOSIS — J3089 Other allergic rhinitis: Secondary | ICD-10-CM

## 2014-01-18 DIAGNOSIS — I959 Hypotension, unspecified: Secondary | ICD-10-CM | POA: Insufficient documentation

## 2014-01-18 DIAGNOSIS — I9589 Other hypotension: Secondary | ICD-10-CM

## 2014-01-18 MED ORDER — FLUTICASONE PROPIONATE 50 MCG/ACT NA SUSP: 2.0000 | Freq: Every day | NASAL | Status: DC

## 2014-01-18 MED ORDER — INSULIN GLARGINE 300 UNIT/ML ~~LOC~~ SOPN: 50.0000 [IU] | PEN_INJECTOR | Freq: Every day | SUBCUTANEOUS | Status: DC

## 2014-01-18 NOTE — Assessment & Plan Note (Signed)
Uncontrolled o/w stable overall by history and exam, recent data reviewewd with pt, to increase the toujeo to 50 units per day, ,  to f/u any worsening symptoms or concerns Lab Results  Component Value Date   HGBA1C 10.4* 12/10/2013   For f/u labs next visit

## 2014-01-18 NOTE — Progress Notes (Signed)
Subjective:    Patient ID: Kurt Reid, male    DOB: August 23, 1950, 63 y.o.   MRN: [id number]  HPI  Here to f/u; overall doing ok,  Pt denies chest pain, increased sob or doe, wheezing, orthopnea, PND, increased LE swelling, palpitations, dizziness or syncope.  Pt denies polydipsia, polyuria, or low sugar symptoms such as weakness or confusion improved with po intake.  Pt denies new neurological symptoms such as new headache, or facial or extremity weakness or numbness.   Pt states overall good compliance with meds, has been trying to follow lower cholesterol, diabetic diet, with wt overall stable,  but little exercise however.  Recent a1c 10.4 mid oct 2015, now on toujeo.  Also seeing Dr Ethelene Halramos, now off the morphine, now also on clonidine for mild w/d symtpoms, now with lower BP and fatigue b/c still taking the propranolol with the clonindine though advised to stay off while on the clonidine.. Also on nortyptilene 10 bid starting yesterday.  Pt states "it turned me into a zombie." Does have several wks ongoing nasal allergy symptoms with clearish congestion, itch and sneezing, without fever, pain, ST, cough, swelling or wheezing.   Past Medical History  Diagnosis Date  . Depression   . Bipolar 1 disorder   . ADHD (attention deficit hyperactivity disorder)   . GERD (gastroesophageal reflux disease)   . Hepatitis C   . Hyperlipidemia   . Low back pain     chronic  . Allergic rhinitis   . Seizure disorder     H/O  . Benign prostatic hypertrophy   . Migraine   . ETOH abuse     hx  . Nephrolithiasis     hx  . Sinusitis     recurrent  . Diverticulosis of colon   . Anxiety   . ADHD 09/23/2006  . ALLERGIC RHINITIS 09/23/2006  . ANXIETY 04/19/2009  . BENIGN PROSTATIC HYPERTROPHY 11/02/2007  . BIPOLAR AFFECTIVE DISORDER 09/23/2006  . Chronic pain syndrome 01/18/2009  . DEPRESSION 09/23/2006  . DIVERTICULOSIS, COLON 04/19/2009  . ESOPHAGEAL VARICES 04/19/2009  . GERD 09/23/2006  . HEPATITIS C  09/23/2006  . HYPERLIPIDEMIA 09/23/2006  . INSOMNIA-SLEEP DISORDER-UNSPEC 04/19/2009  . LOW BACK PAIN 09/23/2006  . NEPHROLITHIASIS, HX OF 11/02/2007  . SEIZURE DISORDER 09/23/2006  . 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 Diabetes mellitus without complication   . MRSA (methicillin resistant staph aureus) culture positive    Past Surgical History  Procedure Laterality Date  . Back surgury      x 5  . Knee surgury      x 1  . Cheekbone      hx of fx  . Tonsillectomy    . [place]  . I&d extremity Right 12/10/2013    Procedure: IRRIGATION AND DEBRIDEMENT OF MULTIPLE DOG BITES RIGHT HAND AND WRIST;  Surgeon: Dominica SeverinWilliam Gramig, MD;  Location: [facility] OR;  Service: Orthopedics;  Laterality: Right;    reports that he has quit smoking. He has never used smokeless tobacco. He reports that he does not drink alcohol or use illicit drugs. family history includes Asthma in his daughter; Dementia in his father; Depression in his mother; Hyperlipidemia in his mother; Hypertension in his mother; Stroke in his mother. Allergies  Allergen Reactions  . Duloxetine     REACTION: memory dysfunction, dizziness  . Latex Other (See Comments)    Reaction unknown  . Levofloxacin Nausea Only  .  Mercury Other (See Comments)    Reaction unknown  . Sulfonamide Derivatives Other (See Comments)    Reaction unknown   Current Outpatient Prescriptions on File Prior to Visit  Medication Sig Dispense Refill  . aspirin (ASPIRIN EC) 81 MG EC tablet Take 81 mg by mouth daily.      . B-D UF III MINI PEN NEEDLES 31G X 5 MM MISC USE AS DIRECTED ONCE DAILY 100 each 11  . gabapentin (NEURONTIN) 300 MG capsule Take 600 mg by mouth 2 (two) times daily.    Marland Kitchen. glipiZIDE (GLUCOTROL XL) 10 MG 24 hr tablet Take 10 mg by mouth 2 (two) times daily.    Marland Kitchen. glucose blood (ONE TOUCH ULTRA TEST) test strip USE AS DIRECTED three times daily 250.02 300 each 11  . HYDROcodone-acetaminophen (NORCO)  10-325 MG per tablet Take 1 tablet by mouth 3 (three) times daily as needed for severe pain.    . Insulin Glargine (TOUJEO SOLOSTAR) 300 UNIT/ML SOPN Inject 40 Units into the skin at bedtime. 1.5 mL 11  . lactulose (CHRONULAC) 10 GM/15ML solution Take 10 g by mouth daily as needed for mild constipation or moderate constipation.    . Lancets Misc. (ONE TOUCH SURESOFT) MISC Use as directed once per day 100 each 11  . Lancets MISC Use as directed three times per day 250.02 300 each 11  . metFORMIN (GLUMETZA) 500 MG (MOD) 24 hr tablet Take 2,000 mg by mouth daily with breakfast.    . omeprazole (PRILOSEC) 20 MG capsule Take 20 mg by mouth daily.    . propranolol (INDERAL) 20 MG tablet Take 20 mg by mouth 2 (two) times daily.    Marland Kitchen. spironolactone (ALDACTONE) 50 MG tablet Take 50 mg by mouth daily.    Marland Kitchen. venlafaxine XR (EFFEXOR-XR) 150 MG 24 hr capsule Take 300 mg by mouth daily with breakfast.    . fluticasone (FLONASE) 50 MCG/ACT nasal spray 2 sprays by Nasal route daily. 16 g 2   No current facility-administered medications on file prior to visit.   Review of Systems  Constitutional: Negative for unusual diaphoresis or other sweats  HENT: Negative for ringing in ear Eyes: Negative for double vision or worsening visual disturbance.  Respiratory: Negative for choking and stridor.   Gastrointestinal: Negative for vomiting or other signifcant bowel change Genitourinary: Negative for hematuria or decreased urine volume.  Musculoskeletal: Negative for other MSK pain or swelling Skin: Negative for color change and worsening wound.  Neurological: Negative for tremors and numbness other than noted  Psychiatric/Behavioral: Negative for decreased concentration or agitation other than above       Objective:   Physical Exam BP 108/62 mmHg  Pulse 103  Temp(Src) 98.4 F (36.9 C) (Oral)  Ht 6\' 1"  (1.854 m)  Wt 207 lb (93.895 kg)  BMI 27.32 kg/m2  SpO2 96% VS noted,  Constitutional: Pt appears  well-developed, well-nourished.  HENT: Head: NCAT.  Right Ear: External ear normal.  Left Ear: External ear normal.  Bilat tm's with mild erythema.  Max sinus areas non tender.  Pharynx with mild erythema, no exudate Eyes: . Pupils are equal, round, and reactive to light. Conjunctivae and EOM are normal Neck: Normal range of motion. Neck supple.  Cardiovascular: Normal rate and regular rhythm.   Pulmonary/Chest: Effort normal and breath sounds normal.  Abd:  Soft, NT, ND, + BS Neurological: Pt is alert. At baseline confused , motor grossly intact Skin: Skin is warm. No rash Psychiatric: Pt behavior is normal.  No agitation.     Assessment & Plan:

## 2014-01-18 NOTE — Assessment & Plan Note (Signed)
For flonase asd,  to f/u any worsening symptoms or concerns, mild uncontrolled

## 2014-01-18 NOTE — Assessment & Plan Note (Signed)
For holding proprolol while taking the clonidine, o/w  to f/u any worsening symptoms or concerns

## 2014-01-18 NOTE — Progress Notes (Signed)
Pre visit review using our clinic review tool, if applicable. No additional management support is needed unless otherwise documented below in the visit note. 

## 2014-01-18 NOTE — Patient Instructions (Addendum)
Please take all new medication as prescribed  - the toujeo at 50 units per day  Please continue all other medications as before, and refills have been done if requested - the flonase  OK to hold the propronolol while you are taking the clonidine  Please have the pharmacy call with any other refills you may need.  Please keep your appointments with your specialists as you may have planned  Please return in 3 months, or sooner if needed, with Lab testing done 3-5 days before

## 2014-01-23 ENCOUNTER — Other Ambulatory Visit: Payer: Self-pay | Admitting: Internal Medicine

## 2014-01-28 ENCOUNTER — Other Ambulatory Visit: Payer: Self-pay | Admitting: Internal Medicine

## 2014-02-01 NOTE — Telephone Encounter (Signed)
Done erx 

## 2014-03-04 ENCOUNTER — Other Ambulatory Visit: Payer: Self-pay | Admitting: Internal Medicine

## 2014-03-09 ENCOUNTER — Encounter: Payer: Self-pay | Admitting: Family

## 2014-03-09 ENCOUNTER — Ambulatory Visit (INDEPENDENT_AMBULATORY_CARE_PROVIDER_SITE_OTHER): Payer: Medicare Other | Admitting: Family

## 2014-03-09 VITALS — BP 130/86 | HR 90 | Temp 98.9°F | Resp 18 | Ht 73.0 in | Wt 213.1 lb

## 2014-03-09 DIAGNOSIS — R059 Cough, unspecified: Secondary | ICD-10-CM | POA: Insufficient documentation

## 2014-03-09 DIAGNOSIS — R05 Cough: Secondary | ICD-10-CM

## 2014-03-09 MED ORDER — PROMETHAZINE-DM 6.25-15 MG/5ML PO SYRP: 5.0000 mL | ORAL_SOLUTION | Freq: Four times a day (QID) | ORAL | Status: DC | PRN

## 2014-03-09 MED ORDER — CEFUROXIME AXETIL 250 MG PO TABS: 250.0000 mg | ORAL_TABLET | Freq: Two times a day (BID) | ORAL | Status: DC

## 2014-03-09 NOTE — Progress Notes (Signed)
Pre visit review using our clinic review tool, if applicable. No additional management support is needed unless otherwise documented below in the visit note. 

## 2014-03-09 NOTE — Patient Instructions (Addendum)
Thank you for choosing [facility].  Summary/Instructions:  Your prescription(s) have been submitted to your pharmacy or been printed and provided for you. Please take as directed and contact our office if you believe you are having problem(s) with the medication(s) or have any questions.  If your symptoms worsen or fail to improve, please contact our office for further instruction, or in case of emergency go directly to the emergency room at the closest medical facility.   General Recommendations:    Please drink plenty of fluids.  Get plenty of rest   Sleep in humidified air  Use saline nasal sprays  Netti pot   OTC Medications:  Decongestants - helps relieve congestion   Flonase (generic fluticasone) or Nasacort (generic triamcinolone) - please make sure to use the "cross-over" technique at a 45 degree angle towards the opposite eye as opposed to straight up the nasal passageway.   Sudafed (generic pseudoephedrine - Note this is the one that is available behind the pharmacy counter); Products with phenylephrine (-PE) may also be used but is often not as effective as pseudoephedrine.   If you have HIGH BLOOD PRESSURE - Coricidin HBP; AVOID any product that is -D as this contains pseudoephedrine which may increase your blood pressure.  Afrin (oxymetazoline) every 6-8 hours for up to 3 days.   Allergies - helps relieve runny nose, itchy eyes and sneezing   Claritin (generic loratidine), Allegra (fexofenidine), or Zyrtec (generic cyrterizine) for runny nose. These medications should not cause drowsiness.  Note - Benadryl (generic diphenhydramine) may be used however may cause drowsiness  Cough -   Delsym or Robitussin (generic dextromethorphan)  Expectorants - helps loosen mucus to ease removal   Mucinex (generic guaifenesin) as directed on the package.  Headaches / General Aches   Tylenol (generic acetaminophen) - DO NOT EXCEED 3 grams (3,000 mg) in a 24  hour time period  Advil/Motrin (generic ibuprofen)   Sore Throat -   Salt water gargle   Chloraseptic (generic benzocaine) spray or lozenges / Sucrets (generic dyclonine)      

## 2014-03-09 NOTE — Assessment & Plan Note (Signed)
Symptoms and exam consistent with bacterial sinusitis. Start Ceftin. Start Promethazine DM as needed for sleep and coughing. Discussed over-the-counter medications and continued used for symptom relief. Follow-up if symptoms worsen or fail to improve.

## 2014-03-09 NOTE — Progress Notes (Signed)
Subjective:    Patient ID: Kurt Reid, male    DOB: 10-24-1950, 65 y.o.   MRN: [id number]  Chief Complaint  Patient presents with  . Cough    has had a cough for a couple of months but it started getting worse yesterday, productive cough, chills    HPI:  Kurt Reid is a 64 y.o. male who presents today for an acute visit.   Acute symptoms of productive cough has been going on for a couple of months which has worsened in the last day. States he had a low grade fever this morning. The coughing intensity was enough to keep him up all night. Has been taking flonase as prescribed which it helped for a couple of days and then he stopped taking it.    Allergies  Allergen Reactions  . Duloxetine     REACTION: memory dysfunction, dizziness  . Latex Other (See Comments)    Reaction unknown  . Levofloxacin Nausea Only  . Mercury Other (See Comments)    Reaction unknown  . Sulfonamide Derivatives Other (See Comments)    Reaction unknown    Current Outpatient Prescriptions on File Prior to Visit  Medication Sig Dispense Refill  . aspirin (ASPIRIN EC) 81 MG EC tablet Take 81 mg by mouth daily.      . B-D UF III MINI PEN NEEDLES 31G X 5 MM MISC USE AS DIRECTED ONCE DAILY 100 each 11  . fluticasone (FLONASE) 50 MCG/ACT nasal spray Place 2 sprays into both nostrils daily. 16 g 5  . gabapentin (NEURONTIN) 300 MG capsule Take 600 mg by mouth 2 (two) times daily.    Marland Kitchen glipiZIDE (GLUCOTROL XL) 10 MG 24 hr tablet Take 10 mg by mouth 2 (two) times daily.    Marland Kitchen glucose blood (ONE TOUCH ULTRA TEST) test strip USE AS DIRECTED three times daily 250.02 300 each 11  . HYDROcodone-acetaminophen (NORCO) 10-325 MG per tablet Take 1 tablet by mouth 2 (two) times daily as needed for severe pain.     . Insulin Glargine (TOUJEO SOLOSTAR) 300 UNIT/ML SOPN Inject 50 Units into the skin at bedtime. 1.5 mL 11  . lactulose (CHRONULAC) 10 GM/15ML solution Take 10 g by mouth daily as needed for mild  constipation or moderate constipation.    . Lancets Misc. (ONE TOUCH SURESOFT) MISC Use as directed once per day 100 each 11  . Lancets MISC Use as directed three times per day 250.02 300 each 11  . metFORMIN (GLUMETZA) 500 MG (MOD) 24 hr tablet Take 2,000 mg by mouth daily with breakfast.    . nortriptyline (PAMELOR) 10 MG capsule Take 10 mg by mouth daily.     Marland Kitchen omeprazole (PRILOSEC) 20 MG capsule TAKE ONE CAPSULE BY MOUTH EVERY DAY 90 capsule 2  . propranolol (INDERAL) 20 MG tablet Take 20 mg by mouth 2 (two) times daily.    Marland Kitchen spironolactone (ALDACTONE) 50 MG tablet Take 50 mg by mouth daily.    Marland Kitchen venlafaxine XR (EFFEXOR-XR) 150 MG 24 hr capsule Take 300 mg by mouth daily with breakfast.     No current facility-administered medications on file prior to visit.    Review of Systems  Constitutional: Positive for fever.  HENT: Positive for sinus pressure.   Respiratory: Positive for cough.   Neurological: Positive for headaches.      Objective:    BP 130/86 mmHg  Pulse 90  Temp(Src) 98.9 F (37.2 C) (Oral)  Resp 18  Ht  6\' 1"  (1.854 m)  Wt 213 lb 1.9 oz (96.671 kg)  BMI 28.12 kg/m2  SpO2 94% Nursing note and vital signs reviewed.  Physical Exam  Constitutional: He is oriented to person, place, and time. He appears well-developed and well-nourished. No distress.  HENT:  Right Ear: Hearing, tympanic membrane, external ear and ear canal normal.  Left Ear: Hearing, tympanic membrane, external ear and ear canal normal.  Nose: Right sinus exhibits maxillary sinus tenderness. Right sinus exhibits no frontal sinus tenderness. Left sinus exhibits maxillary sinus tenderness. Left sinus exhibits no frontal sinus tenderness.  Mouth/Throat: Uvula is midline, oropharynx is clear and moist and mucous membranes are normal.  Cardiovascular: Normal rate, regular rhythm, normal heart sounds and intact distal pulses.   Pulmonary/Chest: Effort normal and breath sounds normal.  Neurological: He is  alert and oriented to person, place, and time.  Skin: Skin is warm and dry.  Psychiatric: He has a normal mood and affect. His behavior is normal. Judgment and thought content normal.       Assessment & Plan:

## 2014-04-20 ENCOUNTER — Ambulatory Visit: Payer: Medicare Other | Admitting: Internal Medicine

## 2014-04-21 ENCOUNTER — Encounter: Payer: Self-pay | Admitting: Internal Medicine

## 2014-04-21 ENCOUNTER — Ambulatory Visit (INDEPENDENT_AMBULATORY_CARE_PROVIDER_SITE_OTHER): Payer: Medicare Other | Admitting: Internal Medicine

## 2014-04-21 ENCOUNTER — Other Ambulatory Visit (INDEPENDENT_AMBULATORY_CARE_PROVIDER_SITE_OTHER): Payer: Medicare Other

## 2014-04-21 VITALS — BP 120/80 | HR 68 | Temp 98.8°F | Wt 211.0 lb

## 2014-04-21 DIAGNOSIS — E785 Hyperlipidemia, unspecified: Secondary | ICD-10-CM

## 2014-04-21 DIAGNOSIS — F32A Depression, unspecified: Secondary | ICD-10-CM

## 2014-04-21 DIAGNOSIS — E119 Type 2 diabetes mellitus without complications: Secondary | ICD-10-CM

## 2014-04-21 DIAGNOSIS — F329 Major depressive disorder, single episode, unspecified: Secondary | ICD-10-CM

## 2014-04-21 LAB — CBC WITH DIFFERENTIAL/PLATELET
BASOS ABS: 0.1 10*3/uL (ref 0.0–0.1)
BASOS PCT: 0.8 % (ref 0.0–3.0)
EOS ABS: 0.4 10*3/uL (ref 0.0–0.7)
Eosinophils Relative: 4.5 % (ref 0.0–5.0)
HCT: 43 % (ref 39.0–52.0)
Hemoglobin: 14.9 g/dL (ref 13.0–17.0)
Lymphocytes Relative: 29 % (ref 12.0–46.0)
Lymphs Abs: 2.7 10*3/uL (ref 0.7–4.0)
MCHC: 34.8 g/dL (ref 30.0–36.0)
MCV: 89.1 fl (ref 78.0–100.0)
Monocytes Absolute: 0.8 10*3/uL (ref 0.1–1.0)
Monocytes Relative: 8.4 % (ref 3.0–12.0)
NEUTROS PCT: 57.3 % (ref 43.0–77.0)
Neutro Abs: 5.4 10*3/uL (ref 1.4–7.7)
PLATELETS: 122 10*3/uL — AB (ref 150.0–400.0)
RBC: 4.83 Mil/uL (ref 4.22–5.81)
RDW: 14.5 % (ref 11.5–15.5)
WBC: 9.4 10*3/uL (ref 4.0–10.5)

## 2014-04-21 LAB — BASIC METABOLIC PANEL
BUN: 15 mg/dL (ref 6–23)
CO2: 26 mEq/L (ref 19–32)
Calcium: 8.8 mg/dL (ref 8.4–10.5)
Chloride: 102 mEq/L (ref 96–112)
Creatinine, Ser: 0.82 mg/dL (ref 0.40–1.50)
GFR: 100.48 mL/min (ref >60.00)
GLUCOSE: 398 mg/dL — AB (ref 70–99)
POTASSIUM: 3.8 meq/L (ref 3.5–5.1)
Sodium: 132 mEq/L — ABNORMAL LOW (ref 135–145)

## 2014-04-21 LAB — LIPID PANEL
Cholesterol: 158 mg/dL (ref 0–200)
HDL: 41.4 mg/dL (ref >39.00)
LDL Cholesterol: 81 mg/dL (ref 0–99)
NonHDL: 116.6
TRIGLYCERIDES: 176 mg/dL — AB (ref 0.0–149.0)
Total CHOL/HDL Ratio: 4
VLDL: 35.2 mg/dL (ref 0.0–40.0)

## 2014-04-21 LAB — HEPATIC FUNCTION PANEL
ALBUMIN: 3.1 g/dL — AB (ref 3.5–5.2)
ALT: 36 U/L (ref 0–53)
AST: 42 U/L — ABNORMAL HIGH (ref 0–37)
Alkaline Phosphatase: 155 U/L — ABNORMAL HIGH (ref 39–117)
BILIRUBIN DIRECT: 0.3 mg/dL (ref 0.0–0.3)
TOTAL PROTEIN: 7.2 g/dL (ref 6.0–8.3)
Total Bilirubin: 1.2 mg/dL (ref 0.2–1.2)

## 2014-04-21 LAB — HEMOGLOBIN A1C: Hgb A1c MFr Bld: 12.8 % — ABNORMAL HIGH (ref 4.6–6.5)

## 2014-04-21 MED ORDER — BUPROPION HCL ER (XL) 300 MG PO TB24
300.0000 mg | ORAL_TABLET | Freq: Every day | ORAL | Status: DC
Start: 2014-04-21 — End: 2014-06-02

## 2014-04-21 MED ORDER — VENLAFAXINE HCL ER 225 MG PO TB24: ORAL_TABLET | ORAL | Status: DC

## 2014-04-21 NOTE — Assessment & Plan Note (Signed)
?   Control , for f/u labs today, also refer endo per wife request

## 2014-04-21 NOTE — Progress Notes (Signed)
Pre visit review using our clinic review tool, if applicable. No additional management support is needed unless otherwise documented below in the visit note. 

## 2014-04-21 NOTE — Progress Notes (Signed)
Subjective:    Patient ID: Kurt Reid, male    DOB: 25-Jul-1950, 64 y.o.   MRN: [id number]  HPI  Here to f/u; overall doing ok,  Pt denies chest pain, increased sob or doe, wheezing, orthopnea, PND, increased LE swelling, palpitations, dizziness or syncope.  Pt denies polydipsia, polyuria, or low sugar symptoms such as weakness or confusion improved with po intake.  Pt denies new neurological symptoms such as new headache, or facial or extremity weakness or numbness.   Pt states overall good compliance with meds, has been trying to follow lower cholesterol, diabetic diet, with wt overall stable,  but little exercise however. Sugars have been occas up to 500. Wife asks for endo referral.  Has chronic liver dz, portal HTN,  S/p EGD with variceal banding approx dec 8 per Dr meisinhiemer/GI, will have repeat exam soon.  Also Waiting for insurance approval for the hep C tx per [facility].  Denies worsening reflux, abd pain, dysphagia, n/v, bowel change or blood. Has been out of effoexor as was taking 150 mg - 2 per day, and insur has quant limit #30 per month for this strength. Past Medical History  Diagnosis Date  . Depression   . Bipolar 1 disorder   . ADHD (attention deficit hyperactivity disorder)   . GERD (gastroesophageal reflux disease)   . Hepatitis C   . Hyperlipidemia   . Low back pain     chronic  . Allergic rhinitis   . Seizure disorder     H/O  . Benign prostatic hypertrophy   . Migraine   . ETOH abuse     hx  . Nephrolithiasis     hx  . Sinusitis     recurrent  . Diverticulosis of colon   . Anxiety   . ADHD 09/23/2006  . ALLERGIC RHINITIS 09/23/2006  . ANXIETY 04/19/2009  . BENIGN PROSTATIC HYPERTROPHY 11/02/2007  . BIPOLAR AFFECTIVE DISORDER 09/23/2006  . Chronic pain syndrome 01/18/2009  . DEPRESSION 09/23/2006  . DIVERTICULOSIS, COLON 04/19/2009  . ESOPHAGEAL VARICES 04/19/2009  . GERD 09/23/2006  . HEPATITIS C 09/23/2006  . HYPERLIPIDEMIA 09/23/2006  . INSOMNIA-SLEEP  DISORDER-UNSPEC 04/19/2009  . LOW BACK PAIN 09/23/2006  . NEPHROLITHIASIS, HX OF 11/02/2007  . SEIZURE DISORDER 09/23/2006  . 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 Diabetes mellitus without complication   . MRSA (methicillin resistant staph aureus) culture positive    Past Surgical History  Procedure Laterality Date  . Back surgury      x 5  . Knee surgury      x 1  . Cheekbone      hx of fx  . Tonsillectomy    . S/[place]ury  [place]  . I&d extremity Right 12/10/2013    Procedure: IRRIGATION AND DEBRIDEMENT OF MULTIPLE DOG BITES RIGHT HAND AND WRIST;  Surgeon: Dominica SeverinWilliam Gramig, MD;  Location: [facility] OR;  Service: Orthopedics;  Laterality: Right;    reports that he has quit smoking. He has never used smokeless tobacco. He reports that he does not drink alcohol or use illicit drugs. family history includes Asthma in his daughter; Dementia in his father; Depression in his mother; Hyperlipidemia in his mother; Hypertension in his mother; Stroke in his mother. Allergies  Allergen Reactions  . Duloxetine     REACTION: memory dysfunction, dizziness  . Latex Other (See Comments)    Reaction unknown  . Levofloxacin Nausea Only  . Mercury Other (  See Comments)    Reaction unknown  . Sulfonamide Derivatives Other (See Comments)    Reaction unknown   Current Outpatient Prescriptions on File Prior to Visit  Medication Sig Dispense Refill  . aspirin (ASPIRIN EC) 81 MG EC tablet Take 81 mg by mouth daily.      . B-D UF III MINI PEN NEEDLES 31G X 5 MM MISC USE AS DIRECTED ONCE DAILY 100 each 11  . fluticasone (FLONASE) 50 MCG/ACT nasal spray Place 2 sprays into both nostrils daily. 16 g 5  . gabapentin (NEURONTIN) 300 MG capsule Take 600 mg by mouth 2 (two) times daily.    Marland Kitchen glipiZIDE (GLUCOTROL XL) 10 MG 24 hr tablet Take 10 mg by mouth 2 (two) times daily.    Marland Kitchen glucose blood (ONE TOUCH ULTRA TEST) test strip USE AS DIRECTED three times daily 250.02 300 each 11    . HYDROcodone-acetaminophen (NORCO) 10-325 MG per tablet Take 1 tablet by mouth 2 (two) times daily as needed for severe pain.     . Insulin Glargine (TOUJEO SOLOSTAR) 300 UNIT/ML SOPN Inject 50 Units into the skin at bedtime. 1.5 mL 11  . lactulose (CHRONULAC) 10 GM/15ML solution Take 10 g by mouth daily as needed for mild constipation or moderate constipation.    . Lancets Misc. (ONE TOUCH SURESOFT) MISC Use as directed once per day 100 each 11  . Lancets MISC Use as directed three times per day 250.02 300 each 11  . metFORMIN (GLUMETZA) 500 MG (MOD) 24 hr tablet Take 2,000 mg by mouth daily with breakfast.    . nortriptyline (PAMELOR) 10 MG capsule Take 10 mg by mouth daily as needed.     Marland Kitchen omeprazole (PRILOSEC) 20 MG capsule TAKE ONE CAPSULE BY MOUTH EVERY DAY 90 capsule 2  . propranolol (INDERAL) 20 MG tablet Take 20 mg by mouth 2 (two) times daily.    Marland Kitchen spironolactone (ALDACTONE) 50 MG tablet Take 50 mg by mouth daily.    Marland Kitchen venlafaxine XR (EFFEXOR-XR) 150 MG 24 hr capsule Take 300 mg by mouth daily with breakfast.     No current facility-administered medications on file prior to visit.    Review of Systems  Constitutional: Negative for unusual diaphoresis or other sweats  HENT: Negative for ringing in ear Eyes: Negative for double vision or worsening visual disturbance.  Respiratory: Negative for choking and stridor.   Gastrointestinal: Negative for vomiting or other signifcant bowel change Genitourinary: Negative for hematuria or decreased urine volume.  Musculoskeletal: Negative for other MSK pain or swelling Skin: Negative for color change and worsening wound.  Neurological: Negative for tremors and numbness other than noted  Psychiatric/Behavioral: Negative for decreased concentration or agitation other than above       Objective:   Physical Exam BP 120/80 mmHg  Pulse 68  Temp(Src) 98.8 F (37.1 C) (Oral)  Wt 211 lb (95.709 kg) VS noted,  Constitutional: Pt appears  well-developed, well-nourished.  HENT: Head: NCAT.  Right Ear: External ear normal.  Left Ear: External ear normal.  Eyes: . Pupils are equal, round, and reactive to light. Conjunctivae and EOM are normal Neck: Normal range of motion. Neck supple.  Cardiovascular: Normal rate and regular rhythm.   Pulmonary/Chest: Effort normal and breath sounds without rales or wheezing.  Abd:  Soft, NT, ND, + BS Neurological: Pt is alert. Not confused , motor grossly intact Skin: Skin is warm. No rash Psychiatric: Pt behavior is normal. No agitation.     Assessment &  Plan:

## 2014-04-21 NOTE — Assessment & Plan Note (Signed)
stable overall by history and exam, recent data reviewed with pt, and pt to continue medical treatment as before,  to f/u any worsening symptoms or concerns Lab Results  Component Value Date   LDLCALC 79 10/05/2013

## 2014-04-21 NOTE — Patient Instructions (Signed)
Please take all new medication as prescribed - the effexor ER at 225 mg per day  Please disregard the rx sent for wellbutrin ER 300 qd  Please continue all other medications as before, and refills have been done if requested.  Please have the pharmacy call with any other refills you may need.  Please continue your efforts at being more active, low cholesterol diet, and weight control.  You are otherwise up to date with prevention measures today.  Please keep your appointments with your specialists as you may have planned  You will be contacted regarding the referral for: endocrinology  Please go to the LAB in the Basement (turn left off the elevator) for the tests to be done today  You will be contacted by phone if any changes need to be made immediately.  Otherwise, you will receive a letter about your results with an explanation, but please check with MyChart first.  Please remember to sign up for MyChart if you have not done so, as this will be important to you in the future with finding out test results, communicating by private email, and scheduling acute appointments online when needed.  Please return in 6 months, or sooner if needed, with Lab testing done 3-5 days before

## 2014-04-21 NOTE — Addendum Note (Signed)
Addended by: Corwin LevinsJOHN, Chinedum Vanhouten W on: 04/21/2014 06:55 PM   Modules accepted: Orders

## 2014-04-21 NOTE — Assessment & Plan Note (Signed)
Ok to re-start the effexor at 1 qd at 225 mg ER;  to f/u any worsening symptoms or concerns

## 2014-05-19 ENCOUNTER — Ambulatory Visit: Payer: Medicare Other | Admitting: Endocrinology

## 2014-05-23 ENCOUNTER — Ambulatory Visit (INDEPENDENT_AMBULATORY_CARE_PROVIDER_SITE_OTHER): Payer: Medicare Other | Admitting: Endocrinology

## 2014-05-23 ENCOUNTER — Encounter: Payer: Self-pay | Admitting: Endocrinology

## 2014-05-23 VITALS — BP 132/84 | HR 85 | Temp 98.4°F | Ht 73.0 in | Wt 211.0 lb

## 2014-05-23 DIAGNOSIS — E119 Type 2 diabetes mellitus without complications: Secondary | ICD-10-CM

## 2014-05-23 MED ORDER — AUTOLET II CLINISAFE KIT: 1.0000 | PACK | Freq: Once | Status: DC

## 2014-05-23 MED ORDER — INSULIN GLARGINE 300 UNIT/ML ~~LOC~~ SOPN: 80.0000 [IU] | PEN_INJECTOR | SUBCUTANEOUS | Status: DC

## 2014-05-23 NOTE — Progress Notes (Signed)
Subjective:    Patient ID: Kurt Reid, male    DOB: April 11, 1950, 64 y.o.   MRN: [id number]  HPI Hx is from pt and wife.  DM was dx'ed in 2012; he has mild if any neuropathy of the lower extremities; he is unaware of any associated chronic complications; he has been on insulin since 2014; pt says his diet and exercise are poor; he has never had pancreatitis, severe hypoglycemia or DKA.  He takes toujeo and 2 oral meds.  He seldom checks cbg's.  When he does, it is usually 300-500.   Past Medical History  Diagnosis Date  . Depression   . Bipolar 1 disorder   . ADHD (attention deficit hyperactivity disorder)   . GERD (gastroesophageal reflux disease)   . Hepatitis C   . Hyperlipidemia   . Low back pain     chronic  . Allergic rhinitis   . Seizure disorder     H/O  . Benign prostatic hypertrophy   . Migraine   . ETOH abuse     hx  . Nephrolithiasis     hx  . Sinusitis     recurrent  . Diverticulosis of colon   . Anxiety   . ADHD 09/23/2006  . ALLERGIC RHINITIS 09/23/2006  . ANXIETY 04/19/2009  . BENIGN PROSTATIC HYPERTROPHY 11/02/2007  . BIPOLAR AFFECTIVE DISORDER 09/23/2006  . Chronic pain syndrome 01/18/2009  . DEPRESSION 09/23/2006  . DIVERTICULOSIS, COLON 04/19/2009  . ESOPHAGEAL VARICES 04/19/2009  . GERD 09/23/2006  . HEPATITIS C 09/23/2006  . HYPERLIPIDEMIA 09/23/2006  . INSOMNIA-SLEEP DISORDER-UNSPEC 04/19/2009  . LOW BACK PAIN 09/23/2006  . NEPHROLITHIASIS, HX OF 11/02/2007  . SEIZURE DISORDER 09/23/2006  . 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 Diabetes mellitus without complication   . MRSA (methicillin resistant staph aureus) culture positive     Past Surgical History  Procedure Laterality Date  . Back surgury      x 5  . Knee surgury      x 1  . Cheekbone      hx of fx  . Tonsillectomy    . [place]  . I&d extremity Right 12/10/2013    Procedure: IRRIGATION AND DEBRIDEMENT OF MULTIPLE DOG BITES RIGHT HAND  AND WRIST;  Surgeon: Dominica SeverinWilliam Gramig, MD;  Location: [facility];  Service: Orthopedics;  Laterality: Right;    History   Social History  . Marital Status: Married    Spouse Name: N/A  . Number of Children: 3  . Years of Education: N/A   Occupational History  . used to work for [employer], now disabled due to back pain    Social History Main Topics  . Smoking status: Former Games developermoker  . Smokeless tobacco: Never Used  . Alcohol Use: No  . Drug Use: No  . Sexual Activity: Not Currently   Other Topics Concern  . Not on file   Social History Narrative    Current Outpatient Prescriptions on File Prior to Visit  Medication Sig Dispense Refill  . aspirin (ASPIRIN EC) 81 MG EC tablet Take 81 mg by mouth daily.      . B-D UF III MINI PEN NEEDLES 31G X 5 MM MISC USE AS DIRECTED ONCE DAILY 100 each 11  . glipiZIDE (GLUCOTROL XL) 10 MG 24 hr tablet Take 10 mg by mouth 2 (two) times daily.    Marland Kitchen. glucose blood (ONE TOUCH ULTRA TEST) test  strip USE AS DIRECTED three times daily 250.02 300 each 11  . HYDROcodone-acetaminophen (NORCO) 10-325 MG per tablet Take 1 tablet by mouth 2 (two) times daily as needed for severe pain.     Marland Kitchen lactulose (CHRONULAC) 10 GM/15ML solution Take 10 g by mouth daily as needed for mild constipation or moderate constipation.    . Lancets Misc. (ONE TOUCH SURESOFT) MISC Use as directed once per day 100 each 11  . Lancets MISC Use as directed three times per day 250.02 300 each 11  . metFORMIN (GLUMETZA) 500 MG (MOD) 24 hr tablet Take 2,000 mg by mouth daily with breakfast.    . omeprazole (PRILOSEC) 20 MG capsule TAKE ONE CAPSULE BY MOUTH EVERY DAY 90 capsule 2  . propranolol (INDERAL) 20 MG tablet Take 20 mg by mouth 2 (two) times daily.    Marland Kitchen spironolactone (ALDACTONE) 50 MG tablet Take 50 mg by mouth daily.    . Venlafaxine HCl 225 MG TB24 1 tab  By mouth daily 90 each 3  . buPROPion (WELLBUTRIN XL) 300 MG 24 hr tablet Take 1 tablet (300 mg total) by mouth daily.  (Patient not taking: Reported on 05/23/2014) 90 tablet 3  . fluticasone (FLONASE) 50 MCG/ACT nasal spray Place 2 sprays into both nostrils daily. (Patient not taking: Reported on 05/23/2014) 16 g 5  . gabapentin (NEURONTIN) 300 MG capsule Take 600 mg by mouth 2 (two) times daily.    . nortriptyline (PAMELOR) 10 MG capsule Take 10 mg by mouth daily as needed.      No current facility-administered medications on file prior to visit.    Allergies  Allergen Reactions  . Duloxetine     REACTION: memory dysfunction, dizziness  . Latex Other (See Comments)    Reaction unknown  . Levofloxacin Nausea Only  . Mercury Other (See Comments)    Reaction unknown  . Sulfonamide Derivatives Other (See Comments)    Reaction unknown    Family History  Problem Relation Age of Onset  . Stroke Mother   . Hyperlipidemia Mother   . Depression Mother   . Hypertension Mother   . Asthma Daughter   . Dementia Father   . Diabetes Neg Hx     BP 132/84 mmHg  Pulse 85  Temp(Src) 98.4 F (36.9 C) (Oral)  Ht  (1.854 m)  Wt 211 lb (95.709 kg)  BMI 27.84 kg/m2  SpO2 95%  Review of Systems denies blurry vision, headache, chest pain, sob, n/v, muscle cramps, excessive diaphoresis, depression, and easy bruising.  He has lost a few lbs.  He has nocturia, cold intolerance, rhinorrhea, and chronic depression.      Objective:   Physical Exam VITAL SIGNS:  See vs page GENERAL: no distress Pulses: dorsalis pedis intact bilat.   MSK: no deformity of the feet CV: no leg edema Skin:  no ulcer on the feet.  normal color and temp on the feet. Neuro: sensation is intact to touch on the feet  i personally reviewed electrocardiogram tracing (12/14/13): RBBB  Lab Results  Component Value Date   HGBA1C 12.8* 04/21/2014      Assessment & Plan:  DM: severe exacerbation. Bipolar disorder, new to me: this may be complicating the rx of DM.  In this setting, he may be candidate for a simple insulin regimen.   We'll follow this.   Weight loss, prob due to severe hyperglycemia: i advised pt to not regain as glycemic control improves.   Patient is advised  the following: Patient Instructions  good diet and exercise habits significanly improve the control of your diabetes.  please let me know if you wish to be referred to a dietician.  high blood sugar is very risky to your health.  you should see an eye doctor and dentist every year.  It is very important to get all recommended vaccinations.  controlling your blood pressure and cholesterol drastically reduces the damage diabetes does to your body.  Those who smoke should quit.  please discuss these with your doctor.  check your blood sugar twice a day.  vary the time of day when you check, between before the 3 meals, and at bedtime.  also check if you have symptoms of your blood sugar being too high or too low.  please keep a record of the readings and bring it to your next appointment here.  You can write it on any piece of paper.  please call us sooner if your blood sugar goes below 70, or if you have a lot of readings over 200.   i have sent a prescription to your pharmacy, for a device to help you check your blood sugar.  Please increase the toujeo to 80 units each morning, and: Please continue the same diabetes pills for now (but we'll stop these later).  Please come back for a follow-up appointment in 3 weeks.  Please see our dietician the same day.

## 2014-05-23 NOTE — Patient Instructions (Addendum)
good diet and exercise habits significanly improve the control of your diabetes.  please let me know if you wish to be referred to a dietician.  high blood sugar is very risky to your health.  you should see an eye doctor and dentist every year.  It is very important to get all recommended vaccinations.  controlling your blood pressure and cholesterol drastically reduces the damage diabetes does to your body.  Those who smoke should quit.  please discuss these with your doctor.  check your blood sugar twice a day.  vary the time of day when you check, between before the 3 meals, and at bedtime.  also check if you have symptoms of your blood sugar being too high or too low.  please keep a record of the readings and bring it to your next appointment here.  You can write it on any piece of paper.  please call us sooner if your blood sugar goes below 70, or if you have a lot of readings over 200.   i have sent a prescription to your pharmacy, for a device to help you check your blood sugar.  Please increase the toujeo to 80 units each morning, and: Please continue the same diabetes pills for now (but we'll stop these later).  Please come back for a follow-up appointment in 3 weeks.  Please see our dietician the same day.

## 2014-05-31 ENCOUNTER — Telehealth: Payer: Self-pay | Admitting: Internal Medicine

## 2014-05-31 NOTE — Telephone Encounter (Signed)
Pt called stated that Venlafaxine HCl 225 MG need to start PA on this med. Pt is out for a week now and havent heard anything from our office. Please call pt back.

## 2014-06-02 ENCOUNTER — Encounter: Payer: Self-pay | Admitting: Internal Medicine

## 2014-06-02 ENCOUNTER — Ambulatory Visit (INDEPENDENT_AMBULATORY_CARE_PROVIDER_SITE_OTHER): Payer: Medicare Other | Admitting: Internal Medicine

## 2014-06-02 VITALS — BP 124/80 | HR 83 | Temp 99.3°F | Resp 18 | Ht 73.0 in | Wt 214.1 lb

## 2014-06-02 DIAGNOSIS — N62 Hypertrophy of breast: Secondary | ICD-10-CM | POA: Insufficient documentation

## 2014-06-02 DIAGNOSIS — B182 Chronic viral hepatitis C: Secondary | ICD-10-CM

## 2014-06-02 DIAGNOSIS — F411 Generalized anxiety disorder: Secondary | ICD-10-CM

## 2014-06-02 DIAGNOSIS — K7460 Unspecified cirrhosis of liver: Secondary | ICD-10-CM

## 2014-06-02 NOTE — Patient Instructions (Signed)
Please continue all other medications as before, and refills have been done if requested.  Please have the pharmacy call with any other refills you may need.  Please keep your appointments with your specialists as you may have planned, including Dr Everardo AllEllison for DM and the new problem of gyneocomastia

## 2014-06-02 NOTE — Telephone Encounter (Signed)
[facility] [phone] waiting on approval status...Raechel Chute/lmb

## 2014-06-02 NOTE — Assessment & Plan Note (Signed)
D/w pt and wife, most likely related to aldactone, but cant r/o other, has appt apr 15 with endo and I encouraged f/u at that time

## 2014-06-02 NOTE — Assessment & Plan Note (Signed)
stable overall by history and exam, recent data reviewed with pt, and pt to continue medical treatment as before,  to f/u any worsening symptoms or concerns Lab Results  Component Value Date   WBC 9.4 04/21/2014   HGB 14.9 04/21/2014   HCT 43.0 04/21/2014   PLT 122.0* 04/21/2014   GLUCOSE 398* 04/21/2014   CHOL 158 04/21/2014   TRIG 176.0* 04/21/2014   HDL 41.40 04/21/2014   LDLDIRECT 81.5 11/02/2007   LDLCALC 81 04/21/2014   ALT 36 04/21/2014   AST 42* 04/21/2014   NA 132* 04/21/2014   K 3.8 04/21/2014   CL 102 04/21/2014   CREATININE 0.82 04/21/2014   BUN 15 04/21/2014   CO2 26 04/21/2014   TSH 1.02 10/05/2013   PSA 0.02* 10/05/2013   HGBA1C 12.8* 04/21/2014

## 2014-06-02 NOTE — Assessment & Plan Note (Signed)
Despite side effect, pt should cont aldactone for now,  to f/u any worsening symptoms or concerns

## 2014-06-02 NOTE — Progress Notes (Signed)
Subjective:    Patient ID: Kurt Reid, male    DOB: 1950-09-05, 64 y.o.   MRN: [id number]  HPI  Here to f/u, unfortunately has developed 1-2 mo worsening bilat tender gynecomastia like breast enlargement, without skin change, mass or nipple d/c.  Pt denies chest pain, increased sob or doe, wheezing, orthopnea, PND, increased LE swelling, palpitations, dizziness or syncope. Pt denies new neurological symptoms such as new headache, or facial or extremity weakness or numbness   Pt denies polydipsia, polyuria.     Is on chronic aldactone  Denies worsening depressive symptoms, suicidal ideation, or panic; has ongoing anxiety, not more lately Past Medical History  Diagnosis Date  . Depression   . Bipolar 1 disorder   . ADHD (attention deficit hyperactivity disorder)   . GERD (gastroesophageal reflux disease)   . Hepatitis C   . Hyperlipidemia   . Low back pain     chronic  . Allergic rhinitis   . Seizure disorder     H/O  . Benign prostatic hypertrophy   . Migraine   . ETOH abuse     hx  . Nephrolithiasis     hx  . Sinusitis     recurrent  . Diverticulosis of colon   . Anxiety   . ADHD 09/23/2006  . ALLERGIC RHINITIS 09/23/2006  . ANXIETY 04/19/2009  . BENIGN PROSTATIC HYPERTROPHY 11/02/2007  . BIPOLAR AFFECTIVE DISORDER 09/23/2006  . Chronic pain syndrome 01/18/2009  . DEPRESSION 09/23/2006  . DIVERTICULOSIS, COLON 04/19/2009  . ESOPHAGEAL VARICES 04/19/2009  . GERD 09/23/2006  . HEPATITIS C 09/23/2006  . HYPERLIPIDEMIA 09/23/2006  . INSOMNIA-SLEEP DISORDER-UNSPEC 04/19/2009  . LOW BACK PAIN 09/23/2006  . NEPHROLITHIASIS, HX OF 11/02/2007  . SEIZURE DISORDER 09/23/2006  . 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 Diabetes mellitus without complication   . MRSA (methicillin resistant staph aureus) culture positive    Past Surgical History  Procedure Laterality Date  . Back surgury      x 5  . Knee surgury      x 1  . Cheekbone      hx of fx  . Tonsillectomy    .  S/p sinus surgury  2010    Dr. Phebe Colla  . I&d extremity Right 12/10/2013    Procedure: IRRIGATION AND DEBRIDEMENT OF MULTIPLE DOG BITES RIGHT HAND AND WRIST;  Surgeon: Roseanne Kaufman, MD;  Location: [facility];  Service: Orthopedics;  Laterality: Right;    reports that he has quit smoking. He has never used smokeless tobacco. He reports that he does not drink alcohol or use illicit drugs. family history includes Asthma in his daughter; Dementia in his father; Depression in his mother; Hyperlipidemia in his mother; Hypertension in his mother; Stroke in his mother. There is no history of Diabetes. Allergies  Allergen Reactions  . Duloxetine     REACTION: memory dysfunction, dizziness  . Latex Other (See Comments)    Reaction unknown  . Levofloxacin Nausea Only  . Mercury Other (See Comments)    Reaction unknown  . Sulfonamide Derivatives Other (See Comments)    Reaction unknown   Current Outpatient Prescriptions on File Prior to Visit  Medication Sig Dispense Refill  . aspirin (ASPIRIN EC) 81 MG EC tablet Take 81 mg by mouth daily.      . B-D UF III MINI PEN NEEDLES 31G X 5 MM MISC USE AS DIRECTED ONCE DAILY 100 each 11  . fluticasone (FLONASE) 50 MCG/ACT nasal  spray Place 2 sprays into both nostrils daily. 16 g 5  . gabapentin (NEURONTIN) 300 MG capsule Take 600 mg by mouth 2 (two) times daily.    Marland Kitchen glipiZIDE (GLUCOTROL XL) 10 MG 24 hr tablet Take 10 mg by mouth 2 (two) times daily.    Marland Kitchen glucose blood (ONE TOUCH ULTRA TEST) test strip USE AS DIRECTED three times daily 250.02 300 each 11  . HYDROcodone-acetaminophen (NORCO) 10-325 MG per tablet Take 1 tablet by mouth 2 (two) times daily as needed for severe pain.     . Insulin Glargine (TOUJEO SOLOSTAR) 300 UNIT/ML SOPN Inject 80 Units into the skin every morning. 3 mL 11  . lactulose (CHRONULAC) 10 GM/15ML solution Take 10 g by mouth daily as needed for mild constipation or moderate constipation.    . Lancets Misc. (AUTOLET II CLINISAFE)  KIT 1 Device by Does not apply route once. Any brand ok 1 each 0  . Lancets Misc. (ONE TOUCH SURESOFT) MISC Use as directed once per day 100 each 11  . Lancets MISC Use as directed three times per day 250.02 300 each 11  . metFORMIN (GLUMETZA) 500 MG (MOD) 24 hr tablet Take 2,000 mg by mouth daily with breakfast.    . nortriptyline (PAMELOR) 10 MG capsule Take 10 mg by mouth daily as needed.     Marland Kitchen omeprazole (PRILOSEC) 20 MG capsule TAKE ONE CAPSULE BY MOUTH EVERY DAY 90 capsule 2  . propranolol (INDERAL) 20 MG tablet Take 20 mg by mouth 2 (two) times daily.    Marland Kitchen spironolactone (ALDACTONE) 50 MG tablet Take 50 mg by mouth daily.    . Venlafaxine HCl 225 MG TB24 1 tab  By mouth daily 90 each 3   No current facility-administered medications on file prior to visit.    Review of Systems  Constitutional: Negative for unusual diaphoresis or night sweats HENT: Negative for ringing in ear or discharge Eyes: Negative for double vision or worsening visual disturbance.  Respiratory: Negative for choking and stridor.   Gastrointestinal: Negative for vomiting or other signifcant bowel change Genitourinary: Negative for hematuria or change in urine volume.  Musculoskeletal: Negative for other MSK pain or swelling Skin: Negative for color change and worsening wound.  Neurological: Negative for tremors and numbness other than noted  Psychiatric/Behavioral: Negative for decreased concentration or agitation other than above       Objective:   Physical Exam BP 124/80 mmHg  Pulse 83  Temp(Src) 99.3 F (37.4 C) (Oral)  Resp 18  Ht _0  (1.854 m)  Wt 214 lb 1.9 oz (97.124 kg)  BMI 28.26 kg/m2  SpO2 95% VS noted,  Constitutional: Pt appears in no significant distress HENT: Head: NCAT.  Right Ear: External ear normal.  Left Ear: External ear normal.  Eyes: . Pupils are equal, round, and reactive to light. Conjunctivae and EOM are normal Neck: Normal range of motion. Neck supple.  Cardiovascular:  Normal rate and regular rhythm.   Pulmonary/Chest: Effort normal and breath sounds without rales or wheezing.  Abd:  Soft, NT, ND, + BS Breasts: mild breast enlargement bilat,mild tender, no mass, skin change or nipple d/c Neurological: Pt is alert. Not confused , motor grossly intact Skin: Skin is warm. No rash, no LE edema Psychiatric: Pt behavior is normal. No agitation. not overaly nervous today, not depressed affect    Assessment & Plan:

## 2014-06-02 NOTE — Progress Notes (Signed)
Pre visit review using our clinic review tool, if applicable. No additional management support is needed unless otherwise documented below in the visit note. 

## 2014-06-02 NOTE — Telephone Encounter (Signed)
Called [facility] to verify PA we haven't received was given insurance # to call. Pt has [employer] [phone] spoke with Cassie faxing over PA form to be completed...Raechel Chute/lmb

## 2014-06-03 ENCOUNTER — Telehealth: Payer: Self-pay | Admitting: Internal Medicine

## 2014-06-03 DIAGNOSIS — E119 Type 2 diabetes mellitus without complications: Secondary | ICD-10-CM

## 2014-06-03 NOTE — Telephone Encounter (Signed)
Patient's spouse called requesting a new endocrinology referral. They would like to go to Dr. Talmage NapBalan. Please enter new referral. Thank you!

## 2014-06-04 NOTE — Telephone Encounter (Signed)
Referral done

## 2014-06-08 DIAGNOSIS — Z0279 Encounter for issue of other medical certificate: Secondary | ICD-10-CM

## 2014-06-08 NOTE — Telephone Encounter (Signed)
Called to check status on PA med was approved. Starting 06/02/14-06/02/15. Notified pharmacy spoke with Tiffany gave approval status...Raechel Chute/lmb

## 2014-06-09 ENCOUNTER — Telehealth: Payer: Self-pay | Admitting: Internal Medicine

## 2014-06-09 NOTE — Telephone Encounter (Signed)
Already ordered April 2

## 2014-06-09 NOTE — Telephone Encounter (Signed)
Patient was referred do Dr. Everardo AllEllison and patient wants to see another endocrinologist. [facility] [phone]. Insurance is saying she will need a referral to see her.

## 2014-06-10 NOTE — Telephone Encounter (Signed)
Called pt/wife no answer LM[facility] referral has been place...Raechel Chute/lmb

## 2014-06-17 ENCOUNTER — Ambulatory Visit: Payer: Medicare Other | Admitting: Endocrinology

## 2014-06-17 ENCOUNTER — Encounter: Payer: Medicare Other | Admitting: Dietician

## 2014-06-17 DIAGNOSIS — Z0289 Encounter for other administrative examinations: Secondary | ICD-10-CM

## 2014-06-27 ENCOUNTER — Telehealth: Payer: Self-pay

## 2014-06-27 NOTE — Telephone Encounter (Signed)
Patient called to educate on Medicare Wellness apt. LVM for the patient to call back to educate and schedule for wellness visit.   

## 2014-07-18 ENCOUNTER — Telehealth: Payer: Self-pay | Admitting: Internal Medicine

## 2014-07-18 MED ORDER — INSULIN GLARGINE 300 UNIT/ML ~~LOC~~ SOPN: 80.0000 [IU] | PEN_INJECTOR | SUBCUTANEOUS | Status: DC

## 2014-07-18 NOTE — Telephone Encounter (Signed)
[facility].../lmb

## 2014-07-18 NOTE — Telephone Encounter (Signed)
Patient is requesting script for insulin glargine to [facility] [address] [facility] [place].  Patient states they do not have an updated script with correct dose. [facility] 40 units in the morning and not 80 units.  Patient is not seeing Ellison anymore.

## 2014-08-06 ENCOUNTER — Other Ambulatory Visit: Payer: Self-pay | Admitting: Internal Medicine

## 2014-09-14 ENCOUNTER — Telehealth: Payer: Self-pay | Admitting: Internal Medicine

## 2014-09-14 NOTE — Telephone Encounter (Signed)
Rec'd [facility] forward 5 pages to Dr. Jonny RuizJohn

## 2014-10-20 ENCOUNTER — Ambulatory Visit: Payer: Medicare Other | Admitting: Internal Medicine

## 2014-10-25 ENCOUNTER — Other Ambulatory Visit: Payer: Self-pay

## 2014-10-25 MED ORDER — GABAPENTIN 300 MG PO CAPS: 600.0000 mg | ORAL_CAPSULE | Freq: Two times a day (BID) | ORAL | Status: DC

## 2014-10-27 ENCOUNTER — Ambulatory Visit (INDEPENDENT_AMBULATORY_CARE_PROVIDER_SITE_OTHER): Payer: Medicare Other | Admitting: Internal Medicine

## 2014-10-27 ENCOUNTER — Encounter: Payer: Self-pay | Admitting: Internal Medicine

## 2014-10-27 ENCOUNTER — Other Ambulatory Visit (INDEPENDENT_AMBULATORY_CARE_PROVIDER_SITE_OTHER): Payer: Medicare Other

## 2014-10-27 VITALS — BP 120/80 | HR 76 | Temp 98.7°F | Ht 73.0 in | Wt 225.0 lb

## 2014-10-27 DIAGNOSIS — Z0000 Encounter for general adult medical examination without abnormal findings: Secondary | ICD-10-CM

## 2014-10-27 DIAGNOSIS — E119 Type 2 diabetes mellitus without complications: Secondary | ICD-10-CM | POA: Diagnosis not present

## 2014-10-27 LAB — URINALYSIS, ROUTINE W REFLEX MICROSCOPIC
BILIRUBIN URINE: NEGATIVE
KETONES UR: NEGATIVE
LEUKOCYTES UA: NEGATIVE
NITRITE: NEGATIVE
PH: 6 (ref 5.0–8.0)
SPECIFIC GRAVITY, URINE: 1.02 (ref 1.000–1.030)
TOTAL PROTEIN, URINE-UPE24: NEGATIVE
Urine Glucose: 500 — AB
Urobilinogen, UA: 1 (ref 0.0–1.0)

## 2014-10-27 LAB — CBC WITH DIFFERENTIAL/PLATELET
BASOS PCT: 0.5 % (ref 0.0–3.0)
Basophils Absolute: 0.1 10*3/uL (ref 0.0–0.1)
EOS PCT: 5.1 % — AB (ref 0.0–5.0)
Eosinophils Absolute: 0.6 10*3/uL (ref 0.0–0.7)
HCT: 43 % (ref 39.0–52.0)
Hemoglobin: 14.6 g/dL (ref 13.0–17.0)
LYMPHS ABS: 2.7 10*3/uL (ref 0.7–4.0)
Lymphocytes Relative: 22.2 % (ref 12.0–46.0)
MCHC: 33.8 g/dL (ref 30.0–36.0)
MCV: 91.6 fl (ref 78.0–100.0)
MONO ABS: 1.1 10*3/uL — AB (ref 0.1–1.0)
Monocytes Relative: 9.5 % (ref 3.0–12.0)
NEUTROS ABS: 7.5 10*3/uL (ref 1.4–7.7)
NEUTROS PCT: 62.7 % (ref 43.0–77.0)
Platelets: 128 10*3/uL — ABNORMAL LOW (ref 150.0–400.0)
RBC: 4.69 Mil/uL (ref 4.22–5.81)
RDW: 14.4 % (ref 11.5–15.5)
WBC: 12 10*3/uL — ABNORMAL HIGH (ref 4.0–10.5)

## 2014-10-27 LAB — MICROALBUMIN / CREATININE URINE RATIO
CREATININE, U: 139.9 mg/dL
MICROALB UR: 0.7 mg/dL (ref 0.0–1.9)
Microalb Creat Ratio: 0.5 mg/g (ref 0.0–30.0)

## 2014-10-27 LAB — BASIC METABOLIC PANEL
BUN: 10 mg/dL (ref 6–23)
CHLORIDE: 106 meq/L (ref 96–112)
CO2: 29 meq/L (ref 19–32)
Calcium: 8.7 mg/dL (ref 8.4–10.5)
Creatinine, Ser: 0.88 mg/dL (ref 0.40–1.50)
GFR: 92.47 mL/min (ref >60.00)
GLUCOSE: 135 mg/dL — AB (ref 70–99)
POTASSIUM: 4.1 meq/L (ref 3.5–5.1)
Sodium: 140 mEq/L (ref 135–145)

## 2014-10-27 LAB — LIPID PANEL
Cholesterol: 143 mg/dL (ref 0–200)
HDL: 47.2 mg/dL (ref >39.00)
LDL CALC: 78 mg/dL (ref 0–99)
NONHDL: 96.18
Total CHOL/HDL Ratio: 3
Triglycerides: 91 mg/dL (ref 0.0–149.0)
VLDL: 18.2 mg/dL (ref 0.0–40.0)

## 2014-10-27 LAB — HEPATIC FUNCTION PANEL
ALBUMIN: 3.3 g/dL — AB (ref 3.5–5.2)
ALT: 19 U/L (ref 0–53)
AST: 28 U/L (ref 0–37)
Alkaline Phosphatase: 130 U/L — ABNORMAL HIGH (ref 39–117)
BILIRUBIN TOTAL: 1.6 mg/dL — AB (ref 0.2–1.2)
Bilirubin, Direct: 0.4 mg/dL — ABNORMAL HIGH (ref 0.0–0.3)
Total Protein: 7 g/dL (ref 6.0–8.3)

## 2014-10-27 LAB — TSH: TSH: 2.27 u[IU]/mL (ref 0.35–4.50)

## 2014-10-27 LAB — HEMOGLOBIN A1C: Hgb A1c MFr Bld: 8.3 % — ABNORMAL HIGH (ref 4.6–6.5)

## 2014-10-27 LAB — PSA: PSA: 0.05 ng/mL — ABNORMAL LOW (ref 0.10–4.00)

## 2014-10-27 MED ORDER — PROPRANOLOL HCL 20 MG PO TABS: 20.0000 mg | ORAL_TABLET | Freq: Two times a day (BID) | ORAL | Status: DC

## 2014-10-27 NOTE — Patient Instructions (Signed)

## 2014-10-27 NOTE — Assessment & Plan Note (Signed)
stable overall by history and exam, recent data reviewed with pt, and pt to continue medical treatment as before,  to f/u any worsening symptoms or concerns Lab Results  Component Value Date   HGBA1C 12.8* 04/21/2014   For f/u a1c, has gained wt recently  - for better DM diet

## 2014-10-27 NOTE — Progress Notes (Signed)
Pre visit review using our clinic review tool, if applicable. No additional management support is needed unless otherwise documented below in the visit note. 

## 2014-10-27 NOTE — Progress Notes (Signed)
Subjective:    Patient ID: Kurt Reid, male    DOB: 11-11-1950, 64 y.o.   MRN: [id number]  HPI  Here for wellness and f/u;  Overall doing ok;  Pt denies Chest pain, worsening SOB, DOE, wheezing, orthopnea, PND, worsening LE edema, palpitations, dizziness or syncope.  Pt denies neurological change such as new headache, facial or extremity weakness.  Pt denies polydipsia, polyuria, or low sugar symptoms. Pt states overall good compliance with treatment and medications, good tolerability, and has been trying to follow appropriate diet.  Pt denies worsening depressive symptoms, suicidal ideation or panic. No fever, night sweats, wt loss, loss of appetite, or other constitutional symptoms.  Pt states good ability with ADL's, has low fall risk, home safety reviewed and adequate, no other significant changes in hearing or vision, and only occasionally active with exercise.  Seen per Hep C clinic at [facility] - s/p tx, no evidence for active Hep C.  Has also been seein Dr Conception Chancy, with better cbg's. Needs propranolol refill Wt Readings from Last 3 Encounters:  10/27/14 225 lb (102.059 kg)  06/02/14 214 lb 1.9 oz (97.124 kg)  05/23/14 211 lb (95.709 kg)   Past Medical History  Diagnosis Date  . Depression   . Bipolar 1 disorder   . ADHD (attention deficit hyperactivity disorder)   . GERD (gastroesophageal reflux disease)   . Hepatitis C   . Hyperlipidemia   . Low back pain     chronic  . Allergic rhinitis   . Seizure disorder     H/O  . Benign prostatic hypertrophy   . Migraine   . ETOH abuse     hx  . Nephrolithiasis     hx  . Sinusitis     recurrent  . Diverticulosis of colon   . Anxiety   . ADHD 09/23/2006  . ALLERGIC RHINITIS 09/23/2006  . ANXIETY 04/19/2009  . BENIGN PROSTATIC HYPERTROPHY 11/02/2007  . BIPOLAR AFFECTIVE DISORDER 09/23/2006  . Chronic pain syndrome 01/18/2009  . DEPRESSION 09/23/2006  . DIVERTICULOSIS, COLON 04/19/2009  . ESOPHAGEAL VARICES 04/19/2009  . GERD  09/23/2006  . HEPATITIS C 09/23/2006  . HYPERLIPIDEMIA 09/23/2006  . INSOMNIA-SLEEP DISORDER-UNSPEC 04/19/2009  . LOW BACK PAIN 09/23/2006  . NEPHROLITHIASIS, HX OF 11/02/2007  . SEIZURE DISORDER 09/23/2006  . 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 Diabetes mellitus without complication   . MRSA (methicillin resistant staph aureus) culture positive    Past Surgical History  Procedure Laterality Date  . Back surgury      x 5  . Knee surgury      x 1  . Cheekbone      hx of fx  . Tonsillectomy    . S/p sinus surgury  2010    Dr. Phebe Colla  . I&d extremity Right 12/10/2013    Procedure: IRRIGATION AND DEBRIDEMENT OF MULTIPLE DOG BITES RIGHT HAND AND WRIST;  Surgeon: Roseanne Kaufman, MD;  Location: [place];  Service: Orthopedics;  Laterality: Right;    reports that he has quit smoking. He has never used smokeless tobacco. He reports that he does not drink alcohol or use illicit drugs. family history includes Asthma in his daughter; Dementia in his father; Depression in his mother; Hyperlipidemia in his mother; Hypertension in his mother; Stroke in his mother. There is no history of Diabetes. Allergies  Allergen Reactions  . Duloxetine     REACTION: memory dysfunction, dizziness  . Latex Other (See Comments)  Reaction unknown  . Levofloxacin Nausea Only  . Mercury Other (See Comments)    Reaction unknown  . Sulfonamide Derivatives Other (See Comments)    Reaction unknown   Current Outpatient Prescriptions on File Prior to Visit  Medication Sig Dispense Refill  . aspirin (ASPIRIN EC) 81 MG EC tablet Take 81 mg by mouth daily.      . B-D UF III MINI PEN NEEDLES 31G X 5 MM MISC USE AS DIRECTED ONCE DAILY 100 each 11  . fluticasone (FLONASE) 50 MCG/ACT nasal spray Place 2 sprays into both nostrils daily. 16 g 5  . gabapentin (NEURONTIN) 300 MG capsule Take 2 capsules (600 mg total) by mouth 2 (two) times daily. 180 capsule 5  . glucose blood (ONE TOUCH ULTRA TEST) test  strip USE AS DIRECTED three times daily 250.02 300 each 11  . HYDROcodone-acetaminophen (NORCO) 10-325 MG per tablet Take 1 tablet by mouth 2 (two) times daily as needed for severe pain.     . lactulose (CHRONULAC) 10 GM/15ML solution Take 10 g by mouth daily as needed for mild constipation or moderate constipation.    . Lancets Misc. (AUTOLET II CLINISAFE) KIT 1 Device by Does not apply route once. Any brand ok 1 each 0  . Lancets Misc. (ONE TOUCH SURESOFT) MISC Use as directed once per day 100 each 11  . Lancets MISC Use as directed three times per day 250.02 300 each 11  . omeprazole (PRILOSEC) 20 MG capsule TAKE ONE CAPSULE BY MOUTH EVERY DAY 90 capsule 2  . spironolactone (ALDACTONE) 50 MG tablet Take 50 mg by mouth daily.    . Venlafaxine HCl 225 MG TB24 1 tab  By mouth daily 90 each 3  . glipiZIDE (GLUCOTROL XL) 10 MG 24 hr tablet Take 10 mg by mouth 2 (two) times daily.    . glipiZIDE (GLUCOTROL XL) 10 MG 24 hr tablet TAKE 1 TABLET BY MOUTH TWICE DAILY (Patient not taking: Reported on 10/27/2014) 180 tablet 3  . Insulin Glargine (TOUJEO SOLOSTAR) 300 UNIT/ML SOPN Inject 80 Units into the skin every morning. (Patient not taking: Reported on 10/27/2014) 3 mL 11  . metFORMIN (GLUMETZA) 500 MG (MOD) 24 hr tablet Take 2,000 mg by mouth daily with breakfast.    . nortriptyline (PAMELOR) 10 MG capsule Take 10 mg by mouth daily as needed.      No current facility-administered medications on file prior to visit.    Review of Systems Constitutional: Negative for increased diaphoresis, other activity, appetite or siginficant weight change other than noted HENT: Negative for worsening hearing loss, ear pain, facial swelling, mouth sores and neck stiffness.   Eyes: Negative for other worsening pain, redness or visual disturbance.  Respiratory: Negative for shortness of breath and wheezing  Cardiovascular: Negative for chest pain and palpitations.  Gastrointestinal: Negative for diarrhea, blood in  stool, abdominal distention or other pain Genitourinary: Negative for hematuria, flank pain or change in urine volume.  Musculoskeletal: Negative for myalgias or other joint complaints.  Skin: Negative for color change and wound or drainage.  Neurological: Negative for syncope and numbness. other than noted Hematological: Negative for adenopathy. or other swelling Psychiatric/Behavioral: Negative for hallucinations, SI, self-injury, decreased concentration or other worsening agitation.      Objective:   Physical Exam BP 120/80 mmHg  Pulse 76  Temp(Src) 98.7 F (37.1 C) (Oral)  Ht 6' 1" (1.854 m)  Wt 225 lb (102.059 kg)  BMI 29.69 kg/m2  SpO2 96% VS noted,    Constitutional: Pt is oriented to person, place, and time. Appears well-developed and well-nourished, in no significant distress Head: Normocephalic and atraumatic.  Right Ear: External ear normal.  Left Ear: External ear normal.  Nose: Nose normal.  Mouth/Throat: Oropharynx is clear and moist.  Eyes: Conjunctivae and EOM are normal. Pupils are equal, round, and reactive to light.  Neck: Normal range of motion. Neck supple. No JVD present. No tracheal deviation present or significant neck LA or mass Cardiovascular: Normal rate, regular rhythm, normal heart sounds and intact distal pulses.   Pulmonary/Chest: Effort normal and breath sounds without rales or wheezing  Abdominal: Soft. Bowel sounds are normal. NT. No HSM  Musculoskeletal: Normal range of motion. Exhibits no edema.  Lymphadenopathy:  Has no cervical adenopathy.  Neurological: Pt is alert and oriented to person, place, and time. Pt has normal reflexes. No cranial nerve deficit. Motor grossly intact Skin: Skin is warm and dry. No rash noted.  Psychiatric:  Has mild nervous mood and affect. Behavior is normal.     Assessment & Plan:

## 2014-10-27 NOTE — Assessment & Plan Note (Signed)

## 2014-11-02 ENCOUNTER — Other Ambulatory Visit: Payer: Self-pay | Admitting: Internal Medicine

## 2014-11-14 ENCOUNTER — Telehealth: Payer: Self-pay | Admitting: Internal Medicine

## 2014-11-14 NOTE — Telephone Encounter (Signed)
Pt request refill for propranolol (INDERAL) 20 MG tablet to be send to [facility] on [address] in [place]. Please help

## 2014-11-15 MED ORDER — PROPRANOLOL HCL 20 MG PO TABS: 20.0000 mg | ORAL_TABLET | Freq: Two times a day (BID) | ORAL | Status: DC

## 2015-02-11 ENCOUNTER — Other Ambulatory Visit: Payer: Self-pay | Admitting: Internal Medicine

## 2015-03-31 ENCOUNTER — Other Ambulatory Visit: Payer: Self-pay | Admitting: Internal Medicine

## 2015-04-05 ENCOUNTER — Telehealth: Payer: Self-pay | Admitting: *Deleted

## 2015-04-05 NOTE — Telephone Encounter (Signed)
Received call from pt wife she states insurance is not wanting to cover the Venlafaxine 225 mg ext release even if md does a PA it will cost over $200, and they can not afford. She stated that they told her if md would maybe rx the  for him to take 1 1/2 in the morning and 1 in the afternoon. It will be covered. She know its not extended release but willing to try...Raechel Chute

## 2015-04-06 MED ORDER — VENLAFAXINE HCL 75 MG PO TABS
75.0000 mg | ORAL_TABLET | Freq: Three times a day (TID) | ORAL | Status: DC
Start: 2015-04-06 — End: 2016-04-15

## 2015-04-06 NOTE — Telephone Encounter (Signed)
Notified pt wife with md response.../lmb 

## 2015-04-06 NOTE — Telephone Encounter (Signed)
It would be better to spread the medication throughout the day  [facility] for venlaxine 75 tid (for total 225 mg) - done erx

## 2015-04-27 ENCOUNTER — Encounter: Payer: Self-pay | Admitting: Internal Medicine

## 2015-04-27 ENCOUNTER — Ambulatory Visit (INDEPENDENT_AMBULATORY_CARE_PROVIDER_SITE_OTHER): Payer: Medicare Other | Admitting: Internal Medicine

## 2015-04-27 ENCOUNTER — Other Ambulatory Visit (INDEPENDENT_AMBULATORY_CARE_PROVIDER_SITE_OTHER): Payer: Medicare Other

## 2015-04-27 VITALS — BP 132/78 | HR 79 | Temp 98.8°F | Resp 20 | Wt 228.0 lb

## 2015-04-27 DIAGNOSIS — Z299 Encounter for prophylactic measures, unspecified: Secondary | ICD-10-CM

## 2015-04-27 DIAGNOSIS — Z418 Encounter for other procedures for purposes other than remedying health state: Secondary | ICD-10-CM

## 2015-04-27 DIAGNOSIS — Z0000 Encounter for general adult medical examination without abnormal findings: Secondary | ICD-10-CM

## 2015-04-27 DIAGNOSIS — F329 Major depressive disorder, single episode, unspecified: Secondary | ICD-10-CM | POA: Diagnosis not present

## 2015-04-27 DIAGNOSIS — E119 Type 2 diabetes mellitus without complications: Secondary | ICD-10-CM | POA: Diagnosis not present

## 2015-04-27 DIAGNOSIS — E785 Hyperlipidemia, unspecified: Secondary | ICD-10-CM

## 2015-04-27 DIAGNOSIS — F411 Generalized anxiety disorder: Secondary | ICD-10-CM

## 2015-04-27 DIAGNOSIS — F32A Depression, unspecified: Secondary | ICD-10-CM

## 2015-04-27 LAB — HEPATIC FUNCTION PANEL
ALK PHOS: 160 U/L — AB (ref 39–117)
ALT: 20 U/L (ref 0–53)
AST: 32 U/L (ref 0–37)
Albumin: 3.3 g/dL — ABNORMAL LOW (ref 3.5–5.2)
Bilirubin, Direct: 0.3 mg/dL (ref 0.0–0.3)
TOTAL PROTEIN: 7.5 g/dL (ref 6.0–8.3)
Total Bilirubin: 1.2 mg/dL (ref 0.2–1.2)

## 2015-04-27 LAB — BASIC METABOLIC PANEL
BUN: 12 mg/dL (ref 6–23)
CALCIUM: 8.9 mg/dL (ref 8.4–10.5)
CO2: 30 meq/L (ref 19–32)
Chloride: 103 mEq/L (ref 96–112)
Creatinine, Ser: 0.79 mg/dL (ref 0.40–1.50)
GFR: 104.56 mL/min (ref >60.00)
GLUCOSE: 83 mg/dL (ref 70–99)
POTASSIUM: 3.9 meq/L (ref 3.5–5.1)
Sodium: 137 mEq/L (ref 135–145)

## 2015-04-27 LAB — HEMOGLOBIN A1C: HEMOGLOBIN A1C: 10.8 % — AB (ref 4.6–6.5)

## 2015-04-27 LAB — CBC WITH DIFFERENTIAL/PLATELET
BASOS ABS: 0.1 10*3/uL (ref 0.0–0.1)
Basophils Relative: 0.7 % (ref 0.0–3.0)
EOS ABS: 0.9 10*3/uL — AB (ref 0.0–0.7)
Eosinophils Relative: 5.7 % — ABNORMAL HIGH (ref 0.0–5.0)
HCT: 41.6 % (ref 39.0–52.0)
Hemoglobin: 14 g/dL (ref 13.0–17.0)
LYMPHS ABS: 3.8 10*3/uL (ref 0.7–4.0)
Lymphocytes Relative: 25.6 % (ref 12.0–46.0)
MCHC: 33.7 g/dL (ref 30.0–36.0)
MCV: 89.1 fl (ref 78.0–100.0)
MONO ABS: 1.4 10*3/uL — AB (ref 0.1–1.0)
Monocytes Relative: 9.5 % (ref 3.0–12.0)
NEUTROS PCT: 58.5 % (ref 43.0–77.0)
Neutro Abs: 8.8 10*3/uL — ABNORMAL HIGH (ref 1.4–7.7)
Platelets: 169 10*3/uL (ref 150.0–400.0)
RBC: 4.67 Mil/uL (ref 4.22–5.81)
RDW: 13.8 % (ref 11.5–15.5)
WBC: 15 10*3/uL — AB (ref 4.0–10.5)

## 2015-04-27 LAB — LIPID PANEL
CHOL/HDL RATIO: 3
CHOLESTEROL: 174 mg/dL (ref 0–200)
HDL: 54.7 mg/dL (ref >39.00)
LDL Cholesterol: 101 mg/dL — ABNORMAL HIGH (ref 0–99)
NonHDL: 119.18
TRIGLYCERIDES: 90 mg/dL (ref 0.0–149.0)
VLDL: 18 mg/dL (ref 0.0–40.0)

## 2015-04-27 LAB — TSH: TSH: 2.03 u[IU]/mL (ref 0.35–4.50)

## 2015-04-27 MED ORDER — GLUCOSE BLOOD VI STRP: ORAL_STRIP | Status: AC

## 2015-04-27 NOTE — Assessment & Plan Note (Signed)
Also for a1c today with labs, follows closely with endo

## 2015-04-27 NOTE — Assessment & Plan Note (Signed)
stable overall by history and exam, recent data reviewed with pt, and pt to continue medical treatment as before,  to f/u any worsening symptoms or concerns Lab Results  Component Value Date   LDLCALC 78 10/27/2014   For f/u lab

## 2015-04-27 NOTE — Assessment & Plan Note (Signed)
stable overall by history and exam, recent data reviewed with pt, and pt to continue medical treatment as before,  to f/u any worsening symptoms or concerns Lab Results  Component Value Date   WBC 12.0* 10/27/2014   HGB 14.6 10/27/2014   HCT 43.0 10/27/2014   PLT 128.0* 10/27/2014   GLUCOSE 135* 10/27/2014   CHOL 143 10/27/2014   TRIG 91.0 10/27/2014   HDL 47.20 10/27/2014   LDLDIRECT 81.5 11/02/2007   LDLCALC 78 10/27/2014   ALT 19 10/27/2014   AST 28 10/27/2014   NA 140 10/27/2014   K 4.1 10/27/2014   CL 106 10/27/2014   CREATININE 0.88 10/27/2014   BUN 10 10/27/2014   CO2 29 10/27/2014   TSH 2.27 10/27/2014   PSA 0.05* 10/27/2014   HGBA1C 8.3* 10/27/2014   MICROALBUR 0.7 10/27/2014

## 2015-04-27 NOTE — Progress Notes (Signed)
Pre visit review using our clinic review tool, if applicable. No additional management support is needed unless otherwise documented below in the visit note. 

## 2015-04-27 NOTE — Patient Instructions (Addendum)

## 2015-04-27 NOTE — Progress Notes (Signed)
Subjective:    Patient ID: Kurt Reid, male    DOB: 12-18-1950, 65 y.o.   MRN: [id number]  HPI    Here for wellness and f/u;  Overall doing ok;  Pt denies Chest pain, worsening SOB, DOE, wheezing, orthopnea, PND, worsening LE edema, palpitations, dizziness or syncope.  Pt denies neurological change such as new headache, facial or extremity weakness.  Pt denies polydipsia, polyuria, or low sugar symptoms. Pt states overall good compliance with treatment and medications, good tolerability, and has been trying to follow appropriate diet.  Pt denies worsening depressive symptoms, suicidal ideation or panic. No fever, night sweats, wt loss, loss of appetite, or other constitutional symptoms.  Pt states good ability with ADL's, has low fall risk, home safety reviewed and adequate, no other significant changes in hearing or vision, and only occasionally active with exercise. Wt has increased as has been forced to be less active, more lately due to right hip pain. Wt Readings from Last 3 Encounters:  04/27/15 228 lb (103.42 kg)  10/27/14 225 lb (102.059 kg)  06/02/14 214 lb 1.9 oz (97.124 kg)  S/p fall in nov 2016 to right hip, with an incomplete fx right femur/hip and followed closely with films freq per Dr Darrelyn Hillock. Due for flu shot.  Taking the venlefaxine asd, working ok Denies worsening depressive symptoms, suicidal ideation, or panic  Follows with endo closely with improved BS control Past Medical History  Diagnosis Date  . Depression   . Bipolar 1 disorder (HCC)   . ADHD (attention deficit hyperactivity disorder)   . GERD (gastroesophageal reflux disease)   . Hepatitis C   . Hyperlipidemia   . Low back pain     chronic  . Allergic rhinitis   . Seizure disorder (HCC)     H/O  . Benign prostatic hypertrophy   . Migraine   . ETOH abuse     hx  . Nephrolithiasis     hx  . Sinusitis     recurrent  . Diverticulosis of colon   . Anxiety   . ADHD 09/23/2006  . ALLERGIC RHINITIS  09/23/2006  . ANXIETY 04/19/2009  . BENIGN PROSTATIC HYPERTROPHY 11/02/2007  . BIPOLAR AFFECTIVE DISORDER 09/23/2006  . Chronic pain syndrome 01/18/2009  . DEPRESSION 09/23/2006  . DIVERTICULOSIS, COLON 04/19/2009  . ESOPHAGEAL VARICES 04/19/2009  . GERD 09/23/2006  . HEPATITIS C 09/23/2006  . HYPERLIPIDEMIA 09/23/2006  . INSOMNIA-SLEEP DISORDER-UNSPEC 04/19/2009  . LOW BACK PAIN 09/23/2006  . NEPHROLITHIASIS, HX OF 11/02/2007  . SEIZURE DISORDER 09/23/2006  . 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 Diabetes mellitus without complication (HCC)   . MRSA (methicillin resistant staph aureus) culture positive    Past Surgical History  Procedure Laterality Date  . Back surgury      x 5  . Knee surgury      x 1  . Cheekbone      hx of fx  . Tonsillectomy    . S/p sinus surgury  2010    Dr. Zara Chess  . I&d extremity Right 12/10/2013    Procedure: IRRIGATION AND DEBRIDEMENT OF MULTIPLE DOG BITES RIGHT HAND AND WRIST;  Surgeon: Dominica Severin, MD;  Location: [facility];  Service: Orthopedics;  Laterality: Right;    reports that he has quit smoking. He has never used smokeless tobacco. He reports that he does not drink alcohol or use illicit drugs. family history includes Asthma in his daughter; Dementia in his father; Depression in  his mother; Hyperlipidemia in his mother; Hypertension in his mother; Stroke in his mother. There is no history of Diabetes. Allergies  Allergen Reactions  . Duloxetine     REACTION: memory dysfunction, dizziness  . Latex Other (See Comments)    Reaction unknown  . Levofloxacin Nausea Only  . Mercury Other (See Comments)    Reaction unknown  . Sulfonamide Derivatives Other (See Comments)    Reaction unknown   Current Outpatient Prescriptions on File Prior to Visit  Medication Sig Dispense Refill  . aspirin (ASPIRIN EC) 81 MG EC tablet Take 81 mg by mouth daily.      . B-D UF III MINI PEN NEEDLES 31G X 5 MM MISC USE AS DIRECTED ONCE DAILY 100 each 11    . fluticasone (FLONASE) 50 MCG/ACT nasal spray PLACE 2 SPRAYS INTO BOTH NOSTRILS DAILY. 16 g 3  . gabapentin (NEURONTIN) 300 MG capsule Take 2 capsules (600 mg total) by mouth 2 (two) times daily. 180 capsule 5  . HYDROcodone-acetaminophen (NORCO) 10-325 MG per tablet Take 1 tablet by mouth 2 (two) times daily as needed for severe pain.     Marland Kitchen insulin lispro (HUMALOG) 100 UNIT/ML injection Inject into the skin 3 (three) times daily before meals. 45-50 units at breakfast, 10-15 units at lunch, and 45-50 units at dinner    . lactulose (CHRONULAC) 10 GM/15ML solution Take 10 g by mouth daily as needed for mild constipation or moderate constipation.    . Lancets Misc. (ONE TOUCH SURESOFT) MISC Use as directed once per day 100 each 11  . metFORMIN (GLUMETZA) 500 MG (MOD) 24 hr tablet Take 2,000 mg by mouth daily with breakfast.    . nortriptyline (PAMELOR) 10 MG capsule Take 10 mg by mouth daily as needed.     Marland Kitchen omeprazole (PRILOSEC) 20 MG capsule TAKE ONE CAPSULE BY MOUTH EVERY DAY 90 capsule 2  . propranolol (INDERAL) 20 MG tablet Take 1 tablet (20 mg total) by mouth 2 (two) times daily. 180 tablet 1  . spironolactone (ALDACTONE) 50 MG tablet Take 50 mg by mouth daily.    Marland Kitchen venlafaxine (EFFEXOR) 75 MG tablet Take 1 tablet (75 mg total) by mouth 3 (three) times daily with meals. 270 tablet 3   No current facility-administered medications on file prior to visit.    Review of Systems Constitutional: Negative for increased diaphoresis, other activity, appetite or siginficant weight change other than noted HENT: Negative for worsening hearing loss, ear pain, facial swelling, mouth sores and neck stiffness.   Eyes: Negative for other worsening pain, redness or visual disturbance.  Respiratory: Negative for shortness of breath and wheezing  Cardiovascular: Negative for chest pain and palpitations.  Gastrointestinal: Negative for diarrhea, blood in stool, abdominal distention or other  pain Genitourinary: Negative for hematuria, flank pain or change in urine volume.  Musculoskeletal: Negative for myalgias or other joint complaints.  Skin: Negative for color change and wound or drainage.  Neurological: Negative for syncope and numbness. other than noted Hematological: Negative for adenopathy. or other swelling Psychiatric/Behavioral: Negative for hallucinations, SI, self-injury, decreased concentration or other worsening agitation.      Objective:   Physical Exam BP 132/78 mmHg  Pulse 79  Temp(Src) 98.8 F (37.1 C) (Oral)  Resp 20  Wt 228 lb (103.42 kg)  SpO2 96% VS noted, not ill appearing Constitutional: Pt is oriented to person, place, and time. Appears well-developed and well-nourished, in no significant distress Head: Normocephalic and atraumatic.  Right Ear: External ear  normal.  Left Ear: External ear normal.  Nose: Nose normal.  Mouth/Throat: Oropharynx is clear and moist.  Eyes: Conjunctivae and EOM are normal. Pupils are equal, round, and reactive to light.  Neck: Normal range of motion. Neck supple. No JVD present. No tracheal deviation present or significant neck LA or mass Cardiovascular: Normal rate, regular rhythm, normal heart sounds and intact distal pulses.   Pulmonary/Chest: Effort normal and breath sounds without rales or wheezing  Abdominal: Soft. Bowel sounds are normal. NT. No HSM  Musculoskeletal: Normal range of motion. Exhibits no edema.  Lymphadenopathy:  Has no cervical adenopathy.  Neurological: Pt is alert and oriented to person, place, and time. Pt has normal reflexes. No cranial nerve deficit. Motor grossly intact Skin: Skin is warm and dry. No rash noted.  Psychiatric:  Has normal mood and affect. Behavior is normal.     Assessment & Plan:

## 2015-04-27 NOTE — Assessment & Plan Note (Signed)

## 2015-04-27 NOTE — Addendum Note (Signed)
Addended by: Etheleen Mayhew C on: 04/27/2015 10:59 AM   Modules accepted: Orders

## 2015-04-27 NOTE — Assessment & Plan Note (Signed)
stable overall by history and exam,, and pt to continue medical treatment as before,  to f/u any worsening symptoms or concerns, no recent panic symptoms,

## 2015-05-10 ENCOUNTER — Encounter: Payer: Self-pay | Admitting: Internal Medicine

## 2015-05-10 ENCOUNTER — Telehealth: Payer: Self-pay

## 2015-05-10 NOTE — Telephone Encounter (Signed)
Patients wife stated that they are still seeing Dr. Talmage NapBalan and have an appointment with him next month.

## 2015-05-16 ENCOUNTER — Telehealth: Payer: Self-pay | Admitting: Internal Medicine

## 2015-05-16 NOTE — Telephone Encounter (Signed)
Requesting last BUN and creatinine if done in last 30 days.  Fax number is [phone]

## 2015-05-16 NOTE — Telephone Encounter (Signed)
Faxed lab that was done on 2/213/17...Raechel Chute/lmb

## 2015-05-22 ENCOUNTER — Other Ambulatory Visit: Payer: Self-pay | Admitting: *Deleted

## 2015-05-22 MED ORDER — PROPRANOLOL HCL 20 MG PO TABS: 20.0000 mg | ORAL_TABLET | Freq: Two times a day (BID) | ORAL | Status: DC

## 2015-05-22 NOTE — Telephone Encounter (Signed)
Wife left msg on triage pt needing refill on his propanolol. Called wife back no answer LMOM rx sent to [facility]...Redgie Grayer/lbm

## 2015-08-04 ENCOUNTER — Other Ambulatory Visit: Payer: Self-pay | Admitting: Internal Medicine

## 2015-08-11 ENCOUNTER — Other Ambulatory Visit: Payer: Self-pay | Admitting: Internal Medicine

## 2015-09-18 ENCOUNTER — Other Ambulatory Visit: Payer: Self-pay | Admitting: Internal Medicine

## 2015-10-25 ENCOUNTER — Encounter: Payer: Medicare Other | Admitting: Internal Medicine

## 2015-11-17 ENCOUNTER — Encounter: Payer: Self-pay | Admitting: Internal Medicine

## 2015-11-17 ENCOUNTER — Ambulatory Visit (INDEPENDENT_AMBULATORY_CARE_PROVIDER_SITE_OTHER): Payer: Medicare Other | Admitting: Internal Medicine

## 2015-11-17 ENCOUNTER — Other Ambulatory Visit (INDEPENDENT_AMBULATORY_CARE_PROVIDER_SITE_OTHER): Payer: Medicare Other

## 2015-11-17 VITALS — BP 118/73 | HR 73 | Temp 98.7°F | Wt 226.4 lb

## 2015-11-17 DIAGNOSIS — E785 Hyperlipidemia, unspecified: Secondary | ICD-10-CM | POA: Diagnosis not present

## 2015-11-17 DIAGNOSIS — K7689 Other specified diseases of liver: Secondary | ICD-10-CM

## 2015-11-17 DIAGNOSIS — Z23 Encounter for immunization: Secondary | ICD-10-CM | POA: Diagnosis not present

## 2015-11-17 DIAGNOSIS — E119 Type 2 diabetes mellitus without complications: Secondary | ICD-10-CM

## 2015-11-17 DIAGNOSIS — J3089 Other allergic rhinitis: Secondary | ICD-10-CM

## 2015-11-17 LAB — LIPID PANEL
CHOLESTEROL: 151 mg/dL (ref 0–200)
HDL: 42.5 mg/dL (ref >39.00)
LDL Cholesterol: 89 mg/dL (ref 0–99)
NonHDL: 108.42
TRIGLYCERIDES: 96 mg/dL (ref 0.0–149.0)
Total CHOL/HDL Ratio: 4
VLDL: 19.2 mg/dL (ref 0.0–40.0)

## 2015-11-17 LAB — CBC WITH DIFFERENTIAL/PLATELET
Basophils Absolute: 0.1 10*3/uL (ref 0.0–0.1)
Basophils Relative: 0.6 % (ref 0.0–3.0)
EOS PCT: 6.2 % — AB (ref 0.0–5.0)
Eosinophils Absolute: 0.7 10*3/uL (ref 0.0–0.7)
HEMATOCRIT: 44 % (ref 39.0–52.0)
HEMOGLOBIN: 15.1 g/dL (ref 13.0–17.0)
Lymphocytes Relative: 19.9 % (ref 12.0–46.0)
Lymphs Abs: 2.2 10*3/uL (ref 0.7–4.0)
MCHC: 34.2 g/dL (ref 30.0–36.0)
MCV: 90 fl (ref 78.0–100.0)
MONOS PCT: 7.2 % (ref 3.0–12.0)
Monocytes Absolute: 0.8 10*3/uL (ref 0.1–1.0)
Neutro Abs: 7.3 10*3/uL (ref 1.4–7.7)
Neutrophils Relative %: 66.1 % (ref 43.0–77.0)
Platelets: 149 10*3/uL — ABNORMAL LOW (ref 150.0–400.0)
RBC: 4.88 Mil/uL (ref 4.22–5.81)
RDW: 14.7 % (ref 11.5–15.5)
WBC: 11 10*3/uL — AB (ref 4.0–10.5)

## 2015-11-17 LAB — HEPATIC FUNCTION PANEL
ALBUMIN: 3.3 g/dL — AB (ref 3.5–5.2)
ALK PHOS: 135 U/L — AB (ref 39–117)
ALT: 16 U/L (ref 0–53)
AST: 26 U/L (ref 0–37)
BILIRUBIN TOTAL: 2.2 mg/dL — AB (ref 0.2–1.2)
Bilirubin, Direct: 0.6 mg/dL — ABNORMAL HIGH (ref 0.0–0.3)
Total Protein: 7.4 g/dL (ref 6.0–8.3)

## 2015-11-17 LAB — BASIC METABOLIC PANEL
BUN: 13 mg/dL (ref 6–23)
CALCIUM: 8.8 mg/dL (ref 8.4–10.5)
CHLORIDE: 102 meq/L (ref 96–112)
CO2: 29 mEq/L (ref 19–32)
Creatinine, Ser: 0.97 mg/dL (ref 0.40–1.50)
GFR: 82.37 mL/min (ref >60.00)
GLUCOSE: 193 mg/dL — AB (ref 70–99)
POTASSIUM: 4.4 meq/L (ref 3.5–5.1)
SODIUM: 136 meq/L (ref 135–145)

## 2015-11-17 LAB — HEMOGLOBIN A1C: Hgb A1c MFr Bld: 8.9 % — ABNORMAL HIGH (ref 4.6–6.5)

## 2015-11-17 LAB — PROTIME-INR
INR: 1.3 ratio — ABNORMAL HIGH (ref 0.8–1.0)
PROTHROMBIN TIME: 13.4 s — AB (ref 9.6–13.1)

## 2015-11-17 NOTE — Patient Instructions (Addendum)

## 2015-11-17 NOTE — Progress Notes (Signed)
Subjective:    Patient ID: Kurt Reid, male    DOB: 01-May-1950, 65 y.o.   MRN: [id number]  HPI  Here to f/u; overall doing ok,  Pt denies chest pain, increasing sob or doe, wheezing, orthopnea, PND, increased LE swelling, palpitations, dizziness or syncope.  Pt denies new neurological symptoms such as new headache, or facial or extremity weakness or numbness.  Pt denies polydipsia, polyuria, or low sugar episode.   Pt denies new neurological symptoms such as new headache, or facial or extremity weakness or numbness.   Pt states overall good compliance with meds, mostly trying to follow appropriate diet, with wt overall stable,  but little exercise however. Last MRI liver showed a small nodule, needs labs and MRI f/u   Does have several wks ongoing nasal allergy symptoms with clearish congestion, itch and sneezing, without fever, pain, ST, cough, swelling or wheezing. Past Medical History:  Diagnosis Date  . ADHD 09/23/2006  . ADHD (attention deficit hyperactivity disorder)   . Allergic rhinitis   . ALLERGIC RHINITIS 09/23/2006  . Anxiety   . ANXIETY 04/19/2009  . Benign prostatic hypertrophy   . BENIGN PROSTATIC HYPERTROPHY 11/02/2007  . Bipolar 1 disorder (HCC)   . BIPOLAR AFFECTIVE DISORDER 09/23/2006  . Chronic pain syndrome 01/18/2009  . 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 Depression   . DEPRESSION 09/23/2006  . Diabetes mellitus without complication (HCC)   . Diverticulosis of colon   . DIVERTICULOSIS, COLON 04/19/2009  . ESOPHAGEAL VARICES 04/19/2009  . ETOH abuse    hx  . GERD 09/23/2006  . GERD (gastroesophageal reflux disease)   . Hepatitis C   . HEPATITIS C 09/23/2006  . Hyperlipidemia   . HYPERLIPIDEMIA 09/23/2006  . INSOMNIA-SLEEP DISORDER-UNSPEC 04/19/2009  . Low back pain    chronic  . LOW BACK PAIN 09/23/2006  . Migraine   . MRSA (methicillin resistant staph aureus) culture positive   . Nephrolithiasis    hx  . NEPHROLITHIASIS, HX OF 11/02/2007  .  SEIZURE DISORDER 09/23/2006  . Seizure disorder (HCC)    H/O  . Sinusitis    recurrent   Past Surgical History:  Procedure Laterality Date  . back surgury     x 5  . cheekbone     hx of fx  . I&D EXTREMITY Right 12/10/2013   Procedure: IRRIGATION AND DEBRIDEMENT OF MULTIPLE DOG BITES RIGHT HAND AND WRIST;  Surgeon: Dominica Severin, MD;  Location: [facility] OR;  Service: Orthopedics;  Laterality: Right;  . knee surgury     x 1  . s/p sinus surgury  2010   Dr. Zara Chess  . TONSILLECTOMY      reports that he has quit smoking. He has never used smokeless tobacco. He reports that he does not drink alcohol or use drugs. family history includes Asthma in his daughter; Dementia in his father; Depression in his mother; Hyperlipidemia in his mother; Hypertension in his mother; Stroke in his mother. Allergies  Allergen Reactions  . Duloxetine     REACTION: memory dysfunction, dizziness  . Latex Other (See Comments)    Reaction unknown  . Levofloxacin Nausea Only  . Mercury Other (See Comments)    Reaction unknown  . Sulfonamide Derivatives Other (See Comments)    Reaction unknown   Current Outpatient Prescriptions on File Prior to Visit  Medication Sig Dispense Refill  . aspirin (ASPIRIN EC) 81 MG EC tablet Take 81 mg by mouth daily.      Marland Kitchen  B-D UF III MINI PEN NEEDLES 31G X 5 MM MISC USE AS DIRECTED ONCE DAILY 100 each 11  . fluticasone (FLONASE) 50 MCG/ACT nasal spray PLACE 2 SPRAYS INTO BOTH NOSTRILS DAILY. 16 g 3  . gabapentin (NEURONTIN) 300 MG capsule TAKE 2 CAPSULES BY MOUTH 2 TIMES DAILY. 180 capsule 2  . glucose blood (ONE TOUCH ULTRA TEST) test strip USE AS DIRECTED THREE TIMES DAILY 300 each 1  . HYDROcodone-acetaminophen (NORCO) 10-325 MG per tablet Take 1 tablet by mouth 2 (two) times daily as needed for severe pain.     Marland Kitchen. insulin lispro (HUMALOG) 100 UNIT/ML injection Inject into the skin 3 (three) times daily before meals. 45-50 units at breakfast, 10-15 units at lunch, and  45-50 units at dinner    . lactulose (CHRONULAC) 10 GM/15ML solution Take 10 g by mouth daily as needed for mild constipation or moderate constipation.    . Lancets Misc. (ONE TOUCH SURESOFT) MISC Use as directed once per day 100 each 11  . omeprazole (PRILOSEC) 20 MG capsule Take 1 capsule (20 mg total) by mouth daily. 90 capsule 1  . propranolol (INDERAL) 20 MG tablet Take 1 tablet (20 mg total) by mouth 2 (two) times daily. 180 tablet 3  . spironolactone (ALDACTONE) 50 MG tablet Take 50 mg by mouth daily.    Marland Kitchen. venlafaxine (EFFEXOR) 75 MG tablet Take 1 tablet (75 mg total) by mouth 3 (three) times daily with meals. 270 tablet 3   No current facility-administered medications on file prior to visit.     Review of Systems  Constitutional: Negative for unusual diaphoresis or night sweats HENT: Negative for ear swelling or discharge Eyes: Negative for worsening visual haziness  Respiratory: Negative for choking and stridor.   Gastrointestinal: Negative for distension or worsening eructation Genitourinary: Negative for retention or change in urine volume.  Musculoskeletal: Negative for other MSK pain or swelling Skin: Negative for color change and worsening wound Neurological: Negative for tremors and numbness other than noted  Psychiatric/Behavioral: Negative for decreased concentration or agitation other than above       Objective:   Physical Exam BP 118/73 (BP Location: Left Arm, Cuff Size: Normal)   Pulse 73   Temp 98.7 F (37.1 C) (Oral)   Wt 226 lb 6.4 oz (102.7 kg)   SpO2 94%   BMI 29.87 kg/m  VS noted,  Constitutional: Pt appears in no apparent distress HENT: Head: NCAT.  Right Ear: External ear normal.  Left Ear: External ear normal.  Eyes: . Pupils are equal, round, and reactive to light. Conjunctivae and EOM are normal Bilat tm's with mild erythema.  Max sinus areas non tender.  Pharynx with mild erythema, no exudate Neck: Normal range of motion. Neck supple.    Cardiovascular: Normal rate and regular rhythm.   Pulmonary/Chest: Effort normal and breath sounds without rales or wheezing.  Abd:  Soft, NT, ND, + BS - benign exam Neurological: Pt is alert. Not confused , motor grossly intact Skin: Skin is warm. No rash, no LE edema Psychiatric: Pt behavior is normal. No agitation.     Assessment & Plan:

## 2015-11-17 NOTE — Assessment & Plan Note (Addendum)
Mild to mod, for otc nasacort asd, to f/u any worsening symptoms or concerns ?

## 2015-11-18 NOTE — Assessment & Plan Note (Signed)
stable overall by history and exam, recent data reviewed with pt, and pt to continue medical treatment as before,  to f/u any worsening symptoms or concerns Lab Results  Component Value Date   LDLCALC 89 11/17/2015

## 2015-11-18 NOTE — Assessment & Plan Note (Signed)
stable overall by history and exam, recent data reviewed with pt, and pt to continue medical treatment as before,  to f/u any worsening symptoms or concerns Lab Results  Component Value Date   HGBA1C 8.9 (H) 11/17/2015

## 2015-11-18 NOTE — Assessment & Plan Note (Signed)
For labs as requested per Dr Meisinhemier/GI - Kateri Mcuke

## 2015-11-21 ENCOUNTER — Encounter: Payer: Self-pay | Admitting: Internal Medicine

## 2015-11-27 ENCOUNTER — Telehealth: Payer: Self-pay

## 2015-11-27 NOTE — Telephone Encounter (Signed)
Dr. Jennye BoroughsMisenheimer office and wanted to know if we have done this test "AFP" on the patient. They did not want to order it again if it has already been done. They did not see it on the lab results they got from us. Please follow up with them if they need to order it.  [phone] Diannia Ruder- Kara EXT: [phone]  Fax: [phone] If it was done just fax it over. Thank you.

## 2015-11-28 NOTE — Telephone Encounter (Signed)
Spoke to Kara

## 2015-11-28 NOTE — Telephone Encounter (Signed)
Very sorry, I believe it was not drawn.  There are no results pending

## 2015-11-28 NOTE — Telephone Encounter (Signed)
Called to speak to Kurt Reid, left message for her to give us a call back regarding patient labs.

## 2016-01-30 ENCOUNTER — Other Ambulatory Visit: Payer: Self-pay | Admitting: Internal Medicine

## 2016-02-05 ENCOUNTER — Other Ambulatory Visit: Payer: Self-pay | Admitting: Internal Medicine

## 2016-03-18 ENCOUNTER — Other Ambulatory Visit: Payer: Self-pay | Admitting: Internal Medicine

## 2016-04-15 ENCOUNTER — Other Ambulatory Visit: Payer: Self-pay | Admitting: Internal Medicine

## 2016-05-08 ENCOUNTER — Other Ambulatory Visit: Payer: Self-pay | Admitting: Internal Medicine

## 2016-05-14 ENCOUNTER — Other Ambulatory Visit: Payer: Self-pay | Admitting: Internal Medicine

## 2016-05-16 ENCOUNTER — Ambulatory Visit: Payer: Medicare Other | Admitting: Internal Medicine

## 2016-05-22 ENCOUNTER — Other Ambulatory Visit (INDEPENDENT_AMBULATORY_CARE_PROVIDER_SITE_OTHER): Payer: Medicare Other

## 2016-05-22 ENCOUNTER — Ambulatory Visit (INDEPENDENT_AMBULATORY_CARE_PROVIDER_SITE_OTHER): Payer: Medicare Other | Admitting: Internal Medicine

## 2016-05-22 ENCOUNTER — Encounter: Payer: Self-pay | Admitting: Internal Medicine

## 2016-05-22 VITALS — BP 134/88 | HR 81 | Temp 98.8°F | Ht 72.0 in | Wt 235.0 lb

## 2016-05-22 DIAGNOSIS — Z0001 Encounter for general adult medical examination with abnormal findings: Secondary | ICD-10-CM | POA: Diagnosis not present

## 2016-05-22 DIAGNOSIS — L989 Disorder of the skin and subcutaneous tissue, unspecified: Secondary | ICD-10-CM | POA: Diagnosis not present

## 2016-05-22 DIAGNOSIS — J309 Allergic rhinitis, unspecified: Secondary | ICD-10-CM

## 2016-05-22 DIAGNOSIS — J019 Acute sinusitis, unspecified: Secondary | ICD-10-CM | POA: Diagnosis not present

## 2016-05-22 DIAGNOSIS — K7460 Unspecified cirrhosis of liver: Secondary | ICD-10-CM

## 2016-05-22 DIAGNOSIS — F329 Major depressive disorder, single episode, unspecified: Secondary | ICD-10-CM | POA: Diagnosis not present

## 2016-05-22 DIAGNOSIS — F32A Depression, unspecified: Secondary | ICD-10-CM

## 2016-05-22 DIAGNOSIS — B182 Chronic viral hepatitis C: Secondary | ICD-10-CM | POA: Diagnosis not present

## 2016-05-22 DIAGNOSIS — K7689 Other specified diseases of liver: Secondary | ICD-10-CM | POA: Diagnosis not present

## 2016-05-22 DIAGNOSIS — E119 Type 2 diabetes mellitus without complications: Secondary | ICD-10-CM

## 2016-05-22 LAB — CBC WITH DIFFERENTIAL/PLATELET
BASOS PCT: 1 % (ref 0.0–3.0)
Basophils Absolute: 0.1 10*3/uL (ref 0.0–0.1)
EOS PCT: 6 % — AB (ref 0.0–5.0)
Eosinophils Absolute: 0.5 10*3/uL (ref 0.0–0.7)
HCT: 39.7 % (ref 39.0–52.0)
HEMOGLOBIN: 13.8 g/dL (ref 13.0–17.0)
LYMPHS ABS: 1.5 10*3/uL (ref 0.7–4.0)
Lymphocytes Relative: 18.9 % (ref 12.0–46.0)
MCHC: 34.8 g/dL (ref 30.0–36.0)
MCV: 90.7 fl (ref 78.0–100.0)
MONO ABS: 0.6 10*3/uL (ref 0.1–1.0)
Monocytes Relative: 7.7 % (ref 3.0–12.0)
Neutro Abs: 5.3 10*3/uL (ref 1.4–7.7)
Neutrophils Relative %: 66.4 % (ref 43.0–77.0)
Platelets: 133 10*3/uL — ABNORMAL LOW (ref 150.0–400.0)
RBC: 4.38 Mil/uL (ref 4.22–5.81)
RDW: 14.5 % (ref 11.5–15.5)
WBC: 8 10*3/uL (ref 4.0–10.5)

## 2016-05-22 LAB — BASIC METABOLIC PANEL
BUN: 10 mg/dL (ref 6–23)
CO2: 28 mEq/L (ref 19–32)
Calcium: 8.8 mg/dL (ref 8.4–10.5)
Chloride: 106 mEq/L (ref 96–112)
Creatinine, Ser: 0.83 mg/dL (ref 0.40–1.50)
GFR: 98.44 mL/min (ref >60.00)
Glucose, Bld: 177 mg/dL — ABNORMAL HIGH (ref 70–99)
POTASSIUM: 4.1 meq/L (ref 3.5–5.1)
SODIUM: 139 meq/L (ref 135–145)

## 2016-05-22 LAB — TSH: TSH: 1.83 u[IU]/mL (ref 0.35–4.50)

## 2016-05-22 LAB — HEPATIC FUNCTION PANEL
ALK PHOS: 137 U/L — AB (ref 39–117)
ALT: 18 U/L (ref 0–53)
AST: 30 U/L (ref 0–37)
Albumin: 3.2 g/dL — ABNORMAL LOW (ref 3.5–5.2)
BILIRUBIN TOTAL: 1.7 mg/dL — AB (ref 0.2–1.2)
Bilirubin, Direct: 0.4 mg/dL — ABNORMAL HIGH (ref 0.0–0.3)
Total Protein: 6.7 g/dL (ref 6.0–8.3)

## 2016-05-22 LAB — PSA: PSA: 0.08 ng/mL — AB (ref 0.10–4.00)

## 2016-05-22 MED ORDER — TRIAMCINOLONE ACETONIDE 55 MCG/ACT NA AERO: 2.0000 | INHALATION_SPRAY | Freq: Every day | NASAL | 12 refills | Status: AC

## 2016-05-22 MED ORDER — CEPHALEXIN 500 MG PO CAPS: 500.0000 mg | ORAL_CAPSULE | Freq: Three times a day (TID) | ORAL | 0 refills | Status: AC

## 2016-05-22 MED ORDER — DESVENLAFAXINE SUCCINATE ER 50 MG PO TB24: 50.0000 mg | ORAL_TABLET | Freq: Every day | ORAL | 3 refills | Status: AC

## 2016-05-22 NOTE — Progress Notes (Signed)
Subjective:    Patient ID: Kurt Reid, male    DOB: 02-25-1951, 66 y.o.   MRN: [id number]  HPI  Here for wellness and f/u;  Overall doing ok;  Pt denies neurological change such as new headache, facial or extremity weakness.  Pt denies polydipsia, polyuria, or low sugar symptoms. Pt states overall good compliance with treatment and medications, good tolerability, and has been trying to follow appropriate diet.  No fever, night sweats, wt loss, loss of appetite, or other constitutional symptoms.  Pt states good ability with ADL's, has low fall risk, home safety reviewed and adequate, no other significant changes in hearing or vision, and only occasionally active with exercise  Has had wt gain since last visit, and maybe new onset worsening ascites? As abd seems more distended.  Denies worsening reflux, abd pain, dysphagia, n/v, bowel change or blood. .   Wt Readings from Last 3 Encounters:  05/22/16 235 lb (106.6 kg)  11/17/15 226 lb 6.4 oz (102.7 kg)  04/27/15 228 lb (103.4 kg)  Gets EGD for varices every year on average it seems per Dr Hayden Rasmussen, GI in [place].Marland Kitchen    Also with persistent nasal congestion, and flonase irritates, continues symotoms still despite the allegra  Pristiq now generic, pt assk for change of venlefaxine. Denies worsening depressive symptoms, suicidal ideation, or panic but feels pristiq worked well before, also has ongoing anxiety, not increased recently,   Also  Here with 2-3 days acute onset fever, facial pain, pressure, headache, general weakness and malaise, and greenish d/c, with mild ST and cough,  Past Medical History:  Diagnosis Date  . ADHD 09/23/2006  . ADHD (attention deficit hyperactivity disorder)   . Allergic rhinitis   . ALLERGIC RHINITIS 09/23/2006  . Anxiety   . ANXIETY 04/19/2009  . Benign prostatic hypertrophy   . BENIGN PROSTATIC HYPERTROPHY 11/02/2007  . Bipolar 1 disorder (HCC)   . BIPOLAR AFFECTIVE DISORDER 09/23/2006  . Chronic pain  syndrome 01/18/2009  . 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 Depression   . DEPRESSION 09/23/2006  . Diabetes mellitus without complication (HCC)   . Diverticulosis of colon   . DIVERTICULOSIS, COLON 04/19/2009  . ESOPHAGEAL VARICES 04/19/2009  . ETOH abuse    hx  . GERD 09/23/2006  . GERD (gastroesophageal reflux disease)   . Hepatitis C   . HEPATITIS C 09/23/2006  . Hyperlipidemia   . HYPERLIPIDEMIA 09/23/2006  . INSOMNIA-SLEEP DISORDER-UNSPEC 04/19/2009  . Low back pain    chronic  . LOW BACK PAIN 09/23/2006  . Migraine   . MRSA (methicillin resistant staph aureus) culture positive   . Nephrolithiasis    hx  . NEPHROLITHIASIS, HX OF 11/02/2007  . SEIZURE DISORDER 09/23/2006  . Seizure disorder (HCC)    H/O  . Sinusitis    recurrent   Past Surgical History:  Procedure Laterality Date  . back surgury     x 5  . cheekbone     hx of fx  . I&D EXTREMITY Right 12/10/2013   Procedure: IRRIGATION AND DEBRIDEMENT OF MULTIPLE DOG BITES RIGHT HAND AND WRIST;  Surgeon: Dominica Severin, MD;  Location: [facility] OR;  Service: Orthopedics;  Laterality: Right;  . knee surgury     x 1  . s/p sinus surgury  2010   Dr. Zara Chess  . TONSILLECTOMY      reports that he has quit smoking. He has never used smokeless tobacco. He reports that he does not drink  alcohol or use drugs. family history includes Asthma in his daughter; Dementia in his father; Depression in his mother; Hyperlipidemia in his mother; Hypertension in his mother; Stroke in his mother. Allergies  Allergen Reactions  . Duloxetine     REACTION: memory dysfunction, dizziness  . Latex Other (See Comments)    Reaction unknown  . Levofloxacin Nausea Only  . Mercury Other (See Comments)    Reaction unknown  . Sulfonamide Derivatives Other (See Comments)    Reaction unknown   Current Outpatient Prescriptions on File Prior to Visit  Medication Sig Dispense Refill  . aspirin (ASPIRIN EC) 81 MG EC tablet Take 81 mg by  mouth daily.      . B-D UF III MINI PEN NEEDLES 31G X 5 MM MISC USE AS DIRECTED ONCE DAILY 100 each 11  . fluticasone (FLONASE) 50 MCG/ACT nasal spray PLACE 2 SPRAYS INTO BOTH NOSTRILS DAILY. 16 g 3  . gabapentin (NEURONTIN) 300 MG capsule TAKE 2 CAPSULES BY MOUTH 2 TIMES DAILY 180 capsule 1  . glucose blood (ONE TOUCH ULTRA TEST) test strip USE AS DIRECTED THREE TIMES DAILY 300 each 1  . HYDROcodone-acetaminophen (NORCO) 10-325 MG per tablet Take 1 tablet by mouth 2 (two) times daily as needed for severe pain.     Marland Kitchen lactulose (CHRONULAC) 10 GM/15ML solution Take 10 g by mouth daily as needed for mild constipation or moderate constipation.    . Lancets Misc. (ONE TOUCH SURESOFT) MISC Use as directed once per day 100 each 11  . omeprazole (PRILOSEC) 20 MG capsule Take 1 capsule (20 mg total) by mouth daily. Overdue for yearly physical w/labs must see MD for refills 30 capsule 0  . propranolol (INDERAL) 20 MG tablet Take 1 tablet (20 mg total) by mouth 2 (two) times daily. Overdue for yearly physical w/labs must see MD for refills 60 tablet 0   No current facility-administered medications on file prior to visit.      Review of Systems Constitutional: Negative for increased diaphoresis, or other activity, appetite or siginficant weight change other than noted HENT: Negative for worsening hearing loss, ear pain, facial swelling, mouth sores and neck stiffness.   Eyes: Negative for other worsening pain, redness or visual disturbance.  Respiratory: Negative for choking or stridor Cardiovascular: Negative for other chest pain and palpitations.  Gastrointestinal: Negative for worsening diarrhea, blood in stool, or abdominal distention Genitourinary: Negative for hematuria, flank pain or change in urine volume.  Musculoskeletal: Negative for myalgias or other joint complaints.  Skin: Negative for other color change and wound or drainage. though has a flaky lesion to the back to look at  today Neurological: Negative for syncope and numbness. other than noted Hematological: Negative for adenopathy. or other swelling Psychiatric/Behavioral: Negative for hallucinations, SI, self-injury, decreased concentration or other worsening agitation.      Objective:   Physical Exam BP 134/88   Pulse 81   Temp 98.8 F (37.1 C) (Oral)   Ht 6' (1.829 m)   Wt 235 lb (106.6 kg)   SpO2 96%   BMI 31.87 kg/m  VS noted, mild ill appearing Constitutional: Pt is oriented to person, place, and time. Appears well-developed and well-nourished, in no significant distress Head: Normocephalic and atraumatic  Eyes: Conjunctivae and EOM are normal. Pupils are equal, round, and reactive to light Right Ear: External ear normal.  Left Ear: External ear normal Nose: Nose normal.  Bilat tm's with mild erythema.  Max sinus areas mild tender.  Pharynx with  mild erythema, no exudate Neck: Normal range of motion. Neck supple. No JVD present. No tracheal deviation present or significant neck LA or mass Cardiovascular: Normal rate, regular rhythm, normal heart sounds and intact distal pulses.   Pulmonary/Chest: Effort normal and breath sounds without rales or wheezing  Abdominal: Soft. Bowel sounds are normal. NT. No HSM except for mild palpable left costal margin tenderness without localized swelling skin change;  abd does seem mild distended/larger than previous, but I am unable to appreciate fluid wave Musculoskeletal: Normal range of motion. Exhibits no edema Lymphadenopathy: Has no cervical adenopathy.  Neurological: Pt is alert and oriented to person, place, and time. Pt has normal reflexes. No cranial nerve deficit. Motor grossly intact Skin: Skin is warm and dry. No rash noted or new ulcers  Has a small 10 mm raised tan keratotic lesion noted left upper back Psychiatric:  Has normal mood and affect. Behavior is normal. Mild nervous, not depressed affect No other exam findings    Assessment & Plan:

## 2016-05-22 NOTE — Assessment & Plan Note (Addendum)
Slightly raised, tan, left upper back, likely benign, would cont to follow for any changes

## 2016-05-22 NOTE — Progress Notes (Signed)
Pre visit review using our clinic review tool, if applicable. No additional management support is needed unless otherwise documented below in the visit note. 

## 2016-05-22 NOTE — Assessment & Plan Note (Signed)
Pt wife reports next surveillance MRI to be dona in about 2 mo per [place], Dr Meisinheimer

## 2016-05-22 NOTE — Patient Instructions (Addendum)
Please remember to make appt with Eye doctor in [place] as you prefer  Ok to stop the flonase, and start the nasacort AQ  OK to change the venlafaxine to Pristiq 50 mg per day  Please take all new medication as prescribed - the antibiotic  Please continue all other medications as before, and refills have been done if requested.  Please have the pharmacy call with any other refills you may need.  Please continue your efforts at being more active, low cholesterol diet, and weight control.  You are otherwise up to date with prevention measures today.  Please keep your appointments with your specialists as you may have planned  Please go to the LAB in the Basement (turn left off the elevator) for the tests to be done today  You will be contacted by phone if any changes need to be made immediately.  Otherwise, you will receive a letter about your results with an explanation, but please check with MyChart first.  Please remember to sign up for MyChart if you have not done so, as this will be important to you in the future with finding out test results, communicating by private email, and scheduling acute appointments online when needed.  Please return in 6 months, or sooner if needed

## 2016-05-23 ENCOUNTER — Other Ambulatory Visit (INDEPENDENT_AMBULATORY_CARE_PROVIDER_SITE_OTHER): Payer: Medicare Other

## 2016-05-23 ENCOUNTER — Encounter: Payer: Self-pay | Admitting: Internal Medicine

## 2016-05-23 DIAGNOSIS — E119 Type 2 diabetes mellitus without complications: Secondary | ICD-10-CM | POA: Diagnosis not present

## 2016-05-23 LAB — PROTIME-INR
INR: 1.2 ratio — AB (ref 0.8–1.0)
Prothrombin Time: 13.1 s (ref 9.6–13.1)

## 2016-05-23 LAB — HEMOGLOBIN A1C: HEMOGLOBIN A1C: 8.4 % — AB (ref 4.6–6.5)

## 2016-05-24 ENCOUNTER — Encounter: Payer: Self-pay | Admitting: Internal Medicine

## 2016-05-24 ENCOUNTER — Other Ambulatory Visit: Payer: Self-pay | Admitting: Internal Medicine

## 2016-05-24 MED ORDER — METFORMIN HCL ER 500 MG PO TB24: 1000.0000 mg | ORAL_TABLET | Freq: Every day | ORAL | 3 refills | Status: AC

## 2016-05-26 NOTE — Assessment & Plan Note (Signed)
Also for INR

## 2016-05-26 NOTE — Assessment & Plan Note (Addendum)
Mild to mod, for antibx course,  to f/u any worsening symptoms or concerns  Note:  Total time for pt hx, exam, review of record with pt in the room, determination of diagnoses and plan for further eval and tx is > 40 min, with over 50% spent in coordination and counseling of patient for sinus infection diagnosis and management, allergy treatment change with reduced risk of nosebleed, depression management, and diabetic management including diet and med compliance, activity level and sugar management

## 2016-05-26 NOTE — Assessment & Plan Note (Signed)

## 2016-05-26 NOTE — Assessment & Plan Note (Signed)
stable overall by history and exam, recent data reviewed with pt, and pt to continue medical treatment as before,  to f/u any worsening symptoms or concerns Lab Results  Component Value Date   HGBA1C 8.4 (H) 05/23/2016

## 2016-05-26 NOTE — Assessment & Plan Note (Signed)
Ok for change flonase to nasaocrt aq,  to f/u any worsening symptoms or concerns

## 2016-05-26 NOTE — Assessment & Plan Note (Signed)
Stable, but ok for change back to pristiq per pt request

## 2016-06-07 ENCOUNTER — Other Ambulatory Visit: Payer: Self-pay | Admitting: Internal Medicine

## 2016-06-11 ENCOUNTER — Other Ambulatory Visit: Payer: Self-pay | Admitting: Internal Medicine

## 2016-06-12 ENCOUNTER — Other Ambulatory Visit: Payer: Self-pay | Admitting: Internal Medicine

## 2016-06-18 ENCOUNTER — Other Ambulatory Visit: Payer: Self-pay | Admitting: Internal Medicine

## 2016-07-15 ENCOUNTER — Other Ambulatory Visit: Payer: Self-pay | Admitting: Internal Medicine

## 2016-07-23 ENCOUNTER — Encounter: Payer: Self-pay | Admitting: Nurse Practitioner

## 2016-07-23 ENCOUNTER — Ambulatory Visit (INDEPENDENT_AMBULATORY_CARE_PROVIDER_SITE_OTHER): Payer: Medicare Other | Admitting: Nurse Practitioner

## 2016-07-23 VITALS — BP 122/80 | HR 81 | Temp 98.8°F | Ht 72.0 in | Wt 227.0 lb

## 2016-07-23 DIAGNOSIS — L02439 Carbuncle of limb, unspecified: Secondary | ICD-10-CM | POA: Diagnosis not present

## 2016-07-23 DIAGNOSIS — Z22322 Carrier or suspected carrier of Methicillin resistant Staphylococcus aureus: Secondary | ICD-10-CM | POA: Diagnosis not present

## 2016-07-23 MED ORDER — CHLORHEXIDINE GLUCONATE 4 % EX LIQD: CUTANEOUS | 0 refills | Status: AC

## 2016-07-23 MED ORDER — DOXYCYCLINE HYCLATE 100 MG PO TABS: 100.0000 mg | ORAL_TABLET | Freq: Two times a day (BID) | ORAL | 0 refills | Status: AC

## 2016-07-23 NOTE — Patient Instructions (Addendum)
Skin Abscess A skin abscess is an infected area on or under your skin that contains a collection of pus and other material. An abscess may also be called a furuncle, carbuncle, or boil. An abscess can occur in or on almost any part of your body. Some abscesses break open (rupture) on their own. Most continue to get worse unless they are treated. The infection can spread deeper into the body and eventually into your blood, which can make you feel ill. Treatment usually involves draining the abscess. What are the causes? An abscess occurs when germs, often bacteria, pass through your skin and cause an infection. This may be caused by:  A scrape or cut on your skin.  A puncture wound through your skin, including a needle injection.  Blocked oil or sweat glands.  Blocked and infected hair follicles.  A cyst that forms beneath your skin (sebaceous cyst) and becomes infected. What increases the risk? This condition is more likely to develop in people who:  Have a weak body defense system (immune system).  Have diabetes.  Have dry and irritated skin.  Get frequent injections or use illegal IV drugs.  Have a foreign body in a wound, such as a splinter.  Have problems with their lymph system or veins. What are the signs or symptoms? An abscess may start as a painful, firm bump under the skin. Over time, the abscess may get larger or become softer. Pus may appear at the top of the abscess, causing pressure and pain. It may eventually break through the skin and drain. Other symptoms include:  Redness.  Warmth.  Swelling.  Tenderness.  A sore on the skin. How is this diagnosed? This condition is diagnosed based on your medical history and a physical exam. A sample of pus may be taken from the abscess to find out what is causing the infection and what antibiotics can be used to treat it. You also may have:  Blood tests to look for signs of infection or spread of an infection to your  blood.  Imaging studies such as ultrasound, CT scan, or MRI if the abscess is deep. How is this treated? Small abscesses that drain on their own may not need treatment. Treatment for an abscess that does not rupture on its own may include:  Warm compresses applied to the area several times per day.  Incision and drainage. Your health care provider will make an incision to open the abscess and will remove pus and any foreign body or dead tissue. The incision area may be packed with gauze to keep it open for a few days while it heals.  Antibiotic medicines to treat infection. For a severe abscess, you may first get antibiotics through an IV and then change to oral antibiotics. Follow these instructions at home: Abscess Care   If you have an abscess that has not drained, place a warm, clean, wet washcloth over the abscess several times a day. Do this as told by your health care provider.  Follow instructions from your health care provider about how to take care of your abscess. Make sure you:  Cover the abscess with a bandage (dressing).  Change your dressing or gauze as told by your health care provider.  Wash your hands with soap and water before you change the dressing or gauze. If soap and water are not available, use hand sanitizer.  Check your abscess every day for signs of a worsening infection. Check for:  More redness, swelling, or   pain.  More fluid or blood.  Warmth.  More pus or a bad smell. Medicines   Take over-the-counter and prescription medicines only as told by your health care provider.  If you were prescribed an antibiotic medicine, take it as told by your health care provider. Do not stop taking the antibiotic even if you start to feel better. General instructions   To avoid spreading the infection:  Do not share personal care items, towels, or hot tubs with others.  Avoid making skin contact with other people.  Keep all follow-up visits as told by your  health care provider. This is important. Contact a health care provider if:  You have more redness, swelling, or pain around your abscess.  You have more fluid or blood coming from your abscess.  Your abscess feels warm to the touch.  You have more pus or a bad smell coming from your abscess.  You have a fever.  You have muscle aches.  You have chills or a general ill feeling. Get help right away if:  You have severe pain.  You see red streaks on your skin spreading away from the abscess. This information is not intended to replace advice given to you by your health care provider. Make sure you discuss any questions you have with your health care provider. Document Released: 11/28/2004 Document Revised: 10/15/2015 Document Reviewed: 12/28/2014 [employer] Interactive Patient Education  2017 [employer]  

## 2016-07-23 NOTE — Progress Notes (Signed)
Subjective:  Patient ID: Kurt Reid, male    DOB: [birth year]  Age: 66 y.o. MRN: [id number]  CC: Insect Bite (left leg swollen,red,black going on for 1 wk. hx for MRSA. )   Rash  This is a new problem. The current episode started in the past 7 days. The problem is unchanged. The affected locations include the left upper leg. The rash is characterized by pain, redness and swelling. It is unknown if there was an exposure to a precipitant. Pertinent negatives include no congestion, cough, facial edema, fatigue, fever, joint pain, shortness of breath or sore throat. Past treatments include nothing.    Outpatient Medications Prior to Visit  Medication Sig Dispense Refill  . aspirin (ASPIRIN EC) 81 MG EC tablet Take 81 mg by mouth daily.      . B-D UF III MINI PEN NEEDLES 31G X 5 MM MISC USE AS DIRECTED ONCE DAILY 100 each 11  . desvenlafaxine (PRISTIQ) 50 MG 24 hr tablet Take 1 tablet (50 mg total) by mouth daily. 90 tablet 3  . fluticasone (FLONASE) 50 MCG/ACT nasal spray PLACE 2 SPRAYS INTO BOTH NOSTRILS DAILY. 16 g 3  . gabapentin (NEURONTIN) 300 MG capsule TAKE 2 CAPSULES BY MOUTH TWICE DAILY 180 capsule 2  . glucose blood (ONE TOUCH ULTRA TEST) test strip USE AS DIRECTED THREE TIMES DAILY 300 each 1  . HYDROcodone-acetaminophen (NORCO) 10-325 MG per tablet Take 1 tablet by mouth 2 (two) times daily as needed for severe pain.     Marland Kitchen lactulose (CHRONULAC) 10 GM/15ML solution Take 10 g by mouth daily as needed for mild constipation or moderate constipation.    . Lancets Misc. (ONE TOUCH SURESOFT) MISC Use as directed once per day 100 each 11  . omeprazole (PRILOSEC) 20 MG capsule Take 1 capsule (20 mg total) by mouth daily. 30 capsule 11  . propranolol (INDERAL) 20 MG tablet TAKE 1 TABLET BY MOUTH 2 TIMES DAILY 60 tablet 10  . triamcinolone (NASACORT AQ) 55 MCG/ACT AERO nasal inhaler Place 2 sprays into the nose daily. 1 Inhaler 12  . venlafaxine (EFFEXOR) 75 MG tablet TAKE 1 TABLET BY  MOUTH 3 TIMES DAILY WITH MEALS 270 tablet 3  . metFORMIN (GLUCOPHAGE-XR) 500 MG 24 hr tablet Take 2 tablets (1,000 mg total) by mouth daily with breakfast. (Patient not taking: Reported on 07/23/2016) 180 tablet 3   No facility-administered medications prior to visit.     ROS See HPI  Objective:  BP 122/80   Pulse 81   Temp 98.8 F (37.1 C)   Ht 6' (1.829 m)   Wt 227 lb (103 kg)   SpO2 96%   BMI 30.79 kg/m   BP Readings from Last 3 Encounters:  07/23/16 122/80  05/22/16 134/88  11/17/15 118/73    Wt Readings from Last 3 Encounters:  07/23/16 227 lb (103 kg)  05/22/16 235 lb (106.6 kg)  11/17/15 226 lb 6.4 oz (102.7 kg)    Physical Exam  Constitutional: He is oriented to person, place, and time.  Cardiovascular: Normal rate.   Pulmonary/Chest: Effort normal.  Musculoskeletal: He exhibits tenderness.       Left upper leg: He exhibits no tenderness and no bony tenderness.       Legs: Neurological: He is alert and oriented to person, place, and time.  Skin: Skin is dry. There is erythema.  Vitals reviewed.   Lab Results  Component Value Date   WBC 8.0 05/22/2016   HGB 13.8 05/22/2016  HCT 39.7 05/22/2016   PLT 133.0 (L) 05/22/2016   GLUCOSE 177 (H) 05/22/2016   CHOL 151 11/17/2015   TRIG 96.0 11/17/2015   HDL 42.50 11/17/2015   LDLDIRECT 81.5 11/02/2007   LDLCALC 89 11/17/2015   ALT 18 05/22/2016   AST 30 05/22/2016   NA 139 05/22/2016   K 4.1 05/22/2016   CL 106 05/22/2016   CREATININE 0.83 05/22/2016   BUN 10 05/22/2016   CO2 28 05/22/2016   TSH 1.83 05/22/2016   PSA 0.08 (L) 05/22/2016   INR 1.2 (H) 05/22/2016   HGBA1C 8.4 (H) 05/23/2016   MICROALBUR 0.7 10/27/2014    Dg Chest 2 View  Result Date: 12/10/2013 CLINICAL DATA:  Animal bite EXAM: CHEST  2 VIEW COMPARISON:  Prior radiograph from 06/03/2013 FINDINGS: The cardiac and mediastinal silhouettes are stable in size and contour, and remain within normal limits. The lungs are normally inflated.  No airspace consolidation, pleural effusion, or pulmonary edema is identified. There is no pneumothorax. No acute osseous abnormality identified. IMPRESSION: No active cardiopulmonary disease. Electronically Signed   By: Rise Mu M.D.   On: 12/10/2013 03:52   Dg Hand Complete Right  Result Date: 12/10/2013 CLINICAL DATA:  Dog bite to hand 3 days ago. Right hand pain, swelling, warmth, and erythema. Limited range of motion. Initial encounter. EXAM: RIGHT HAND - COMPLETE 3+ VIEW COMPARISON:  None. FINDINGS: Soft tissue swelling is present over the dorsum of the hand. No acute osseous abnormality is present. No radiopaque foreign body is present. The joints are located. IMPRESSION: Soft tissue swelling over the dorsum of the hand without an acute osseous abnormality or radiopaque foreign body. Electronically Signed   By: Gennette Pac M.D.   On: 12/10/2013 02:15    Assessment & Plan:   Kurt Reid was seen today for insect bite.  Diagnoses and all orders for this visit:  Carbuncle of thigh -     doxycycline (VIBRA-TABS) 100 MG tablet; Take 1 tablet (100 mg total) by mouth 2 (two) times daily. -     chlorhexidine (HIBICLENS) 4 % external liquid; Apply topically as directed. Bathe 3x/week. Avoid face and genitalia  MRSA (methicillin resistant Staphylococcus aureus) carrier -     doxycycline (VIBRA-TABS) 100 MG tablet; Take 1 tablet (100 mg total) by mouth 2 (two) times daily. -     chlorhexidine (HIBICLENS) 4 % external liquid; Apply topically as directed. Bathe 3x/week. Avoid face and genitalia   I am having [name] start on doxycycline and chlorhexidine. I am also having him maintain his aspirin, ONE TOUCH SURESOFT, B-D UF III MINI PEN NEEDLES, HYDROcodone-acetaminophen, lactulose, fluticasone, glucose blood, triamcinolone, desvenlafaxine, metFORMIN, omeprazole, gabapentin, venlafaxine, propranolol, spironolactone, RELION INSULIN SYRINGE 1ML/31G, and Insulin NPH Isophane & Regular  (RELION 70/30 [place]).  Meds ordered this encounter  Medications  . spironolactone (ALDACTONE) 50 MG tablet    Sig: Take 50 mg by mouth 1 day or 1 dose.    Refill:  4  . RELION INSULIN SYRINGE 1ML/31G 31G X 5/16" 1 ML MISC    Refill:  4  . Insulin NPH Isophane & Regular (RELION 70/30 [place])    Sig: Inject into the skin. Sliding scale Dr. Lilli Light  . doxycycline (VIBRA-TABS) 100 MG tablet    Sig: Take 1 tablet (100 mg total) by mouth 2 (two) times daily.    Dispense:  14 tablet    Refill:  0    Order Specific Question:   Supervising Provider    Answer:  PLOTNIKOV, ALEKSEI V [[id number]]  . chlorhexidine (HIBICLENS) 4 % external liquid    Sig: Apply topically as directed. Bathe 3x/week. Avoid face and genitalia    Dispense:  120 mL    Refill:  0    Order Specific Question:   Supervising Provider    Answer:   Tresa GarterPLOTNIKOV, ALEKSEI V [[id number]]    Follow-up: Return if symptoms worsen or fail to improve.  Alysia Pennaharlotte Nader Boys, NP

## 2016-10-22 ENCOUNTER — Other Ambulatory Visit: Payer: Self-pay | Admitting: Internal Medicine

## 2016-11-20 ENCOUNTER — Encounter: Payer: Self-pay | Admitting: Internal Medicine

## 2016-11-20 ENCOUNTER — Other Ambulatory Visit (INDEPENDENT_AMBULATORY_CARE_PROVIDER_SITE_OTHER): Payer: Medicare Other

## 2016-11-20 ENCOUNTER — Ambulatory Visit (INDEPENDENT_AMBULATORY_CARE_PROVIDER_SITE_OTHER): Payer: Medicare Other | Admitting: Internal Medicine

## 2016-11-20 VITALS — BP 128/74 | HR 79 | Temp 97.8°F | Ht 72.0 in | Wt 230.0 lb

## 2016-11-20 DIAGNOSIS — Z23 Encounter for immunization: Secondary | ICD-10-CM

## 2016-11-20 DIAGNOSIS — R935 Abnormal findings on diagnostic imaging of other abdominal regions, including retroperitoneum: Secondary | ICD-10-CM | POA: Diagnosis not present

## 2016-11-20 DIAGNOSIS — F329 Major depressive disorder, single episode, unspecified: Secondary | ICD-10-CM | POA: Diagnosis not present

## 2016-11-20 DIAGNOSIS — K219 Gastro-esophageal reflux disease without esophagitis: Secondary | ICD-10-CM | POA: Diagnosis not present

## 2016-11-20 DIAGNOSIS — E119 Type 2 diabetes mellitus without complications: Secondary | ICD-10-CM

## 2016-11-20 DIAGNOSIS — F32A Depression, unspecified: Secondary | ICD-10-CM

## 2016-11-20 LAB — CBC WITH DIFFERENTIAL/PLATELET
BASOS ABS: 0.1 10*3/uL (ref 0.0–0.1)
Basophils Relative: 1 % (ref 0.0–3.0)
EOS ABS: 0.4 10*3/uL (ref 0.0–0.7)
Eosinophils Relative: 5.2 % — ABNORMAL HIGH (ref 0.0–5.0)
HCT: 41.2 % (ref 39.0–52.0)
Hemoglobin: 14.1 g/dL (ref 13.0–17.0)
LYMPHS ABS: 1.7 10*3/uL (ref 0.7–4.0)
Lymphocytes Relative: 20.3 % (ref 12.0–46.0)
MCHC: 34.3 g/dL (ref 30.0–36.0)
MCV: 91.2 fl (ref 78.0–100.0)
Monocytes Absolute: 0.8 10*3/uL (ref 0.1–1.0)
Monocytes Relative: 9.1 % (ref 3.0–12.0)
NEUTROS ABS: 5.5 10*3/uL (ref 1.4–7.7)
Neutrophils Relative %: 64.4 % (ref 43.0–77.0)
PLATELETS: 124 10*3/uL — AB (ref 150.0–400.0)
RBC: 4.52 Mil/uL (ref 4.22–5.81)
RDW: 14.5 % (ref 11.5–15.5)
WBC: 8.5 10*3/uL (ref 4.0–10.5)

## 2016-11-20 LAB — BASIC METABOLIC PANEL
BUN: 14 mg/dL (ref 6–23)
CO2: 26 mEq/L (ref 19–32)
CREATININE: 0.87 mg/dL (ref 0.40–1.50)
Calcium: 8.8 mg/dL (ref 8.4–10.5)
Chloride: 105 mEq/L (ref 96–112)
GFR: 93.1 mL/min (ref >60.00)
Glucose, Bld: 157 mg/dL — ABNORMAL HIGH (ref 70–99)
Potassium: 3.8 mEq/L (ref 3.5–5.1)
Sodium: 136 mEq/L (ref 135–145)

## 2016-11-20 LAB — PROTIME-INR
INR: 1.3 ratio — AB (ref 0.8–1.0)
PROTHROMBIN TIME: 14 s — AB (ref 9.6–13.1)

## 2016-11-20 LAB — HEPATIC FUNCTION PANEL
ALT: 15 U/L (ref 0–53)
AST: 27 U/L (ref 0–37)
Albumin: 3.2 g/dL — ABNORMAL LOW (ref 3.5–5.2)
Alkaline Phosphatase: 132 U/L — ABNORMAL HIGH (ref 39–117)
BILIRUBIN TOTAL: 2 mg/dL — AB (ref 0.2–1.2)
Bilirubin, Direct: 0.6 mg/dL — ABNORMAL HIGH (ref 0.0–0.3)
Total Protein: 7.1 g/dL (ref 6.0–8.3)

## 2016-11-20 LAB — HEMOGLOBIN A1C: Hgb A1c MFr Bld: 7.9 % — ABNORMAL HIGH (ref 4.6–6.5)

## 2016-11-20 NOTE — Assessment & Plan Note (Signed)
stable overall by history and exam, denies SI or HI, and pt to continue medical treatment as before,  to f/u any worsening symptoms or concerns 

## 2016-11-20 NOTE — Assessment & Plan Note (Signed)
With ? Nodule by report, has planned MRI in 1 wk for f/u, for labs today as requested then fax to [facility] - Dr Meisinhemier

## 2016-11-20 NOTE — Progress Notes (Signed)
Subjective:    Patient ID: Kurt Reid, male    DOB: 03-09-1950, 66 y.o.   MRN: [id number]  HPI  Here to f/u; overall doing ok,  Pt denies chest pain, increasing sob or doe, wheezing, orthopnea, PND, increased LE swelling, palpitations, dizziness or syncope.  Pt denies new neurological symptoms such as new headache, or facial or extremity weakness or numbness.  Pt denies polydipsia, polyuria, or low sugar episode.  Pt states overall good compliance with meds, mostly trying to follow appropriate diet, with wt overall stable,  but little exercise however  Denies worsening depressive symptoms, suicidal ideation, or panic BP Readings from Last 3 Encounters:  11/20/16 128/74  07/23/16 122/80  05/22/16 134/88   Wt Readings from Last 3 Encounters:  11/20/16 230 lb (104.3 kg)  07/23/16 227 lb (103 kg)  05/22/16 235 lb (106.6 kg)  Seeing [facility] - for oct 5 egd with variceal tx if needed.  MRI liver for 1 wk from today.  Denies worsening reflux, abd pain other than recurring chronic ruq discomfort mild intermitten, and no dysphagia, n/v, bowel change or blood. Needs mult labs per GI. Past Medical History:  Diagnosis Date  . ADHD 09/23/2006  . ADHD (attention deficit hyperactivity disorder)   . Allergic rhinitis   . ALLERGIC RHINITIS 09/23/2006  . Anxiety   . ANXIETY 04/19/2009  . Benign prostatic hypertrophy   . BENIGN PROSTATIC HYPERTROPHY 11/02/2007  . Bipolar 1 disorder (HCC)   . BIPOLAR AFFECTIVE DISORDER 09/23/2006  . Chronic pain syndrome 01/18/2009  . 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 Depression   . DEPRESSION 09/23/2006  . Diabetes mellitus without complication (HCC)   . Diverticulosis of colon   . DIVERTICULOSIS, COLON 04/19/2009  . ESOPHAGEAL VARICES 04/19/2009  . ETOH abuse    hx  . GERD 09/23/2006  . GERD (gastroesophageal reflux disease)   . Hepatitis C   . HEPATITIS C 09/23/2006  . Hyperlipidemia   . HYPERLIPIDEMIA 09/23/2006  . INSOMNIA-SLEEP  DISORDER-UNSPEC 04/19/2009  . Low back pain    chronic  . LOW BACK PAIN 09/23/2006  . Migraine   . MRSA (methicillin resistant staph aureus) culture positive   . Nephrolithiasis    hx  . NEPHROLITHIASIS, HX OF 11/02/2007  . SEIZURE DISORDER 09/23/2006  . Seizure disorder (HCC)    H/O  . Sinusitis    recurrent   Past Surgical History:  Procedure Laterality Date  . back surgury     x 5  . cheekbone     hx of fx  . I&D EXTREMITY Right 12/10/2013   Procedure: IRRIGATION AND DEBRIDEMENT OF MULTIPLE DOG BITES RIGHT HAND AND WRIST;  Surgeon: Dominica Severin, MD;  Location: [facility] OR;  Service: Orthopedics;  Laterality: Right;  . knee surgury     x 1  . s/p sinus surgury  2010   Dr. Zara Chess  . TONSILLECTOMY      reports that he has quit smoking. He has never used smokeless tobacco. He reports that he does not drink alcohol or use drugs. family history includes Asthma in his daughter; Dementia in his father; Depression in his mother; Hyperlipidemia in his mother; Hypertension in his mother; Stroke in his mother. Allergies  Allergen Reactions  . Duloxetine     REACTION: memory dysfunction, dizziness  . Latex Other (See Comments)    Reaction unknown  . Levofloxacin Nausea Only  . Mercury Other (See Comments)    Reaction unknown  .  Sulfonamide Derivatives Other (See Comments)    Reaction unknown   Current Outpatient Prescriptions on File Prior to Visit  Medication Sig Dispense Refill  . aspirin (ASPIRIN EC) 81 MG EC tablet Take 81 mg by mouth daily.      . B-D UF III MINI PEN NEEDLES 31G X 5 MM MISC USE AS DIRECTED ONCE DAILY 100 each 11  . chlorhexidine (HIBICLENS) 4 % external liquid Apply topically as directed. Bathe 3x/week. Avoid face and genitalia 120 mL 0  . desvenlafaxine (PRISTIQ) 50 MG 24 hr tablet Take 1 tablet (50 mg total) by mouth daily. 90 tablet 3  . fluticasone (FLONASE) 50 MCG/ACT nasal spray PLACE 2 SPRAYS INTO BOTH NOSTRILS DAILY. 16 g 3  . gabapentin (NEURONTIN)  300 MG capsule TAKE 2 CAPSULES BY MOUTH TWICE DAILY 180 capsule 2  . glucose blood (ONE TOUCH ULTRA TEST) test strip USE AS DIRECTED THREE TIMES DAILY 300 each 1  . HYDROcodone-acetaminophen (NORCO) 10-325 MG per tablet Take 1 tablet by mouth 2 (two) times daily as needed for severe pain.     . Insulin NPH Isophane & Regular (RELION 70/30 [place]) Inject into the skin. Sliding scale Dr. Lilli Light    . lactulose (CHRONULAC) 10 GM/15ML solution Take 10 g by mouth daily as needed for mild constipation or moderate constipation.    . Lancets Misc. (ONE TOUCH SURESOFT) MISC Use as directed once per day 100 each 11  . metFORMIN (GLUCOPHAGE-XR) 500 MG 24 hr tablet Take 2 tablets (1,000 mg total) by mouth daily with breakfast. 180 tablet 3  . omeprazole (PRILOSEC) 20 MG capsule Take 1 capsule (20 mg total) by mouth daily. 30 capsule 11  . propranolol (INDERAL) 20 MG tablet TAKE 1 TABLET BY MOUTH 2 TIMES DAILY 60 tablet 10  . RELION INSULIN SYRINGE 1ML/31G 31G X 5/16" 1 ML MISC   4  . spironolactone (ALDACTONE) 50 MG tablet Take 50 mg by mouth 1 day or 1 dose.  4  . triamcinolone (NASACORT AQ) 55 MCG/ACT AERO nasal inhaler Place 2 sprays into the nose daily. 1 Inhaler 12  . venlafaxine (EFFEXOR) 75 MG tablet TAKE 1 TABLET BY MOUTH 3 TIMES DAILY WITH MEALS 270 tablet 3   No current facility-administered medications on file prior to visit.    Review of Systems  Constitutional: Negative for other unusual diaphoresis or sweats HENT: Negative for ear discharge or swelling Eyes: Negative for other worsening visual disturbances Respiratory: Negative for stridor or other swelling  Gastrointestinal: Negative for worsening distension or other blood Genitourinary: Negative for retention or other urinary change Musculoskeletal: Negative for other MSK pain or swelling Skin: Negative for color change or other new lesions Neurological: Negative for worsening tremors and other numbness  Psychiatric/Behavioral: Negative for  worsening agitation or other fatigue All other system neg per pt    Objective:   Physical Exam BP 128/74   Pulse 79   Temp 97.8 F (36.6 C) (Oral)   Ht 6' (1.829 m)   Wt 230 lb (104.3 kg)   SpO2 98%   BMI 31.19 kg/m  VS noted, not ill appearing, obese with gynecomastia drug induced Constitutional: Pt appears in NAD HENT: Head: NCAT.  Right Ear: External ear normal.  Left Ear: External ear normal.  Eyes: . Pupils are equal, round, and reactive to light. Conjunctivae and EOM are normal Nose: without d/c or deformity Neck: Neck supple. Gross normal ROM Cardiovascular: Normal rate and regular rhythm.   Pulmonary/Chest: Effort normal and  breath sounds without rales or wheezing.  Abd:  Soft, NT, ND, + BS, no organomegaly Neurological: Pt is alert. At baseline orientation, motor grossly intact Skin: Skin is warm. No rashes, other new lesions, no LE edema Psychiatric: Pt behavior is normal without agitation  No other exam findings  Lab Results  Component Value Date   WBC 8.0 05/22/2016   HGB 13.8 05/22/2016   HCT 39.7 05/22/2016   PLT 133.0 (L) 05/22/2016   GLUCOSE 177 (H) 05/22/2016   CHOL 151 11/17/2015   TRIG 96.0 11/17/2015   HDL 42.50 11/17/2015   LDLDIRECT 81.5 11/02/2007   LDLCALC 89 11/17/2015   ALT 18 05/22/2016   AST 30 05/22/2016   NA 139 05/22/2016   K 4.1 05/22/2016   CL 106 05/22/2016   CREATININE 0.83 05/22/2016   BUN 10 05/22/2016   CO2 28 05/22/2016   TSH 1.83 05/22/2016   PSA 0.08 (L) 05/22/2016   INR 1.2 (H) 05/22/2016   HGBA1C 8.4 (H) 05/23/2016   MICROALBUR 0.7 10/27/2014        Assessment & Plan:

## 2016-11-20 NOTE — Patient Instructions (Addendum)
You had the flu shot today  Please continue all other medications as before, and refills have been done if requested.  Please have the pharmacy call with any other refills you may need.  Please continue your efforts at being more active, low cholesterol diet, and weight control.  You are otherwise up to date with prevention measures today.  Please keep your appointments with your specialists as you may have planned  Please go to the LAB in the Basement (turn left off the elevator) for the tests to be done today  We will fax the results to GI in [place]  You will be contacted by phone if any changes need to be made immediately.  Otherwise, you will receive a letter about your results with an explanation, but please check with MyChart first.  Please remember to sign up for MyChart if you have not done so, as this will be important to you in the future with finding out test results, communicating by private email, and scheduling acute appointments online when needed.  Please return in 6 months, or sooner if needed, with Lab testing done 3-5 days before

## 2016-11-20 NOTE — Assessment & Plan Note (Signed)
Stable, asympt, for egd soon for varices evaluation

## 2016-11-20 NOTE — Assessment & Plan Note (Signed)
Has had suboptimal control but improved in the past yr,, encouraged diet complaince, o/w stable overall by history and exam, recent data reviewed with pt, and pt to continue medical treatment as before,  to f/u any worsening symptoms or concerns Lab Results  Component Value Date   HGBA1C 8.4 (H) 05/23/2016  for f/u lab today

## 2016-11-21 ENCOUNTER — Encounter: Payer: Self-pay | Admitting: Internal Medicine

## 2016-11-28 ENCOUNTER — Telehealth: Payer: Self-pay | Admitting: Internal Medicine

## 2016-11-28 DIAGNOSIS — R935 Abnormal findings on diagnostic imaging of other abdominal regions, including retroperitoneum: Secondary | ICD-10-CM

## 2016-11-28 NOTE — Telephone Encounter (Signed)
Do you have a contact number? All labs that were down that day was faxed over.

## 2016-11-28 NOTE — Telephone Encounter (Signed)
Dr Jennye Boroughs is needing [place] to send over Asp test over to there office, all labs were to be sent over there.  This is the pt Gi Dr   Fax number [phone]

## 2016-12-03 NOTE — Telephone Encounter (Signed)
Tried to call pt to come in to get labs done. Home number is not working & left a msg on cell. Spoke to [facility] to verify pt's phone#s and inform them we are unable to get ahold of pt.

## 2016-12-03 NOTE — Telephone Encounter (Signed)
Per Lawson Fiscal.. Labs from 9/18 was not done. Per Felicia in the lab was not entered future so labs was not completed. MD ok to reorder future, Lawson Fiscal will notify pt to come back to have done...Raechel Chute

## 2016-12-05 NOTE — Telephone Encounter (Signed)
Spoke to pt's wife and they will be calling [facility] regarding the labs that are needed

## 2017-03-03 ENCOUNTER — Other Ambulatory Visit: Payer: Self-pay | Admitting: Internal Medicine

## 2017-03-20 ENCOUNTER — Ambulatory Visit: Payer: Self-pay | Admitting: *Deleted

## 2017-03-20 NOTE — Telephone Encounter (Signed)
Pt's wife is tearful expressing that she does not know what else to do to help her husband to get better. Pt's wife states that he has stage IV liver dz and has been complaining of pain in the right side of chest and abdomen, which has been present for approximately 2 weeks.Pt's wife states that the pt is too weak to walk and has not been eating or getting out of bed. Pt [place] [facility] [place].Pt's wife attempted to contact Gastroenterologist but was told that the pt's provider was out of the office today and would not be available tomorrow. Pt's wife states she feels the pt has been declining and has not been eating or getting out of bed. Pt's wife requesting appt to tomorrow, because she does not feel the pt can wait until Monday for an appt with Gastroenterologist. Attempted to schecule appt with PCP but no appts available. Samantha,Flow Coordinator contacted at the office to see if pt could be squeezed in for appt but no availability.Pts wife states she will not be taking the pt back to the ED Appt scheduled with Nanine MeansLaura Murray,NP on 1/18.

## 2017-03-21 ENCOUNTER — Encounter: Payer: Self-pay | Admitting: Family

## 2017-03-21 ENCOUNTER — Inpatient Hospital Stay (HOSPITAL_COMMUNITY)
Admission: EM | Admit: 2017-03-21 | Discharge: 2017-04-04 | DRG: 871 | Disposition: E | Payer: Medicare Other | Attending: Internal Medicine | Admitting: Internal Medicine

## 2017-03-21 ENCOUNTER — Other Ambulatory Visit: Payer: Self-pay

## 2017-03-21 ENCOUNTER — Inpatient Hospital Stay (HOSPITAL_COMMUNITY): Payer: Medicare Other

## 2017-03-21 ENCOUNTER — Emergency Department (HOSPITAL_COMMUNITY): Payer: Medicare Other

## 2017-03-21 ENCOUNTER — Encounter (HOSPITAL_COMMUNITY): Payer: Self-pay

## 2017-03-21 ENCOUNTER — Ambulatory Visit: Payer: Medicare Other | Admitting: Family

## 2017-03-21 VITALS — BP 122/72 | HR 91 | Temp 99.5°F | Ht 73.0 in | Wt 228.1 lb

## 2017-03-21 DIAGNOSIS — K219 Gastro-esophageal reflux disease without esophagitis: Secondary | ICD-10-CM | POA: Diagnosis present

## 2017-03-21 DIAGNOSIS — F05 Delirium due to known physiological condition: Secondary | ICD-10-CM | POA: Diagnosis present

## 2017-03-21 DIAGNOSIS — J851 Abscess of lung with pneumonia: Secondary | ICD-10-CM

## 2017-03-21 DIAGNOSIS — M25472 Effusion, left ankle: Secondary | ICD-10-CM | POA: Diagnosis not present

## 2017-03-21 DIAGNOSIS — M25552 Pain in left hip: Secondary | ICD-10-CM | POA: Diagnosis present

## 2017-03-21 DIAGNOSIS — E872 Acidosis: Secondary | ICD-10-CM | POA: Diagnosis present

## 2017-03-21 DIAGNOSIS — G9341 Metabolic encephalopathy: Secondary | ICD-10-CM | POA: Diagnosis not present

## 2017-03-21 DIAGNOSIS — R338 Other retention of urine: Secondary | ICD-10-CM | POA: Diagnosis not present

## 2017-03-21 DIAGNOSIS — K7469 Other cirrhosis of liver: Secondary | ICD-10-CM | POA: Diagnosis not present

## 2017-03-21 DIAGNOSIS — J309 Allergic rhinitis, unspecified: Secondary | ICD-10-CM | POA: Diagnosis present

## 2017-03-21 DIAGNOSIS — D72829 Elevated white blood cell count, unspecified: Secondary | ICD-10-CM | POA: Diagnosis not present

## 2017-03-21 DIAGNOSIS — D696 Thrombocytopenia, unspecified: Secondary | ICD-10-CM | POA: Diagnosis not present

## 2017-03-21 DIAGNOSIS — E8770 Fluid overload, unspecified: Secondary | ICD-10-CM | POA: Diagnosis not present

## 2017-03-21 DIAGNOSIS — M25572 Pain in left ankle and joints of left foot: Secondary | ICD-10-CM

## 2017-03-21 DIAGNOSIS — Z8781 Personal history of (healed) traumatic fracture: Secondary | ICD-10-CM | POA: Diagnosis not present

## 2017-03-21 DIAGNOSIS — E875 Hyperkalemia: Secondary | ICD-10-CM | POA: Diagnosis not present

## 2017-03-21 DIAGNOSIS — I8500 Esophageal varices without bleeding: Secondary | ICD-10-CM | POA: Diagnosis present

## 2017-03-21 DIAGNOSIS — R17 Unspecified jaundice: Secondary | ICD-10-CM

## 2017-03-21 DIAGNOSIS — Z794 Long term (current) use of insulin: Secondary | ICD-10-CM

## 2017-03-21 DIAGNOSIS — G4700 Insomnia, unspecified: Secondary | ICD-10-CM | POA: Diagnosis not present

## 2017-03-21 DIAGNOSIS — R042 Hemoptysis: Secondary | ICD-10-CM | POA: Diagnosis present

## 2017-03-21 DIAGNOSIS — K767 Hepatorenal syndrome: Secondary | ICD-10-CM | POA: Diagnosis not present

## 2017-03-21 DIAGNOSIS — R627 Adult failure to thrive: Secondary | ICD-10-CM | POA: Diagnosis present

## 2017-03-21 DIAGNOSIS — N179 Acute kidney failure, unspecified: Secondary | ICD-10-CM | POA: Diagnosis present

## 2017-03-21 DIAGNOSIS — Z515 Encounter for palliative care: Secondary | ICD-10-CM

## 2017-03-21 DIAGNOSIS — M00072 Staphylococcal arthritis, left ankle and foot: Secondary | ICD-10-CM | POA: Diagnosis present

## 2017-03-21 DIAGNOSIS — Z560 Unemployment, unspecified: Secondary | ICD-10-CM | POA: Diagnosis not present

## 2017-03-21 DIAGNOSIS — Z91128 Patient's intentional underdosing of medication regimen for other reason: Secondary | ICD-10-CM

## 2017-03-21 DIAGNOSIS — I2690 Septic pulmonary embolism without acute cor pulmonale: Secondary | ICD-10-CM | POA: Diagnosis not present

## 2017-03-21 DIAGNOSIS — A419 Sepsis, unspecified organism: Secondary | ICD-10-CM | POA: Diagnosis present

## 2017-03-21 DIAGNOSIS — K7460 Unspecified cirrhosis of liver: Secondary | ICD-10-CM

## 2017-03-21 DIAGNOSIS — E860 Dehydration: Secondary | ICD-10-CM | POA: Diagnosis not present

## 2017-03-21 DIAGNOSIS — J189 Pneumonia, unspecified organism: Secondary | ICD-10-CM | POA: Diagnosis not present

## 2017-03-21 DIAGNOSIS — Z87891 Personal history of nicotine dependence: Secondary | ICD-10-CM

## 2017-03-21 DIAGNOSIS — Z66 Do not resuscitate: Secondary | ICD-10-CM | POA: Diagnosis not present

## 2017-03-21 DIAGNOSIS — F319 Bipolar disorder, unspecified: Secondary | ICD-10-CM | POA: Diagnosis present

## 2017-03-21 DIAGNOSIS — M25551 Pain in right hip: Secondary | ICD-10-CM | POA: Diagnosis not present

## 2017-03-21 DIAGNOSIS — K7201 Acute and subacute hepatic failure with coma: Secondary | ICD-10-CM | POA: Diagnosis not present

## 2017-03-21 DIAGNOSIS — A4102 Sepsis due to Methicillin resistant Staphylococcus aureus: Secondary | ICD-10-CM | POA: Diagnosis present

## 2017-03-21 DIAGNOSIS — L02213 Cutaneous abscess of chest wall: Secondary | ICD-10-CM | POA: Diagnosis not present

## 2017-03-21 DIAGNOSIS — E1169 Type 2 diabetes mellitus with other specified complication: Secondary | ICD-10-CM | POA: Diagnosis present

## 2017-03-21 DIAGNOSIS — N19 Unspecified kidney failure: Secondary | ICD-10-CM | POA: Diagnosis not present

## 2017-03-21 DIAGNOSIS — Z8614 Personal history of Methicillin resistant Staphylococcus aureus infection: Secondary | ICD-10-CM

## 2017-03-21 DIAGNOSIS — M009 Pyogenic arthritis, unspecified: Secondary | ICD-10-CM | POA: Diagnosis not present

## 2017-03-21 DIAGNOSIS — E669 Obesity, unspecified: Secondary | ICD-10-CM | POA: Diagnosis present

## 2017-03-21 DIAGNOSIS — K659 Peritonitis, unspecified: Secondary | ICD-10-CM | POA: Diagnosis not present

## 2017-03-21 DIAGNOSIS — E785 Hyperlipidemia, unspecified: Secondary | ICD-10-CM | POA: Diagnosis present

## 2017-03-21 DIAGNOSIS — R188 Other ascites: Secondary | ICD-10-CM

## 2017-03-21 DIAGNOSIS — K920 Hematemesis: Secondary | ICD-10-CM | POA: Diagnosis not present

## 2017-03-21 DIAGNOSIS — T473X6A Underdosing of saline and osmotic laxatives, initial encounter: Secondary | ICD-10-CM | POA: Diagnosis present

## 2017-03-21 DIAGNOSIS — R339 Retention of urine, unspecified: Secondary | ICD-10-CM | POA: Diagnosis not present

## 2017-03-21 DIAGNOSIS — R410 Disorientation, unspecified: Secondary | ICD-10-CM

## 2017-03-21 DIAGNOSIS — M869 Osteomyelitis, unspecified: Secondary | ICD-10-CM | POA: Diagnosis present

## 2017-03-21 DIAGNOSIS — I361 Nonrheumatic tricuspid (valve) insufficiency: Secondary | ICD-10-CM | POA: Diagnosis not present

## 2017-03-21 DIAGNOSIS — Z8619 Personal history of other infectious and parasitic diseases: Secondary | ICD-10-CM

## 2017-03-21 DIAGNOSIS — I4891 Unspecified atrial fibrillation: Secondary | ICD-10-CM | POA: Diagnosis present

## 2017-03-21 DIAGNOSIS — M549 Dorsalgia, unspecified: Secondary | ICD-10-CM | POA: Diagnosis not present

## 2017-03-21 DIAGNOSIS — K766 Portal hypertension: Secondary | ICD-10-CM | POA: Diagnosis present

## 2017-03-21 DIAGNOSIS — E871 Hypo-osmolality and hyponatremia: Secondary | ICD-10-CM | POA: Diagnosis present

## 2017-03-21 DIAGNOSIS — N390 Urinary tract infection, site not specified: Secondary | ICD-10-CM

## 2017-03-21 DIAGNOSIS — J852 Abscess of lung without pneumonia: Secondary | ICD-10-CM | POA: Diagnosis not present

## 2017-03-21 DIAGNOSIS — Z7982 Long term (current) use of aspirin: Secondary | ICD-10-CM

## 2017-03-21 DIAGNOSIS — Z7189 Other specified counseling: Secondary | ICD-10-CM | POA: Diagnosis not present

## 2017-03-21 DIAGNOSIS — T797XXA Traumatic subcutaneous emphysema, initial encounter: Secondary | ICD-10-CM | POA: Diagnosis not present

## 2017-03-21 DIAGNOSIS — R918 Other nonspecific abnormal finding of lung field: Secondary | ICD-10-CM | POA: Diagnosis not present

## 2017-03-21 DIAGNOSIS — K3189 Other diseases of stomach and duodenum: Secondary | ICD-10-CM | POA: Diagnosis present

## 2017-03-21 DIAGNOSIS — Z91040 Latex allergy status: Secondary | ICD-10-CM

## 2017-03-21 DIAGNOSIS — R34 Anuria and oliguria: Secondary | ICD-10-CM | POA: Diagnosis not present

## 2017-03-21 DIAGNOSIS — E119 Type 2 diabetes mellitus without complications: Secondary | ICD-10-CM

## 2017-03-21 DIAGNOSIS — B182 Chronic viral hepatitis C: Secondary | ICD-10-CM | POA: Diagnosis not present

## 2017-03-21 DIAGNOSIS — G894 Chronic pain syndrome: Secondary | ICD-10-CM | POA: Diagnosis not present

## 2017-03-21 DIAGNOSIS — Z888 Allergy status to other drugs, medicaments and biological substances status: Secondary | ICD-10-CM

## 2017-03-21 DIAGNOSIS — R4182 Altered mental status, unspecified: Secondary | ICD-10-CM | POA: Diagnosis present

## 2017-03-21 DIAGNOSIS — M160 Bilateral primary osteoarthritis of hip: Secondary | ICD-10-CM | POA: Diagnosis present

## 2017-03-21 DIAGNOSIS — Z882 Allergy status to sulfonamides status: Secondary | ICD-10-CM

## 2017-03-21 DIAGNOSIS — B9562 Methicillin resistant Staphylococcus aureus infection as the cause of diseases classified elsewhere: Secondary | ICD-10-CM | POA: Diagnosis not present

## 2017-03-21 DIAGNOSIS — N170 Acute kidney failure with tubular necrosis: Secondary | ICD-10-CM | POA: Diagnosis not present

## 2017-03-21 DIAGNOSIS — K7290 Hepatic failure, unspecified without coma: Secondary | ICD-10-CM | POA: Diagnosis not present

## 2017-03-21 DIAGNOSIS — R222 Localized swelling, mass and lump, trunk: Secondary | ICD-10-CM | POA: Diagnosis not present

## 2017-03-21 DIAGNOSIS — I8510 Secondary esophageal varices without bleeding: Secondary | ICD-10-CM | POA: Diagnosis not present

## 2017-03-21 DIAGNOSIS — R400 Somnolence: Secondary | ICD-10-CM | POA: Diagnosis not present

## 2017-03-21 DIAGNOSIS — Z8349 Family history of other endocrine, nutritional and metabolic diseases: Secondary | ICD-10-CM

## 2017-03-21 DIAGNOSIS — R7881 Bacteremia: Secondary | ICD-10-CM | POA: Diagnosis not present

## 2017-03-21 DIAGNOSIS — Z810 Family history of intellectual disabilities: Secondary | ICD-10-CM | POA: Diagnosis not present

## 2017-03-21 DIAGNOSIS — R233 Spontaneous ecchymoses: Secondary | ICD-10-CM | POA: Diagnosis not present

## 2017-03-21 DIAGNOSIS — Z818 Family history of other mental and behavioral disorders: Secondary | ICD-10-CM

## 2017-03-21 DIAGNOSIS — IMO0002 Reserved for concepts with insufficient information to code with codable children: Secondary | ICD-10-CM

## 2017-03-21 DIAGNOSIS — Z6829 Body mass index (BMI) 29.0-29.9, adult: Secondary | ICD-10-CM

## 2017-03-21 LAB — COMPREHENSIVE METABOLIC PANEL
ALT: 28 U/L (ref 17–63)
AST: 53 U/L — AB (ref 15–41)
Albumin: 2.4 g/dL — ABNORMAL LOW (ref 3.5–5.0)
Alkaline Phosphatase: 180 U/L — ABNORMAL HIGH (ref 38–126)
Anion gap: 10 (ref 5–15)
BUN: 24 mg/dL — AB (ref 6–20)
CALCIUM: 8.3 mg/dL — AB (ref 8.9–10.3)
CHLORIDE: 94 mmol/L — AB (ref 101–111)
CO2: 22 mmol/L (ref 22–32)
CREATININE: 1.15 mg/dL (ref 0.61–1.24)
GFR calc non Af Amer: >60 mL/min (ref >60)
GLUCOSE: 320 mg/dL — AB (ref 65–99)
Potassium: 4.4 mmol/L (ref 3.5–5.1)
SODIUM: 126 mmol/L — AB (ref 135–145)
Total Bilirubin: 4.4 mg/dL — ABNORMAL HIGH (ref 0.3–1.2)
Total Protein: 6.9 g/dL (ref 6.5–8.1)

## 2017-03-21 LAB — CBC WITH DIFFERENTIAL/PLATELET
BASOS PCT: 0 %
Basophils Absolute: 0 10*3/uL (ref 0.0–0.1)
EOS ABS: 0 10*3/uL (ref 0.0–0.7)
EOS PCT: 0 %
HCT: 38.6 % — ABNORMAL LOW (ref 39.0–52.0)
Hemoglobin: 13.6 g/dL (ref 13.0–17.0)
LYMPHS PCT: 7 %
Lymphs Abs: 2.1 10*3/uL (ref 0.7–4.0)
MCH: 31.1 pg (ref 26.0–34.0)
MCHC: 35.2 g/dL (ref 30.0–36.0)
MCV: 88.3 fL (ref 78.0–100.0)
Monocytes Absolute: 0.9 10*3/uL (ref 0.1–1.0)
Monocytes Relative: 3 %
NEUTROS PCT: 90 %
Neutro Abs: 27.2 10*3/uL — ABNORMAL HIGH (ref 1.7–7.7)
PLATELETS: 208 10*3/uL (ref 150–400)
RBC: 4.37 MIL/uL (ref 4.22–5.81)
RDW: 14.7 % (ref 11.5–15.5)
WBC: 30.2 10*3/uL — ABNORMAL HIGH (ref 4.0–10.5)

## 2017-03-21 LAB — URINALYSIS, ROUTINE W REFLEX MICROSCOPIC
Glucose, UA: 150 mg/dL — AB
KETONES UR: NEGATIVE mg/dL
Nitrite: POSITIVE — AB
PH: 5 (ref 5.0–8.0)
PROTEIN: 30 mg/dL — AB
SQUAMOUS EPITHELIAL / LPF: NONE SEEN
Specific Gravity, Urine: 1.018 (ref 1.005–1.030)

## 2017-03-21 LAB — I-STAT CG4 LACTIC ACID, ED
LACTIC ACID, VENOUS: 2.9 mmol/L — AB (ref 0.5–1.9)
Lactic Acid, Venous: 2.74 mmol/L (ref 0.5–1.9)

## 2017-03-21 LAB — AMMONIA: Ammonia: 37 umol/L — ABNORMAL HIGH (ref 9–35)

## 2017-03-21 LAB — LIPASE, BLOOD: Lipase: 30 U/L (ref 11–51)

## 2017-03-21 LAB — GLUCOSE, CAPILLARY: GLUCOSE-CAPILLARY: 246 mg/dL — AB (ref 65–99)

## 2017-03-21 LAB — SEDIMENTATION RATE: SED RATE: 74 mm/h — AB (ref 0–16)

## 2017-03-21 LAB — CBG MONITORING, ED: Glucose-Capillary: 244 mg/dL — ABNORMAL HIGH (ref 65–99)

## 2017-03-21 MED ORDER — IOPAMIDOL (ISOVUE-300) INJECTION 61%
100.0000 mL | Freq: Once | INTRAVENOUS | Status: AC | PRN
  Administered 2017-03-21: 100 mL via INTRAVENOUS

## 2017-03-21 MED ORDER — SODIUM CHLORIDE 0.9 % IV SOLN
INTRAVENOUS | Status: DC
  Administered 2017-03-21 – 2017-03-24 (×5): via INTRAVENOUS

## 2017-03-21 MED ORDER — SPIRONOLACTONE 50 MG PO TABS: 50.0000 mg | ORAL_TABLET | ORAL | Status: DC

## 2017-03-21 MED ORDER — GUAIFENESIN-DM 100-10 MG/5ML PO SYRP
5.0000 mL | ORAL_SOLUTION | ORAL | Status: DC | PRN
  Administered 2017-03-21 – 2017-03-27 (×7): 5 mL via ORAL
  Filled 2017-03-21 (×8): qty 10

## 2017-03-21 MED ORDER — ACETAMINOPHEN 325 MG PO TABS: 650.0000 mg | ORAL_TABLET | Freq: Four times a day (QID) | ORAL | Status: DC | PRN

## 2017-03-21 MED ORDER — SODIUM CHLORIDE 0.9% FLUSH: 3.0000 mL | INTRAVENOUS | Status: DC | PRN

## 2017-03-21 MED ORDER — ONDANSETRON HCL 4 MG/2ML IJ SOLN
4.0000 mg | Freq: Four times a day (QID) | INTRAMUSCULAR | Status: DC | PRN
  Administered 2017-03-27: 4 mg via INTRAVENOUS
  Filled 2017-03-21: qty 2

## 2017-03-21 MED ORDER — SODIUM CHLORIDE 0.9% FLUSH
3.0000 mL | Freq: Two times a day (BID) | INTRAVENOUS | Status: DC
  Administered 2017-03-24 – 2017-04-02 (×17): 3 mL via INTRAVENOUS

## 2017-03-21 MED ORDER — DEXTROSE 5 % IV SOLN
2.0000 g | INTRAVENOUS | Status: DC
  Administered 2017-03-21 – 2017-03-23 (×3): 2 g via INTRAVENOUS
  Filled 2017-03-21 (×5): qty 2

## 2017-03-21 MED ORDER — VENLAFAXINE HCL ER 75 MG PO CP24: 75.0000 mg | ORAL_CAPSULE | Freq: Every day | ORAL | Status: DC

## 2017-03-21 MED ORDER — SODIUM CHLORIDE 0.9 % IV SOLN
250.0000 mL | INTRAVENOUS | Status: DC | PRN
  Administered 2017-03-28: 15:00:00 via INTRAVENOUS
  Administered 2017-03-30 – 2017-04-03 (×2): 250 mL via INTRAVENOUS

## 2017-03-21 MED ORDER — ONDANSETRON HCL 4 MG/2ML IJ SOLN
4.0000 mg | Freq: Once | INTRAMUSCULAR | Status: AC
  Administered 2017-03-21: 4 mg via INTRAVENOUS
  Filled 2017-03-21: qty 2

## 2017-03-21 MED ORDER — IBUPROFEN 200 MG PO TABS
600.0000 mg | ORAL_TABLET | Freq: Four times a day (QID) | ORAL | Status: DC | PRN
  Administered 2017-03-21 – 2017-03-23 (×5): 600 mg via ORAL
  Filled 2017-03-21 (×5): qty 3

## 2017-03-21 MED ORDER — ACETAMINOPHEN 650 MG RE SUPP: 650.0000 mg | Freq: Four times a day (QID) | RECTAL | Status: DC | PRN

## 2017-03-21 MED ORDER — INSULIN ASPART 100 UNIT/ML ~~LOC~~ SOLN
0.0000 [IU] | Freq: Three times a day (TID) | SUBCUTANEOUS | Status: DC
  Administered 2017-03-21 – 2017-03-22 (×3): 3 [IU] via SUBCUTANEOUS
  Administered 2017-03-22: 7 [IU] via SUBCUTANEOUS
  Administered 2017-03-23 (×2): 5 [IU] via SUBCUTANEOUS
  Administered 2017-03-23: 7 [IU] via SUBCUTANEOUS
  Administered 2017-03-24 (×2): 5 [IU] via SUBCUTANEOUS
  Administered 2017-03-24 – 2017-03-25 (×3): 3 [IU] via SUBCUTANEOUS

## 2017-03-21 MED ORDER — PANTOPRAZOLE SODIUM 40 MG PO TBEC
40.0000 mg | DELAYED_RELEASE_TABLET | Freq: Every day | ORAL | Status: DC
  Administered 2017-03-21 – 2017-03-27 (×7): 40 mg via ORAL
  Filled 2017-03-21 (×7): qty 1

## 2017-03-21 MED ORDER — ALBUTEROL SULFATE (2.5 MG/3ML) 0.083% IN NEBU: 2.5000 mg | INHALATION_SOLUTION | RESPIRATORY_TRACT | Status: DC | PRN

## 2017-03-21 MED ORDER — LACTULOSE 10 GM/15ML PO SOLN
30.0000 g | Freq: Three times a day (TID) | ORAL | Status: DC
  Administered 2017-03-21 – 2017-03-23 (×5): 30 g via ORAL
  Filled 2017-03-21 (×5): qty 45

## 2017-03-21 MED ORDER — MORPHINE SULFATE (PF) 4 MG/ML IV SOLN
4.0000 mg | Freq: Once | INTRAVENOUS | Status: AC
  Administered 2017-03-21: 4 mg via INTRAVENOUS
  Filled 2017-03-21: qty 1

## 2017-03-21 MED ORDER — IOPAMIDOL (ISOVUE-300) INJECTION 61%
INTRAVENOUS | Status: AC
  Filled 2017-03-21: qty 100

## 2017-03-21 MED ORDER — VENLAFAXINE HCL 75 MG PO TABS
75.0000 mg | ORAL_TABLET | Freq: Three times a day (TID) | ORAL | Status: DC
  Administered 2017-03-21 – 2017-03-25 (×10): 75 mg via ORAL
  Filled 2017-03-21 (×12): qty 1

## 2017-03-21 MED ORDER — SODIUM CHLORIDE 0.9 % IV BOLUS (SEPSIS)
500.0000 mL | Freq: Once | INTRAVENOUS | Status: AC
  Administered 2017-03-21: 500 mL via INTRAVENOUS

## 2017-03-21 MED ORDER — HEPARIN SODIUM (PORCINE) 5000 UNIT/ML IJ SOLN
5000.0000 [IU] | Freq: Three times a day (TID) | INTRAMUSCULAR | Status: DC
  Administered 2017-03-21 – 2017-03-27 (×16): 5000 [IU] via SUBCUTANEOUS
  Filled 2017-03-21 (×16): qty 1

## 2017-03-21 MED ORDER — ASPIRIN EC 81 MG PO TBEC
81.0000 mg | DELAYED_RELEASE_TABLET | Freq: Every day | ORAL | Status: DC
  Administered 2017-03-21 – 2017-03-30 (×10): 81 mg via ORAL
  Filled 2017-03-21 (×10): qty 1

## 2017-03-21 MED ORDER — ONDANSETRON HCL 4 MG PO TABS: 4.0000 mg | ORAL_TABLET | Freq: Four times a day (QID) | ORAL | Status: DC | PRN

## 2017-03-21 MED ORDER — GABAPENTIN 300 MG PO CAPS
600.0000 mg | ORAL_CAPSULE | Freq: Two times a day (BID) | ORAL | Status: DC
  Administered 2017-03-21 – 2017-03-30 (×19): 600 mg via ORAL
  Filled 2017-03-21 (×19): qty 2

## 2017-03-21 MED ORDER — ADULT MULTIVITAMIN W/MINERALS CH
1.0000 | ORAL_TABLET | Freq: Every day | ORAL | Status: DC
  Administered 2017-03-21 – 2017-03-30 (×10): 1 via ORAL
  Filled 2017-03-21 (×10): qty 1

## 2017-03-21 MED ORDER — PROPRANOLOL HCL 20 MG PO TABS
20.0000 mg | ORAL_TABLET | Freq: Two times a day (BID) | ORAL | Status: DC
  Administered 2017-03-21 – 2017-03-30 (×19): 20 mg via ORAL
  Filled 2017-03-21 (×26): qty 1

## 2017-03-21 NOTE — ED Notes (Signed)
RN AND MD NOTIFIED OF PATIENT'S LACTIC ACID LEVEL OF 2.90 

## 2017-03-21 NOTE — ED Notes (Signed)
ED TO INPATIENT HANDOFF REPORT  Name/Age/Gender Kurt Reid 67 y.o. male  Code Status    Code Status Orders  (From admission, onward)        Start     Ordered   03/15/2017 1542  Full code  Continuous     03/17/2017 1541    Code Status History    Date Active Date Inactive Code Status Order ID Comments User Context   12/10/2013 06:15 12/12/2013 17:59 Full Code [id number]  Roseanne Kaufman, MD Inpatient      Home/SNF/Other Home  Chief Complaint left hip pain  Level of Care/Admitting Diagnosis ED Disposition    ED Disposition Condition [place]: [facility] [[id number]]  Level of Care: Telemetry [5]  Admit to tele based on following criteria: Other see comments  Comments: AMS  Diagnosis: Altered mental status [780.97.ICD-9-CM]  Admitting Physician: Morrison Old  Attending Physician: Morrison Old  Estimated length of stay: 3 - 4 days  Certification:: I certify this patient will need inpatient services for at least 2 midnights  Bed request comments: tele  PT Class (Do Not Modify): Inpatient [101]  PT Acc Code (Do Not Modify): Private [1]       Medical History Past Medical History:  Diagnosis Date  . ADHD 09/23/2006  . ADHD (attention deficit hyperactivity disorder)   . Allergic rhinitis   . ALLERGIC RHINITIS 09/23/2006  . Anxiety   . ANXIETY 04/19/2009  . Benign prostatic hypertrophy   . BENIGN PROSTATIC HYPERTROPHY 11/02/2007  . Bipolar 1 disorder ([place])   . BIPOLAR AFFECTIVE DISORDER 09/23/2006  . Chronic pain syndrome 01/18/2009  . 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 Depression   . DEPRESSION 09/23/2006  . Diabetes mellitus without complication ([place])   . Diverticulosis of colon   . DIVERTICULOSIS, COLON 04/19/2009  . ESOPHAGEAL VARICES 04/19/2009  . ETOH abuse    hx  . GERD 09/23/2006  . GERD (gastroesophageal reflux disease)   . Hepatitis C   . HEPATITIS C 09/23/2006  .  Hyperlipidemia   . HYPERLIPIDEMIA 09/23/2006  . INSOMNIA-SLEEP DISORDER-UNSPEC 04/19/2009  . Low back pain    chronic  . LOW BACK PAIN 09/23/2006  . Migraine   . MRSA (methicillin resistant staph aureus) culture positive   . Nephrolithiasis    hx  . NEPHROLITHIASIS, HX OF 11/02/2007  . SEIZURE DISORDER 09/23/2006  . Seizure disorder ([employer])    H/O  . Sinusitis    recurrent    Allergies Allergies  Allergen Reactions  . Duloxetine     REACTION: memory dysfunction, dizziness  . Latex Other (See Comments)    Reaction unknown  . Levofloxacin Nausea Only  . Mercury Other (See Comments)    Reaction unknown  . Sulfonamide Derivatives Other (See Comments)    Reaction unknown    IV Location/Drains/Wounds Patient Lines/Drains/Airways Status   Active Line/Drains/Airways    Name:   Placement date:   Placement time:   Site:   Days:   Peripheral IV 03/16/2017 Right Forearm   03/19/2017    1143    Forearm   less than 1   Incision (Closed) 12/10/13 Hand Right   12/10/13    0531     1197          Labs/Imaging Results for orders placed or performed during the hospital encounter of 03/15/2017 (from the past 48 hour(s))  CBC with Differential/Platelet     Status: Abnormal  Collection Time: 03/20/2017 11:31 AM  Result Value Ref Range   WBC 30.2 (H) 4.0 - 10.5 K/uL   RBC 4.37 4.22 - 5.81 MIL/uL   Hemoglobin 13.6 13.0 - 17.0 g/dL   HCT 38.6 (L) 39.0 - 52.0 %   MCV 88.3 78.0 - 100.0 fL   MCH 31.1 26.0 - 34.0 pg   MCHC 35.2 30.0 - 36.0 g/dL   RDW 14.7 11.5 - 15.5 %   Platelets 208 150 - 400 K/uL   Neutrophils Relative % 90 %   Lymphocytes Relative 7 %   Monocytes Relative 3 %   Eosinophils Relative 0 %   Basophils Relative 0 %   Neutro Abs 27.2 (H) 1.7 - 7.7 K/uL   Lymphs Abs 2.1 0.7 - 4.0 K/uL   Monocytes Absolute 0.9 0.1 - 1.0 K/uL   Eosinophils Absolute 0.0 0.0 - 0.7 K/uL   Basophils Absolute 0.0 0.0 - 0.1 K/uL   WBC Morphology MILD LEFT SHIFT (1-5% METAS, OCC MYELO, OCC BANDS)      Comment: DOHLE BODIES  Comprehensive metabolic panel     Status: Abnormal   Collection Time: 03/16/2017 11:31 AM  Result Value Ref Range   Sodium 126 (L) 135 - 145 mmol/L   Potassium 4.4 3.5 - 5.1 mmol/L   Chloride 94 (L) 101 - 111 mmol/L   CO2 22 22 - 32 mmol/L   Glucose, Bld 320 (H) 65 - 99 mg/dL   BUN 24 (H) 6 - 20 mg/dL   Creatinine, Ser 1.15 0.61 - 1.24 mg/dL   Calcium 8.3 (L) 8.9 - 10.3 mg/dL   Total Protein 6.9 6.5 - 8.1 g/dL   Albumin 2.4 (L) 3.5 - 5.0 g/dL   AST 53 (H) 15 - 41 U/L   ALT 28 17 - 63 U/L   Alkaline Phosphatase 180 (H) 38 - 126 U/L   Total Bilirubin 4.4 (H) 0.3 - 1.2 mg/dL   GFR calc non Af Amer >60 >60 mL/min   GFR calc Af Amer >60 >60 mL/min    Comment: (NOTE) The eGFR has been calculated using the CKD EPI equation. This calculation has not been validated in all clinical situations. eGFR's persistently <60 mL/min signify possible Chronic Kidney Disease.    Anion gap 10 5 - 15  Lipase, blood     Status: None   Collection Time: 03/19/2017 11:31 AM  Result Value Ref Range   Lipase 30 11 - 51 U/L  Ammonia     Status: Abnormal   Collection Time: 04/02/2017 11:32 AM  Result Value Ref Range   Ammonia 37 (H) 9 - 35 umol/L  I-Stat CG4 Lactic Acid, ED     Status: Abnormal   Collection Time: 03/11/2017  1:13 PM  Result Value Ref Range   Lactic Acid, Venous 2.90 (HH) 0.5 - 1.9 mmol/L   Comment NOTIFIED PHYSICIAN   I-Stat CG4 Lactic Acid, ED     Status: Abnormal   Collection Time: 03/06/2017  3:03 PM  Result Value Ref Range   Lactic Acid, Venous 2.74 (HH) 0.5 - 1.9 mmol/L   Comment NOTIFIED PHYSICIAN   Urinalysis, Routine w reflex microscopic     Status: Abnormal   Collection Time: 03/30/2017  3:04 PM  Result Value Ref Range   Color, Urine AMBER (A) YELLOW    Comment: BIOCHEMICALS MAY BE AFFECTED BY COLOR   APPearance CLEAR CLEAR   Specific Gravity, Urine 1.018 1.005 - 1.030   pH 5.0 5.0 - 8.0   Glucose,  UA 150 (A) NEGATIVE mg/dL   Hgb urine dipstick SMALL (A)  NEGATIVE   Bilirubin Urine SMALL (A) NEGATIVE   Ketones, ur NEGATIVE NEGATIVE mg/dL   Protein, ur 30 (A) NEGATIVE mg/dL   Nitrite POSITIVE (A) NEGATIVE   Leukocytes, UA MODERATE (A) NEGATIVE   RBC / HPF 0-5 0 - 5 RBC/hpf   WBC, UA 6-30 0 - 5 WBC/hpf   Bacteria, UA FEW (A) NONE SEEN   Squamous Epithelial / LPF NONE SEEN NONE SEEN   Mucus PRESENT    Hyaline Casts, UA PRESENT    Dg Chest 2 View  Result Date: 03/11/2017 CLINICAL DATA:  Confusion. EXAM: CHEST  2 VIEW COMPARISON:  03/10/2017 FINDINGS: The cardiac silhouette, mediastinal and hilar contours are within normal limits and stable. Stable tortuosity of the thoracic aorta. The lungs demonstrate mild chronic bronchitic changes but no infiltrates, edema or effusions. IMPRESSION: No acute cardiopulmonary findings. Electronically Signed   By: Marijo Sanes M.D.   On: 03/10/2017 15:59   Dg Ankle Complete Left  Result Date: 03/10/2017 CLINICAL DATA:  Swelling, pain and stiffness. EXAM: LEFT ANKLE COMPLETE - 3+ VIEW COMPARISON:  None. FINDINGS: There is no fracture or dislocation. Slight degenerative changes at the ankle joint with a small ankle effusion and soft tissue swelling around the ankle. IMPRESSION: 1. Slight arthritic changes at the ankle joint with a small joint effusion and soft tissue swelling. 2. No fractures. Electronically Signed   By: Lorriane Shire M.D.   On: 03/17/2017 12:59   Ct Abdomen Pelvis W Contrast  Result Date: 03/20/2017 CLINICAL DATA:  67 year old male with history of abdominal pain, confusion and hip pain. Jaundice. EXAM: CT ABDOMEN AND PELVIS WITH CONTRAST TECHNIQUE: Multidetector CT imaging of the abdomen and pelvis was performed using the standard protocol following bolus administration of intravenous contrast. CONTRAST:  160m ISOVUE-300 IOPAMIDOL (ISOVUE-300) INJECTION 61% COMPARISON:  No priors. FINDINGS: Lower chest: Areas of mild cylindrical bronchiectasis are noted in the lung bases, most evident in the right  middle lobe where there is also extensive thickening of the peribronchovascular interstitium and associated peribronchovascular micro and macronodularity as well as regional architectural distortion, most compatible with areas of chronic mucoid impaction in the setting of chronic post infectious or inflammatory scarring. Hepatobiliary: Liver has a shrunken appearance and nodular contour, compatible with advanced cirrhosis. Contrast bolus is suboptimal on today's examination, and with this limitation in mind there is no discrete hypervascular lesion confidently identified. No intra or extrahepatic biliary ductal dilatation. Gallbladder is normal in appearance. Pancreas: No pancreatic mass. No pancreatic ductal dilatation. No pancreatic or peripancreatic fluid or inflammatory changes. Spleen: Spleen is enlarged measuring 18.0 x 8.4 x 17.1 cm (estimated splenic volume of 1,293 mL) . Adrenals/Urinary Tract: There are multiple nonobstructive calculi throughout the collecting systems of both kidneys measuring up to 4 mm in the interpolar collecting system of the right kidney. No additional calculi are noted along the course of either ureter or within the lumen of the urinary bladder. No hydroureteronephrosis. Mild multifocal cortical thinning in the right kidney. 5.6 cm simple cyst in the interpolar region of the left kidney. No suspicious renal lesions. Urinary bladder is normal in appearance. Bilateral adrenal glands are normal in appearance. Stomach/Bowel: Normal appearance of the stomach. No pathologic dilatation of small bowel or colon. Numerous colonic diverticulae are noted, without surrounding inflammatory changes to suggest an acute diverticulitis at this time. Normal appendix. Vascular/Lymphatic: Aortic atherosclerosis, without evidence of aneurysm or dissection in the abdominal or  pelvic vasculature. Numerous portosystemic collateral pathways, most evident for large splenorenal collaterals and small proximal  gastric varices. Portal vein remains patent at this time and is dilated measuring up to 18 mm in diameter in the porta hepatis. No lymphadenopathy. Reproductive: Prostate gland and seminal vesicles are unremarkable in appearance. Other: Small volume of ascites.  No pneumoperitoneum. Musculoskeletal: There are no aggressive appearing lytic or blastic lesions noted in the visualized portions of the skeleton. IMPRESSION: 1. Morphologic changes of advanced cirrhosis with evidence of portal hypertension, including dilated portal vein and splenomegaly with numerous portosystemic collateral pathways. Previously identified lesion in segment 8 is not confidently noted on today's examination. 2. Numerous nonobstructive calculi within the collecting systems of both kidneys. No ureteral stones or findings of urinary tract obstruction are noted at this time. 3. Aortic atherosclerosis. 4. Colonic diverticulosis without evidence of acute diverticulitis at this time. Electronically Signed   By: Vinnie Langton M.D.   On: 03/12/2017 14:08   Dg Hips Bilat W Or Wo Pelvis 3-4 Views  Result Date: 03/14/2017 CLINICAL DATA:  Suspected hip pain with decreased motility. EXAM: DG HIP (WITH OR WITHOUT PELVIS) 3-4V BILAT COMPARISON:  None. FINDINGS: There is an age-indeterminate avulsion fracture of the right greater trochanter. Otherwise, there is no evidence of fracture or dislocation. Minimal osteoarthritic changes of the left hip. IMPRESSION: Age-indeterminate avulsion fracture of the right greater trochanter. No evidence of left hip fracture. Electronically Signed   By: Fidela Salisbury M.D.   On: 03/26/2017 12:58    Pending Labs Unresulted Labs (From admission, onward)   Start     Ordered   03/22/17 0500  CBC  Tomorrow morning,   R     03/22/2017 1541   03/24/2017 1541  Sedimentation rate  Add-on,   R     03/24/2017 1540   03/22/2017 1258  Culture, blood (routine x 2)  BLOOD CULTURE X 2,   STAT     03/15/2017 1257       Vitals/Pain Today's Vitals   03/29/2017 1633 03/20/2017 1640 03/07/2017 1700 03/11/2017 1710  BP:  125/78 130/75 130/75  Pulse:  90 91 91  Resp:  20 (!) 24 (!) 22  Temp:      TempSrc:      SpO2:  94% 96% 96%  Weight:      Height:      PainSc: 6        Isolation Precautions No active isolations  Medications Medications  iopamidol (ISOVUE-300) 61 % injection (not administered)  aspirin EC tablet 81 mg (not administered)  venlafaxine XR (EFFEXOR-XR) 24 hr capsule 75 mg (not administered)  pantoprazole (PROTONIX) EC tablet 40 mg (not administered)  propranolol (INDERAL) tablet 20 mg (not administered)  gabapentin (NEURONTIN) capsule 600 mg (not administered)  sodium chloride flush (NS) 0.9 % injection 3 mL (not administered)  sodium chloride flush (NS) 0.9 % injection 3 mL (not administered)  0.9 %  sodium chloride infusion (not administered)  acetaminophen (TYLENOL) tablet 650 mg (not administered)    Or  acetaminophen (TYLENOL) suppository 650 mg (not administered)  ondansetron (ZOFRAN) tablet 4 mg (not administered)    Or  ondansetron (ZOFRAN) injection 4 mg (not administered)  albuterol (PROVENTIL) (2.5 MG/3ML) 0.083% nebulizer solution 2.5 mg (not administered)  heparin injection 5,000 Units (not administered)  0.9 %  sodium chloride infusion (not administered)  lactulose (CHRONULAC) 10 GM/15ML solution 30 g (not administered)  multivitamin with minerals tablet 1 tablet (not administered)  insulin aspart (novoLOG)  injection 0-9 Units (not administered)  morphine 4 MG/ML injection 4 mg (4 mg Intravenous Given 03/26/2017 1402)  ondansetron (ZOFRAN) injection 4 mg (4 mg Intravenous Given 03/13/2017 1402)  iopamidol (ISOVUE-300) 61 % injection 100 mL (100 mLs Intravenous Contrast Given 03/20/2017 1315)  sodium chloride 0.9 % bolus 500 mL (500 mLs Intravenous New Bag/Given 04/01/2017 1630)    Mobility walks

## 2017-03-21 NOTE — Progress Notes (Signed)
Kurt Reid is a 67 y.o. male with the following history as recorded in EpicCare:  Patient Active Problem List   Diagnosis Date Noted  . Abnormal MRI of abdomen 11/20/2016  . Skin lesion 05/22/2016  . Liver nodule 11/17/2015  . Gynecomastia 06/02/2014  . Hypotension 01/18/2014  . Dog bite of multiple sites of right hand and fingers 12/10/2013  . DM type 2 without retinopathy (HCC) 12/10/2013  . Chronic hepatitis C (HCC) 12/10/2013  . Dyslipidemia 12/10/2013  . Hemoptysis 06/05/2013  . Acquired ptosis of right eyelid 08/26/2012  . Left lumbar radiculopathy 08/09/2011  . Abdominal pain, RUQ 08/09/2011  . Abscess of abdominal wall 07/24/2011  . 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 Acute sinus infection 04/06/2011  . ADHD (attention deficit hyperactivity disorder) 04/06/2011  . Hepatic cirrhosis due to chronic hepatitis C infection (HCC) 11/23/2010  . Encounter for well adult exam with abnormal findings 07/23/2010  . Anxiety state 04/19/2009  . ESOPHAGEAL VARICES 04/19/2009  . DIVERTICULOSIS, COLON 04/19/2009  . INSOMNIA-SLEEP DISORDER-UNSPEC 04/19/2009  . Chronic pain syndrome 01/18/2009  . BENIGN PROSTATIC HYPERTROPHY 11/02/2007  . FATIGUE 11/02/2007  . NEPHROLITHIASIS, HX OF 11/02/2007  . BIPOLAR AFFECTIVE DISORDER 09/23/2006  . Depression 09/23/2006  . Allergic rhinitis 09/23/2006  . GERD 09/23/2006  . LOW BACK PAIN 09/23/2006  . Convulsions (HCC) 09/23/2006    Current Outpatient Medications  Medication Sig Dispense Refill  . aspirin (ASPIRIN EC) 81 MG EC tablet Take 81 mg by mouth daily.      . B-D UF III MINI PEN NEEDLES 31G X 5 MM MISC USE AS DIRECTED ONCE DAILY 100 each 11  . fluticasone (FLONASE) 50 MCG/ACT nasal spray PLACE 2 SPRAYS INTO BOTH NOSTRILS DAILY. 16 g 3  . gabapentin (NEURONTIN) 300 MG capsule TAKE 2 CAPSULES BY MOUTH TWICE DAILY 180 capsule 0  . glucose blood (ONE TOUCH ULTRA TEST) test strip USE AS DIRECTED THREE  TIMES DAILY 300 each 1  . HYDROcodone-acetaminophen (NORCO) 10-325 MG per tablet Take 1 tablet by mouth 2 (two) times daily as needed for severe pain.     . Insulin NPH Isophane & Regular (RELION 70/30 [place]) Inject into the skin. Sliding scale Dr. Lilli LightBalaen    . lactulose (CHRONULAC) 10 GM/15ML solution Take 10 g by mouth daily as needed for mild constipation or moderate constipation.    . Lancets Misc. (ONE TOUCH SURESOFT) MISC Use as directed once per day 100 each 11  . metFORMIN (GLUCOPHAGE-XR) 500 MG 24 hr tablet Take 2 tablets (1,000 mg total) by mouth daily with breakfast. 180 tablet 3  . omeprazole (PRILOSEC) 20 MG capsule Take 1 capsule (20 mg total) by mouth daily. 30 capsule 11  . propranolol (INDERAL) 20 MG tablet TAKE 1 TABLET BY MOUTH 2 TIMES DAILY 60 tablet 10  . RELION INSULIN SYRINGE 1ML/31G 31G X 5/16" 1 ML MISC   4  . spironolactone (ALDACTONE) 50 MG tablet Take 50 mg by mouth 1 day or 1 dose.  4  . triamcinolone (NASACORT AQ) 55 MCG/ACT AERO nasal inhaler Place 2 sprays into the nose daily. 1 Inhaler 12  . venlafaxine (EFFEXOR) 75 MG tablet TAKE 1 TABLET BY MOUTH 3 TIMES DAILY WITH MEALS 270 tablet 3  . chlorhexidine (HIBICLENS) 4 % external liquid Apply topically as directed. Bathe 3x/week. Avoid face and genitalia (Patient not taking: Reported on 01-11-2018) 120 mL 0  . desvenlafaxine (PRISTIQ) 50 MG 24 hr tablet Take 1 tablet (50 mg total)  by mouth daily. (Patient not taking: Reported on 04/02/2017) 90 tablet 3   No current facility-administered medications for this visit.     Allergies: Duloxetine; Latex; Levofloxacin; Mercury; and Sulfonamide derivatives  Past Medical History:  Diagnosis Date  . ADHD 09/23/2006  . ADHD (attention deficit hyperactivity disorder)   . Allergic rhinitis   . ALLERGIC RHINITIS 09/23/2006  . Anxiety   . ANXIETY 04/19/2009  . Benign prostatic hypertrophy   . BENIGN PROSTATIC HYPERTROPHY 11/02/2007  . Bipolar 1 disorder (HCC)   . BIPOLAR AFFECTIVE  DISORDER 09/23/2006  . Chronic pain syndrome 01/18/2009  . 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 Depression   . DEPRESSION 09/23/2006  . Diabetes mellitus without complication (HCC)   . Diverticulosis of colon   . DIVERTICULOSIS, COLON 04/19/2009  . ESOPHAGEAL VARICES 04/19/2009  . ETOH abuse    hx  . GERD 09/23/2006  . GERD (gastroesophageal reflux disease)   . Hepatitis C   . HEPATITIS C 09/23/2006  . Hyperlipidemia   . HYPERLIPIDEMIA 09/23/2006  . INSOMNIA-SLEEP DISORDER-UNSPEC 04/19/2009  . Low back pain    chronic  . LOW BACK PAIN 09/23/2006  . Migraine   . MRSA (methicillin resistant staph aureus) culture positive   . Nephrolithiasis    hx  . NEPHROLITHIASIS, HX OF 11/02/2007  . SEIZURE DISORDER 09/23/2006  . Seizure disorder (HCC)    H/O  . Sinusitis    recurrent    Past Surgical History:  Procedure Laterality Date  . back surgury     x 5  . cheekbone     hx of fx  . I&D EXTREMITY Right 12/10/2013   Procedure: IRRIGATION AND DEBRIDEMENT OF MULTIPLE DOG BITES RIGHT HAND AND WRIST;  Surgeon: Dominica Severin, MD;  Location: [facility] OR;  Service: Orthopedics;  Laterality: Right;  . knee surgury     x 1  . s/p sinus surgury  2010   Dr. Zara Chess  . TONSILLECTOMY      Family History  Problem Relation Age of Onset  . Dementia Father   . Stroke Mother   . Hyperlipidemia Mother   . Depression Mother   . Hypertension Mother   . Asthma Daughter   . Diabetes Neg Hx     Social History   Tobacco Use  . Smoking status: Former Games developer  . Smokeless tobacco: Never Used  Substance Use Topics  . Alcohol use: No    Subjective:  Patient presents with his wife today; known history of Stage 4 Cirrhosis; history of Hepatitis C- this has been cured but cirrhosis was present at time of diagnosis; Hep C treatment was completed at [facility] and patient has continued care with [facility]; wife notes that she took husband to ER at [facility] last Friday night with concerns  for RUQ pain/ chest pain; gallbladder ultrasound was normal, labs were "normal" and HIDA Scan is scheduled for Monday; she notes that in the past few days, he has started to deteriorate very quickly; marked confusion x 2 days, jaundice x 1 day, difficulty walking x 2 days, not eating; she knows he has not been taking his lactulose and has not been taking his insulin;   Objective:  Vitals:   03/29/2017 1022  BP: 122/72  Pulse: 91  Temp: 99.5 F (37.5 C)  TempSrc: Oral  SpO2: 98%  Weight: 228 lb 1.9 oz (103.5 kg)  Height: 6\' 1"  (1.854 m)    General: Well developed, well nourished, in mild  distress  Skin : Warm and dry. Jaundice Head: Normocephalic and atraumatic  Neurologic: Disoriented; speech intact; face symmetrical; in wheelchair due to difficulty walking Limited physical exam done here- based on patient's appearance, will definitely need ER evaluation and admission   Assessment:  1. Jaundice   2. Hepatic cirrhosis, unspecified hepatic cirrhosis type, unspecified whether ascites present (HCC)   3. Confusion     Plan:  Known Stage 4 cirrhosis; refer to [facility] ER for evaluation and admission; nurse at ER is notified; wife agrees to plan and prefers to take patient by private vehicle.   No Follow-up on file.  No orders of the defined types were placed in this encounter.   Requested Prescriptions    No prescriptions requested or ordered in this encounter

## 2017-03-21 NOTE — ED Notes (Signed)
Nurse requested that I wait 20 mins before bringing patient up because she is getting 2 patients from ED right now and doesn't want both at same time

## 2017-03-21 NOTE — H&P (Signed)
Patient Demographics:    Kurt Reid, is a 67 y.o. male  MRN: [id number]   DOB - 06-06-1950  Admit Date - 03/04/2017  Outpatient Primary MD for the patient is [name], MD   Assessment & Plan:    Principal Problem:   Altered mental status Active Problems:   BIPOLAR AFFECTIVE DISORDER   Chronic pain syndrome   Esophageal varices (HCC)   Hepatic cirrhosis due to chronic hepatitis C infection (HCC)   DM type 2 without retinopathy (HCC)   Chronic hepatitis C (HCC)   Leukocytosis   Sepsis (HCC)   UTI (urinary tract infection)    1)Altered mental status-significant leukocytosis noted, white count is up to 30,000 from 11,000 on 03/14/2017, possible etiologies include hepatic encephalopathy in the setting of noncompliance with lactulose in a patient with liver cirrhosis secondary to hep C, metabolic encephalopathy in the setting of hyponatremia, other possible etiologies include UTI;  spontaneous bacterial peritonitis or CNS infection deemed to be less likely-Curbside consultation with Dr Drue Second, will treat empirically with IV Rocephin for presumed UTI pending culture data, treat empirically with lactulose 30 g 3 times daily to achieve 2-3 bowel movements each day.  Please note that serum ammonia was not particularly elevated at this time, CT abdomen and pelvis without acute findings, patient has small volume ascites, UA suspicious for UTI, chest x-ray without acute cardiopulmonary findings  2)Possible UTI- iv Rocephin as above #1 pending culture data  3)Liver Cirrhosis with portal hypertension-despite noted ammonia levels suspect clinically the patient has some degree of hepatic encephalopathy , restart lactulose as noted above #1, continue propranolol 20 mg twice daily  4)Social/Ethics-discussed with patient's  wife at bedside, patient is a full code  5)Bil Hip Pain-significant left hip pain, x-rays without fractures, patient does have an old avulsion fracture of the right hip.  Right hip is neither painful and nontender at this time, get physical therapy eval, be judicious with opiates given altered mentation, avoid excessive acetaminophen given liver cirrhosis  With History of - Reviewed by me  Past Medical History:  Diagnosis Date  . ADHD 09/23/2006  . ADHD (attention deficit hyperactivity disorder)   . Allergic rhinitis   . ALLERGIC RHINITIS 09/23/2006  . Anxiety   . ANXIETY 04/19/2009  . Benign prostatic hypertrophy   . BENIGN PROSTATIC HYPERTROPHY 11/02/2007  . Bipolar 1 disorder (HCC)   . BIPOLAR AFFECTIVE DISORDER 09/23/2006  . Chronic pain syndrome 01/18/2009  . 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 Depression   . DEPRESSION 09/23/2006  . Diabetes mellitus without complication (HCC)   . Diverticulosis of colon   . DIVERTICULOSIS, COLON 04/19/2009  . ESOPHAGEAL VARICES 04/19/2009  . ETOH abuse    hx  . GERD 09/23/2006  . GERD (gastroesophageal reflux disease)   . Hepatitis C   . HEPATITIS C 09/23/2006  . Hyperlipidemia   . HYPERLIPIDEMIA 09/23/2006  . INSOMNIA-SLEEP DISORDER-UNSPEC 04/19/2009  .  Low back pain    chronic  . LOW BACK PAIN 09/23/2006  . Migraine   . MRSA (methicillin resistant staph aureus) culture positive   . Nephrolithiasis    hx  . NEPHROLITHIASIS, HX OF 11/02/2007  . SEIZURE DISORDER 09/23/2006  . Seizure disorder (HCC)    H/O  . Sinusitis    recurrent      Past Surgical History:  Procedure Laterality Date  . back surgury     x 5  . cheekbone     hx of fx  . I&D EXTREMITY Right 12/10/2013   Procedure: IRRIGATION AND DEBRIDEMENT OF MULTIPLE DOG BITES RIGHT HAND AND WRIST;  Surgeon: Dominica Severin, MD;  Location: [facility] OR;  Service: Orthopedics;  Laterality: Right;  . knee surgury     x 1  . s/p sinus surgury  2010   Dr. Zara Chess  .  TONSILLECTOMY        Chief Complaint  Patient presents with  . Hip Pain  . Abdominal Pain  . Altered Mental Status  . Jaundice      HPI:    Kurt Reid  is a 67 y.o. male with h/o liver cirrhosis secondary to hepatitis C, diabetes, bipolar disorder,  sent here by PCP for evaluation of abdominal pain and confusion.  Patient is a poor historian, history obtained through wife who is at bedside  According to patient's wife has had intermittent abdominal pain especially in the right upper quadrant for the last week or so, patient was seen at [facility] workup including gallbladder ultrasound and HIDA scan did not reveal any acute pathology.  No fevers/chills, no emesis or diarrhea ,    Please note that serum ammonia was not particularly elevated at this time, white count is up to 30,000 from 11,000 on 03/14/2017. CT abdomen and pelvis without acute findings, patient has small volume ascites, UA suspicious for UTI, chest x-ray without acute cardiopulmonary findings  Hyponatremia noted most likely due to underlying liver Cirrhosis     Review of systems:    In addition to the HPI above,   A full 12 point Review of 10 Systems was done, except as stated above, all other Review of 10 Systems were negative.    Social History:  Reviewed by me    Social History   Tobacco Use  . Smoking status: Former Games developer  . Smokeless tobacco: Never Used  Substance Use Topics  . Alcohol use: No       Family History :  Reviewed by me    Family History  Problem Relation Age of Onset  . Dementia Father   . Stroke Mother   . Hyperlipidemia Mother   . Depression Mother   . Hypertension Mother   . Asthma Daughter   . Diabetes Neg Hx     Home Medications:   Prior to Admission medications   Medication Sig Start Date End Date Taking? Authorizing Provider  aspirin (ASPIRIN EC) 81 MG EC tablet Take 81 mg by mouth daily.     Yes [provider]  gabapentin (NEURONTIN) 300  MG capsule TAKE 2 CAPSULES BY MOUTH TWICE DAILY 03/05/17  Yes [name], MD  HYDROcodone-acetaminophen [facility]) 10-325 MG per tablet Take 1 tablet by mouth 2 (two) times daily as needed for severe pain.    Yes [provider]  Insulin NPH Isophane & Regular (RELION 70/30 [place]) Inject into the skin. Sliding scale Dr. Lilli Light   Yes [provider]  omeprazole (  PRILOSEC) 20 MG capsule Take 1 capsule (20 mg total) by mouth daily. 06/11/16  Yes [name], MD  propranolol (INDERAL) 20 MG tablet TAKE 1 TABLET BY MOUTH 2 TIMES DAILY 07/15/16  Yes [name], MD  spironolactone (ALDACTONE) 50 MG tablet Take 50 mg by mouth 1 day or 1 dose. 07/15/16  Yes [provider]  venlafaxine (EFFEXOR) 75 MG tablet TAKE 1 TABLET BY MOUTH 3 TIMES DAILY WITH MEALS 07/15/16  Yes [name], MD  B-D UF III MINI PEN NEEDLES 31G X 5 MM MISC USE AS DIRECTED ONCE DAILY    [name], MD  chlorhexidine (HIBICLENS) 4 % external liquid Apply topically as directed. Bathe 3x/week. Avoid face and genitalia Patient not taking: Reported on 04-15-2017 07/23/16   Nche, Bonna Gains, NP  desvenlafaxine (PRISTIQ) 50 MG 24 hr tablet Take 1 tablet (50 mg total) by mouth daily. Patient not taking: Reported on 04/15/2017 05/22/16   [name], MD  fluticasone [facility]) 50 MCG/ACT nasal spray PLACE 2 SPRAYS INTO BOTH NOSTRILS DAILY. Patient not taking: Reported on 04/15/2017 02/13/15   [name], MD  glucose blood (ONE TOUCH ULTRA TEST) test strip USE AS DIRECTED THREE TIMES DAILY 04/27/15   [name], MD  Lancets Misc. (ONE [facility] SURESOFT) MISC Use as directed once per day 11/10/12   [name], MD  metFORMIN (GLUCOPHAGE-XR) 500 MG 24 hr tablet Take 2 tablets (1,000 mg total) by mouth daily with breakfast. Patient not taking: Reported on 2017-04-15 05/24/16   [name], MD  RELION INSULIN SYRINGE 1ML/31G 31G X 5/16" 1 ML MISC  05/31/16   [provider]  triamcinolone (NASACORT AQ) 55  MCG/ACT AERO nasal inhaler Place 2 sprays into the nose daily. Patient not taking: Reported on 2017-04-15 05/22/16   [name], MD     Allergies:     Allergies  Allergen Reactions  . Duloxetine     REACTION: memory dysfunction, dizziness  . Latex Other (See Comments)    Reaction unknown  . Levofloxacin Nausea Only  . Mercury Other (See Comments)    Reaction unknown  . Sulfonamide Derivatives Other (See Comments)    Reaction unknown     Physical Exam:   Vitals  Blood pressure 119/66, pulse 93, temperature 99.5 F (37.5 C), temperature source Rectal, resp. rate (!) 22, height 6\' 1"  (1.854 m), weight 103.4 kg (228 lb), SpO2 95 %.  Physical Examination: General appearance - alert, well appearing, and in no distress  Mental status -confused, disoriented, no excessive lethargy Eyes - sclera anicteric Neck - supple, no JVD elevation , Chest - clear  to auscultation bilaterally, symmetrical air movement,  Heart - S1 and S2 normal,  Abdomen - soft, nontender, slightly distended,  no CVA area tenderness Neurological -  neck supple without rigidity, cranial nerves II through XII intact, DTR's normal and symmetric, generalized weakness without new focal deficits, no significant tremors Extremities - no pedal edema noted, intact peripheral pulses , Lt Hip is tender with palpation and ROM, overlying skin without erythema, swelling or warmth .  Right hip area is not tender Skin - warm, dry Psych-confused, disoriented   Data Review:    CBC Recent Labs  Lab 2017/04/15 1131  WBC 30.2*  HGB 13.6  HCT 38.6*  PLT 208  MCV 88.3  MCH 31.1  MCHC 35.2  RDW 14.7  LYMPHSABS 2.1  MONOABS 0.9  EOSABS 0.0  BASOSABS 0.0   ------------------------------------------------------------------------------------------------------------------  Chemistries  Recent Labs  Lab 03/29/2017 1131  NA 126*  K 4.4  CL 94*  CO2 22  GLUCOSE 320*  BUN 24*  CREATININE 1.15  CALCIUM 8.3*  AST 53*    ALT 28  ALKPHOS 180*  BILITOT 4.4*   ------------------------------------------------------------------------------------------------------------------ estimated creatinine clearance is 78.7 mL/min (by C-G formula based on SCr of 1.15 mg/dL). ------------------------------------------------------------------------------------------------------------------ No results for input(s): TSH, T4TOTAL, T3FREE, THYROIDAB in the last 72 hours.  Invalid input(s): FREET3   Coagulation profile No results for input(s): INR, PROTIME in the last 168 hours. ------------------------------------------------------------------------------------------------------------------- No results for input(s): DDIMER in the last 72 hours. -------------------------------------------------------------------------------------------------------------------  Cardiac Enzymes No results for input(s): CKMB, TROPONINI, MYOGLOBIN in the last 168 hours.  Invalid input(s): CK ------------------------------------------------------------------------------------------------------------------ No results found for: BNP   ---------------------------------------------------------------------------------------------------------------  Urinalysis    Component Value Date/Time   COLORURINE AMBER (A) 03/28/2017 1504   APPEARANCEUR CLEAR 03/08/2017 1504   LABSPEC 1.018 03/04/2017 1504   PHURINE 5.0 03/24/2017 1504   GLUCOSEU 150 (A) 03/04/2017 1504   GLUCOSEU 500 (A) 10/27/2014 1429   HGBUR SMALL (A) 04/01/2017 1504   BILIRUBINUR SMALL (A) 03/17/2017 1504   KETONESUR NEGATIVE 03/19/2017 1504   PROTEINUR 30 (A) 03/07/2017 1504   UROBILINOGEN 1.0 10/27/2014 1429   NITRITE POSITIVE (A) 03/05/2017 1504   LEUKOCYTESUR MODERATE (A) 03/20/2017 1504    ----------------------------------------------------------------------------------------------------------------   Imaging Results:    Dg Chest 2 View  Result Date:  03/23/2017 CLINICAL DATA:  Confusion. EXAM: CHEST  2 VIEW COMPARISON:  03/10/2017 FINDINGS: The cardiac silhouette, mediastinal and hilar contours are within normal limits and stable. Stable tortuosity of the thoracic aorta. The lungs demonstrate mild chronic bronchitic changes but no infiltrates, edema or effusions. IMPRESSION: No acute cardiopulmonary findings. Electronically Signed   By: Rudie Meyer M.D.   On: 04/01/2017 15:59   Dg Ankle Complete Left  Result Date: 03/22/2017 CLINICAL DATA:  Swelling, pain and stiffness. EXAM: LEFT ANKLE COMPLETE - 3+ VIEW COMPARISON:  None. FINDINGS: There is no fracture or dislocation. Slight degenerative changes at the ankle joint with a small ankle effusion and soft tissue swelling around the ankle. IMPRESSION: 1. Slight arthritic changes at the ankle joint with a small joint effusion and soft tissue swelling. 2. No fractures. Electronically Signed   By: Francene Boyers M.D.   On: 03/25/2017 12:59   Ct Abdomen Pelvis W Contrast  Result Date: 03/29/2017 CLINICAL DATA:  67 year old male with history of abdominal pain, confusion and hip pain. Jaundice. EXAM: CT ABDOMEN AND PELVIS WITH CONTRAST TECHNIQUE: Multidetector CT imaging of the abdomen and pelvis was performed using the standard protocol following bolus administration of intravenous contrast. CONTRAST:  ISOVUE-300 IOPAMIDOL (ISOVUE-300) INJECTION 61% COMPARISON:  No priors. FINDINGS: Lower chest: Areas of mild cylindrical bronchiectasis are noted in the lung bases, most evident in the right middle lobe where there is also extensive thickening of the peribronchovascular interstitium and associated peribronchovascular micro and macronodularity as well as regional architectural distortion, most compatible with areas of chronic mucoid impaction in the setting of chronic post infectious or inflammatory scarring. Hepatobiliary: Liver has a shrunken appearance and nodular contour, compatible with advanced  cirrhosis. Contrast bolus is suboptimal on today's examination, and with this limitation in mind there is no discrete hypervascular lesion confidently identified. No intra or extrahepatic biliary ductal dilatation. Gallbladder is normal in appearance. Pancreas: No pancreatic mass. No pancreatic ductal dilatation. No pancreatic or peripancreatic fluid or inflammatory changes. Spleen: Spleen is enlarged measuring 18.0 x  8.4 x 17.1 cm (estimated splenic volume of 1,293 mL) . Adrenals/Urinary Tract: There are multiple nonobstructive calculi throughout the collecting systems of both kidneys measuring up to 4 mm in the interpolar collecting system of the right kidney. No additional calculi are noted along the course of either ureter or within the lumen of the urinary bladder. No hydroureteronephrosis. Mild multifocal cortical thinning in the right kidney. 5.6 cm simple cyst in the interpolar region of the left kidney. No suspicious renal lesions. Urinary bladder is normal in appearance. Bilateral adrenal glands are normal in appearance. Stomach/Bowel: Normal appearance of the stomach. No pathologic dilatation of small bowel or colon. Numerous colonic diverticulae are noted, without surrounding inflammatory changes to suggest an acute diverticulitis at this time. Normal appendix. Vascular/Lymphatic: Aortic atherosclerosis, without evidence of aneurysm or dissection in the abdominal or pelvic vasculature. Numerous portosystemic collateral pathways, most evident for large splenorenal collaterals and small proximal gastric varices. Portal vein remains patent at this time and is dilated measuring up to 18 mm in diameter in the porta hepatis. No lymphadenopathy. Reproductive: Prostate gland and seminal vesicles are unremarkable in appearance. Other: Small volume of ascites.  No pneumoperitoneum. Musculoskeletal: There are no aggressive appearing lytic or blastic lesions noted in the visualized portions of the skeleton.  IMPRESSION: 1. Morphologic changes of advanced cirrhosis with evidence of portal hypertension, including dilated portal vein and splenomegaly with numerous portosystemic collateral pathways. Previously identified lesion in segment 8 is not confidently noted on today's examination. 2. Numerous nonobstructive calculi within the collecting systems of both kidneys. No ureteral stones or findings of urinary tract obstruction are noted at this time. 3. Aortic atherosclerosis. 4. Colonic diverticulosis without evidence of acute diverticulitis at this time. Electronically Signed   By: Trudie Reedaniel  Entrikin M.D.   On: 03/07/2017 14:08   Dg Hips Bilat W Or Wo Pelvis 3-4 Views  Result Date: 03/24/2017 CLINICAL DATA:  Suspected hip pain with decreased motility. EXAM: DG HIP (WITH OR WITHOUT PELVIS) 3-4V BILAT COMPARISON:  None. FINDINGS: There is an age-indeterminate avulsion fracture of the right greater trochanter. Otherwise, there is no evidence of fracture or dislocation. Minimal osteoarthritic changes of the left hip. IMPRESSION: Age-indeterminate avulsion fracture of the right greater trochanter. No evidence of left hip fracture. Electronically Signed   By: Ted Mcalpineobrinka  Dimitrova M.D.   On: 03/11/2017 12:58    Radiological Exams on Admission: Dg Chest 2 View  Result Date: 03/30/2017 CLINICAL DATA:  Confusion. EXAM: CHEST  2 VIEW COMPARISON:  03/10/2017 FINDINGS: The cardiac silhouette, mediastinal and hilar contours are within normal limits and stable. Stable tortuosity of the thoracic aorta. The lungs demonstrate mild chronic bronchitic changes but no infiltrates, edema or effusions. IMPRESSION: No acute cardiopulmonary findings. Electronically Signed   By: Rudie MeyerP.  Gallerani M.D.   On: 03/12/2017 15:59   Dg Ankle Complete Left  Result Date: 03/08/2017 CLINICAL DATA:  Swelling, pain and stiffness. EXAM: LEFT ANKLE COMPLETE - 3+ VIEW COMPARISON:  None. FINDINGS: There is no fracture or dislocation. Slight degenerative  changes at the ankle joint with a small ankle effusion and soft tissue swelling around the ankle. IMPRESSION: 1. Slight arthritic changes at the ankle joint with a small joint effusion and soft tissue swelling. 2. No fractures. Electronically Signed   By: Francene BoyersJames  Maxwell M.D.   On: 03/22/2017 12:59   Ct Abdomen Pelvis W Contrast  Result Date: 03/24/2017 CLINICAL DATA:  67 year old male with history of abdominal pain, confusion and hip pain. Jaundice. EXAM: CT ABDOMEN AND  PELVIS WITH CONTRAST TECHNIQUE: Multidetector CT imaging of the abdomen and pelvis was performed using the standard protocol following bolus administration of intravenous contrast. CONTRAST:  ISOVUE-300 IOPAMIDOL (ISOVUE-300) INJECTION 61% COMPARISON:  No priors. FINDINGS: Lower chest: Areas of mild cylindrical bronchiectasis are noted in the lung bases, most evident in the right middle lobe where there is also extensive thickening of the peribronchovascular interstitium and associated peribronchovascular micro and macronodularity as well as regional architectural distortion, most compatible with areas of chronic mucoid impaction in the setting of chronic post infectious or inflammatory scarring. Hepatobiliary: Liver has a shrunken appearance and nodular contour, compatible with advanced cirrhosis. Contrast bolus is suboptimal on today's examination, and with this limitation in mind there is no discrete hypervascular lesion confidently identified. No intra or extrahepatic biliary ductal dilatation. Gallbladder is normal in appearance. Pancreas: No pancreatic mass. No pancreatic ductal dilatation. No pancreatic or peripancreatic fluid or inflammatory changes. Spleen: Spleen is enlarged measuring 18.0 x 8.4 x 17.1 cm (estimated splenic volume of 1,293 mL) . Adrenals/Urinary Tract: There are multiple nonobstructive calculi throughout the collecting systems of both kidneys measuring up to 4 mm in the interpolar collecting system of the right  kidney. No additional calculi are noted along the course of either ureter or within the lumen of the urinary bladder. No hydroureteronephrosis. Mild multifocal cortical thinning in the right kidney. 5.6 cm simple cyst in the interpolar region of the left kidney. No suspicious renal lesions. Urinary bladder is normal in appearance. Bilateral adrenal glands are normal in appearance. Stomach/Bowel: Normal appearance of the stomach. No pathologic dilatation of small bowel or colon. Numerous colonic diverticulae are noted, without surrounding inflammatory changes to suggest an acute diverticulitis at this time. Normal appendix. Vascular/Lymphatic: Aortic atherosclerosis, without evidence of aneurysm or dissection in the abdominal or pelvic vasculature. Numerous portosystemic collateral pathways, most evident for large splenorenal collaterals and small proximal gastric varices. Portal vein remains patent at this time and is dilated measuring up to 18 mm in diameter in the porta hepatis. No lymphadenopathy. Reproductive: Prostate gland and seminal vesicles are unremarkable in appearance. Other: Small volume of ascites.  No pneumoperitoneum. Musculoskeletal: There are no aggressive appearing lytic or blastic lesions noted in the visualized portions of the skeleton. IMPRESSION: 1. Morphologic changes of advanced cirrhosis with evidence of portal hypertension, including dilated portal vein and splenomegaly with numerous portosystemic collateral pathways. Previously identified lesion in segment 8 is not confidently noted on today's examination. 2. Numerous nonobstructive calculi within the collecting systems of both kidneys. No ureteral stones or findings of urinary tract obstruction are noted at this time. 3. Aortic atherosclerosis. 4. Colonic diverticulosis without evidence of acute diverticulitis at this time. Electronically Signed   By: Trudie Reed M.D.   On: 03/04/2017 14:08   Dg Hips Bilat W Or Wo Pelvis 3-4  Views  Result Date: 03/06/2017 CLINICAL DATA:  Suspected hip pain with decreased motility. EXAM: DG HIP (WITH OR WITHOUT PELVIS) 3-4V BILAT COMPARISON:  None. FINDINGS: There is an age-indeterminate avulsion fracture of the right greater trochanter. Otherwise, there is no evidence of fracture or dislocation. Minimal osteoarthritic changes of the left hip. IMPRESSION: Age-indeterminate avulsion fracture of the right greater trochanter. No evidence of left hip fracture. Electronically Signed   By: Ted Mcalpine M.D.   On: 03/23/2017 12:58   DVT Prophylaxis -SCD  AM Labs Ordered, also please review Full Orders  Family Communication: Admission, patients condition and plan of care including tests being ordered have been discussed  with the patient and wife who indicate understanding and agree with the plan   Code Status - Full Code  Likely DC to  Home   Condition   Stable  Shon Hale M.D on 03/20/2017 at 5:52 PM  Between 7am to 7pm - Pager - [phone] After 7pm go to [email or web address] - password [email or web address]  [facility] - Office  [phone]  Voice Recognition Reubin Milan dictation system was used to create this note, attempts have been made to correct errors. Please contact the author with questions and/or clarifications.

## 2017-03-21 NOTE — ED Provider Notes (Signed)
[facility]-EMERGENCY DEPT Provider Note   CSN: [id number] Arrival date & time: 03/15/2017  1105     History   Chief Complaint Chief Complaint  Patient presents with  . Hip Pain  . Abdominal Pain  . Altered Mental Status  . Jaundice    HPI Kurt Reid is a 67 y.o. male.  HPI   67 year old male with history of liver cirrhosis secondary to hepatitis C, diabetes, bipolar, stage IV liver disease sent here by PCP for evaluation of abdominal pain and confusion.  Patient is a poor historian, history obtained through wife who is at bedside.  For the past 2 weeks patient has been complaining of pain in his left ankle.  Wife noticed that the ankle is a bit swollen.  For the same duration he has also been complaining of pain to his right upper quadrant.  He has had extensive workup including chest x-ray, ultrasound, nuclear scan without and any definitive diagnosis.  He developed pain to his hips for the past 3 or 4 days worsening with movement.  Pain is sharp and throbbing, mild at rest and moderate with movement.  Patient has been mostly bedbound due to the pain and he has been eating and drinking much due to not having any appetite.  After some endorsed mild nausea without vomiting.  He has been taking his lactulose, and his diabetic medication because he is not eating much and he does not want to have bowel movement while in bed.  He has not had a bowel movement in the past 2-3 days.  Wife is noticed that patient is more confused than usual since he has been out of his medication.  She is also worried for dehydration as he is not eating and drinking much.  He has had prior hip fracture.  He does not report fever, chills, chest pain or trouble breathing or dysuria.  He has never had any paracentesis in the past. Wife did report pt has had low grade fever for several weeks.   Past Medical History:  Diagnosis Date  . ADHD 09/23/2006  . ADHD (attention deficit hyperactivity  disorder)   . Allergic rhinitis   . ALLERGIC RHINITIS 09/23/2006  . Anxiety   . ANXIETY 04/19/2009  . Benign prostatic hypertrophy   . BENIGN PROSTATIC HYPERTROPHY 11/02/2007  . Bipolar 1 disorder (HCC)   . BIPOLAR AFFECTIVE DISORDER 09/23/2006  . Chronic pain syndrome 01/18/2009  . 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 Depression   . DEPRESSION 09/23/2006  . Diabetes mellitus without complication (HCC)   . Diverticulosis of colon   . DIVERTICULOSIS, COLON 04/19/2009  . ESOPHAGEAL VARICES 04/19/2009  . ETOH abuse    hx  . GERD 09/23/2006  . GERD (gastroesophageal reflux disease)   . Hepatitis C   . HEPATITIS C 09/23/2006  . Hyperlipidemia   . HYPERLIPIDEMIA 09/23/2006  . INSOMNIA-SLEEP DISORDER-UNSPEC 04/19/2009  . Low back pain    chronic  . LOW BACK PAIN 09/23/2006  . Migraine   . MRSA (methicillin resistant staph aureus) culture positive   . Nephrolithiasis    hx  . NEPHROLITHIASIS, HX OF 11/02/2007  . SEIZURE DISORDER 09/23/2006  . Seizure disorder (HCC)    H/O  . Sinusitis    recurrent    Patient Active Problem List   Diagnosis Date Noted  . Abnormal MRI of abdomen 11/20/2016  . Skin lesion 05/22/2016  . Liver nodule 11/17/2015  . Gynecomastia 06/02/2014  .  Hypotension 01/18/2014  . Dog bite of multiple sites of right hand and fingers 12/10/2013  . DM type 2 without retinopathy (HCC) 12/10/2013  . Chronic hepatitis C (HCC) 12/10/2013  . Dyslipidemia 12/10/2013  . Hemoptysis 06/05/2013  . Acquired ptosis of right eyelid 08/26/2012  . Left lumbar radiculopathy 08/09/2011  . Abdominal pain, RUQ 08/09/2011  . Abscess of abdominal wall 07/24/2011  . 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 Acute sinus infection 04/06/2011  . ADHD (attention deficit hyperactivity disorder) 04/06/2011  . Hepatic cirrhosis due to chronic hepatitis C infection (HCC) 11/23/2010  . Encounter for well adult exam with abnormal findings 07/23/2010  .  Anxiety state 04/19/2009  . ESOPHAGEAL VARICES 04/19/2009  . DIVERTICULOSIS, COLON 04/19/2009  . INSOMNIA-SLEEP DISORDER-UNSPEC 04/19/2009  . Chronic pain syndrome 01/18/2009  . BENIGN PROSTATIC HYPERTROPHY 11/02/2007  . FATIGUE 11/02/2007  . NEPHROLITHIASIS, HX OF 11/02/2007  . BIPOLAR AFFECTIVE DISORDER 09/23/2006  . Depression 09/23/2006  . Allergic rhinitis 09/23/2006  . GERD 09/23/2006  . LOW BACK PAIN 09/23/2006  . Convulsions (HCC) 09/23/2006    Past Surgical History:  Procedure Laterality Date  . back surgury     x 5  . cheekbone     hx of fx  . I&D EXTREMITY Right 12/10/2013   Procedure: IRRIGATION AND DEBRIDEMENT OF MULTIPLE DOG BITES RIGHT HAND AND WRIST;  Surgeon: Dominica Severin, MD;  Location: [facility] OR;  Service: Orthopedics;  Laterality: Right;  . knee surgury     x 1  . s/p sinus surgury  2010   Dr. Zara Chess  . TONSILLECTOMY         Home Medications    Prior to Admission medications   Medication Sig Start Date End Date Taking? Authorizing Provider  aspirin (ASPIRIN EC) 81 MG EC tablet Take 81 mg by mouth daily.      [provider]  B-D UF III MINI PEN NEEDLES 31G X 5 MM MISC USE AS DIRECTED ONCE DAILY    [name], MD  chlorhexidine (HIBICLENS) 4 % external liquid Apply topically as directed. Bathe 3x/week. Avoid face and genitalia Patient not taking: Reported on 03/20/2017 07/23/16   Nche, Bonna Gains, NP  desvenlafaxine (PRISTIQ) 50 MG 24 hr tablet Take 1 tablet (50 mg total) by mouth daily. Patient not taking: Reported on 03/04/2017 05/22/16   [name], MD  fluticasone [facility]) 50 MCG/ACT nasal spray PLACE 2 SPRAYS INTO BOTH NOSTRILS DAILY. 02/13/15   [name], MD  gabapentin (NEURONTIN) 300 MG capsule TAKE 2 CAPSULES BY MOUTH TWICE DAILY 03/05/17   [name], MD  glucose blood (ONE TOUCH ULTRA TEST) test strip USE AS DIRECTED THREE TIMES DAILY 04/27/15   [name], MD  HYDROcodone-acetaminophen [facility]) 10-325 MG per tablet  Take 1 tablet by mouth 2 (two) times daily as needed for severe pain.     [provider]  Insulin NPH Isophane & Regular (RELION 70/30 [place]) Inject into the skin. Sliding scale Dr. Lilli Light    [provider]  lactulose (CHRONULAC) 10 GM/15ML solution Take 10 g by mouth daily as needed for mild constipation or moderate constipation.    [provider]  Lancets Misc. (ONE TOUCH SURESOFT) MISC Use as directed once per day 11/10/12   [name], MD  metFORMIN (GLUCOPHAGE-XR) 500 MG 24 hr tablet Take 2 tablets (1,000 mg total) by mouth daily with breakfast. 05/24/16   [name], MD  omeprazole (PRILOSEC) 20  MG capsule Take 1 capsule (20 mg total) by mouth daily. 06/11/16   [name], MD  propranolol (INDERAL) 20 MG tablet TAKE 1 TABLET BY MOUTH 2 TIMES DAILY 07/15/16   [name], MD  RELION INSULIN SYRINGE 1ML/31G 31G X 5/16" 1 ML MISC  05/31/16   [provider]  spironolactone (ALDACTONE) 50 MG tablet Take 50 mg by mouth 1 day or 1 dose. 07/15/16   [provider]  triamcinolone (NASACORT AQ) 55 MCG/ACT AERO nasal inhaler Place 2 sprays into the nose daily. 05/22/16   [name], MD  venlafaxine (EFFEXOR) 75 MG tablet TAKE 1 TABLET BY MOUTH 3 TIMES DAILY WITH MEALS 07/15/16   [name], MD    Family History Family History  Problem Relation Age of Onset  . Dementia Father   . Stroke Mother   . Hyperlipidemia Mother   . Depression Mother   . Hypertension Mother   . Asthma Daughter   . Diabetes Neg Hx     Social History Social History   Tobacco Use  . Smoking status: Former Games developer  . Smokeless tobacco: Never Used  Substance Use Topics  . Alcohol use: No  . Drug use: No     Allergies   Duloxetine; Latex; Levofloxacin; Mercury; and Sulfonamide derivatives   Review of Systems Review of Systems  Unable to perform ROS: Mental status change     Physical Exam Updated Vital Signs BP 123/77 (BP Location: Right Arm)   Pulse 97    Temp 98.5 F (36.9 C) (Oral)   Resp 17   Ht 6\' 1"  (1.854 m)   Wt 103.4 kg (228 lb)   SpO2 94%   BMI 30.08 kg/m   Physical Exam  Constitutional:  Ill-appearing male lying in bed in no acute discomfort.  HENT:  Mouth is dry  Eyes: Scleral icterus is present.  Cardiovascular: Normal rate and regular rhythm.  Pulmonary/Chest: Effort normal. He has rales (Faint rales heard at the lower lung base).  Abdominal: He exhibits distension. He exhibits no fluid wave and no pulsatile midline mass. There is no tenderness.  Abdomen is mildly distended  Musculoskeletal:  Tenderness to left lateral hip increased with hip flexion without any deformity or overlying skin changes.  No shortening of leg  Skin: Capillary refill takes less than 2 seconds.  Nursing note and vitals reviewed.    ED Treatments / Results  Labs (all labs ordered are listed, but only abnormal results are displayed) Labs Reviewed  BLOOD CULTURE ID PANEL (REFLEXED) - Abnormal; Notable for the following components:      Result Value   Staphylococcus species DETECTED (*)    Staphylococcus aureus DETECTED (*)    Methicillin resistance DETECTED (*)    All other components within normal limits  CBC WITH DIFFERENTIAL/PLATELET - Abnormal; Notable for the following components:   WBC 30.2 (*)    HCT 38.6 (*)    Neutro Abs 27.2 (*)    All other components within normal limits  COMPREHENSIVE METABOLIC PANEL - Abnormal; Notable for the following components:   Sodium 126 (*)    Chloride 94 (*)    Glucose, Bld 320 (*)    BUN 24 (*)    Calcium 8.3 (*)    Albumin 2.4 (*)    AST 53 (*)    Alkaline Phosphatase 180 (*)    Total Bilirubin 4.4 (*)    All other components within normal limits  AMMONIA - Abnormal; Notable for  the following components:   Ammonia 37 (*)    All other components within normal limits  URINALYSIS, ROUTINE W REFLEX MICROSCOPIC - Abnormal; Notable for the following components:   Color, Urine AMBER (*)     Glucose, UA 150 (*)    Hgb urine dipstick SMALL (*)    Bilirubin Urine SMALL (*)    Protein, ur 30 (*)    Nitrite POSITIVE (*)    Leukocytes, UA MODERATE (*)    Bacteria, UA FEW (*)    All other components within normal limits  SEDIMENTATION RATE - Abnormal; Notable for the following components:   Sed Rate 74 (*)    All other components within normal limits  CBC - Abnormal; Notable for the following components:   WBC 20.1 (*)    RBC 3.91 (*)    Hemoglobin 12.0 (*)    HCT 35.2 (*)    Platelets 120 (*)    All other components within normal limits  GLUCOSE, CAPILLARY - Abnormal; Notable for the following components:   Glucose-Capillary 246 (*)    All other components within normal limits  GLUCOSE, CAPILLARY - Abnormal; Notable for the following components:   Glucose-Capillary 249 (*)    All other components within normal limits  COMPREHENSIVE METABOLIC PANEL - Abnormal; Notable for the following components:   Sodium 129 (*)    Chloride 98 (*)    Glucose, Bld 249 (*)    BUN 35 (*)    Creatinine, Ser 1.41 (*)    Calcium 7.8 (*)    Total Protein 5.9 (*)    Albumin 1.9 (*)    AST 60 (*)    Alkaline Phosphatase 165 (*)    Total Bilirubin 3.7 (*)    GFR calc non Af Amer 50 (*)    GFR calc Af Amer 58 (*)    All other components within normal limits  GLUCOSE, CAPILLARY - Abnormal; Notable for the following components:   Glucose-Capillary 241 (*)    All other components within normal limits  I-STAT CG4 LACTIC ACID, ED - Abnormal; Notable for the following components:   Lactic Acid, Venous 2.90 (*)    All other components within normal limits  I-STAT CG4 LACTIC ACID, ED - Abnormal; Notable for the following components:   Lactic Acid, Venous 2.74 (*)    All other components within normal limits  CBG MONITORING, ED - Abnormal; Notable for the following components:   Glucose-Capillary 244 (*)    All other components within normal limits  CULTURE, BLOOD (ROUTINE X 2)  CULTURE,  BLOOD (ROUTINE X 2)  URINE CULTURE  LIPASE, BLOOD    EKG  EKG Interpretation None       Radiology Dg Chest 2 View  Result Date: 03/26/2017 CLINICAL DATA:  Confusion. EXAM: CHEST  2 VIEW COMPARISON:  03/10/2017 FINDINGS: The cardiac silhouette, mediastinal and hilar contours are within normal limits and stable. Stable tortuosity of the thoracic aorta. The lungs demonstrate mild chronic bronchitic changes but no infiltrates, edema or effusions. IMPRESSION: No acute cardiopulmonary findings. Electronically Signed   By: Rudie MeyerP.  Gallerani M.D.   On: 03/16/2017 15:59   Dg Ankle Complete Left  Result Date: 03/19/2017 CLINICAL DATA:  Swelling, pain and stiffness. EXAM: LEFT ANKLE COMPLETE - 3+ VIEW COMPARISON:  None. FINDINGS: There is no fracture or dislocation. Slight degenerative changes at the ankle joint with a small ankle effusion and soft tissue swelling around the ankle. IMPRESSION: 1. Slight arthritic changes at the ankle joint with  a small joint effusion and soft tissue swelling. 2. No fractures. Electronically Signed   By: Francene Boyers M.D.   On: Apr 09, 2017 12:59   Ct Abdomen Pelvis W Contrast  Result Date: Apr 09, 2017 CLINICAL DATA:  67 year old male with history of abdominal pain, confusion and hip pain. Jaundice. EXAM: CT ABDOMEN AND PELVIS WITH CONTRAST TECHNIQUE: Multidetector CT imaging of the abdomen and pelvis was performed using the standard protocol following bolus administration of intravenous contrast. CONTRAST:  ISOVUE-300 IOPAMIDOL (ISOVUE-300) INJECTION 61% COMPARISON:  No priors. FINDINGS: Lower chest: Areas of mild cylindrical bronchiectasis are noted in the lung bases, most evident in the right middle lobe where there is also extensive thickening of the peribronchovascular interstitium and associated peribronchovascular micro and macronodularity as well as regional architectural distortion, most compatible with areas of chronic mucoid impaction in the setting of chronic  post infectious or inflammatory scarring. Hepatobiliary: Liver has a shrunken appearance and nodular contour, compatible with advanced cirrhosis. Contrast bolus is suboptimal on today's examination, and with this limitation in mind there is no discrete hypervascular lesion confidently identified. No intra or extrahepatic biliary ductal dilatation. Gallbladder is normal in appearance. Pancreas: No pancreatic mass. No pancreatic ductal dilatation. No pancreatic or peripancreatic fluid or inflammatory changes. Spleen: Spleen is enlarged measuring 18.0 x 8.4 x 17.1 cm (estimated splenic volume of 1,293 mL) . Adrenals/Urinary Tract: There are multiple nonobstructive calculi throughout the collecting systems of both kidneys measuring up to 4 mm in the interpolar collecting system of the right kidney. No additional calculi are noted along the course of either ureter or within the lumen of the urinary bladder. No hydroureteronephrosis. Mild multifocal cortical thinning in the right kidney. 5.6 cm simple cyst in the interpolar region of the left kidney. No suspicious renal lesions. Urinary bladder is normal in appearance. Bilateral adrenal glands are normal in appearance. Stomach/Bowel: Normal appearance of the stomach. No pathologic dilatation of small bowel or colon. Numerous colonic diverticulae are noted, without surrounding inflammatory changes to suggest an acute diverticulitis at this time. Normal appendix. Vascular/Lymphatic: Aortic atherosclerosis, without evidence of aneurysm or dissection in the abdominal or pelvic vasculature. Numerous portosystemic collateral pathways, most evident for large splenorenal collaterals and small proximal gastric varices. Portal vein remains patent at this time and is dilated measuring up to 18 mm in diameter in the porta hepatis. No lymphadenopathy. Reproductive: Prostate gland and seminal vesicles are unremarkable in appearance. Other: Small volume of ascites.  No pneumoperitoneum.  Musculoskeletal: There are no aggressive appearing lytic or blastic lesions noted in the visualized portions of the skeleton. IMPRESSION: 1. Morphologic changes of advanced cirrhosis with evidence of portal hypertension, including dilated portal vein and splenomegaly with numerous portosystemic collateral pathways. Previously identified lesion in segment 8 is not confidently noted on today's examination. 2. Numerous nonobstructive calculi within the collecting systems of both kidneys. No ureteral stones or findings of urinary tract obstruction are noted at this time. 3. Aortic atherosclerosis. 4. Colonic diverticulosis without evidence of acute diverticulitis at this time. Electronically Signed   By: Trudie Reed M.D.   On: Apr 09, 2017 14:08   Mr Hip Right Wo Contrast  Result Date: 03/22/2017 CLINICAL DATA:  Bilateral hip pain. Recent fall. Age-indeterminate fracture of the right greater trochanter on radiographs. EXAM: MR OF THE RIGHT HIP WITHOUT CONTRAST TECHNIQUE: Multiplanar, multisequence MR imaging was performed. No intravenous contrast was administered. COMPARISON:  Pelvic CT 04/09/17, hip radiographs 2017-04-09 and right hip CT 01/28/2015. FINDINGS: Despite efforts by the technologist and patient,  mild motion artifact is present on today's exam and could not be eliminated. This reduces exam sensitivity and specificity. Bones: No evidence of acute fracture, dislocation or femoral head avascular necrosis. There is a chronic fracture of the right femoral greater trochanter which is posteriorly displaced without healing (nonunion). This was demonstrated as an acute injury on the right hip CT 01/28/2015. Lower lumbar spondylosis noted. The sacroiliac joints appear normal. Articular cartilage and labrum Articular cartilage:  Mild degenerative changes of both hips. Labrum:  No gross labral tear or paralabral cyst. Joint or bursal effusion Joint effusion:  No significant hip joint effusion. Bursae: No focal  periarticular fluid collections are identified. The right hip periarticular soft tissues appear unremarkable. However, on the left, there is prominent soft tissue edema surrounding the hip, greatest posteriorly. This tracks along the gemellus and obturator internus muscles. No focal fluid collection identified. Muscles and tendons Muscles and tendons: As above, there is periarticular soft tissue edema surrounding the left hip, involving gemellus and obturator internus muscles. There is a component along the medial pelvic side wall. There is also some edema within the gluteus musculature. The left common hamstring tendon demonstrates tendinosis and partial tearing. No significant right hip tendon abnormalities are seen. The right piriformis muscle is chronically atrophied. Other findings Miscellaneous:  Mild ascites noted. IMPRESSION: 1. 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 This fracture was demonstrated as an acute injury in 2016. 2. No acute osseous findings identified. No significant hip or sacroiliac joint effusion. 3. Soft tissue edema surrounding the left hip, likely posttraumatic or potentially inflammatory. No focal abscess. The left common hamstring tendon appears partially torn, and this could be the etiology. Electronically Signed   By: Carey Bullocks M.D.   On: 03/22/2017 11:53   Dg Hips Bilat W Or Wo Pelvis 3-4 Views  Result Date: 03/26/2017 CLINICAL DATA:  Suspected hip pain with decreased motility. EXAM: DG HIP (WITH OR WITHOUT PELVIS) 3-4V BILAT COMPARISON:  None. FINDINGS: There is an age-indeterminate avulsion fracture of the right greater trochanter. Otherwise, there is no evidence of fracture or dislocation. Minimal osteoarthritic changes of the left hip. IMPRESSION: Age-indeterminate avulsion fracture of the right greater trochanter. No evidence of left hip fracture. Electronically Signed   By: Ted Mcalpine M.D.   On: 03/07/2017 12:58     Procedures Procedures (including critical care time)  Medications Ordered in ED Medications  iopamidol (ISOVUE-300) 61 % injection (not administered)  aspirin EC tablet 81 mg (81 mg Oral Given 03/22/17 0935)  pantoprazole (PROTONIX) EC tablet 40 mg (40 mg Oral Given 03/22/17 0935)  propranolol (INDERAL) tablet 20 mg (20 mg Oral Given 03/22/17 0935)  gabapentin (NEURONTIN) capsule 600 mg (600 mg Oral Given 03/22/17 0935)  sodium chloride flush (NS) 0.9 % injection 3 mL (3 mLs Intravenous Not Given 03/22/17 0937)  sodium chloride flush (NS) 0.9 % injection 3 mL (not administered)  0.9 %  sodium chloride infusion (not administered)  acetaminophen (TYLENOL) tablet 650 mg (not administered)    Or  acetaminophen (TYLENOL) suppository 650 mg (not administered)  ondansetron (ZOFRAN) tablet 4 mg (not administered)    Or  ondansetron (ZOFRAN) injection 4 mg (not administered)  albuterol (PROVENTIL) (2.5 MG/3ML) 0.083% nebulizer solution 2.5 mg (not administered)  heparin injection 5,000 Units (5,000 Units Subcutaneous Given 03/22/17 1458)  0.9 %  sodium chloride infusion ( Intravenous New Bag/Given 03/22/17 1245)  lactulose (CHRONULAC) 10 GM/15ML solution 30 g (30 g Oral Given 03/22/17 0935)  multivitamin with  minerals tablet 1 tablet (1 tablet Oral Given 03/22/17 0935)  insulin aspart (novoLOG) injection 0-9 Units (3 Units Subcutaneous Given 03/22/17 1245)  cefTRIAXone (ROCEPHIN) 2 g in dextrose 5 % 50 mL IVPB (0 g Intravenous Stopped 03/25/2017 2049)  venlafaxine (EFFEXOR) tablet 75 mg (75 mg Oral Given 03/22/17 1245)  ibuprofen (ADVIL,MOTRIN) tablet 600 mg (600 mg Oral Given 03/22/17 1457)  guaiFENesin-dextromethorphan (ROBITUSSIN DM) 100-10 MG/5ML syrup 5 mL (5 mLs Oral Given 03/22/17 1457)  metoprolol tartrate (LOPRESSOR) injection 5 mg (not administered)  vancomycin (VANCOCIN) 1,750 mg in sodium chloride 0.9 % 500 mL IVPB (0 mg Intravenous Stopped 03/22/17 0806)  diltiazem (CARDIZEM) injection 5 mg  (not administered)  morphine 4 MG/ML injection 4 mg (4 mg Intravenous Given 03/28/2017 1402)  ondansetron (ZOFRAN) injection 4 mg (4 mg Intravenous Given 03/17/2017 1402)  iopamidol (ISOVUE-300) 61 % injection 100 mL (100 mLs Intravenous Contrast Given 04/02/2017 1315)  sodium chloride 0.9 % bolus 500 mL (0 mLs Intravenous Stopped 03/22/2017 1809)  sodium chloride 0.9 % bolus 500 mL (500 mLs Intravenous New Bag/Given 03/22/17 0434)  digoxin (LANOXIN) 0.25 MG/ML injection 0.25 mg (0.25 mg Intravenous Given 03/22/17 0433)     Initial Impression / Assessment and Plan / ED Course  I have reviewed the triage vital signs and the nursing notes.  Pertinent labs & imaging results that were available during my care of the patient were reviewed by me and considered in my medical decision making (see chart for details).     BP (!) 128/113 (BP Location: Left Arm)   Pulse (!) 109   Temp 97.7 F (36.5 C) (Oral)   Resp 18   Ht 6\' 1"  (1.854 m)   Wt 105.7 kg (233 lb 0.4 oz)   SpO2 96%   BMI 30.74 kg/m    Final Clinical Impressions(s) / ED Diagnoses   Final diagnoses:  Lower urinary tract infectious disease  Delirium    ED Discharge Orders    None     2:46 PM Pt here with c/o L hip pain, L ankle pain and mild abd pain.  Has increased confusion according to wife.  He does have mild elevation of ammonia level of 37.  However his lactic acid is 2.9, elevated WBC of 30.2 and Na+ 126.  Tbili is 4.4.  Abd/pelvis CT without acute changes, Hip/pelvis, and L ankle obtained without concerning features.  UA and CXR have not resulted yet.    Pt has BP of 99/75, and low grade temp of 99.5 rectally.  I discussed this with Dr. Madilyn Hook, given his liver disease which can account for the elevated lactic acid, and since we do not have a definite source of infection, we will not initiate antibiotic or fluid resuscitation under sepsis protocol.  No obvious source of infection identified yet.  Pt may need L hip MRI for further  evaluation.    Appreciate consultation from [facility] Dr. Marisa Severin, who agrees to see pt in the ER and will admit for further evaluation.  I request nursing to perform In and Out cath to obtain urine.  Pt is not tachycardic.       Fayrene Helper, PA-C 03/22/17 1603    Tilden Fossa, MD 03/12/2017 1020

## 2017-03-21 NOTE — ED Triage Notes (Signed)
Patient went to PCP for c/o abdominal pain, confusion, and hip pain. Patient was sent to the ED. Patient has not been taking lactulose because he can not get out of the bed. Patient has had increased confusion since he has not been taking the lactulose. Patient is jaundiced. Patient has Stage IV liver disease.

## 2017-03-22 ENCOUNTER — Inpatient Hospital Stay (HOSPITAL_COMMUNITY): Payer: Medicare Other

## 2017-03-22 DIAGNOSIS — Z8781 Personal history of (healed) traumatic fracture: Secondary | ICD-10-CM

## 2017-03-22 DIAGNOSIS — Z91041 Radiographic dye allergy status: Secondary | ICD-10-CM

## 2017-03-22 DIAGNOSIS — G9341 Metabolic encephalopathy: Secondary | ICD-10-CM

## 2017-03-22 DIAGNOSIS — R4182 Altered mental status, unspecified: Secondary | ICD-10-CM

## 2017-03-22 DIAGNOSIS — D72829 Elevated white blood cell count, unspecified: Secondary | ICD-10-CM

## 2017-03-22 DIAGNOSIS — R233 Spontaneous ecchymoses: Secondary | ICD-10-CM

## 2017-03-22 DIAGNOSIS — I361 Nonrheumatic tricuspid (valve) insufficiency: Secondary | ICD-10-CM

## 2017-03-22 DIAGNOSIS — Z87891 Personal history of nicotine dependence: Secondary | ICD-10-CM

## 2017-03-22 DIAGNOSIS — R7881 Bacteremia: Secondary | ICD-10-CM

## 2017-03-22 DIAGNOSIS — M549 Dorsalgia, unspecified: Secondary | ICD-10-CM

## 2017-03-22 DIAGNOSIS — N390 Urinary tract infection, site not specified: Secondary | ICD-10-CM

## 2017-03-22 DIAGNOSIS — Z91040 Latex allergy status: Secondary | ICD-10-CM

## 2017-03-22 DIAGNOSIS — Z818 Family history of other mental and behavioral disorders: Secondary | ICD-10-CM

## 2017-03-22 DIAGNOSIS — Z810 Family history of intellectual disabilities: Secondary | ICD-10-CM

## 2017-03-22 DIAGNOSIS — M25551 Pain in right hip: Secondary | ICD-10-CM

## 2017-03-22 DIAGNOSIS — Z888 Allergy status to other drugs, medicaments and biological substances status: Secondary | ICD-10-CM

## 2017-03-22 DIAGNOSIS — Z881 Allergy status to other antibiotic agents status: Secondary | ICD-10-CM

## 2017-03-22 DIAGNOSIS — Z882 Allergy status to sulfonamides status: Secondary | ICD-10-CM

## 2017-03-22 LAB — COMPREHENSIVE METABOLIC PANEL
ALBUMIN: 1.9 g/dL — AB (ref 3.5–5.0)
ALT: 29 U/L (ref 17–63)
AST: 60 U/L — AB (ref 15–41)
Alkaline Phosphatase: 165 U/L — ABNORMAL HIGH (ref 38–126)
Anion gap: 9 (ref 5–15)
BILIRUBIN TOTAL: 3.7 mg/dL — AB (ref 0.3–1.2)
BUN: 35 mg/dL — AB (ref 6–20)
CO2: 22 mmol/L (ref 22–32)
CREATININE: 1.41 mg/dL — AB (ref 0.61–1.24)
Calcium: 7.8 mg/dL — ABNORMAL LOW (ref 8.9–10.3)
Chloride: 98 mmol/L — ABNORMAL LOW (ref 101–111)
GFR calc Af Amer: 58 mL/min — ABNORMAL LOW (ref >60)
GFR, EST NON AFRICAN AMERICAN: 50 mL/min — AB (ref >60)
GLUCOSE: 249 mg/dL — AB (ref 65–99)
Potassium: 3.9 mmol/L (ref 3.5–5.1)
Sodium: 129 mmol/L — ABNORMAL LOW (ref 135–145)
TOTAL PROTEIN: 5.9 g/dL — AB (ref 6.5–8.1)

## 2017-03-22 LAB — BLOOD CULTURE ID PANEL (REFLEXED)
Acinetobacter baumannii: NOT DETECTED
CANDIDA GLABRATA: NOT DETECTED
CANDIDA TROPICALIS: NOT DETECTED
Candida albicans: NOT DETECTED
Candida krusei: NOT DETECTED
Candida parapsilosis: NOT DETECTED
ENTEROBACTER CLOACAE COMPLEX: NOT DETECTED
ENTEROCOCCUS SPECIES: NOT DETECTED
Enterobacteriaceae species: NOT DETECTED
Escherichia coli: NOT DETECTED
Haemophilus influenzae: NOT DETECTED
Klebsiella oxytoca: NOT DETECTED
Klebsiella pneumoniae: NOT DETECTED
LISTERIA MONOCYTOGENES: NOT DETECTED
Methicillin resistance: DETECTED — AB
NEISSERIA MENINGITIDIS: NOT DETECTED
PROTEUS SPECIES: NOT DETECTED
Pseudomonas aeruginosa: NOT DETECTED
STAPHYLOCOCCUS AUREUS BCID: DETECTED — AB
STAPHYLOCOCCUS SPECIES: DETECTED — AB
STREPTOCOCCUS AGALACTIAE: NOT DETECTED
Serratia marcescens: NOT DETECTED
Streptococcus pneumoniae: NOT DETECTED
Streptococcus pyogenes: NOT DETECTED
Streptococcus species: NOT DETECTED

## 2017-03-22 LAB — GLUCOSE, CAPILLARY
GLUCOSE-CAPILLARY: 249 mg/dL — AB (ref 65–99)
GLUCOSE-CAPILLARY: 241 mg/dL — AB (ref 65–99)
GLUCOSE-CAPILLARY: 275 mg/dL — AB (ref 65–99)
Glucose-Capillary: 307 mg/dL — ABNORMAL HIGH (ref 65–99)

## 2017-03-22 LAB — CBC
HEMATOCRIT: 35.2 % — AB (ref 39.0–52.0)
HEMOGLOBIN: 12 g/dL — AB (ref 13.0–17.0)
MCH: 30.7 pg (ref 26.0–34.0)
MCHC: 34.1 g/dL (ref 30.0–36.0)
MCV: 90 fL (ref 78.0–100.0)
Platelets: 120 10*3/uL — ABNORMAL LOW (ref 150–400)
RBC: 3.91 MIL/uL — AB (ref 4.22–5.81)
RDW: 15.4 % (ref 11.5–15.5)
WBC: 20.1 10*3/uL — ABNORMAL HIGH (ref 4.0–10.5)

## 2017-03-22 LAB — ECHOCARDIOGRAM COMPLETE
Height: 73 in
WEIGHTICAEL: 3728.42 [oz_av]

## 2017-03-22 MED ORDER — DILTIAZEM HCL 25 MG/5ML IV SOLN
5.0000 mg | Freq: Once | INTRAVENOUS | Status: DC
  Filled 2017-03-22: qty 5

## 2017-03-22 MED ORDER — DIGOXIN 0.25 MG/ML IJ SOLN
0.2500 mg | Freq: Once | INTRAMUSCULAR | Status: AC
  Administered 2017-03-22: 0.25 mg via INTRAVENOUS
  Filled 2017-03-22: qty 1

## 2017-03-22 MED ORDER — VANCOMYCIN HCL 10 G IV SOLR
1750.0000 mg | INTRAVENOUS | Status: DC
  Administered 2017-03-22 – 2017-03-25 (×4): 1750 mg via INTRAVENOUS
  Filled 2017-03-22 (×4): qty 1750

## 2017-03-22 MED ORDER — METOPROLOL TARTRATE 5 MG/5ML IV SOLN: 5.0000 mg | Freq: Once | INTRAVENOUS | Status: DC | PRN

## 2017-03-22 MED ORDER — SODIUM CHLORIDE 0.9 % IV BOLUS (SEPSIS)
500.0000 mL | Freq: Once | INTRAVENOUS | Status: AC
  Administered 2017-03-22: 500 mL via INTRAVENOUS

## 2017-03-22 NOTE — Evaluation (Signed)
Physical Therapy Evaluation Patient Details Name: Kurt Reid MRN: [id number] DOB: 10-Jan-1951 Today's Date: 03/22/2017   History of Present Illness   Kurt Reid is a 67 y.o. male  With hx of treated hep C cirrhosis c/b PSE and portal hypertension/ EV, bipolar disease, admitted for worsening AMS, and leukocytosis with left shift.   Clinical Impression  Pt admitted with above diagnosis. Pt currently with functional limitations due to the deficits listed below (see PT Problem List).  Pt will benefit from skilled PT to increase their independence and safety with mobility to allow discharge to the venue listed below.  Pt needing to use bathroom upon arrival and ambulated into bathroom and back to recliner with RW.  He has increased confusion and difficulty following instructions.  He is c/o L LE pain and was educated on use of RW to A with pain management during weight bearing.  Recommend HHPT with 24 hour S.       Follow Up Recommendations Home health PT;Supervision/Assistance - 24 hour    Equipment Recommendations  Rolling walker with 5" wheels    Recommendations for Other Services       Precautions / Restrictions Precautions Precautions: Fall Restrictions Weight Bearing Restrictions: No      Mobility  Bed Mobility Overal bed mobility: Needs Assistance Bed Mobility: Supine to Sit     Supine to sit: Supervision        Transfers Overall transfer level: Needs assistance Equipment used: Rolling walker (2 wheeled) Transfers: Sit to/from Stand Sit to Stand: Min assist         General transfer comment: cues for hand placement and MIN A to power up.  Slow transition to stand with wide BOS.  Ambulation/Gait Ambulation/Gait assistance: Min guard;Min assist Ambulation Distance (Feet): 15 Feet(x2) Assistive device: Rolling walker (2 wheeled) Gait Pattern/deviations: Antalgic;Wide base of support Gait velocity: decreased   General Gait Details: Antalgic gait with  min/guard to MIN A with turning due to decreased balance and decreased understanding of directions.  Stairs            Wheelchair Mobility    Modified Rankin (Stroke Patients Only)       Balance Overall balance assessment: Needs assistance Sitting-balance support: Bilateral upper extremity supported Sitting balance-Leahy Scale: Good       Standing balance-Leahy Scale: Poor                               Pertinent Vitals/Pain Pain Assessment: 0-10 Pain Score: 6  Pain Location: L hip Pain Descriptors / Indicators: Grimacing Pain Intervention(s): Limited activity within patient's tolerance;Monitored during session;Repositioned    Home Living Family/patient expects to be discharged to:: Private residence Living Arrangements: Spouse/significant other   Type of Home: House Home Access: Stairs to enter Entrance Stairs-Rails: Right;Left;Can reach both Secretary/administrator of Steps: 6-8 Home Layout: One level Home Equipment: Cane - single point      Prior Function Level of Independence: Independent         Comments: Just started using cane last week due to pain     Hand Dominance        Extremity/Trunk Assessment   Upper Extremity Assessment Upper Extremity Assessment: Overall WFL for tasks assessed    Lower Extremity Assessment Lower Extremity Assessment: Generalized weakness;LLE deficits/detail LLE Deficits / Details: c/o pain in L hip, knee and ankle LLE: Unable to fully assess due to pain  Communication      Cognition Arousal/Alertness: Awake/alert   Overall Cognitive Status: Impaired/Different from baseline Area of Impairment: Following commands;Problem solving;Safety/judgement;Orientation                 Orientation Level: Disoriented to;Time     Following Commands: Follows one step commands with increased time;Follows multi-step commands inconsistently Safety/Judgement: Decreased awareness of safety   Problem  Solving: Slow processing;Difficulty sequencing        General Comments General comments (skin integrity, edema, etc.): Pt's conversations not always making sense and had a difficult time staying on task.  Wife stating he was a little clearer earlier in the day.    Exercises     Assessment/Plan    PT Assessment Patient needs continued PT services  PT Problem List         PT Treatment Interventions DME instruction;Gait training;Stair training;Functional mobility training;Therapeutic exercise;Therapeutic activities;Balance training    PT Goals (Current goals can be found in the Care Plan section)  Acute Rehab PT Goals Patient Stated Goal: clear confusion PT Goal Formulation: With family Time For Goal Achievement: 04/05/17 Potential to Achieve Goals: Good    Frequency Min 3X/week   Barriers to discharge        Co-evaluation               AM-PAC PT "6 Clicks" Daily Activity  Outcome Measure Difficulty turning over in bed (including adjusting bedclothes, sheets and blankets)?: A Little Difficulty moving from lying on back to sitting on the side of the bed? : A Little Difficulty sitting down on and standing up from a chair with arms (e.g., wheelchair, bedside commode, etc,.)?: Unable Help needed moving to and from a bed to chair (including a wheelchair)?: A Little Help needed walking in hospital room?: A Little Help needed climbing 3-5 steps with a railing? : A Lot 6 Click Score: 15    End of Session Equipment Utilized During Treatment: Gait belt Activity Tolerance: Patient limited by pain Patient left: in chair;with call bell/phone within reach;with chair alarm set Nurse Communication: Mobility status PT Visit Diagnosis: Unsteadiness on feet (R26.81);Difficulty in walking, not elsewhere classified (R26.2)    Time: [id number] PT Time Calculation (min) (ACUTE ONLY): 25 min   Charges:   PT Evaluation $PT Eval Moderate Complexity: 1 Mod PT Treatments $Gait  Training: 8-22 mins   PT G Codes:        Kurt Reid, [place] Pager [phone] 03/22/2017   Kurt Reid 03/22/2017, 2:30 PM

## 2017-03-22 NOTE — Progress Notes (Signed)
Pharmacy Antibiotic Note  Edwena BundeRichard S Weathersby is a 67 y.o. male admitted on 03/27/2017 with bacteremia.  Pharmacy has been consulted for Vancomycin dosing.  Plan: Vancomycin 1750mg  iv q24hr Goal AUC = 400 - 500 for all indications, except meningitis (goal AUC > 500 and Cmin 15-20 mcg/mL)   Height: 6\' 1"  (185.4 cm) Weight: 233 lb 0.4 oz (105.7 kg) IBW/kg (Calculated) : 79.9  Temp (24hrs), Avg:99.2 F (37.3 C), Min:97.7 F (36.5 C), Max:101.1 F (38.4 C)  Recent Labs  Lab 03/18/2017 1131 03/13/2017 1313 03/30/2017 1503  WBC 30.2*  --   --   CREATININE 1.15  --   --   LATICACIDVEN  --  2.90* 2.74*    Estimated Creatinine Clearance: 79.5 mL/min (by C-G formula based on SCr of 1.15 mg/dL).    Allergies  Allergen Reactions  . Duloxetine     REACTION: memory dysfunction, dizziness  . Latex Other (See Comments)    Reaction unknown  . Levofloxacin Nausea Only  . Mercury Other (See Comments)    Reaction unknown  . Sulfonamide Derivatives Other (See Comments)    Reaction unknown    Antimicrobials this admission: Vancomycin 03/22/2017 >> Ceftriaxone 03/05/2017 >>   Dose adjustments this admission: -  Microbiology results: pending  Thank you for allowing pharmacy to be a part of this patient's care.  Aleene DavidsonGrimsley Jr, Jules Baty Crowford 03/22/2017 5:51 AM

## 2017-03-22 NOTE — Progress Notes (Signed)
New onset A.Fib. VS 97.7, 100/57, 96% on RA. HR ranging from 90s as high as 140s. EKG obtained and showed A.fib with RVR and R BBB. On-call paged. Awaiting orders.

## 2017-03-22 NOTE — Consult Note (Signed)
[facility]  Total days of antibiotics 2        Day 2 vancomycin/ceftriaxone               Reason for Consult: MRSA bacteremia    Referring Physician: Starla Link  Principal Problem:   Altered mental status Active Problems:   BIPOLAR AFFECTIVE DISORDER   Chronic pain syndrome   Esophageal varices (HCC)   Hepatic cirrhosis due to chronic hepatitis C infection ([place])   DM type 2 without retinopathy (HCC)   Chronic hepatitis C (HCC)   Leukocytosis   Sepsis (HCC)   UTI (urinary tract infection)    HPI: WM SAHAGUN is a 67 y.o. male  With hx of treated hep C cirrhosis c/b PSE and portal hypertension/ EV, bipolar disease, admitted for worsening AMS, and leukocytosis with left shift. On admit, he was found febrile Tmax 101F,  mildly elevated LA of 2.9, leukocytosis of 30K- 90%N, his abd exam was not remarkable and had little ascites on CT exam, his CXR also did not show any evidence of infiltrate, and ua had + nitrites with some pyuria. Prior to admit he complained of right hip pain. He was started on ceftriaxone for presumed UTI but his infectious work up revealed + MRSA bacteremia thus vancomycin was started. After getting iv abtx, lactulose overnight, his mentation has improved slightly per his wife's report. Leukocytosis down to 20K.  Past Medical History:  Diagnosis Date  . ADHD 09/23/2006  . ADHD (attention deficit hyperactivity disorder)   . Allergic rhinitis   . ALLERGIC RHINITIS 09/23/2006  . Anxiety   . ANXIETY 04/19/2009  . Benign prostatic hypertrophy   . BENIGN PROSTATIC HYPERTROPHY 11/02/2007  . Bipolar 1 disorder ([place])   . BIPOLAR AFFECTIVE DISORDER 09/23/2006  . Chronic pain syndrome 01/18/2009  . 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 Depression   . DEPRESSION 09/23/2006  . Diabetes mellitus without complication ([place])   . Diverticulosis of colon   . DIVERTICULOSIS, COLON 04/19/2009  . ESOPHAGEAL VARICES 04/19/2009  . ETOH abuse    hx  . GERD 09/23/2006  . GERD (gastroesophageal reflux disease)   . Hepatitis C   . HEPATITIS C 09/23/2006  . Hyperlipidemia   . HYPERLIPIDEMIA 09/23/2006  . INSOMNIA-SLEEP DISORDER-UNSPEC 04/19/2009  . Low back pain    chronic  . LOW BACK PAIN 09/23/2006  . Migraine   . MRSA (methicillin resistant staph aureus) culture positive   . Nephrolithiasis    hx  . NEPHROLITHIASIS, HX OF 11/02/2007  . SEIZURE DISORDER 09/23/2006  . Seizure disorder ([place])    H/O  . Sinusitis    recurrent    Allergies:  Allergies  Allergen Reactions  . Duloxetine     REACTION: memory dysfunction, dizziness  . Latex Other (See Comments)    Reaction unknown  . Levofloxacin Nausea Only  . Mercury Other (See Comments)    Reaction unknown  . Sulfonamide Derivatives Other (See Comments)    Reaction unknown   MEDICATIONS: . aspirin EC  81 mg Oral Daily  . diltiazem  5 mg Intravenous Once  . gabapentin  600 mg Oral BID  . heparin  5,000 Units Subcutaneous Q8H  . insulin aspart  0-9 Units Subcutaneous TID WC  . lactulose  30 g Oral TID  . multivitamin with minerals  1 tablet Oral Daily  . pantoprazole  40 mg Oral Daily  . propranolol  20 mg Oral BID  .  sodium chloride flush  3 mL Intravenous Q12H  . venlafaxine  75 mg Oral TID WC    Social History   Tobacco Use  . Smoking status: Former Research scientist (life sciences)  . Smokeless tobacco: Never Used  Substance Use Topics  . Alcohol use: No  . Drug use: No    Family History  Problem Relation Age of Onset  . Dementia Father   . Stroke Mother   . Hyperlipidemia Mother   . Depression Mother   . Hypertension Mother   . Asthma Daughter   . Diabetes Neg Hx     Review of Systems -   Constitutional: positive for fever, chills, diaphoresis, activity change, appetite change, fatigue and unexpected weight change.  HENT: Negative for congestion, sore throat, rhinorrhea, sneezing, trouble swallowing and sinus pressure.  Eyes: Negative for photophobia and visual  disturbance.  Respiratory: Negative for cough, chest tightness, shortness of breath, wheezing and stridor.  Cardiovascular: Negative for chest pain, palpitations and leg swelling.  Gastrointestinal: Negative for nausea, vomiting, abdominal pain, diarrhea, constipation, blood in stool, abdominal distention and anal bleeding.  Genitourinary: Negative for dysuria, hematuria, flank pain and difficulty urinating.  Musculoskeletal: +right hip pain and occ back pain  Skin: Negative for color change, pallor, rash and wound.  Neurological: Negative for dizziness, tremors, weakness and light-headedness.  Hematological: Negative for adenopathy. Does not bruise/bleed easily.  Psychiatric/Behavioral: Negative for behavioral problems, confusion, sleep disturbance, dysphoric mood, decreased concentration and agitation.     OBJECTIVE: Temp:  [97.7 F (36.5 C)-101.1 F (38.4 C)] 97.7 F (36.5 C) (01/19 0356) Pulse Rate:  [86-136] 109 (01/19 0640) Resp:  [15-24] 18 (01/18 2124) BP: (97-137)/(57-113) 128/113 (01/19 0640) SpO2:  [93 %-99 %] 96 % (01/19 0356) Weight:  [233 lb 0.4 oz (105.7 kg)] 233 lb 0.4 oz (105.7 kg) (01/19 0356) Physical Exam  Constitutional: He is oriented to person, place, and time. He appears well-developed and well-nourished. No distress.  HENT:  Mouth/Throat: Oropharynx is clear and moist. No oropharyngeal exudate.  Cardiovascular: Normal rate, regular rhythm and normal heart sounds. Exam reveals no gallop and no friction rub.  No murmur heard.  Pulmonary/Chest: Effort normal and breath sounds normal. No respiratory distress. He has no wheezes.  Abdominal: protuberant. Soft. Bowel sounds are normal. He exhibits no distension. There is no tenderness.  Lymphadenopathy:  He has no cervical adenopathy.  Neurological: He is alert and oriented to person, place, and time.  Skin: Skin is warm and dry. No rash noted. No erythema. Scattered echymosis Ext: no c/c/e Psychiatric: He has a  normal mood and affect. His behavior is normal.    LABS: Results for orders placed or performed during the hospital encounter of 03/10/2017 (from the past 48 hour(s))  Sedimentation rate     Status: Abnormal   Collection Time: 03/18/2017 11:30 AM  Result Value Ref Range   Sed Rate 74 (H) 0 - 16 mm/hr  CBC with Differential/Platelet     Status: Abnormal   Collection Time: 03/27/2017 11:31 AM  Result Value Ref Range   WBC 30.2 (H) 4.0 - 10.5 K/uL   RBC 4.37 4.22 - 5.81 MIL/uL   Hemoglobin 13.6 13.0 - 17.0 g/dL   HCT 38.6 (L) 39.0 - 52.0 %   MCV 88.3 78.0 - 100.0 fL   MCH 31.1 26.0 - 34.0 pg   MCHC 35.2 30.0 - 36.0 g/dL   RDW 14.7 11.5 - 15.5 %   Platelets 208 150 - 400 K/uL   Neutrophils Relative %  90 %   Lymphocytes Relative 7 %   Monocytes Relative 3 %   Eosinophils Relative 0 %   Basophils Relative 0 %   Neutro Abs 27.2 (H) 1.7 - 7.7 K/uL   Lymphs Abs 2.1 0.7 - 4.0 K/uL   Monocytes Absolute 0.9 0.1 - 1.0 K/uL   Eosinophils Absolute 0.0 0.0 - 0.7 K/uL   Basophils Absolute 0.0 0.0 - 0.1 K/uL   WBC Morphology MILD LEFT SHIFT (1-5% METAS, OCC MYELO, OCC BANDS)     Comment: DOHLE BODIES  Comprehensive metabolic panel     Status: Abnormal   Collection Time: 03/17/2017 11:31 AM  Result Value Ref Range   Sodium 126 (L) 135 - 145 mmol/L   Potassium 4.4 3.5 - 5.1 mmol/L   Chloride 94 (L) 101 - 111 mmol/L   CO2 22 22 - 32 mmol/L   Glucose, Bld 320 (H) 65 - 99 mg/dL   BUN 24 (H) 6 - 20 mg/dL   Creatinine, Ser 1.15 0.61 - 1.24 mg/dL   Calcium 8.3 (L) 8.9 - 10.3 mg/dL   Total Protein 6.9 6.5 - 8.1 g/dL   Albumin 2.4 (L) 3.5 - 5.0 g/dL   AST 53 (H) 15 - 41 U/L   ALT 28 17 - 63 U/L   Alkaline Phosphatase 180 (H) 38 - 126 U/L   Total Bilirubin 4.4 (H) 0.3 - 1.2 mg/dL   GFR calc non Af Amer >60 >60 mL/min   GFR calc Af Amer >60 >60 mL/min    Comment: (NOTE) The eGFR has been calculated using the CKD EPI equation. This calculation has not been validated in all clinical situations. eGFR's  persistently <60 mL/min signify possible Chronic Kidney Disease.    Anion gap 10 5 - 15  Lipase, blood     Status: None   Collection Time: 03/19/2017 11:31 AM  Result Value Ref Range   Lipase 30 11 - 51 U/L  Culture, blood (routine x 2)     Status: None (Preliminary result)   Collection Time: 03/07/2017 11:31 AM  Result Value Ref Range   Specimen Description BLOOD BLOOD RIGHT FOREARM    Special Requests      BOTTLES DRAWN AEROBIC AND ANAEROBIC Blood Culture adequate volume   Culture  Setup Time      GRAM POSITIVE COCCI IN BOTH AEROBIC AND ANAEROBIC BOTTLES CRITICAL RESULT CALLED TO, READ BACK BY AND VERIFIED WITHLavell Luster [facility] [id number] 03/22/17 A BROWNING Performed at [facility], [place] [address] [place], [place] [address]    Culture GRAM POSITIVE COCCI    Report Status PENDING   Blood Culture ID Panel (Reflexed)     Status: Abnormal   Collection Time: 03/08/2017 11:31 AM  Result Value Ref Range   Enterococcus species NOT DETECTED NOT DETECTED   Listeria monocytogenes NOT DETECTED NOT DETECTED   Staphylococcus species DETECTED (A) NOT DETECTED    Comment: CRITICAL RESULT CALLED TO, READ BACK BY AND VERIFIED WITHLavell Luster PHARMD [id number] 03/22/17 A BROWNING    Staphylococcus aureus DETECTED (A) NOT DETECTED    Comment: Methicillin (oxacillin)-resistant Staphylococcus aureus (MRSA). MRSA is predictably resistant to beta-lactam antibiotics (except ceftaroline). Preferred therapy is vancomycin unless clinically contraindicated. Patient requires contact precautions if  hospitalized. CRITICAL RESULT CALLED TO, READ BACK BY AND VERIFIED WITHLavell Luster PHARMD [id number] 03/22/17 A BROWNING    Methicillin resistance DETECTED (A) NOT DETECTED    Comment: CRITICAL RESULT CALLED TO, READ BACK BY AND VERIFIED WITH: J  GRIMSLEY PHARMD [id number] 03/22/17 A BROWNING    Streptococcus species NOT DETECTED NOT DETECTED   Streptococcus agalactiae NOT DETECTED NOT DETECTED   Streptococcus pneumoniae NOT  DETECTED NOT DETECTED   Streptococcus pyogenes NOT DETECTED NOT DETECTED   Acinetobacter baumannii NOT DETECTED NOT DETECTED   Enterobacteriaceae species NOT DETECTED NOT DETECTED   Enterobacter cloacae complex NOT DETECTED NOT DETECTED   Escherichia coli NOT DETECTED NOT DETECTED   Klebsiella oxytoca NOT DETECTED NOT DETECTED   Klebsiella pneumoniae NOT DETECTED NOT DETECTED   Proteus species NOT DETECTED NOT DETECTED   Serratia marcescens NOT DETECTED NOT DETECTED   Haemophilus influenzae NOT DETECTED NOT DETECTED   Neisseria meningitidis NOT DETECTED NOT DETECTED   Pseudomonas aeruginosa NOT DETECTED NOT DETECTED   Candida albicans NOT DETECTED NOT DETECTED   Candida glabrata NOT DETECTED NOT DETECTED   Candida krusei NOT DETECTED NOT DETECTED   Candida parapsilosis NOT DETECTED NOT DETECTED   Candida tropicalis NOT DETECTED NOT DETECTED    Comment: Performed at [facility], [place] [address], [place], [place] [address]  Ammonia     Status: Abnormal   Collection Time: 03/20/2017 11:32 AM  Result Value Ref Range   Ammonia 37 (H) 9 - 35 umol/L  Culture, blood (routine x 2)     Status: None (Preliminary result)   Collection Time: 03/18/2017  1:07 PM  Result Value Ref Range   Specimen Description BLOOD BLOOD LEFT FOREARM    Special Requests      BOTTLES DRAWN AEROBIC AND ANAEROBIC Blood Culture adequate volume   Culture  Setup Time      GRAM POSITIVE COCCI IN BOTH AEROBIC AND ANAEROBIC BOTTLES CRITICAL RESULT CALLED TO, READ BACK BY AND VERIFIED WITH: Sanda Klein, AT [id number] 03/22/17 BY Rush Landmark Performed at [facility], [place] [address], [place], [place] [address]    Culture GRAM POSITIVE COCCI    Report Status PENDING   I-Stat CG4 Lactic Acid, ED     Status: Abnormal   Collection Time: 03/27/2017  1:13 PM  Result Value Ref Range   Lactic Acid, Venous 2.90 (HH) 0.5 - 1.9 mmol/L   Comment NOTIFIED PHYSICIAN   I-Stat CG4 Lactic Acid, ED     Status: Abnormal   Collection  Time: 03/19/2017  3:03 PM  Result Value Ref Range   Lactic Acid, Venous 2.74 (HH) 0.5 - 1.9 mmol/L   Comment NOTIFIED PHYSICIAN   Urinalysis, Routine w reflex microscopic     Status: Abnormal   Collection Time: 03/29/2017  3:04 PM  Result Value Ref Range   Color, Urine AMBER (A) YELLOW    Comment: BIOCHEMICALS MAY BE AFFECTED BY COLOR   APPearance CLEAR CLEAR   Specific Gravity, Urine 1.018 1.005 - 1.030   pH 5.0 5.0 - 8.0   Glucose, UA 150 (A) NEGATIVE mg/dL   Hgb urine dipstick SMALL (A) NEGATIVE   Bilirubin Urine SMALL (A) NEGATIVE   Ketones, ur NEGATIVE NEGATIVE mg/dL   Protein, ur 30 (A) NEGATIVE mg/dL   Nitrite POSITIVE (A) NEGATIVE   Leukocytes, UA MODERATE (A) NEGATIVE   RBC / HPF 0-5 0 - 5 RBC/hpf   WBC, UA 6-30 0 - 5 WBC/hpf   Bacteria, UA FEW (A) NONE SEEN   Squamous Epithelial / LPF NONE SEEN NONE SEEN   Mucus PRESENT    Hyaline Casts, UA PRESENT   CBG monitoring, ED     Status: Abnormal   Collection Time: 03/12/2017  5:54  PM  Result Value Ref Range   Glucose-Capillary 244 (H) 65 - 99 mg/dL  Glucose, capillary     Status: Abnormal   Collection Time: 03/12/2017  9:21 PM  Result Value Ref Range   Glucose-Capillary 246 (H) 65 - 99 mg/dL  CBC     Status: Abnormal   Collection Time: 03/22/17  6:59 AM  Result Value Ref Range   WBC 20.1 (H) 4.0 - 10.5 K/uL   RBC 3.91 (L) 4.22 - 5.81 MIL/uL   Hemoglobin 12.0 (L) 13.0 - 17.0 g/dL   HCT 35.2 (L) 39.0 - 52.0 %   MCV 90.0 78.0 - 100.0 fL   MCH 30.7 26.0 - 34.0 pg   MCHC 34.1 30.0 - 36.0 g/dL   RDW 15.4 11.5 - 15.5 %   Platelets 120 (L) 150 - 400 K/uL  Glucose, capillary     Status: Abnormal   Collection Time: 03/22/17  7:30 AM  Result Value Ref Range   Glucose-Capillary 249 (H) 65 - 99 mg/dL   Comment 1 Notify RN   Comprehensive metabolic panel     Status: Abnormal   Collection Time: 03/22/17  9:58 AM  Result Value Ref Range   Sodium 129 (L) 135 - 145 mmol/L   Potassium 3.9 3.5 - 5.1 mmol/L   Chloride 98 (L) 101 - 111  mmol/L   CO2 22 22 - 32 mmol/L   Glucose, Bld 249 (H) 65 - 99 mg/dL   BUN 35 (H) 6 - 20 mg/dL   Creatinine, Ser 1.41 (H) 0.61 - 1.24 mg/dL   Calcium 7.8 (L) 8.9 - 10.3 mg/dL   Total Protein 5.9 (L) 6.5 - 8.1 g/dL   Albumin 1.9 (L) 3.5 - 5.0 g/dL   AST 60 (H) 15 - 41 U/L   ALT 29 17 - 63 U/L   Alkaline Phosphatase 165 (H) 38 - 126 U/L   Total Bilirubin 3.7 (H) 0.3 - 1.2 mg/dL   GFR calc non Af Amer 50 (L) >60 mL/min   GFR calc Af Amer 58 (L) >60 mL/min    Comment: (NOTE) The eGFR has been calculated using the CKD EPI equation. This calculation has not been validated in all clinical situations. eGFR's persistently <60 mL/min signify possible Chronic Kidney Disease.    Anion gap 9 5 - 15    MICRO:  IMAGING: Dg Chest 2 View  Result Date: 03/24/2017 CLINICAL DATA:  Confusion. EXAM: CHEST  2 VIEW COMPARISON:  03/10/2017 FINDINGS: The cardiac silhouette, mediastinal and hilar contours are within normal limits and stable. Stable tortuosity of the thoracic aorta. The lungs demonstrate mild chronic bronchitic changes but no infiltrates, edema or effusions. IMPRESSION: No acute cardiopulmonary findings. Electronically Signed   By: Marijo Sanes M.D.   On: 03/17/2017 15:59   Dg Ankle Complete Left  Result Date: 03/09/2017 CLINICAL DATA:  Swelling, pain and stiffness. EXAM: LEFT ANKLE COMPLETE - 3+ VIEW COMPARISON:  None. FINDINGS: There is no fracture or dislocation. Slight degenerative changes at the ankle joint with a small ankle effusion and soft tissue swelling around the ankle. IMPRESSION: 1. Slight arthritic changes at the ankle joint with a small joint effusion and soft tissue swelling. 2. No fractures. Electronically Signed   By: Lorriane Shire M.D.   On: 03/27/2017 12:59   Ct Abdomen Pelvis W Contrast  Result Date: 03/09/2017 CLINICAL DATA:  67 year old male with history of abdominal pain, confusion and hip pain. Jaundice. EXAM: CT ABDOMEN AND PELVIS WITH CONTRAST TECHNIQUE:  Multidetector  CT imaging of the abdomen and pelvis was performed using the standard protocol following bolus administration of intravenous contrast. CONTRAST:  198m ISOVUE-300 IOPAMIDOL (ISOVUE-300) INJECTION 61% COMPARISON:  No priors. FINDINGS: Lower chest: Areas of mild cylindrical bronchiectasis are noted in the lung bases, most evident in the right middle lobe where there is also extensive thickening of the peribronchovascular interstitium and associated peribronchovascular micro and macronodularity as well as regional architectural distortion, most compatible with areas of chronic mucoid impaction in the setting of chronic post infectious or inflammatory scarring. Hepatobiliary: Liver has a shrunken appearance and nodular contour, compatible with advanced cirrhosis. Contrast bolus is suboptimal on today's examination, and with this limitation in mind there is no discrete hypervascular lesion confidently identified. No intra or extrahepatic biliary ductal dilatation. Gallbladder is normal in appearance. Pancreas: No pancreatic mass. No pancreatic ductal dilatation. No pancreatic or peripancreatic fluid or inflammatory changes. Spleen: Spleen is enlarged measuring 18.0 x 8.4 x 17.1 cm (estimated splenic volume of 1,293 mL) . Adrenals/Urinary Tract: There are multiple nonobstructive calculi throughout the collecting systems of both kidneys measuring up to 4 mm in the interpolar collecting system of the right kidney. No additional calculi are noted along the course of either ureter or within the lumen of the urinary bladder. No hydroureteronephrosis. Mild multifocal cortical thinning in the right kidney. 5.6 cm simple cyst in the interpolar region of the left kidney. No suspicious renal lesions. Urinary bladder is normal in appearance. Bilateral adrenal glands are normal in appearance. Stomach/Bowel: Normal appearance of the stomach. No pathologic dilatation of small bowel or colon. Numerous colonic diverticulae  are noted, without surrounding inflammatory changes to suggest an acute diverticulitis at this time. Normal appendix. Vascular/Lymphatic: Aortic atherosclerosis, without evidence of aneurysm or dissection in the abdominal or pelvic vasculature. Numerous portosystemic collateral pathways, most evident for large splenorenal collaterals and small proximal gastric varices. Portal vein remains patent at this time and is dilated measuring up to 18 mm in diameter in the porta hepatis. No lymphadenopathy. Reproductive: Prostate gland and seminal vesicles are unremarkable in appearance. Other: Small volume of ascites.  No pneumoperitoneum. Musculoskeletal: There are no aggressive appearing lytic or blastic lesions noted in the visualized portions of the skeleton. IMPRESSION: 1. Morphologic changes of advanced cirrhosis with evidence of portal hypertension, including dilated portal vein and splenomegaly with numerous portosystemic collateral pathways. Previously identified lesion in segment 8 is not confidently noted on today's examination. 2. Numerous nonobstructive calculi within the collecting systems of both kidneys. No ureteral stones or findings of urinary tract obstruction are noted at this time. 3. Aortic atherosclerosis. 4. Colonic diverticulosis without evidence of acute diverticulitis at this time. Electronically Signed   By: DVinnie LangtonM.D.   On: 04/01/2017 14:08   Dg Hips Bilat W Or Wo Pelvis 3-4 Views  Result Date: 03/27/2017 CLINICAL DATA:  Suspected hip pain with decreased motility. EXAM: DG HIP (WITH OR WITHOUT PELVIS) 3-4V BILAT COMPARISON:  None. FINDINGS: There is an age-indeterminate avulsion fracture of the right greater trochanter. Otherwise, there is no evidence of fracture or dislocation. Minimal osteoarthritic changes of the left hip. IMPRESSION: Age-indeterminate avulsion fracture of the right greater trochanter. No evidence of left hip fracture. Electronically Signed   By: DFidela SalisburyM.D.   On: 04/01/2017 12:58    Assessment/Plan:  89yoM with hx of treated hep C cirrrhosis admitted for fevers, AMS found to have leukocytosis and MRSA bacteremia but unclear source.  - continue on vancomycin with vanco trough  of 15-20. Avoid placing picc line until can document clearance of bacteremia - recommend repeat blood culture tomorrow to see that he is clearing his bacteremia - recommend TEE to rule out endocarditis - agree with getting MRI of hip to see if any evidence of septic arthritis - anticipate to need minimum of 2 wk possibly 4-6 wk pending echo findings  AMS = likely worsened due to hyperammonia, missing lactulose doses priro to admit and his mrsa bacteremia. Continue with lactulose.  Hip pain = has dated avulsion fracture to greater tronchanter. Await mri read to see if any signs of infection  Hx of back pain = no hw in spine, appears at baseline per his wife

## 2017-03-22 NOTE — Progress Notes (Signed)
Patient ID: Kurt Reid, male   DOB: [birth year], 67 y.o.   MRN: [id number]  PROGRESS NOTE    DYON ROTERT  WUJ:[id number] DOB: 14-Feb-1951 DOA: 03/15/2017 PCP: [name], MD   Brief Narrative:  67 year old male with history of liver cirrhosis secondary to hepatitis C, diabetes, bipolar disorder sent by PCP for evaluation of abdominal pain and confusion.  Patient was admitted with leukocytosis probably from UTI and started on antibiotics.   Assessment & Plan:   Principal Problem:   Altered mental status Active Problems:   BIPOLAR AFFECTIVE DISORDER   Chronic pain syndrome   Esophageal varices (HCC)   Hepatic cirrhosis due to chronic hepatitis C infection (HCC)   DM type 2 without retinopathy (HCC)   Chronic hepatitis C (HCC)   Leukocytosis   Sepsis (HCC)   UTI (urinary tract infection)  MRSA bacteremia -Questionable cause.  Continue vancomycin.  Repeat a.m. blood culture.  Spoke to Dr. Snider/infectious disease will see the patient in consultation -2D echo  Probable acute metabolic encephalopathy from bacteremia/UTI/mild hepatic encephalopathy causing altered mental status -Mental status is improving but he still confused.  Leukocytosis is improving  -Continue Rocephin and vancomycin. -Continue lactulose -CT abdomen and pelvis showed only small ascites; chest x-ray on admission did not show infiltrates  Possible UTI -Continue Rocephin.  Follow cultures  Leukocytosis -Probably second above.  Improving.  Continue antibiotics  Decompensated liver Cirrhosis with portal hypertension and mild hepatic encephalopathy -Continue lactulose and propranolol.  Outpatient follow-up with GI  -CT abdomen showed only small ascites so doubt that patient has SBP   Bil Hip Pain with avulsion fraction of right greater trochanter -We will consult orthopedics -fall precautions  Hyponatremia -Probably from dehydration.  Currently getting intravenous fluids.  Repeat  labs  Thrombocytopenia - probably from cirrhosis. Monitor. No signs of bleeding.   DVT prophylaxis: SCDs Code Status: Full Family Communication: None at bedside Disposition Plan:Depends on clinical outcome  Consultants: ID, Orthopedics  Procedures: None  Antimicrobials: Vancomycin from 03/22/17 Rocephin from 04/02/2017    Subjective: Patient seen and examined at bedside. He is more awake but still confused. Denies current abdominal pain, nausea, fever.  Objective: Vitals:   03/17/2017 2124 03/22/17 0356 03/22/17 0401 03/22/17 0640  BP: 103/64 (!) 100/57  (!) 128/113  Pulse: 92  (!) 136 (!) 109  Resp: 18     Temp: 98.8 F (37.1 C) 97.7 F (36.5 C)    TempSrc: Oral Oral    SpO2: 96% 96%    Weight:  105.7 kg (233 lb 0.4 oz)    Height:        Intake/Output Summary (Last 24 hours) at 03/22/2017 0915 Last data filed at 03/22/2017 0806 Gross per 24 hour  Intake 2255 ml  Output 302 ml  Net 1953 ml   Filed Weights   03/19/2017 1115 03/22/17 0356  Weight: 103.4 kg (228 lb) 105.7 kg (233 lb 0.4 oz)    Examination:  General exam: Appears calm and comfortable. No distress Respiratory system: Bilateral decreased breath sound at bases Cardiovascular system: S1 & S2 heard, Tachycardic Gastrointestinal system: Abdomen is slightly distended, soft and nontender. Normal bowel sounds heard. Central nervous system: Awake but slightly confused to time. No focal neurological deficits. Moving extremities Extremities: No cyanosis, clubbing; trace edema Skin: No rashes, lesions or ulcers Lymph: No cervical lymphadenopathy  Data Reviewed: I have personally reviewed following labs and imaging studies  CBC: Recent Labs  Lab 25-Mar-2017 1131 03/22/17 0659  WBC 30.2*  20.1*  NEUTROABS 27.2*  --   HGB 13.6 12.0*  HCT 38.6* 35.2*  MCV 88.3 90.0  PLT 208 120*   Basic Metabolic Panel: Recent Labs  Lab 03/07/2017 1131  NA 126*  K 4.4  CL 94*  CO2 22  GLUCOSE 320*  BUN 24*  CREATININE  1.15  CALCIUM 8.3*   GFR: Estimated Creatinine Clearance: 79.5 mL/min (by C-G formula based on SCr of 1.15 mg/dL). Liver Function Tests: Recent Labs  Lab 03/09/2017 1131  AST 53*  ALT 28  ALKPHOS 180*  BILITOT 4.4*  PROT 6.9  ALBUMIN 2.4*   Recent Labs  Lab 03/05/2017 1131  LIPASE 30   Recent Labs  Lab 03/20/2017 1132  AMMONIA 37*   Coagulation Profile: No results for input(s): INR, PROTIME in the last 168 hours. Cardiac Enzymes: No results for input(s): CKTOTAL, CKMB, CKMBINDEX, TROPONINI in the last 168 hours. BNP (last 3 results) No results for input(s): PROBNP in the last 8760 hours. HbA1C: No results for input(s): HGBA1C in the last 72 hours. CBG: Recent Labs  Lab 03/28/2017 1754 03/20/2017 2121 03/22/17 0730  GLUCAP 244* 246* 249*   Lipid Profile: No results for input(s): CHOL, HDL, LDLCALC, TRIG, CHOLHDL, LDLDIRECT in the last 72 hours. Thyroid Function Tests: No results for input(s): TSH, T4TOTAL, FREET4, T3FREE, THYROIDAB in the last 72 hours. Anemia Panel: No results for input(s): VITAMINB12, FOLATE, FERRITIN, TIBC, IRON, RETICCTPCT in the last 72 hours. Sepsis Labs: Recent Labs  Lab 03/10/2017 1313 03/05/2017 1503  LATICACIDVEN 2.90* 2.74*    Recent Results (from the past 240 hour(s))  Culture, blood (routine x 2)     Status: None (Preliminary result)   Collection Time: 03/07/2017 11:31 AM  Result Value Ref Range Status   Specimen Description BLOOD BLOOD RIGHT FOREARM  Final   Special Requests   Final    BOTTLES DRAWN AEROBIC AND ANAEROBIC Blood Culture adequate volume   Culture  Setup Time   Final    GRAM POSITIVE COCCI IN BOTH AEROBIC AND ANAEROBIC BOTTLES CRITICAL RESULT CALLED TO, READ BACK BY AND VERIFIED WITHPeggyann Juba [facility] 9604 03/22/17 A BROWNING Performed at [facility], [address], [place], [place] [address]    Culture GRAM POSITIVE COCCI  Final   Report Status PENDING  Incomplete  Blood Culture ID Panel (Reflexed)     Status:  Abnormal   Collection Time: 03/26/2017 11:31 AM  Result Value Ref Range Status   Enterococcus species NOT DETECTED NOT DETECTED Final   Listeria monocytogenes NOT DETECTED NOT DETECTED Final   Staphylococcus species DETECTED (A) NOT DETECTED Final    Comment: CRITICAL RESULT CALLED TO, READ BACK BY AND VERIFIED WITHPeggyann Juba PHARMD 1191 03/22/17 A BROWNING    Staphylococcus aureus DETECTED (A) NOT DETECTED Final    Comment: Methicillin (oxacillin)-resistant Staphylococcus aureus (MRSA). MRSA is predictably resistant to beta-lactam antibiotics (except ceftaroline). Preferred therapy is vancomycin unless clinically contraindicated. Patient requires contact precautions if  hospitalized. CRITICAL RESULT CALLED TO, READ BACK BY AND VERIFIED WITHPeggyann Juba PHARMD [id number] 03/22/17 A BROWNING    Methicillin resistance DETECTED (A) NOT DETECTED Final    Comment: CRITICAL RESULT CALLED TO, READ BACK BY AND VERIFIED WITH: Peggyann Juba PHARMD [id number] 03/22/17 A BROWNING    Streptococcus species NOT DETECTED NOT DETECTED Final   Streptococcus agalactiae NOT DETECTED NOT DETECTED Final   Streptococcus pneumoniae NOT DETECTED NOT DETECTED Final   Streptococcus pyogenes NOT DETECTED NOT DETECTED Final   Acinetobacter  baumannii NOT DETECTED NOT DETECTED Final   Enterobacteriaceae species NOT DETECTED NOT DETECTED Final   Enterobacter cloacae complex NOT DETECTED NOT DETECTED Final   Escherichia coli NOT DETECTED NOT DETECTED Final   Klebsiella oxytoca NOT DETECTED NOT DETECTED Final   Klebsiella pneumoniae NOT DETECTED NOT DETECTED Final   Proteus species NOT DETECTED NOT DETECTED Final   Serratia marcescens NOT DETECTED NOT DETECTED Final   Haemophilus influenzae NOT DETECTED NOT DETECTED Final   Neisseria meningitidis NOT DETECTED NOT DETECTED Final   Pseudomonas aeruginosa NOT DETECTED NOT DETECTED Final   Candida albicans NOT DETECTED NOT DETECTED Final   Candida glabrata NOT DETECTED NOT DETECTED  Final   Candida krusei NOT DETECTED NOT DETECTED Final   Candida parapsilosis NOT DETECTED NOT DETECTED Final   Candida tropicalis NOT DETECTED NOT DETECTED Final    Comment: Performed at [facility], [address], [place], [place] [address]  Culture, blood (routine x 2)     Status: None (Preliminary result)   Collection Time: 03/06/2017  1:07 PM  Result Value Ref Range Status   Specimen Description BLOOD BLOOD LEFT FOREARM  Final   Special Requests   Final    BOTTLES DRAWN AEROBIC AND ANAEROBIC Blood Culture adequate volume   Culture  Setup Time   Final    GRAM POSITIVE COCCI IN BOTH AEROBIC AND ANAEROBIC BOTTLES CRITICAL RESULT CALLED TO, READ BACK BY AND VERIFIED WITH: Oneta Rack, AT 6045 03/22/17 BY Renato Shin Performed at [facility], [address], [place], [place] [address]    Culture GRAM POSITIVE COCCI  Final   Report Status PENDING  Incomplete         Radiology Studies: Dg Chest 2 View  Result Date: 03/05/2017 CLINICAL DATA:  Confusion. EXAM: CHEST  2 VIEW COMPARISON:  03/10/2017 FINDINGS: The cardiac silhouette, mediastinal and hilar contours are within normal limits and stable. Stable tortuosity of the thoracic aorta. The lungs demonstrate mild chronic bronchitic changes but no infiltrates, edema or effusions. IMPRESSION: No acute cardiopulmonary findings. Electronically Signed   By: Rudie Meyer M.D.   On: 04/01/2017 15:59   Dg Ankle Complete Left  Result Date: 03/25/2017 CLINICAL DATA:  Swelling, pain and stiffness. EXAM: LEFT ANKLE COMPLETE - 3+ VIEW COMPARISON:  None. FINDINGS: There is no fracture or dislocation. Slight degenerative changes at the ankle joint with a small ankle effusion and soft tissue swelling around the ankle. IMPRESSION: 1. Slight arthritic changes at the ankle joint with a small joint effusion and soft tissue swelling. 2. No fractures. Electronically Signed   By: Francene Boyers M.D.   On: 04/02/2017 12:59   Ct Abdomen Pelvis W  Contrast  Result Date: 03/07/2017 CLINICAL DATA:  67 year old male with history of abdominal pain, confusion and hip pain. Jaundice. EXAM: CT ABDOMEN AND PELVIS WITH CONTRAST TECHNIQUE: Multidetector CT imaging of the abdomen and pelvis was performed using the standard protocol following bolus administration of intravenous contrast. CONTRAST:  ISOVUE-300 IOPAMIDOL (ISOVUE-300) INJECTION 61% COMPARISON:  No priors. FINDINGS: Lower chest: Areas of mild cylindrical bronchiectasis are noted in the lung bases, most evident in the right middle lobe where there is also extensive thickening of the peribronchovascular interstitium and associated peribronchovascular micro and macronodularity as well as regional architectural distortion, most compatible with areas of chronic mucoid impaction in the setting of chronic post infectious or inflammatory scarring. Hepatobiliary: Liver has a shrunken appearance and nodular contour, compatible with advanced cirrhosis. Contrast bolus is  suboptimal on today's examination, and with this limitation in mind there is no discrete hypervascular lesion confidently identified. No intra or extrahepatic biliary ductal dilatation. Gallbladder is normal in appearance. Pancreas: No pancreatic mass. No pancreatic ductal dilatation. No pancreatic or peripancreatic fluid or inflammatory changes. Spleen: Spleen is enlarged measuring 18.0 x 8.4 x 17.1 cm (estimated splenic volume of 1,293 mL) . Adrenals/Urinary Tract: There are multiple nonobstructive calculi throughout the collecting systems of both kidneys measuring up to 4 mm in the interpolar collecting system of the right kidney. No additional calculi are noted along the course of either ureter or within the lumen of the urinary bladder. No hydroureteronephrosis. Mild multifocal cortical thinning in the right kidney. 5.6 cm simple cyst in the interpolar region of the left kidney. No suspicious renal lesions. Urinary bladder is normal in  appearance. Bilateral adrenal glands are normal in appearance. Stomach/Bowel: Normal appearance of the stomach. No pathologic dilatation of small bowel or colon. Numerous colonic diverticulae are noted, without surrounding inflammatory changes to suggest an acute diverticulitis at this time. Normal appendix. Vascular/Lymphatic: Aortic atherosclerosis, without evidence of aneurysm or dissection in the abdominal or pelvic vasculature. Numerous portosystemic collateral pathways, most evident for large splenorenal collaterals and small proximal gastric varices. Portal vein remains patent at this time and is dilated measuring up to 18 mm in diameter in the porta hepatis. No lymphadenopathy. Reproductive: Prostate gland and seminal vesicles are unremarkable in appearance. Other: Small volume of ascites.  No pneumoperitoneum. Musculoskeletal: There are no aggressive appearing lytic or blastic lesions noted in the visualized portions of the skeleton. IMPRESSION: 1. Morphologic changes of advanced cirrhosis with evidence of portal hypertension, including dilated portal vein and splenomegaly with numerous portosystemic collateral pathways. Previously identified lesion in segment 8 is not confidently noted on today's examination. 2. Numerous nonobstructive calculi within the collecting systems of both kidneys. No ureteral stones or findings of urinary tract obstruction are noted at this time. 3. Aortic atherosclerosis. 4. Colonic diverticulosis without evidence of acute diverticulitis at this time. Electronically Signed   By: Trudie Reed M.D.   On: 03/17/2017 14:08   Dg Hips Bilat W Or Wo Pelvis 3-4 Views  Result Date: 03/16/2017 CLINICAL DATA:  Suspected hip pain with decreased motility. EXAM: DG HIP (WITH OR WITHOUT PELVIS) 3-4V BILAT COMPARISON:  None. FINDINGS: There is an age-indeterminate avulsion fracture of the right greater trochanter. Otherwise, there is no evidence of fracture or dislocation. Minimal  osteoarthritic changes of the left hip. IMPRESSION: Age-indeterminate avulsion fracture of the right greater trochanter. No evidence of left hip fracture. Electronically Signed   By: Ted Mcalpine M.D.   On: 03/20/2017 12:58        Scheduled Meds: . aspirin EC  81 mg Oral Daily  . diltiazem  5 mg Intravenous Once  . gabapentin  600 mg Oral BID  . heparin  5,000 Units Subcutaneous Q8H  . insulin aspart  0-9 Units Subcutaneous TID WC  . lactulose  30 g Oral TID  . multivitamin with minerals  1 tablet Oral Daily  . pantoprazole  40 mg Oral Daily  . propranolol  20 mg Oral BID  . sodium chloride flush  3 mL Intravenous Q12H  . venlafaxine  75 mg Oral TID WC   Continuous Infusions: . sodium chloride    . sodium chloride 100 mL/hr at 03/04/2017 1831  . cefTRIAXone (ROCEPHIN)  IV Stopped (03/27/2017 2049)  . vancomycin Stopped (03/22/17 0806)     LOS: 1 day  Glade LloydKshitiz Lorrain Rivers, MD [facility] Pager [phone]  If 7PM-7AM, please contact night-coverage [email or web address] Password [email or web address] 03/22/2017, 9:15 AM

## 2017-03-22 NOTE — Progress Notes (Signed)
PT Cancellation Note  Patient Details Name: Kurt Reid MRN: [id number] DOB: [birth year]   Cancelled Treatment:    Reason Eval/Treat Not Completed: Patient at procedure or test/unavailable. Pt at MRI.  Will check back as able to complete PT eval.   Enzo MontgomeryKaren L Susumu Hackler 03/22/2017, 11:10 AM

## 2017-03-22 NOTE — Progress Notes (Signed)
Pt remains in A.fib after orders completed. On-call paged to update; no call back and no new orders placed at this time.

## 2017-03-22 NOTE — Progress Notes (Signed)
PHARMACY - PHYSICIAN COMMUNICATION CRITICAL VALUE ALERT - BLOOD CULTURE IDENTIFICATION (BCID)  Kurt Reid is an 67 y.o. male who [facility] with a chief complaint of AMS  Assessment:  Patient with UTI and now MRSA bacteremia (include suspected source if known)  Name of physician (or Provider) Contacted: X. Blount  Current antibiotics: Ceftriaxone 1gm iv q24hr  For UTI  Changes to prescribed antibiotics recommended:  Start vancomycin per pharmacy for bacteremia Recommendations accepted by provider  Results for orders placed or performed during the hospital encounter of 03/30/2017  Blood Culture ID Panel (Reflexed) (Collected: 03/19/2017 11:31 AM)  Result Value Ref Range   Enterococcus species NOT DETECTED NOT DETECTED   Listeria monocytogenes NOT DETECTED NOT DETECTED   Staphylococcus species DETECTED (A) NOT DETECTED   Staphylococcus aureus DETECTED (A) NOT DETECTED   Methicillin resistance DETECTED (A) NOT DETECTED   Streptococcus species NOT DETECTED NOT DETECTED   Streptococcus agalactiae NOT DETECTED NOT DETECTED   Streptococcus pneumoniae NOT DETECTED NOT DETECTED   Streptococcus pyogenes NOT DETECTED NOT DETECTED   Acinetobacter baumannii NOT DETECTED NOT DETECTED   Enterobacteriaceae species NOT DETECTED NOT DETECTED   Enterobacter cloacae complex NOT DETECTED NOT DETECTED   Escherichia coli NOT DETECTED NOT DETECTED   Klebsiella oxytoca NOT DETECTED NOT DETECTED   Klebsiella pneumoniae NOT DETECTED NOT DETECTED   Proteus species NOT DETECTED NOT DETECTED   Serratia marcescens NOT DETECTED NOT DETECTED   Haemophilus influenzae NOT DETECTED NOT DETECTED   Neisseria meningitidis NOT DETECTED NOT DETECTED   Pseudomonas aeruginosa NOT DETECTED NOT DETECTED   Candida albicans NOT DETECTED NOT DETECTED   Candida glabrata NOT DETECTED NOT DETECTED   Candida krusei NOT DETECTED NOT DETECTED   Candida parapsilosis NOT DETECTED NOT DETECTED   Candida tropicalis NOT DETECTED NOT DETECTED    Aleene DavidsonGrimsley Jr, Uzair Godley Crowford 03/22/2017  5:47 AM

## 2017-03-22 NOTE — Progress Notes (Signed)
Pt converted back to sinus rhythm; HR 82. Cardizem not given.

## 2017-03-22 NOTE — Progress Notes (Signed)
  Echocardiogram 2D Echocardiogram has been performed.  Eustacia Urbanek T Maylea Soria 03/22/2017, 3:48 PM

## 2017-03-22 NOTE — Progress Notes (Signed)
New order placed for Cardizem 5mg  IV.

## 2017-03-23 ENCOUNTER — Inpatient Hospital Stay (HOSPITAL_COMMUNITY): Payer: Medicare Other

## 2017-03-23 LAB — CBC WITH DIFFERENTIAL/PLATELET
BASOS ABS: 0.1 10*3/uL (ref 0.0–0.1)
Basophils Relative: 1 %
Eosinophils Absolute: 0.5 10*3/uL (ref 0.0–0.7)
Eosinophils Relative: 2 %
HEMATOCRIT: 34.8 % — AB (ref 39.0–52.0)
Hemoglobin: 11.6 g/dL — ABNORMAL LOW (ref 13.0–17.0)
LYMPHS ABS: 0.9 10*3/uL (ref 0.7–4.0)
LYMPHS PCT: 5 %
MCH: 30.1 pg (ref 26.0–34.0)
MCHC: 33.3 g/dL (ref 30.0–36.0)
MCV: 90.2 fL (ref 78.0–100.0)
MONO ABS: 1.1 10*3/uL — AB (ref 0.1–1.0)
MONOS PCT: 6 %
NEUTROS ABS: 16.5 10*3/uL — AB (ref 1.7–7.7)
Neutrophils Relative %: 86 %
Platelets: 110 10*3/uL — ABNORMAL LOW (ref 150–400)
RBC: 3.86 MIL/uL — ABNORMAL LOW (ref 4.22–5.81)
RDW: 15.6 % — ABNORMAL HIGH (ref 11.5–15.5)
WBC: 18.9 10*3/uL — ABNORMAL HIGH (ref 4.0–10.5)

## 2017-03-23 LAB — COMPREHENSIVE METABOLIC PANEL
ALT: 37 U/L (ref 17–63)
AST: 96 U/L — AB (ref 15–41)
Albumin: 1.8 g/dL — ABNORMAL LOW (ref 3.5–5.0)
Alkaline Phosphatase: 204 U/L — ABNORMAL HIGH (ref 38–126)
Anion gap: 6 (ref 5–15)
BILIRUBIN TOTAL: 3.1 mg/dL — AB (ref 0.3–1.2)
BUN: 44 mg/dL — AB (ref 6–20)
CO2: 21 mmol/L — ABNORMAL LOW (ref 22–32)
CREATININE: 1.59 mg/dL — AB (ref 0.61–1.24)
Calcium: 7.6 mg/dL — ABNORMAL LOW (ref 8.9–10.3)
Chloride: 103 mmol/L (ref 101–111)
GFR calc Af Amer: 50 mL/min — ABNORMAL LOW (ref >60)
GFR, EST NON AFRICAN AMERICAN: 43 mL/min — AB (ref >60)
Glucose, Bld: 292 mg/dL — ABNORMAL HIGH (ref 65–99)
POTASSIUM: 4.1 mmol/L (ref 3.5–5.1)
Sodium: 130 mmol/L — ABNORMAL LOW (ref 135–145)
TOTAL PROTEIN: 5.5 g/dL — AB (ref 6.5–8.1)

## 2017-03-23 LAB — MAGNESIUM: MAGNESIUM: 2.1 mg/dL (ref 1.7–2.4)

## 2017-03-23 LAB — GLUCOSE, CAPILLARY
GLUCOSE-CAPILLARY: 271 mg/dL — AB (ref 65–99)
Glucose-Capillary: 290 mg/dL — ABNORMAL HIGH (ref 65–99)
Glucose-Capillary: 310 mg/dL — ABNORMAL HIGH (ref 65–99)
Glucose-Capillary: 300 mg/dL — ABNORMAL HIGH (ref 65–99)

## 2017-03-23 LAB — C-REACTIVE PROTEIN: CRP: 13.8 mg/dL — ABNORMAL HIGH (ref <1.0)

## 2017-03-23 MED ORDER — OXYCODONE HCL 5 MG PO TABS
5.0000 mg | ORAL_TABLET | Freq: Four times a day (QID) | ORAL | Status: DC | PRN
  Administered 2017-03-23 – 2017-03-25 (×6): 5 mg via ORAL
  Filled 2017-03-23 (×6): qty 1

## 2017-03-23 MED ORDER — LACTULOSE 10 GM/15ML PO SOLN
20.0000 g | Freq: Two times a day (BID) | ORAL | Status: DC
  Administered 2017-03-23 – 2017-03-24 (×3): 20 g via ORAL
  Filled 2017-03-23 (×3): qty 30

## 2017-03-23 NOTE — Progress Notes (Signed)
Pharmacy Antibiotic Note  Kurt Reid is a 67 y.o. male admitted on 03/08/2017 with bacteremia.  Pharmacy has been consulted for Vancomycin dosing.  Plan: Day 2 Abx's, ID on board  SCr rising - will continue vanc 1250mg  IV q24 for now but will check a vanc trough tomorrow AM to make sure patient still clearing vanc and adjust dose as needed pending trough result   Height: 6\' 1"  (185.4 cm) Weight: 235 lb 3.7 oz (106.7 kg) IBW/kg (Calculated) : 79.9  Temp (24hrs), Avg:97.6 F (36.4 C), Min:97.5 F (36.4 C), Max:97.7 F (36.5 C)  Recent Labs  Lab 03/19/2017 1131 03/26/2017 1313 03/27/2017 1503 03/22/17 0659 03/22/17 0958 03/23/17 0550  WBC 30.2*  --   --  20.1*  --  18.9*  CREATININE 1.15  --   --   --  1.41* 1.59*  LATICACIDVEN  --  2.90* 2.74*  --   --   --     Estimated Creatinine Clearance: 57.8 mL/min (A) (by C-G formula based on SCr of 1.59 mg/dL (H)).    Allergies  Allergen Reactions  . Duloxetine     REACTION: memory dysfunction, dizziness  . Latex Other (See Comments)    Reaction unknown  . Levofloxacin Nausea Only  . Mercury Other (See Comments)    Reaction unknown  . Sulfonamide Derivatives Other (See Comments)    Reaction unknown    Antimicrobials this admission: Vancomycin 03/22/2017 >> Ceftriaxone 03/30/2017 >>   Dose adjustments this admission: -  Microbiology results: 1/18 Ucx >> sent 1/18 BCx 4 bottles BCID MRSA   Thank you for allowing pharmacy to be a part of this patient's care.   Hessie KnowsJustin M Mattia Osterman, PharmD, BCPS Pager [phone] 03/23/2017 8:56 AM

## 2017-03-23 NOTE — Progress Notes (Signed)
This shift pt spouse has some concerns about pt receiving Lactulose in p.m. And not getting appropriate rest. said she would address concerns with dr. Pt had very good results this shift with approx 6-8 loose stool.

## 2017-03-23 NOTE — Progress Notes (Signed)
Patient ID: Kurt Reid, male   DOB: Aug 01, 1950, 67 y.o.   MRN: [id number]  PROGRESS NOTE    SANFORD LINDBLAD  UXL:[id number] DOB: [birth year] DOA: 03/28/2017 PCP: [name], MD   Brief Narrative:  67 year old male with history of liver cirrhosis secondary to hepatitis C, diabetes, bipolar disorder sent by PCP for evaluation of abdominal pain and confusion.  Patient was admitted with leukocytosis probably from UTI and started on antibiotics.   Assessment & Plan:   Principal Problem:   Altered mental status Active Problems:   BIPOLAR AFFECTIVE DISORDER   Chronic pain syndrome   Esophageal varices (HCC)   Hepatic cirrhosis due to chronic hepatitis C infection (HCC)   DM type 2 without retinopathy (HCC)   Chronic hepatitis C (HCC)   Leukocytosis   Sepsis (HCC)   UTI (urinary tract infection)  MRSA bacteremia -Questionable cause.  Continue vancomycin.  Follow repeat blood cultures from today.  ID following -2D echo is negative for vegetation.  Patient might need TEE at some point if persistently bacteremic  Probable acute metabolic encephalopathy from bacteremia/UTI/mild hepatic encephalopathy causing altered mental status -Mental status is improving but he is still slightly confused.  Leukocytosis is improving  -Continue Rocephin and vancomycin. -CT abdomen and pelvis showed only small ascites; chest x-ray on admission did not show infiltrates  Possible UTI -Continue Rocephin.  Follow cultures  Leukocytosis -Probably second above.  Improving.  Continue antibiotics  Decompensated liver Cirrhosis with portal hypertension and mild hepatic encephalopathy -Decrease the dose of lactulose because of excessive diarrhea.  Continue propranolol.  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  No surgical intervention as per orthopedics. -fall precautions -PT evaluation  Hyponatremia -Probably from dehydration.  Improving.  Repeat labs  Thrombocytopenia - probably from cirrhosis. Monitor. No signs of bleeding.   DVT prophylaxis: SCDs Code Status: Full Family Communication: Spoke to wife at bedside Disposition Plan:Depends on clinical outcome  Consultants: ID, Orthopedics  Procedures:  Echo on 03/22/2017 Study Conclusions  - Left ventricle: The cavity size was normal. There was mild   concentric hypertrophy. Systolic function was normal. The   estimated ejection fraction was in the range of 55% to 60%. Wall   motion was normal; there were no regional wall motion   abnormalities. Left ventricular diastolic function parameters   were normal. - Left atrium: The atrium was mildly dilated. - Atrial septum: A patent foramen ovale cannot be excluded.  Impressions:  - There was no evidence of a vegetation.  Antimicrobials: Vancomycin from 03/22/17 Rocephin from 03/27/2017    Subjective: Patient seen and examined at bedside. He is more awake but still slightly confused. Denies current abdominal pain, nausea, fever.  Complains of back and hip pain.  He is having a lot of diarrhea.  Objective: Vitals:   03/22/17 0640 03/22/17 1500 03/22/17 2136 03/23/17 0504  BP: (!) 128/113 94/67 108/65 106/63  Pulse: (!) 109 83 86 79  Resp:  18 18 18   Temp:  97.7 F (36.5 C) (!) 97.5 F (36.4 C) 97.6 F (36.4 C)  TempSrc:  Oral Oral Oral  SpO2:  95% 98% 95%  Weight:    106.7 kg (235 lb 3.7 oz)  Height:  Intake/Output Summary (Last 24 hours) at 03/23/2017 1040 Last data filed at 03/23/2017 0727 Gross per 24 hour  Intake 1660 ml  Output 6 ml  Net 1654 ml   Filed Weights   03/11/2017 1115 03/22/17 0356 03/23/17 0504  Weight: 103.4 kg (228 lb) 105.7 kg (233 lb 0.4 oz)  106.7 kg (235 lb 3.7 oz)    Examination:  General exam: Appears calm and comfortable. No distress Respiratory system: Bilateral decreased breath sound at bases Cardiovascular system: S1 & S2 heard, Tachycardic Gastrointestinal system: Abdomen is slightly distended, soft and nontender. Normal bowel sounds heard. Central nervous system: Awake but slightly confused to time. No focal neurological deficits. Moving extremities Extremities: No cyanosis, clubbing; trace edema Skin: No rashes, lesions or ulcers Lymph: No cervical lymphadenopathy  Data Reviewed: I have personally reviewed following labs and imaging studies  CBC: Recent Labs  Lab 03/13/2017 1131 03/22/17 0659 03/23/17 0550  WBC 30.2* 20.1* 18.9*  NEUTROABS 27.2*  --  16.5*  HGB 13.6 12.0* 11.6*  HCT 38.6* 35.2* 34.8*  MCV 88.3 90.0 90.2  PLT 208 120* 110*   Basic Metabolic Panel: Recent Labs  Lab 03/22/2017 1131 03/22/17 0958 03/23/17 0550  NA 126* 129* 130*  K 4.4 3.9 4.1  CL 94* 98* 103  CO2 22 22 21*  GLUCOSE 320* 249* 292*  BUN 24* 35* 44*  CREATININE 1.15 1.41* 1.59*  CALCIUM 8.3* 7.8* 7.6*  MG  --   --  2.1   GFR: Estimated Creatinine Clearance: 57.8 mL/min (A) (by C-G formula based on SCr of 1.59 mg/dL (H)). Liver Function Tests: Recent Labs  Lab 04/02/2017 1131 03/22/17 0958 03/23/17 0550  AST 53* 60* 96*  ALT 28 29 37  ALKPHOS 180* 165* 204*  BILITOT 4.4* 3.7* 3.1*  PROT 6.9 5.9* 5.5*  ALBUMIN 2.4* 1.9* 1.8*   Recent Labs  Lab 03/18/2017 1131  LIPASE 30   Recent Labs  Lab 03/24/2017 1132  AMMONIA 37*   Coagulation Profile: No results for input(s): INR, PROTIME in the last 168 hours. Cardiac Enzymes: No results for input(s): CKTOTAL, CKMB, CKMBINDEX, TROPONINI in the last 168 hours. BNP (last 3 results) No results for input(s): PROBNP in the last 8760 hours. HbA1C: No results for input(s): HGBA1C in the last 72 hours. CBG: Recent Labs  Lab 03/22/17 0730 03/22/17 1215 03/22/17 1635  03/22/17 2142 03/23/17 0823  GLUCAP 249* 241* 307* 275* 271*   Lipid Profile: No results for input(s): CHOL, HDL, LDLCALC, TRIG, CHOLHDL, LDLDIRECT in the last 72 hours. Thyroid Function Tests: No results for input(s): TSH, T4TOTAL, FREET4, T3FREE, THYROIDAB in the last 72 hours. Anemia Panel: No results for input(s): VITAMINB12, FOLATE, FERRITIN, TIBC, IRON, RETICCTPCT in the last 72 hours. Sepsis Labs: Recent Labs  Lab 03/23/2017 1313 03/08/2017 1503  LATICACIDVEN 2.90* 2.74*    Recent Results (from the past 240 hour(s))  Culture, blood (routine x 2)     Status: None (Preliminary result)   Collection Time: 03/23/2017 11:31 AM  Result Value Ref Range Status   Specimen Description BLOOD BLOOD RIGHT FOREARM  Final   Special Requests   Final    BOTTLES DRAWN AEROBIC AND ANAEROBIC Blood Culture adequate volume   Culture  Setup Time   Final    GRAM POSITIVE COCCI IN BOTH AEROBIC AND ANAEROBIC BOTTLES CRITICAL RESULT CALLED TO, READ BACK BY AND VERIFIED WITHPeggyann Juba [facility] 4098 03/22/17 A BROWNING Performed at [facility], [address], [place], [place] [address]  Culture GRAM POSITIVE COCCI  Final   Report Status PENDING  Incomplete  Blood Culture ID Panel (Reflexed)     Status: Abnormal   Collection Time: 03/19/2017 11:31 AM  Result Value Ref Range Status   Enterococcus species NOT DETECTED NOT DETECTED Final   Listeria monocytogenes NOT DETECTED NOT DETECTED Final   Staphylococcus species DETECTED (A) NOT DETECTED Final    Comment: CRITICAL RESULT CALLED TO, READ BACK BY AND VERIFIED WITHPeggyann Juba PHARMD [id number] 03/22/17 A BROWNING    Staphylococcus aureus DETECTED (A) NOT DETECTED Final    Comment: Methicillin (oxacillin)-resistant Staphylococcus aureus (MRSA). MRSA is predictably resistant to beta-lactam antibiotics (except ceftaroline). Preferred therapy is vancomycin unless clinically contraindicated. Patient requires contact precautions if  hospitalized. CRITICAL  RESULT CALLED TO, READ BACK BY AND VERIFIED WITH: Peggyann Juba PHARMD [id number] 03/22/17 A BROWNING    Methicillin resistance DETECTED (A) NOT DETECTED Final    Comment: CRITICAL RESULT CALLED TO, READ BACK BY AND VERIFIED WITH: Peggyann Juba PHARMD [id number] 03/22/17 A BROWNING    Streptococcus species NOT DETECTED NOT DETECTED Final   Streptococcus agalactiae NOT DETECTED NOT DETECTED Final   Streptococcus pneumoniae NOT DETECTED NOT DETECTED Final   Streptococcus pyogenes NOT DETECTED NOT DETECTED Final   Acinetobacter baumannii NOT DETECTED NOT DETECTED Final   Enterobacteriaceae species NOT DETECTED NOT DETECTED Final   Enterobacter cloacae complex NOT DETECTED NOT DETECTED Final   Escherichia coli NOT DETECTED NOT DETECTED Final   Klebsiella oxytoca NOT DETECTED NOT DETECTED Final   Klebsiella pneumoniae NOT DETECTED NOT DETECTED Final   Proteus species NOT DETECTED NOT DETECTED Final   Serratia marcescens NOT DETECTED NOT DETECTED Final   Haemophilus influenzae NOT DETECTED NOT DETECTED Final   Neisseria meningitidis NOT DETECTED NOT DETECTED Final   Pseudomonas aeruginosa NOT DETECTED NOT DETECTED Final   Candida albicans NOT DETECTED NOT DETECTED Final   Candida glabrata NOT DETECTED NOT DETECTED Final   Candida krusei NOT DETECTED NOT DETECTED Final   Candida parapsilosis NOT DETECTED NOT DETECTED Final   Candida tropicalis NOT DETECTED NOT DETECTED Final    Comment: Performed at [facility], [address], [place], [place] [address]  Culture, blood (routine x 2)     Status: None (Preliminary result)   Collection Time: 03/05/2017  1:07 PM  Result Value Ref Range Status   Specimen Description BLOOD BLOOD LEFT FOREARM  Final   Special Requests   Final    BOTTLES DRAWN AEROBIC AND ANAEROBIC Blood Culture adequate volume   Culture  Setup Time   Final    GRAM POSITIVE COCCI IN BOTH AEROBIC AND ANAEROBIC BOTTLES CRITICAL RESULT CALLED TO, READ BACK BY AND VERIFIED WITH: Oneta Rack,  AT 4782 03/22/17 BY Renato Shin Performed at [facility], [address] [place], [place] [address]    Culture GRAM POSITIVE COCCI  Final   Report Status PENDING  Incomplete  Urine Culture     Status: Abnormal (Preliminary result)   Collection Time: 03/04/2017  3:36 PM  Result Value Ref Range Status   Specimen Description URINE, CLEAN CATCH  Final   Special Requests Normal  Final   Culture (A)  Final    >=100,000 COLONIES/mL UNIDENTIFIED ORGANISM Performed at [facility], [address], [place], [place] [address]    Report Status PENDING  Incomplete         Radiology Studies: Dg Chest 2 View  Result Date: 03/26/2017 CLINICAL DATA:  Confusion.  EXAM: CHEST  2 VIEW COMPARISON:  03/10/2017 FINDINGS: The cardiac silhouette, mediastinal and hilar contours are within normal limits and stable. Stable tortuosity of the thoracic aorta. The lungs demonstrate mild chronic bronchitic changes but no infiltrates, edema or effusions. IMPRESSION: No acute cardiopulmonary findings. Electronically Signed   By: Rudie Meyer M.D.   On: 03/25/2017 15:59   Dg Ankle Complete Left  Result Date: 03/24/2017 CLINICAL DATA:  Swelling, pain and stiffness. EXAM: LEFT ANKLE COMPLETE - 3+ VIEW COMPARISON:  None. FINDINGS: There is no fracture or dislocation. Slight degenerative changes at the ankle joint with a small ankle effusion and soft tissue swelling around the ankle. IMPRESSION: 1. Slight arthritic changes at the ankle joint with a small joint effusion and soft tissue swelling. 2. No fractures. Electronically Signed   By: Francene Boyers M.D.   On: 03/18/2017 12:59   Ct Abdomen Pelvis W Contrast  Result Date: 03/17/2017 CLINICAL DATA:  67 year old male with history of abdominal pain, confusion and hip pain. Jaundice. EXAM: CT ABDOMEN AND PELVIS WITH CONTRAST TECHNIQUE: Multidetector CT imaging of the abdomen and pelvis was performed using the standard protocol following bolus administration of  intravenous contrast. CONTRAST:  ISOVUE-300 IOPAMIDOL (ISOVUE-300) INJECTION 61% COMPARISON:  No priors. FINDINGS: Lower chest: Areas of mild cylindrical bronchiectasis are noted in the lung bases, most evident in the right middle lobe where there is also extensive thickening of the peribronchovascular interstitium and associated peribronchovascular micro and macronodularity as well as regional architectural distortion, most compatible with areas of chronic mucoid impaction in the setting of chronic post infectious or inflammatory scarring. Hepatobiliary: Liver has a shrunken appearance and nodular contour, compatible with advanced cirrhosis. Contrast bolus is suboptimal on today's examination, and with this limitation in mind there is no discrete hypervascular lesion confidently identified. No intra or extrahepatic biliary ductal dilatation. Gallbladder is normal in appearance. Pancreas: No pancreatic mass. No pancreatic ductal dilatation. No pancreatic or peripancreatic fluid or inflammatory changes. Spleen: Spleen is enlarged measuring 18.0 x 8.4 x 17.1 cm (estimated splenic volume of 1,293 mL) . Adrenals/Urinary Tract: There are multiple nonobstructive calculi throughout the collecting systems of both kidneys measuring up to 4 mm in the interpolar collecting system of the right kidney. No additional calculi are noted along the course of either ureter or within the lumen of the urinary bladder. No hydroureteronephrosis. Mild multifocal cortical thinning in the right kidney. 5.6 cm simple cyst in the interpolar region of the left kidney. No suspicious renal lesions. Urinary bladder is normal in appearance. Bilateral adrenal glands are normal in appearance. Stomach/Bowel: Normal appearance of the stomach. No pathologic dilatation of small bowel or colon. Numerous colonic diverticulae are noted, without surrounding inflammatory changes to suggest an acute diverticulitis at this time. Normal appendix.  Vascular/Lymphatic: Aortic atherosclerosis, without evidence of aneurysm or dissection in the abdominal or pelvic vasculature. Numerous portosystemic collateral pathways, most evident for large splenorenal collaterals and small proximal gastric varices. Portal vein remains patent at this time and is dilated measuring up to 18 mm in diameter in the porta hepatis. No lymphadenopathy. Reproductive: Prostate gland and seminal vesicles are unremarkable in appearance. Other: Small volume of ascites.  No pneumoperitoneum. Musculoskeletal: There are no aggressive appearing lytic or blastic lesions noted in the visualized portions of the skeleton. IMPRESSION: 1. Morphologic changes of advanced cirrhosis with evidence of portal hypertension, including dilated portal vein and splenomegaly with numerous portosystemic collateral pathways. Previously identified lesion in segment 8 is not confidently noted on today's  examination. 2. Numerous nonobstructive calculi within the collecting systems of both kidneys. No ureteral stones or findings of urinary tract obstruction are noted at this time. 3. Aortic atherosclerosis. 4. Colonic diverticulosis without evidence of acute diverticulitis at this time. Electronically Signed   By: Trudie Reed M.D.   On: 03/07/2017 14:08   Mr Hip Right Wo Contrast  Result Date: 03/22/2017 CLINICAL DATA:  Bilateral hip pain. Recent fall. Age-indeterminate fracture of the right greater trochanter on radiographs. EXAM: MR OF THE RIGHT HIP WITHOUT CONTRAST TECHNIQUE: Multiplanar, multisequence MR imaging was performed. No intravenous contrast was administered. COMPARISON:  Pelvic CT 03/09/2017, hip radiographs 03/06/2017 and right hip CT 01/28/2015. FINDINGS: Despite efforts by the technologist and patient, mild motion artifact is present on today's exam and could not be eliminated. This reduces exam sensitivity and specificity. Bones: No evidence of acute fracture, dislocation or femoral head  avascular necrosis. There is a chronic fracture of the right femoral greater trochanter which is posteriorly displaced without healing (nonunion). This was demonstrated as an acute injury on the right hip CT 01/28/2015. Lower lumbar spondylosis noted. The sacroiliac joints appear normal. Articular cartilage and labrum Articular cartilage:  Mild degenerative changes of both hips. Labrum:  No gross labral tear or paralabral cyst. Joint or bursal effusion Joint effusion:  No significant hip joint effusion. Bursae: No focal periarticular fluid collections are identified. The right hip periarticular soft tissues appear unremarkable. However, on the left, there is prominent soft tissue edema surrounding the hip, greatest posteriorly. This tracks along the gemellus and obturator internus muscles. No focal fluid collection identified. Muscles and tendons Muscles and tendons: As above, there is periarticular soft tissue edema surrounding the left hip, involving gemellus and obturator internus muscles. There is a component along the medial pelvic side wall. There is also some edema within the gluteus musculature. The left common hamstring tendon demonstrates tendinosis and partial tearing. No significant right hip tendon abnormalities are seen. The right piriformis muscle is chronically atrophied. Other findings Miscellaneous:  Mild ascites noted. IMPRESSION: 1. 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 This fracture was demonstrated as an acute injury in 2016. 2. No acute osseous findings identified. No significant hip or sacroiliac joint effusion. 3. Soft tissue edema surrounding the left hip, likely posttraumatic or potentially inflammatory. No focal abscess. The left common hamstring tendon appears partially torn, and this could be the etiology. Electronically Signed   By: Carey Bullocks M.D.   On: 03/22/2017 11:53   Dg Hips Bilat W Or Wo Pelvis 3-4 Views  Result Date: 03/20/2017 CLINICAL DATA:   Suspected hip pain with decreased motility. EXAM: DG HIP (WITH OR WITHOUT PELVIS) 3-4V BILAT COMPARISON:  None. FINDINGS: There is an age-indeterminate avulsion fracture of the right greater trochanter. Otherwise, there is no evidence of fracture or dislocation. Minimal osteoarthritic changes of the left hip. IMPRESSION: Age-indeterminate avulsion fracture of the right greater trochanter. No evidence of left hip fracture. Electronically Signed   By: Ted Mcalpine M.D.   On: 03/09/2017 12:58        Scheduled Meds: . aspirin EC  81 mg Oral Daily  . diltiazem  5 mg Intravenous Once  . gabapentin  600 mg Oral BID  . heparin  5,000 Units Subcutaneous Q8H  . insulin aspart  0-9 Units Subcutaneous TID WC  . lactulose  30 g Oral TID  . multivitamin with minerals  1 tablet Oral Daily  . pantoprazole  40 mg Oral Daily  . propranolol  20 mg Oral BID  . sodium chloride flush  3 mL Intravenous Q12H  . venlafaxine  75 mg Oral TID WC   Continuous Infusions: . sodium chloride    . sodium chloride 100 mL/hr at 03/23/17 1033  . cefTRIAXone (ROCEPHIN)  IV Stopped (03/22/17 2105)  . vancomycin Stopped (03/23/17 0727)     LOS: 2 days        Glade LloydKshitiz Geralynn Capri, MD [facility] Pager [phone]  If 7PM-7AM, please contact night-coverage [email or web address] Password [email or web address] 03/23/2017, 10:40 AM

## 2017-03-23 NOTE — Progress Notes (Signed)
Radiological studies reviewed  MRI scan shows no acute fracture of either hip Right trochanteric fracture is old Partial hamstring injury on the left No acute indication for intervention on either hip

## 2017-03-23 NOTE — Consult Note (Signed)
Reason for Consult:hip fracture Referring Physician: Dr Jetty Peeks is an 67 y.o. male.  HPI: Kurt Reid is a 67 year old patient admitted for sepsis.  Workup to date has been negative for a source.  Abdominal CT showed greater trochanteric avulsion fracture.  Subsequent MRI demonstrated this to be an old fracture.  He also has partial hamstring avulsion on the left-hand side by the pelvic MRI scan.  He denies any other orthopedic complaints.  He does state however that he twisted his ankle 4 weeks ago.  He states that it is difficult for him to walk because "my bones hurt" he denies any radicular type symptoms  Past Medical History:  Diagnosis Date  . ADHD 09/23/2006  . ADHD (attention deficit hyperactivity disorder)   . Allergic rhinitis   . ALLERGIC RHINITIS 09/23/2006  . Anxiety   . ANXIETY 04/19/2009  . Benign prostatic hypertrophy   . BENIGN PROSTATIC HYPERTROPHY 11/02/2007  . Bipolar 1 disorder ([place])   . BIPOLAR AFFECTIVE DISORDER 09/23/2006  . Chronic pain syndrome 01/18/2009  . 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 Depression   . DEPRESSION 09/23/2006  . Diabetes mellitus without complication ([place])   . Diverticulosis of colon   . DIVERTICULOSIS, COLON 04/19/2009  . ESOPHAGEAL VARICES 04/19/2009  . ETOH abuse    hx  . GERD 09/23/2006  . GERD (gastroesophageal reflux disease)   . Hepatitis C   . HEPATITIS C 09/23/2006  . Hyperlipidemia   . HYPERLIPIDEMIA 09/23/2006  . INSOMNIA-SLEEP DISORDER-UNSPEC 04/19/2009  . Low back pain    chronic  . LOW BACK PAIN 09/23/2006  . Migraine   . MRSA (methicillin resistant staph aureus) culture positive   . Nephrolithiasis    hx  . NEPHROLITHIASIS, HX OF 11/02/2007  . SEIZURE DISORDER 09/23/2006  . Seizure disorder ([place])    H/O  . Sinusitis    recurrent    Past Surgical History:  Procedure Laterality Date  . back surgury     x 5  . cheekbone     hx of fx  . I&D EXTREMITY Right 12/10/2013   Procedure: IRRIGATION  AND DEBRIDEMENT OF MULTIPLE DOG BITES RIGHT HAND AND WRIST;  Surgeon: Roseanne Kaufman, MD;  Location: [facility];  Service: Orthopedics;  Laterality: Right;  . knee surgury     x 1  . s/p sinus surgury  2010   Dr. Phebe Colla  . TONSILLECTOMY      Family History  Problem Relation Age of Onset  . Dementia Father   . Stroke Mother   . Hyperlipidemia Mother   . Depression Mother   . Hypertension Mother   . Asthma Daughter   . Diabetes Neg Hx     Social History:  reports that he has quit smoking. he has never used smokeless tobacco. He reports that he does not drink alcohol or use drugs.  Allergies:  Allergies  Allergen Reactions  . Duloxetine     REACTION: memory dysfunction, dizziness  . Latex Other (See Comments)    Reaction unknown  . Levofloxacin Nausea Only  . Mercury Other (See Comments)    Reaction unknown  . Sulfonamide Derivatives Other (See Comments)    Reaction unknown    Medications: I have reviewed the patient's current medications.  Results for orders placed or performed during the hospital encounter of 03/05/2017 (from the past 48 hour(s))  CBG monitoring, ED     Status: Abnormal   Collection Time: 03/19/2017  5:54 PM  Result Value Ref Range   Glucose-Capillary 244 (H) 65 - 99 mg/dL  Glucose, capillary     Status: Abnormal   Collection Time: 03/15/2017  9:21 PM  Result Value Ref Range   Glucose-Capillary 246 (H) 65 - 99 mg/dL  CBC     Status: Abnormal   Collection Time: 03/22/17  6:59 AM  Result Value Ref Range   WBC 20.1 (H) 4.0 - 10.5 K/uL   RBC 3.91 (L) 4.22 - 5.81 MIL/uL   Hemoglobin 12.0 (L) 13.0 - 17.0 g/dL   HCT 35.2 (L) 39.0 - 52.0 %   MCV 90.0 78.0 - 100.0 fL   MCH 30.7 26.0 - 34.0 pg   MCHC 34.1 30.0 - 36.0 g/dL   RDW 15.4 11.5 - 15.5 %   Platelets 120 (L) 150 - 400 K/uL  Glucose, capillary     Status: Abnormal   Collection Time: 03/22/17  7:30 AM  Result Value Ref Range   Glucose-Capillary 249 (H) 65 - 99 mg/dL   Comment 1 Notify RN    Comprehensive metabolic panel     Status: Abnormal   Collection Time: 03/22/17  9:58 AM  Result Value Ref Range   Sodium 129 (L) 135 - 145 mmol/L   Potassium 3.9 3.5 - 5.1 mmol/L   Chloride 98 (L) 101 - 111 mmol/L   CO2 22 22 - 32 mmol/L   Glucose, Bld 249 (H) 65 - 99 mg/dL   BUN 35 (H) 6 - 20 mg/dL   Creatinine, Ser 1.41 (H) 0.61 - 1.24 mg/dL   Calcium 7.8 (L) 8.9 - 10.3 mg/dL   Total Protein 5.9 (L) 6.5 - 8.1 g/dL   Albumin 1.9 (L) 3.5 - 5.0 g/dL   AST 60 (H) 15 - 41 U/L   ALT 29 17 - 63 U/L   Alkaline Phosphatase 165 (H) 38 - 126 U/L   Total Bilirubin 3.7 (H) 0.3 - 1.2 mg/dL   GFR calc non Af Amer 50 (L) >60 mL/min   GFR calc Af Amer 58 (L) >60 mL/min    Comment: (NOTE) The eGFR has been calculated using the CKD EPI equation. This calculation has not been validated in all clinical situations. eGFR's persistently <60 mL/min signify possible Chronic Kidney Disease.    Anion gap 9 5 - 15  Glucose, capillary     Status: Abnormal   Collection Time: 03/22/17 12:15 PM  Result Value Ref Range   Glucose-Capillary 241 (H) 65 - 99 mg/dL   Comment 1 Notify RN   Glucose, capillary     Status: Abnormal   Collection Time: 03/22/17  4:35 PM  Result Value Ref Range   Glucose-Capillary 307 (H) 65 - 99 mg/dL   Comment 1 Notify RN   Glucose, capillary     Status: Abnormal   Collection Time: 03/22/17  9:42 PM  Result Value Ref Range   Glucose-Capillary 275 (H) 65 - 99 mg/dL  CBC with Differential/Platelet     Status: Abnormal   Collection Time: 03/23/17  5:50 AM  Result Value Ref Range   WBC 18.9 (H) 4.0 - 10.5 K/uL   RBC 3.86 (L) 4.22 - 5.81 MIL/uL   Hemoglobin 11.6 (L) 13.0 - 17.0 g/dL   HCT 34.8 (L) 39.0 - 52.0 %   MCV 90.2 78.0 - 100.0 fL   MCH 30.1 26.0 - 34.0 pg   MCHC 33.3 30.0 - 36.0 g/dL   RDW 15.6 (H) 11.5 - 15.5 %   Platelets 110 (L) 150 -  400 K/uL    Comment: RESULT REPEATED AND VERIFIED SPECIMEN CHECKED FOR CLOTS CONSISTENT WITH PREVIOUS RESULT    Neutrophils  Relative % 86 %   Neutro Abs 16.5 (H) 1.7 - 7.7 K/uL   Lymphocytes Relative 5 %   Lymphs Abs 0.9 0.7 - 4.0 K/uL   Monocytes Relative 6 %   Monocytes Absolute 1.1 (H) 0.1 - 1.0 K/uL   Eosinophils Relative 2 %   Eosinophils Absolute 0.5 0.0 - 0.7 K/uL   Basophils Relative 1 %   Basophils Absolute 0.1 0.0 - 0.1 K/uL  Comprehensive metabolic panel     Status: Abnormal   Collection Time: 03/23/17  5:50 AM  Result Value Ref Range   Sodium 130 (L) 135 - 145 mmol/L   Potassium 4.1 3.5 - 5.1 mmol/L   Chloride 103 101 - 111 mmol/L   CO2 21 (L) 22 - 32 mmol/L   Glucose, Bld 292 (H) 65 - 99 mg/dL   BUN 44 (H) 6 - 20 mg/dL   Creatinine, Ser 1.59 (H) 0.61 - 1.24 mg/dL   Calcium 7.6 (L) 8.9 - 10.3 mg/dL   Total Protein 5.5 (L) 6.5 - 8.1 g/dL   Albumin 1.8 (L) 3.5 - 5.0 g/dL   AST 96 (H) 15 - 41 U/L   ALT 37 17 - 63 U/L   Alkaline Phosphatase 204 (H) 38 - 126 U/L   Total Bilirubin 3.1 (H) 0.3 - 1.2 mg/dL   GFR calc non Af Amer 43 (L) >60 mL/min   GFR calc Af Amer 50 (L) >60 mL/min    Comment: (NOTE) The eGFR has been calculated using the CKD EPI equation. This calculation has not been validated in all clinical situations. eGFR's persistently <60 mL/min signify possible Chronic Kidney Disease.    Anion gap 6 5 - 15  Magnesium     Status: None   Collection Time: 03/23/17  5:50 AM  Result Value Ref Range   Magnesium 2.1 1.7 - 2.4 mg/dL  C-reactive protein     Status: Abnormal   Collection Time: 03/23/17  5:50 AM  Result Value Ref Range   CRP 13.8 (H) <1.0 mg/dL    Comment: Performed at [facility] [address], [place],  [address]  Glucose, capillary     Status: Abnormal   Collection Time: 03/23/17  8:23 AM  Result Value Ref Range   Glucose-Capillary 271 (H) 65 - 99 mg/dL  Glucose, capillary     Status: Abnormal   Collection Time: 03/23/17 12:01 PM  Result Value Ref Range   Glucose-Capillary 290 (H) 65 - 99 mg/dL   Comment 1 Notify RN   Glucose, capillary      Status: Abnormal   Collection Time: 03/23/17  4:43 PM  Result Value Ref Range   Glucose-Capillary 310 (H) 65 - 99 mg/dL    Mr Hip Right Wo Contrast  Result Date: 03/22/2017 CLINICAL DATA:  Bilateral hip pain. Recent fall. Age-indeterminate fracture of the right greater trochanter on radiographs. EXAM: MR OF THE RIGHT HIP WITHOUT CONTRAST TECHNIQUE: Multiplanar, multisequence MR imaging was performed. No intravenous contrast was administered. COMPARISON:  Pelvic CT 03/06/2017, hip radiographs 03/16/2017 and right hip CT 01/28/2015. FINDINGS: Despite efforts by the technologist and patient, mild motion artifact is present on today's exam and could not be eliminated. This reduces exam sensitivity and specificity. Bones: No evidence of acute fracture, dislocation or femoral head avascular necrosis. There is a chronic fracture of the right femoral greater trochanter  which is posteriorly displaced without healing (nonunion). This was demonstrated as an acute injury on the right hip CT 01/28/2015. Lower lumbar spondylosis noted. The sacroiliac joints appear normal. Articular cartilage and labrum Articular cartilage:  Mild degenerative changes of both hips. Labrum:  No gross labral tear or paralabral cyst. Joint or bursal effusion Joint effusion:  No significant hip joint effusion. Bursae: No focal periarticular fluid collections are identified. The right hip periarticular soft tissues appear unremarkable. However, on the left, there is prominent soft tissue edema surrounding the hip, greatest posteriorly. This tracks along the gemellus and obturator internus muscles. No focal fluid collection identified. Muscles and tendons Muscles and tendons: As above, there is periarticular soft tissue edema surrounding the left hip, involving gemellus and obturator internus muscles. There is a component along the medial pelvic side wall. There is also some edema within the gluteus musculature. The left common hamstring tendon  demonstrates tendinosis and partial tearing. No significant right hip tendon abnormalities are seen. The right piriformis muscle is chronically atrophied. Other findings Miscellaneous:  Mild ascites noted. IMPRESSION: 1. 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 This fracture was demonstrated as an acute injury in 2016. 2. No acute osseous findings identified. No significant hip or sacroiliac joint effusion. 3. Soft tissue edema surrounding the left hip, likely posttraumatic or potentially inflammatory. No focal abscess. The left common hamstring tendon appears partially torn, and this could be the etiology. Electronically Signed   By: Richardean Sale M.D.   On: 03/22/2017 11:53    Review of Systems  Constitutional: Positive for fever and malaise/fatigue.  HENT: Negative.   Eyes: Negative.   Respiratory: Positive for cough.   Cardiovascular: Negative.   Gastrointestinal: Positive for abdominal pain.  Musculoskeletal: Positive for joint pain.  Skin: Negative.   Neurological: Positive for dizziness.  Endo/Heme/Allergies: Negative.   Psychiatric/Behavioral: Negative.    Blood pressure 104/65, pulse 83, temperature 98.4 F (36.9 C), temperature source Oral, resp. rate 18, height '6\' 1"'  (1.854 m), weight 235 lb 3.7 oz (106.7 kg), SpO2 94 %. Physical Exam  Constitutional: He appears well-developed.  HENT:  Head: Normocephalic.  Eyes: Pupils are equal, round, and reactive to light.  Neck: Normal range of motion.  Cardiovascular: Normal rate.  Respiratory: Effort normal.  Neurological: He is alert.  Skin: Skin is warm.  Psychiatric: He has a normal mood and affect.  Examination of the upper extremities demonstrates pretty reasonable grip strength bilaterally with no crepitus swelling effusion or induration of the wrist elbow or shoulder joints.  Sternoclavicular and AC joints also intact nontender without evidence of infection  Bilateral lower extremities demonstrates no  pain with internal/external rotation of either hip.  Both knees have good extension and flexion strength with no effusion.  Patient has good ankle dorsiflexion and plantar flexion strength.  He does have swelling in the left ankle joint which is not present in the right ankle joint.  That ankle also feels slightly more unstable to varus stress than the right.  Assessment/Plan: Impression is chronic trochanteric avulsion fracture on the right and no physical exam evidence of any type of hip problem.  Source of sepsis not yet identified.  I would recommend based on swelling without erythema or induration of the left ankle that plain radiographs and MRI scan be performed.  Interventional radiology may be required to aspirate the ankle if an effusion is present.  Kurt Reid 03/23/2017, 4:58 PM

## 2017-03-24 ENCOUNTER — Inpatient Hospital Stay (HOSPITAL_COMMUNITY): Payer: Medicare Other

## 2017-03-24 DIAGNOSIS — R410 Disorientation, unspecified: Secondary | ICD-10-CM

## 2017-03-24 LAB — VANCOMYCIN, TROUGH: Vancomycin Tr: 16 ug/mL (ref 15–20)

## 2017-03-24 LAB — COMPREHENSIVE METABOLIC PANEL
ALBUMIN: 1.9 g/dL — AB (ref 3.5–5.0)
ALT: 47 U/L (ref 17–63)
ANION GAP: 6 (ref 5–15)
AST: 92 U/L — ABNORMAL HIGH (ref 15–41)
Alkaline Phosphatase: 274 U/L — ABNORMAL HIGH (ref 38–126)
BILIRUBIN TOTAL: 3.5 mg/dL — AB (ref 0.3–1.2)
BUN: 42 mg/dL — ABNORMAL HIGH (ref 6–20)
CALCIUM: 8 mg/dL — AB (ref 8.9–10.3)
CO2: 18 mmol/L — AB (ref 22–32)
Chloride: 107 mmol/L (ref 101–111)
Creatinine, Ser: 1.5 mg/dL — ABNORMAL HIGH (ref 0.61–1.24)
GFR calc Af Amer: 54 mL/min — ABNORMAL LOW (ref >60)
GFR calc non Af Amer: 46 mL/min — ABNORMAL LOW (ref >60)
GLUCOSE: 265 mg/dL — AB (ref 65–99)
Potassium: 4.3 mmol/L (ref 3.5–5.1)
SODIUM: 131 mmol/L — AB (ref 135–145)
TOTAL PROTEIN: 6.2 g/dL — AB (ref 6.5–8.1)

## 2017-03-24 LAB — URINE CULTURE
Culture: >=100000 — AB
Special Requests: NORMAL

## 2017-03-24 LAB — CBC WITH DIFFERENTIAL/PLATELET
BASOS PCT: 0 %
Basophils Absolute: 0.1 10*3/uL (ref 0.0–0.1)
Eosinophils Absolute: 0.2 10*3/uL (ref 0.0–0.7)
Eosinophils Relative: 1 %
HEMATOCRIT: 35.2 % — AB (ref 39.0–52.0)
HEMOGLOBIN: 12.1 g/dL — AB (ref 13.0–17.0)
LYMPHS ABS: 0.8 10*3/uL (ref 0.7–4.0)
Lymphocytes Relative: 3 %
MCH: 30.8 pg (ref 26.0–34.0)
MCHC: 34.4 g/dL (ref 30.0–36.0)
MCV: 89.6 fL (ref 78.0–100.0)
MONOS PCT: 4 %
Monocytes Absolute: 0.9 10*3/uL (ref 0.1–1.0)
NEUTROS ABS: 22.2 10*3/uL — AB (ref 1.7–7.7)
NEUTROS PCT: 92 %
Platelets: 146 10*3/uL — ABNORMAL LOW (ref 150–400)
RBC: 3.93 MIL/uL — AB (ref 4.22–5.81)
RDW: 15.7 % — ABNORMAL HIGH (ref 11.5–15.5)
WBC: 24.2 10*3/uL — AB (ref 4.0–10.5)

## 2017-03-24 LAB — CULTURE, BLOOD (ROUTINE X 2)
SPECIAL REQUESTS: ADEQUATE
Special Requests: ADEQUATE

## 2017-03-24 LAB — GLUCOSE, CAPILLARY
GLUCOSE-CAPILLARY: 289 mg/dL — AB (ref 65–99)
Glucose-Capillary: 242 mg/dL — ABNORMAL HIGH (ref 65–99)
Glucose-Capillary: 259 mg/dL — ABNORMAL HIGH (ref 65–99)
Glucose-Capillary: 276 mg/dL — ABNORMAL HIGH (ref 65–99)

## 2017-03-24 LAB — MAGNESIUM: Magnesium: 2.1 mg/dL (ref 1.7–2.4)

## 2017-03-24 LAB — HEMOGLOBIN A1C
Hgb A1c MFr Bld: 8.7 % — ABNORMAL HIGH (ref 4.8–5.6)
MEAN PLASMA GLUCOSE: 202.99 mg/dL

## 2017-03-24 LAB — VANCOMYCIN, PEAK: VANCOMYCIN PK: 36 ug/mL (ref 30–40)

## 2017-03-24 MED ORDER — ALPRAZOLAM 0.5 MG PO TABS: 0.5000 mg | ORAL_TABLET | Freq: Once | ORAL | Status: DC

## 2017-03-24 MED ORDER — GADOBENATE DIMEGLUMINE 529 MG/ML IV SOLN: 10.0000 mL | Freq: Once | INTRAVENOUS | Status: DC | PRN

## 2017-03-24 NOTE — Progress Notes (Signed)
[facility] will follow with ID team to support home IV ABX at DC if ordered.  If patient discharges after hours, please call [phone].   Sedalia Mutaamela S Chandler 03/24/2017, 10:31 PM

## 2017-03-24 NOTE — Progress Notes (Signed)
PT Cancellation Note  Patient Details Name: Kurt BundeRichard S Reid MRN: [id number] DOB: 06/28/1950   Cancelled Treatment:    Reason Eval/Treat Not Completed: Patient declined,states that he is tired. Plans TEE 1/22.    Rada HayHill, Antwone Capozzoli Elizabeth 03/24/2017, 5:20 PM Blanchard KelchKaren Dajanay Northrup PT [phone]

## 2017-03-24 NOTE — Progress Notes (Signed)
    [facility] has been requested to perform a transesophageal echocardiogram on [name] for Bacteremia.  After careful review of history and examination, the risks and benefits of transesophageal echocardiogram have been explained including risks of esophageal damage, perforation (1:10,000 risk), bleeding, pharyngeal hematoma as well as other potential complications associated with conscious sedation including aspiration, arrhythmia, respiratory failure and death. Alternatives to treatment were discussed, questions were answered. Patient is willing to proceed.  TEE - Dr. Eden EmmsNishan  @ 1400 . NPO after midnight. Meds with sips.   Manson PasseyBhavinkumar Makinzy Cleere, PA-C 03/24/2017 2:06 PM

## 2017-03-24 NOTE — Progress Notes (Signed)
[facility] scheduled for tomorrow 03/22/2017 to pick patient [facility] at 12pm [facility]

## 2017-03-24 NOTE — Progress Notes (Signed)
Patient ID: Kurt Reid, male   DOB: June 15, 1950, 67 y.o.   MRN: [id number]  PROGRESS NOTE    DASHAN CHIZMAR  WUJ:[id number] DOB: 1950-08-12 DOA: 03/11/2017 PCP: [name], MD   Brief Narrative:  67 year old male with history of liver cirrhosis secondary to hepatitis C, diabetes, bipolar disorder sent by PCP for evaluation of abdominal pain and confusion.  Patient was admitted with leukocytosis probably from UTI and started on antibiotics.   Assessment & Plan:   Principal Problem:   Altered mental status Active Problems:   BIPOLAR AFFECTIVE DISORDER   Chronic pain syndrome   Esophageal varices (HCC)   Hepatic cirrhosis due to chronic hepatitis C infection (HCC)   DM type 2 without retinopathy (HCC)   Chronic hepatitis C (HCC)   Leukocytosis   Sepsis (HCC)   UTI (urinary tract infection)  MRSA bacteremia -Questionable cause.  Continue vancomycin.  Follow repeat blood cultures from 03/23/2017.  ID following -2D echo is negative for vegetation.  Patient might need TEE as per ID recommendations.  Probable acute metabolic encephalopathy from bacteremia/UTI/mild hepatic encephalopathy causing altered mental status -Mental status is improving but he is still slightly confused.   -Continue Rocephin and vancomycin. -CT abdomen and pelvis showed only small ascites; chest x-ray on admission did not show infiltrates  Possible UTI -Continue Rocephin.  Urine culture is  growing staph  Leukocytosis -Probably second above.  Worsening.  Continue antibiotics.  Repeat a.m. lab  Decompensated liver Cirrhosis with portal hypertension and mild hepatic encephalopathy -Continue lactulose and propranolol.  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  No surgical intervention as per orthopedics. -fall precautions -PT evaluation  Hyponatremia -Probably from dehydration.  Improving.  Repeat labs  Thrombocytopenia - probably from cirrhosis. Monitor. No signs of bleeding.   DVT prophylaxis: SCDs Code Status: Full Family Communication: None at bedside Disposition Plan:Depends on clinical outcome  Consultants: ID, Orthopedics  Procedures:  Echo on 03/22/2017 Study Conclusions  - Left ventricle: The cavity size was normal. There was mild   concentric hypertrophy. Systolic function was normal. The   estimated ejection fraction was in the range of 55% to 60%. Wall   motion was normal; there were no regional wall motion   abnormalities. Left ventricular diastolic function parameters   were normal. - Left atrium: The atrium was mildly dilated. - Atrial septum: A patent foramen ovale cannot be excluded.  Impressions:  - There was no evidence of a vegetation.  Antimicrobials: Vancomycin from 03/22/17 Rocephin from 03/15/2017    Subjective: Patient seen and examined at bedside. He is sleepy, wakes up slightly, does not answer questions, slightly confused.  No overnight fever, nausea or vomiting. Objective: Vitals:   03/23/17 0504 03/23/17 1335 03/23/17 2142 03/24/17 0445  BP: 106/63 104/65 126/67 125/70  Pulse: 79 83 88 88  Resp: 18 18 18 18   Temp: 97.6 F (36.4 C) 98.4 F (36.9 C) 98.1 F (36.7 C) 98.4 F (36.9 C)  TempSrc: Oral Oral Oral Oral  SpO2: 95% 94% 97% 96%  Weight: 106.7 kg (235 lb 3.7 oz)   108 kg (238 lb 1.6 oz)  Height:        Intake/Output Summary (Last 24 hours) at 03/24/2017 [id number]  Last data filed at 03/24/2017 9604 Gross per 24 hour  Intake 2071.67 ml  Output 10 ml  Net 2061.67 ml   Filed Weights   03/22/17 0356 03/23/17 0504 03/24/17 0445  Weight: 105.7 kg (233 lb 0.4 oz) 106.7 kg (235 lb 3.7 oz) 108 kg (238 lb 1.6 oz)     Examination:  General exam: Appears sleepy, wakes up slightly, does not answer questions, slightly confused. No distress Respiratory system: Bilateral decreased breath sound at bases Cardiovascular system: S1 & S2 heard, rate controlled Gastrointestinal system: Abdomen is slightly distended, soft and nontender. Normal bowel sounds heard. Extremities: No cyanosis, clubbing; trace edema  Data Reviewed: I have personally reviewed following labs and imaging studies  CBC: Recent Labs  Lab 03/12/2017 1131 03/22/17 0659 03/23/17 0550 03/24/17 0553  WBC 30.2* 20.1* 18.9* 24.2*  NEUTROABS 27.2*  --  16.5* 22.2*  HGB 13.6 12.0* 11.6* 12.1*  HCT 38.6* 35.2* 34.8* 35.2*  MCV 88.3 90.0 90.2 89.6  PLT 208 120* 110* 146*   Basic Metabolic Panel: Recent Labs  Lab 03/24/2017 1131 03/22/17 0958 03/23/17 0550 03/24/17 0553  NA 126* 129* 130* 131*  K 4.4 3.9 4.1 4.3  CL 94* 98* 103 107  CO2 22 22 21* 18*  GLUCOSE 320* 249* 292* 265*  BUN 24* 35* 44* 42*  CREATININE 1.15 1.41* 1.59* 1.50*  CALCIUM 8.3* 7.8* 7.6* 8.0*  MG  --   --  2.1 2.1   GFR: Estimated Creatinine Clearance: 61.6 mL/min (A) (by C-G formula based on SCr of 1.5 mg/dL (H)). Liver Function Tests: Recent Labs  Lab 03/06/2017 1131 03/22/17 0958 03/23/17 0550 03/24/17 0553  AST 53* 60* 96* 92*  ALT 28 29 37 47  ALKPHOS 180* 165* 204* 274*  BILITOT 4.4* 3.7* 3.1* 3.5*  PROT 6.9 5.9* 5.5* 6.2*  ALBUMIN 2.4* 1.9* 1.8* 1.9*   Recent Labs  Lab 03/19/2017 1131  LIPASE 30   Recent Labs  Lab 03/20/2017 1132  AMMONIA 37*   Coagulation Profile: No results for input(s): INR, PROTIME in the last 168 hours. Cardiac Enzymes: No results for input(s): CKTOTAL, CKMB, CKMBINDEX, TROPONINI in the last 168 hours. BNP (last 3 results) No results for input(s): PROBNP in the last 8760 hours. HbA1C: Recent Labs    03/24/17 0553  HGBA1C 8.7*   CBG: Recent Labs  Lab 03/23/17 0823 03/23/17 1201 03/23/17 1643 03/23/17 2140  03/24/17 0739  GLUCAP 271* 290* 310* 300* 242*   Lipid Profile: No results for input(s): CHOL, HDL, LDLCALC, TRIG, CHOLHDL, LDLDIRECT in the last 72 hours. Thyroid Function Tests: No results for input(s): TSH, T4TOTAL, FREET4, T3FREE, THYROIDAB in the last 72 hours. Anemia Panel: No results for input(s): VITAMINB12, FOLATE, FERRITIN, TIBC, IRON, RETICCTPCT in the last 72 hours. Sepsis Labs: Recent Labs  Lab 03/29/2017 1313 03/24/2017 1503  LATICACIDVEN 2.90* 2.74*    Recent Results (from the past 240 hour(s))  Culture, blood (routine x 2)     Status: Abnormal (Preliminary result)   Collection Time: 03/22/2017 11:31 AM  Result Value Ref Range Status   Specimen Description BLOOD BLOOD RIGHT FOREARM  Final   Special Requests   Final    BOTTLES DRAWN AEROBIC AND ANAEROBIC Blood Culture adequate volume   Culture  Setup Time   Final    GRAM POSITIVE COCCI IN BOTH AEROBIC AND ANAEROBIC BOTTLES CRITICAL RESULT CALLED TO, READ BACK BY AND VERIFIED WITHPeggyann Juba PHARMD [id number] 03/22/17 A BROWNING    Culture (A)  Final  STAPHYLOCOCCUS AUREUS SUSCEPTIBILITIES TO FOLLOW Performed at [facility], [address] [place], [place] [address]    Report Status PENDING  Incomplete  Blood Culture ID Panel (Reflexed)     Status: Abnormal   Collection Time: 03-Apr-2017 11:31 AM  Result Value Ref Range Status   Enterococcus species NOT DETECTED NOT DETECTED Final   Listeria monocytogenes NOT DETECTED NOT DETECTED Final   Staphylococcus species DETECTED (A) NOT DETECTED Final    Comment: CRITICAL RESULT CALLED TO, READ BACK BY AND VERIFIED WITHPeggyann Juba PHARMD [id number] 03/22/17 A BROWNING    Staphylococcus aureus DETECTED (A) NOT DETECTED Final    Comment: Methicillin (oxacillin)-resistant Staphylococcus aureus (MRSA). MRSA is predictably resistant to beta-lactam antibiotics (except ceftaroline). Preferred therapy is vancomycin unless clinically contraindicated. Patient requires contact precautions  if  hospitalized. CRITICAL RESULT CALLED TO, READ BACK BY AND VERIFIED WITH: Peggyann Juba PHARMD [id number] 03/22/17 A BROWNING    Methicillin resistance DETECTED (A) NOT DETECTED Final    Comment: CRITICAL RESULT CALLED TO, READ BACK BY AND VERIFIED WITH: Peggyann Juba PHARMD 1191 03/22/17 A BROWNING    Streptococcus species NOT DETECTED NOT DETECTED Final   Streptococcus agalactiae NOT DETECTED NOT DETECTED Final   Streptococcus pneumoniae NOT DETECTED NOT DETECTED Final   Streptococcus pyogenes NOT DETECTED NOT DETECTED Final   Acinetobacter baumannii NOT DETECTED NOT DETECTED Final   Enterobacteriaceae species NOT DETECTED NOT DETECTED Final   Enterobacter cloacae complex NOT DETECTED NOT DETECTED Final   Escherichia coli NOT DETECTED NOT DETECTED Final   Klebsiella oxytoca NOT DETECTED NOT DETECTED Final   Klebsiella pneumoniae NOT DETECTED NOT DETECTED Final   Proteus species NOT DETECTED NOT DETECTED Final   Serratia marcescens NOT DETECTED NOT DETECTED Final   Haemophilus influenzae NOT DETECTED NOT DETECTED Final   Neisseria meningitidis NOT DETECTED NOT DETECTED Final   Pseudomonas aeruginosa NOT DETECTED NOT DETECTED Final   Candida albicans NOT DETECTED NOT DETECTED Final   Candida glabrata NOT DETECTED NOT DETECTED Final   Candida krusei NOT DETECTED NOT DETECTED Final   Candida parapsilosis NOT DETECTED NOT DETECTED Final   Candida tropicalis NOT DETECTED NOT DETECTED Final    Comment: Performed at [facility], [address] [place], [place] [address]  Culture, blood (routine x 2)     Status: Abnormal (Preliminary result)   Collection Time: April 03, 2017  1:07 PM  Result Value Ref Range Status   Specimen Description BLOOD BLOOD LEFT FOREARM  Final   Special Requests   Final    BOTTLES DRAWN AEROBIC AND ANAEROBIC Blood Culture adequate volume   Culture  Setup Time   Final    GRAM POSITIVE COCCI IN BOTH AEROBIC AND ANAEROBIC BOTTLES CRITICAL RESULT CALLED TO, READ BACK BY  AND VERIFIED WITH: Oneta Rack, AT 5621 03/22/17 BY Renato Shin Performed at [facility], [address], [place], [place] [address]    Culture STAPHYLOCOCCUS AUREUS (A)  Final   Report Status PENDING  Incomplete  Urine Culture     Status: Abnormal (Preliminary result)   Collection Time: 04/02/2017  3:36 PM  Result Value Ref Range Status   Specimen Description URINE, CLEAN CATCH  Final   Special Requests Normal  Final   Culture (A)  Final    >=100,000 COLONIES/mL STAPHYLOCOCCUS AUREUS SUSCEPTIBILITIES TO FOLLOW Performed at [facility], [address], [place], [place] [address]    Report Status PENDING  Incomplete  Culture, blood (routine x 2)  Status: None (Preliminary result)   Collection Time: 03/23/17  5:50 AM  Result Value Ref Range Status   Specimen Description BLOOD LEFT HAND  Final   Special Requests IN PEDIATRIC BOTTLE Blood Culture adequate volume  Final   Culture  Setup Time   Final    GRAM POSITIVE COCCI IN PEDIATRIC BOTTLE CRITICAL RESULT CALLED TO, READ BACK BY AND VERIFIED WITH: NICK GLOGOVAC AT 0743 ON [id number] BY SJW Performed at [facility], [address], [place], [place] [address]    Culture GRAM POSITIVE COCCI  Final   Report Status PENDING  Incomplete  Culture, blood (routine x 2)     Status: None (Preliminary result)   Collection Time: 03/23/17  5:50 AM  Result Value Ref Range Status   Specimen Description BLOOD LEFT ANTECUBITAL  Final   Special Requests IN PEDIATRIC BOTTLE Blood Culture adequate volume  Final   Culture  Setup Time   Final    GRAM POSITIVE COCCI IN PEDIATRIC BOTTLE CRITICAL RESULT CALLED TO, READ BACK BY AND VERIFIED WITH: NICK GLOGOVAC PHARMD AT 0741 ON [id number] BY SJW Performed at [facility], [address], [place], [place] [address]    Culture GRAM POSITIVE COCCI  Final   Report Status PENDING  Incomplete         Radiology Studies: Dg Ankle Complete Left  Result Date: 03/23/2017 CLINICAL DATA:   67 year old male with chronic left ankle pain EXAM: LEFT ANKLE COMPLETE - 3+ VIEW COMPARISON:  Recent prior radiographs of the left ankle Apr 17, 2017 FINDINGS: Diffuse soft tissue swelling about the ankle. Probable small ankle joint effusion. No significant interval change in the 2 days since the prior radiograph. No destructive bony changes. Mild tibiotalar and fibulotalar degenerative changes. IMPRESSION: 1. Persistent diffuse soft tissue swelling and small ankle joint effusion without evidence of fracture or malalignment. 2. Mild degenerative osteoarthritis again noted. No progressive or destructive bony changes at this time. Electronically Signed   By: Malachy Moan M.D.   On: 03/23/2017 19:05   Mr Hip Right Wo Contrast  Result Date: 03/22/2017 CLINICAL DATA:  Bilateral hip pain. Recent fall. Age-indeterminate fracture of the right greater trochanter on radiographs. EXAM: MR OF THE RIGHT HIP WITHOUT CONTRAST TECHNIQUE: Multiplanar, multisequence MR imaging was performed. No intravenous contrast was administered. COMPARISON:  Pelvic CT 04/17/17, hip radiographs 2017/04/17 and right hip CT 01/28/2015. FINDINGS: Despite efforts by the technologist and patient, mild motion artifact is present on today's exam and could not be eliminated. This reduces exam sensitivity and specificity. Bones: No evidence of acute fracture, dislocation or femoral head avascular necrosis. There is a chronic fracture of the right femoral greater trochanter which is posteriorly displaced without healing (nonunion). This was demonstrated as an acute injury on the right hip CT 01/28/2015. Lower lumbar spondylosis noted. The sacroiliac joints appear normal. Articular cartilage and labrum Articular cartilage:  Mild degenerative changes of both hips. Labrum:  No gross labral tear or paralabral cyst. Joint or bursal effusion Joint effusion:  No significant hip joint effusion. Bursae: No focal periarticular fluid collections are  identified. The right hip periarticular soft tissues appear unremarkable. However, on the left, there is prominent soft tissue edema surrounding the hip, greatest posteriorly. This tracks along the gemellus and obturator internus muscles. No focal fluid collection identified. Muscles and tendons Muscles and tendons: As above, there is periarticular soft tissue edema surrounding the left hip, involving gemellus and obturator internus muscles. There is a component along the medial  pelvic side wall. There is also some edema within the gluteus musculature. The left common hamstring tendon demonstrates tendinosis and partial tearing. No significant right hip tendon abnormalities are seen. The right piriformis muscle is chronically atrophied. Other findings Miscellaneous:  Mild ascites noted. IMPRESSION: 1. 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 This fracture was demonstrated as an acute injury in 2016. 2. No acute osseous findings identified. No significant hip or sacroiliac joint effusion. 3. Soft tissue edema surrounding the left hip, likely posttraumatic or potentially inflammatory. No focal abscess. The left common hamstring tendon appears partially torn, and this could be the etiology. Electronically Signed   By: Carey BullocksWilliam  Veazey M.D.   On: 03/22/2017 11:53        Scheduled Meds: . aspirin EC  81 mg Oral Daily  . diltiazem  5 mg Intravenous Once  . gabapentin  600 mg Oral BID  . heparin  5,000 Units Subcutaneous Q8H  . insulin aspart  0-9 Units Subcutaneous TID WC  . lactulose  20 g Oral BID  . multivitamin with minerals  1 tablet Oral Daily  . pantoprazole  40 mg Oral Daily  . propranolol  20 mg Oral BID  . sodium chloride flush  3 mL Intravenous Q12H  . venlafaxine  75 mg Oral TID WC   Continuous Infusions: . sodium chloride    . sodium chloride 50 mL/hr at 03/24/17 0344  . cefTRIAXone (ROCEPHIN)  IV Stopped (03/23/17 2330)  . vancomycin Stopped (03/23/17 0727)      LOS: 3 days        Glade LloydKshitiz Danya Spearman, MD [facility] Pager [phone]  If 7PM-7AM, please contact night-coverage [email or web address] Password [email or web address] 03/24/2017, 8:28 AM

## 2017-03-24 NOTE — Progress Notes (Signed)
[facility]    Date of Admission:  04/02/2017   Total days of antibiotics 4        Day 4 vanco           ID: Kurt Reid is a 67 y.o. 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 Having 2 bm with lactulose  Medications:  . ALPRAZolam  0.5 mg Oral Once  . aspirin EC  81 mg Oral Daily  . diltiazem  5 mg Intravenous Once  . gabapentin  600 mg Oral BID  . heparin  5,000 Units Subcutaneous Q8H  . insulin aspart  0-9 Units Subcutaneous TID WC  . lactulose  20 g Oral BID  . multivitamin with minerals  1 tablet Oral Daily  . pantoprazole  40 mg Oral Daily  . propranolol  20 mg Oral BID  . sodium chloride flush  3 mL Intravenous Q12H  . venlafaxine  75 mg Oral TID WC    Objective: Vital signs in last 24 hours: Temp:  [98.1 F (36.7 C)-98.4 F (36.9 C)] 98.3 F (36.8 C) (01/21 1603) Pulse Rate:  [86-88] 86 (01/21 1603) Resp:  [18-20] 20 (01/21 1603) BP: (119-126)/(67-79) 119/79 (01/21 1603) SpO2:  [96 %-97 %] 97 % (01/21 1603) Weight:  [238 lb 1.6 oz (108 kg)] 238 lb 1.6 oz (108 kg) (01/21 0445) Physical Exam  Constitutional: He is oriented to person, place, and time. He appears well-developed and well-nourished. No distress.  HENT:  Mouth/Throat: Oropharynx is clear and moist. No oropharyngeal exudate.  Cardiovascular: Normal rate, regular rhythm and normal heart sounds. Exam reveals no gallop and no friction rub.  No murmur heard.  Pulmonary/Chest: Effort normal and breath sounds normal. No respiratory distress. He has no wheezes.  Abdominal: Soft. Bowel sounds are  normal. He exhibits no distension. There is no tenderness.  Lymphadenopathy:  He has no cervical adenopathy.  Neurological: He is alert and oriented to person, place, and time.  Skin: Skin is warm and dry. No rash noted. No erythema.  Psychiatric: He has a normal mood and affect. His behavior is normal.     Lab Results Recent Labs    03/23/17 0550 03/24/17 0553  WBC 18.9* 24.2*  HGB 11.6* 12.1*  HCT 34.8* 35.2*  NA 130* 131*  K 4.1 4.3  CL 103 107  CO2 21* 18*  BUN 44* 42*  CREATININE 1.59* 1.50*   Liver Panel Recent Labs    03/23/17 0550 03/24/17 0553  PROT 5.5* 6.2*  ALBUMIN 1.8* 1.9*  AST 96* 92*  ALT 37 47  ALKPHOS 204* 274*  BILITOT 3.1* 3.5*   Sedimentation Rate No results for input(s): ESRSEDRATE in the last 72 hours. C-Reactive Protein Recent Labs    03/23/17 0550  CRP 13.8*    Microbiology: 1/18 blood cx mrsda 1/20 blood cx still + Studies/Results: Dg Ankle Complete Left  Result Date: 03/23/2017 CLINICAL DATA:  67 year old male with chronic left ankle pain EXAM: LEFT ANKLE COMPLETE - 3+ VIEW COMPARISON:  Recent prior radiographs of the left ankle 03/23/2017 FINDINGS: Diffuse soft  tissue swelling about the ankle. Probable small ankle joint effusion. No significant interval change in the 2 days since the prior radiograph. No destructive bony changes. Mild tibiotalar and fibulotalar degenerative changes. IMPRESSION: 1. Persistent diffuse soft tissue swelling and small ankle joint effusion without evidence of fracture or malalignment. 2. Mild degenerative osteoarthritis again noted. No progressive or destructive bony changes at this time. Electronically Signed   By: Malachy MoanHeath  McCullough M.D.   On: 03/23/2017 19:05   Mr Ankle Left Wo Contrast  Result Date: 03/24/2017 CLINICAL DATA:  Ankle swelling.  Poor historian. EXAM: MRI OF THE LEFT ANKLE WITHOUT CONTRAST TECHNIQUE: Multiplanar, multisequence MR imaging of the ankle was performed. No intravenous contrast  was administered. COMPARISON:  None. FINDINGS: Patient refused to complete the exam per technologist report. Contrast-enhanced images with unable to be acquired. TENDONS Peroneal: Intact peroneus longus and peroneus brevis tendons. Posteromedial: Intact tibialis posterior, flexor hallucis longus and flexor digitorum longus tendons. Mild-to-moderate amount of fluid along the flexor digitorum longus tendons consistent with tenosynovitis. Anterior: Intact tibialis anterior, extensor hallucis longus and extensor digitorum longus tendons. Achilles: Intact. Plantar Fascia: Intact. LIGAMENTS Lateral: Intact. Medial: Intact. CARTILAGE Ankle Joint: Small joint effusion. Cartilaginous thinning of the ankle joint. Subtalar Joints/Sinus Tarsi: No joint effusion or chondral defect. Bones: Mild marrow edema of the medial and lateral malleoli. No joint dislocation or suspicious osseous lesions. Mild degenerative sub chondral cystic change and edema of the talar dome. Other: Moderate diffuse soft tissue swelling of the included leg, ankle and dorsum of the mid and included forefoot. IMPRESSION: 1. There is moderate diffuse soft tissue swelling of the included distal leg, ankle and dorsum of the mid and included forefoot. Findings are nonspecific but can be seen in third spacing of fluid, venous stasis or possibly cellulitis. No abscess collections. 2. Nonspecific mild marrow edema of the mediolateral malleoli possibly posttraumatic in etiology. Fractures are not conclusively identified. No joint dislocation is seen. 3. 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 4. Tenosynovitis of the flexor digitorum longus Electronically Signed   By: Tollie Ethavid  Kwon M.D.   On: 03/24/2017 14:16     Assessment/Plan: Complicated mrsa bacteremia = blood cx from 1/20 still positive. Concern we have not found nidus of infection. Has tee planned for tomorrow. Repeat blood cx tomorrow. Continue with vancomycin for now. If  he develops aki, will have low threshold to switch to daptomycin. For now, he is stable.   Confusion = continue with lactulose. Thought to be due to underlying infection    [facility] CenterCynthia Derrian [facility] Cell: [phone] Pager: [phone]  03/24/2017, 5:27 PM

## 2017-03-25 ENCOUNTER — Other Ambulatory Visit (HOSPITAL_COMMUNITY): Payer: Medicare Other

## 2017-03-25 ENCOUNTER — Encounter (HOSPITAL_COMMUNITY): Admission: EM | Disposition: E | Payer: Self-pay | Source: Home / Self Care | Attending: Internal Medicine

## 2017-03-25 DIAGNOSIS — Z560 Unemployment, unspecified: Secondary | ICD-10-CM

## 2017-03-25 DIAGNOSIS — F05 Delirium due to known physiological condition: Secondary | ICD-10-CM

## 2017-03-25 DIAGNOSIS — G4700 Insomnia, unspecified: Secondary | ICD-10-CM

## 2017-03-25 LAB — COMPREHENSIVE METABOLIC PANEL
ALT: 42 U/L (ref 17–63)
AST: 72 U/L — ABNORMAL HIGH (ref 15–41)
Albumin: 1.7 g/dL — ABNORMAL LOW (ref 3.5–5.0)
Alkaline Phosphatase: 278 U/L — ABNORMAL HIGH (ref 38–126)
Anion gap: 4 — ABNORMAL LOW (ref 5–15)
BUN: 44 mg/dL — AB (ref 6–20)
CALCIUM: 8.1 mg/dL — AB (ref 8.9–10.3)
CO2: 21 mmol/L — AB (ref 22–32)
CREATININE: 1.32 mg/dL — AB (ref 0.61–1.24)
Chloride: 108 mmol/L (ref 101–111)
GFR calc Af Amer: >60 mL/min (ref >60)
GFR, EST NON AFRICAN AMERICAN: 54 mL/min — AB (ref >60)
GLUCOSE: 276 mg/dL — AB (ref 65–99)
Potassium: 4.5 mmol/L (ref 3.5–5.1)
Sodium: 133 mmol/L — ABNORMAL LOW (ref 135–145)
TOTAL PROTEIN: 6.2 g/dL — AB (ref 6.5–8.1)
Total Bilirubin: 4.1 mg/dL — ABNORMAL HIGH (ref 0.3–1.2)

## 2017-03-25 LAB — CBC WITH DIFFERENTIAL/PLATELET
BASOS ABS: 0.1 10*3/uL (ref 0.0–0.1)
Basophils Relative: 1 %
EOS ABS: 0.4 10*3/uL (ref 0.0–0.7)
EOS PCT: 3 %
HCT: 36.3 % — ABNORMAL LOW (ref 39.0–52.0)
Hemoglobin: 12.4 g/dL — ABNORMAL LOW (ref 13.0–17.0)
LYMPHS ABS: 1.3 10*3/uL (ref 0.7–4.0)
LYMPHS PCT: 8 %
MCH: 30.7 pg (ref 26.0–34.0)
MCHC: 34.2 g/dL (ref 30.0–36.0)
MCV: 89.9 fL (ref 78.0–100.0)
MONO ABS: 1.3 10*3/uL — AB (ref 0.1–1.0)
Monocytes Relative: 8 %
Neutro Abs: 13.7 10*3/uL — ABNORMAL HIGH (ref 1.7–7.7)
Neutrophils Relative %: 80 %
PLATELETS: 137 10*3/uL — AB (ref 150–400)
RBC: 4.04 MIL/uL — ABNORMAL LOW (ref 4.22–5.81)
RDW: 15.9 % — AB (ref 11.5–15.5)
WBC: 16.8 10*3/uL — ABNORMAL HIGH (ref 4.0–10.5)

## 2017-03-25 LAB — MAGNESIUM: Magnesium: 2.2 mg/dL (ref 1.7–2.4)

## 2017-03-25 LAB — GLUCOSE, CAPILLARY
GLUCOSE-CAPILLARY: 242 mg/dL — AB (ref 65–99)
Glucose-Capillary: 242 mg/dL — ABNORMAL HIGH (ref 65–99)
Glucose-Capillary: 241 mg/dL — ABNORMAL HIGH (ref 65–99)
Glucose-Capillary: 212 mg/dL — ABNORMAL HIGH (ref 65–99)

## 2017-03-25 LAB — VANCOMYCIN, TROUGH: Vancomycin Tr: 18 ug/mL (ref 15–20)

## 2017-03-25 LAB — MRSA PCR SCREENING: MRSA by PCR: POSITIVE — AB

## 2017-03-25 SURGERY — ECHOCARDIOGRAM, TRANSESOPHAGEAL
Anesthesia: Moderate Sedation

## 2017-03-25 MED ORDER — VENLAFAXINE HCL 75 MG PO TABS
75.0000 mg | ORAL_TABLET | Freq: Once | ORAL | Status: AC
  Administered 2017-03-25: 75 mg via ORAL
  Filled 2017-03-25: qty 1

## 2017-03-25 MED ORDER — CHLORHEXIDINE GLUCONATE CLOTH 2 % EX PADS
6.0000 | MEDICATED_PAD | Freq: Every day | CUTANEOUS | Status: AC
  Administered 2017-03-25 – 2017-03-29 (×5): 6 via TOPICAL

## 2017-03-25 MED ORDER — INSULIN ASPART 100 UNIT/ML ~~LOC~~ SOLN: 5.0000 [IU] | Freq: Three times a day (TID) | SUBCUTANEOUS | Status: DC

## 2017-03-25 MED ORDER — VANCOMYCIN HCL 10 G IV SOLR
1500.0000 mg | INTRAVENOUS | Status: DC
  Administered 2017-03-26 – 2017-03-28 (×3): 1500 mg via INTRAVENOUS
  Filled 2017-03-25 (×3): qty 1500

## 2017-03-25 MED ORDER — OXYCODONE HCL 5 MG PO TABS
5.0000 mg | ORAL_TABLET | ORAL | Status: DC | PRN
  Administered 2017-03-25 – 2017-03-28 (×11): 5 mg via ORAL
  Filled 2017-03-25 (×11): qty 1

## 2017-03-25 MED ORDER — VENLAFAXINE HCL 75 MG PO TABS
75.0000 mg | ORAL_TABLET | Freq: Every day | ORAL | Status: DC
  Administered 2017-03-25 – 2017-03-29 (×5): 75 mg via ORAL
  Filled 2017-03-25 (×9): qty 1

## 2017-03-25 MED ORDER — INSULIN DETEMIR 100 UNIT/ML ~~LOC~~ SOLN
10.0000 [IU] | Freq: Every day | SUBCUTANEOUS | Status: DC
  Filled 2017-03-25: qty 0.1

## 2017-03-25 MED ORDER — LACTULOSE 10 GM/15ML PO SOLN
30.0000 g | Freq: Two times a day (BID) | ORAL | Status: DC
  Administered 2017-03-25: 30 g via ORAL
  Filled 2017-03-25: qty 45

## 2017-03-25 MED ORDER — VENLAFAXINE HCL 75 MG PO TABS
150.0000 mg | ORAL_TABLET | Freq: Every day | ORAL | Status: DC
  Administered 2017-03-26 – 2017-03-29 (×4): 150 mg via ORAL
  Filled 2017-03-25 (×10): qty 2

## 2017-03-25 MED ORDER — INSULIN DETEMIR 100 UNIT/ML ~~LOC~~ SOLN
15.0000 [IU] | Freq: Every day | SUBCUTANEOUS | Status: DC
  Administered 2017-03-25 – 2017-04-01 (×8): 15 [IU] via SUBCUTANEOUS
  Filled 2017-03-25 (×10): qty 0.15

## 2017-03-25 MED ORDER — INSULIN ASPART 100 UNIT/ML ~~LOC~~ SOLN
0.0000 [IU] | Freq: Three times a day (TID) | SUBCUTANEOUS | Status: DC
  Administered 2017-03-25: 5 [IU] via SUBCUTANEOUS
  Administered 2017-03-26: 2 [IU] via SUBCUTANEOUS
  Administered 2017-03-26: 3 [IU] via SUBCUTANEOUS
  Administered 2017-03-26 – 2017-03-27 (×3): 2 [IU] via SUBCUTANEOUS
  Administered 2017-03-27: 1 [IU] via SUBCUTANEOUS
  Administered 2017-03-28: 2 [IU] via SUBCUTANEOUS
  Administered 2017-03-29 – 2017-03-30 (×3): 3 [IU] via SUBCUTANEOUS
  Administered 2017-03-31: 5 [IU] via SUBCUTANEOUS
  Administered 2017-03-31 – 2017-04-01 (×3): 3 [IU] via SUBCUTANEOUS
  Administered 2017-04-01 (×2): 2 [IU] via SUBCUTANEOUS

## 2017-03-25 MED ORDER — INSULIN ASPART 100 UNIT/ML ~~LOC~~ SOLN
0.0000 [IU] | Freq: Every day | SUBCUTANEOUS | Status: DC
  Administered 2017-03-25 – 2017-03-30 (×2): 2 [IU] via SUBCUTANEOUS

## 2017-03-25 MED ORDER — MUPIROCIN 2 % EX OINT
1.0000 "application " | TOPICAL_OINTMENT | Freq: Two times a day (BID) | CUTANEOUS | Status: AC
  Administered 2017-03-25 – 2017-03-29 (×10): 1 via NASAL
  Filled 2017-03-25: qty 22

## 2017-03-25 NOTE — Progress Notes (Signed)
Patient ID: Edwena Bunde, male   DOB: 09/04/1950, 67 y.o.   MRN: [id number]  PROGRESS NOTE    MAKOTO SELLITTO  YQM:[id number] DOB: 1950-10-02 DOA: 03/30/2017 PCP: [name], MD   Brief Narrative:  67 year old male with history of liver cirrhosis secondary to hepatitis C, diabetes, bipolar disorder sent by PCP for evaluation of abdominal pain and confusion.  Patient was admitted with leukocytosis probably from UTI and started on antibiotics.  He was found to have MRSA bacteremia.  ID was consulted.   Assessment & Plan:   Principal Problem:   Altered mental status Active Problems:   BIPOLAR AFFECTIVE DISORDER   Chronic pain syndrome   Esophageal varices (HCC)   Hepatic cirrhosis due to chronic hepatitis C infection (HCC)   DM type 2 without retinopathy (HCC)   Chronic hepatitis C (HCC)   Leukocytosis   Sepsis (HCC)   UTI (urinary tract infection)  MRSA bacteremia -Questionable cause.  Continue vancomycin.  Repeat blood cultures from 03/23/2017 is still positive.  ID following.  Repeat blood cultures for tomorrow -2D echo is negative for vegetation.  TEE scheduled for today   Probable acute metabolic encephalopathy from bacteremia/UTI/mild hepatic encephalopathy causing altered mental status -Mental status is improving but he is still slightly confused.   -CT abdomen and pelvis showed only small ascites; chest x-ray on admission did not show infiltrates  Possible UTI -Urine cultures grew staph.  Continue vancomycin  Leukocytosis -Probably secondary to above.  Improving.  Continue antibiotics.  Repeat a.m. lab  Decompensated liver Cirrhosis with portal hypertension and mild hepatic encephalopathy -Continue lactulose and propranolol.  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  No surgical intervention as per orthopedics. -fall precautions -PT evaluation  Hyponatremia -Probably from dehydration.  Improving.  Repeat a.m. labs  Thrombocytopenia - probably from cirrhosis. Monitor. No signs of bleeding.   DVT prophylaxis: SCDs Code Status: Full Family Communication: None at bedside Disposition Plan:Depends on clinical outcome  Consultants: ID, Orthopedics  Procedures:  Echo on 03/22/2017 Study Conclusions  - Left ventricle: The cavity size was normal. There was mild   concentric hypertrophy. Systolic function was normal. The   estimated ejection fraction was in the range of 55% to 60%. Wall   motion was normal; there were no regional wall motion   abnormalities. Left ventricular diastolic function parameters   were normal. - Left atrium: The atrium was mildly dilated. - Atrial septum: A patent foramen ovale cannot be excluded.  Impressions:  - There was no evidence of a vegetation.  Antimicrobials: Vancomycin from 03/22/17 Rocephin 03/05/2017-03/24/2017   Subjective: Patient seen and examined at bedside. He is sleepy, wakes up slightly, does not answer much questions, slightly confused.  No overnight fever, nausea or vomiting. Objective: Vitals:   03/24/17 0832 03/24/17 1603 03/24/17 2116 10-Apr-2017 0608  BP: 121/68 119/79 129/77 128/79  Pulse: 87 86 90 85  Resp:  20 (!) 22 20  Temp:  98.3 F (36.8 C) 99.1 F (37.3 C) 97.8 F (36.6 C)  TempSrc:  Oral Oral Axillary  SpO2:  97% 96% 94%  Weight:    108.6 kg (239 lb 6.7 oz)  Height:        Intake/Output Summary (Last  24 hours) at 04-16-17 1022 Last data filed at 03/24/2017 1851 Gross per 24 hour  Intake 350 ml  Output 2 ml  Net 348 ml   Filed Weights   03/23/17 0504 03/24/17 0445 2017/04/16 0608  Weight: 106.7 kg (235 lb 3.7 oz) 108 kg (238 lb 1.6 oz) 108.6 kg (239 lb 6.7 oz)     Examination:  General exam: Appears sleepy, wakes up slightly, does not answer much questions, slightly confused. No distress Respiratory system: Bilateral decreased breath sound at bases Cardiovascular system: S1 & S2 heard, rate controlled Gastrointestinal system: Abdomen is slightly distended, soft and nontender. Normal bowel sounds heard. Extremities: No cyanosis, clubbing; trace edema more in the left ankle  Data Reviewed: I have personally reviewed following labs and imaging studies  CBC: Recent Labs  Lab 03/23/2017 1131 03/22/17 0659 03/23/17 0550 03/24/17 0553 Apr 16, 2017 0730  WBC 30.2* 20.1* 18.9* 24.2* 16.8*  NEUTROABS 27.2*  --  16.5* 22.2* 13.7*  HGB 13.6 12.0* 11.6* 12.1* 12.4*  HCT 38.6* 35.2* 34.8* 35.2* 36.3*  MCV 88.3 90.0 90.2 89.6 89.9  PLT 208 120* 110* 146* 137*   Basic Metabolic Panel: Recent Labs  Lab 03/20/2017 1131 03/22/17 0958 03/23/17 0550 03/24/17 0553 2017-04-16 0730  NA 126* 129* 130* 131* 133*  K 4.4 3.9 4.1 4.3 4.5  CL 94* 98* 103 107 108  CO2 22 22 21* 18* 21*  GLUCOSE 320* 249* 292* 265* 276*  BUN 24* 35* 44* 42* 44*  CREATININE 1.15 1.41* 1.59* 1.50* 1.32*  CALCIUM 8.3* 7.8* 7.6* 8.0* 8.1*  MG  --   --  2.1 2.1 2.2   GFR: Estimated Creatinine Clearance: 70.2 mL/min (A) (by C-G formula based on SCr of 1.32 mg/dL (H)). Liver Function Tests: Recent Labs  Lab 03/14/2017 1131 03/22/17 0958 03/23/17 0550 03/24/17 0553 04/16/17 0730  AST 53* 60* 96* 92* 72*  ALT 28 29 37 47 42  ALKPHOS 180* 165* 204* 274* 278*  BILITOT 4.4* 3.7* 3.1* 3.5* 4.1*  PROT 6.9 5.9* 5.5* 6.2* 6.2*  ALBUMIN 2.4* 1.9* 1.8* 1.9* 1.7*   Recent Labs  Lab 03/24/2017 1131  LIPASE 30   Recent Labs  Lab 03/29/2017 1132  AMMONIA 37*   Coagulation Profile: No results for input(s): INR, PROTIME in the last 168 hours. Cardiac Enzymes: No results for input(s): CKTOTAL, CKMB, CKMBINDEX, TROPONINI in the last 168 hours. BNP (last 3 results) No results for  input(s): PROBNP in the last 8760 hours. HbA1C: Recent Labs    03/24/17 0553  HGBA1C 8.7*   CBG: Recent Labs  Lab 03/24/17 0739 03/24/17 1222 03/24/17 1750 03/24/17 2115 04-16-2017 0750  GLUCAP 242* 289* 259* 276* 242*   Lipid Profile: No results for input(s): CHOL, HDL, LDLCALC, TRIG, CHOLHDL, LDLDIRECT in the last 72 hours. Thyroid Function Tests: No results for input(s): TSH, T4TOTAL, FREET4, T3FREE, THYROIDAB in the last 72 hours. Anemia Panel: No results for input(s): VITAMINB12, FOLATE, FERRITIN, TIBC, IRON, RETICCTPCT in the last 72 hours. Sepsis Labs: Recent Labs  Lab 03/12/2017 1313 03/07/2017 1503  LATICACIDVEN 2.90* 2.74*    Recent Results (from the past 240 hour(s))  Culture, blood (routine x 2)     Status: Abnormal   Collection Time: 03/28/2017 11:31 AM  Result Value Ref Range Status   Specimen Description BLOOD BLOOD RIGHT FOREARM  Final   Special Requests   Final    BOTTLES DRAWN AEROBIC AND ANAEROBIC Blood Culture adequate volume   Culture  Setup Time   Final  GRAM POSITIVE COCCI IN BOTH AEROBIC AND ANAEROBIC BOTTLES CRITICAL RESULT CALLED TO, READ BACK BY AND VERIFIED WITHPeggyann Juba: J [facility] [phone] 03/22/17 A BROWNING Performed at [facility], [address] [place], [place] [address]    Culture METHICILLIN RESISTANT STAPHYLOCOCCUS AUREUS (A)  Final   Report Status 03/24/2017 FINAL  Final   Organism ID, Bacteria METHICILLIN RESISTANT STAPHYLOCOCCUS AUREUS  Final      Susceptibility   Methicillin resistant staphylococcus aureus - MIC*    CIPROFLOXACIN <=0.5 SENSITIVE Sensitive     ERYTHROMYCIN >=8 RESISTANT Resistant     GENTAMICIN <=0.5 SENSITIVE Sensitive     OXACILLIN RESISTANT Resistant     TETRACYCLINE <=1 SENSITIVE Sensitive     VANCOMYCIN <=0.5 SENSITIVE Sensitive     TRIMETH/SULFA <=10 SENSITIVE Sensitive     CLINDAMYCIN <=0.25 SENSITIVE Sensitive     RIFAMPIN <=0.5 SENSITIVE Sensitive     Inducible Clindamycin NEGATIVE Sensitive     *  METHICILLIN RESISTANT STAPHYLOCOCCUS AUREUS  Blood Culture ID Panel (Reflexed)     Status: Abnormal   Collection Time: Dec 25, 2017 11:31 AM  Result Value Ref Range Status   Enterococcus species NOT DETECTED NOT DETECTED Final   Listeria monocytogenes NOT DETECTED NOT DETECTED Final   Staphylococcus species DETECTED (A) NOT DETECTED Final    Comment: CRITICAL RESULT CALLED TO, READ BACK BY AND VERIFIED WITHPeggyann Juba: J GRIMSLEY PHARMD [phone] 03/22/17 A BROWNING    Staphylococcus aureus DETECTED (A) NOT DETECTED Final    Comment: Methicillin (oxacillin)-resistant Staphylococcus aureus (MRSA). MRSA is predictably resistant to beta-lactam antibiotics (except ceftaroline). Preferred therapy is vancomycin unless clinically contraindicated. Patient requires contact precautions if  hospitalized. CRITICAL RESULT CALLED TO, READ BACK BY AND VERIFIED WITH: Peggyann JubaJ GRIMSLEY PHARMD [phone] 03/22/17 A BROWNING    Methicillin resistance DETECTED (A) NOT DETECTED Final    Comment: CRITICAL RESULT CALLED TO, READ BACK BY AND VERIFIED WITH: Peggyann JubaJ GRIMSLEY PHARMD [phone] 03/22/17 A BROWNING    Streptococcus species NOT DETECTED NOT DETECTED Final   Streptococcus agalactiae NOT DETECTED NOT DETECTED Final   Streptococcus pneumoniae NOT DETECTED NOT DETECTED Final   Streptococcus pyogenes NOT DETECTED NOT DETECTED Final   Acinetobacter baumannii NOT DETECTED NOT DETECTED Final   Enterobacteriaceae species NOT DETECTED NOT DETECTED Final   Enterobacter cloacae complex NOT DETECTED NOT DETECTED Final   Escherichia coli NOT DETECTED NOT DETECTED Final   Klebsiella oxytoca NOT DETECTED NOT DETECTED Final   Klebsiella pneumoniae NOT DETECTED NOT DETECTED Final   Proteus species NOT DETECTED NOT DETECTED Final   Serratia marcescens NOT DETECTED NOT DETECTED Final   Haemophilus influenzae NOT DETECTED NOT DETECTED Final   Neisseria meningitidis NOT DETECTED NOT DETECTED Final   Pseudomonas aeruginosa NOT DETECTED NOT DETECTED Final   Candida  albicans NOT DETECTED NOT DETECTED Final   Candida glabrata NOT DETECTED NOT DETECTED Final   Candida krusei NOT DETECTED NOT DETECTED Final   Candida parapsilosis NOT DETECTED NOT DETECTED Final   Candida tropicalis NOT DETECTED NOT DETECTED Final    Comment: Performed at [facility], [address] [place], [place] [address]  Culture, blood (routine x 2)     Status: Abnormal   Collection Time: Dec 25, 2017  1:07 PM  Result Value Ref Range Status   Specimen Description BLOOD BLOOD LEFT FOREARM  Final   Special Requests   Final    BOTTLES DRAWN AEROBIC AND ANAEROBIC Blood Culture adequate volume   Culture  Setup Time   Final  GRAM POSITIVE COCCI IN BOTH AEROBIC AND ANAEROBIC BOTTLES CRITICAL RESULT CALLED TO, READ BACK BY AND VERIFIED WITH: Sophronia Simas PHARMD, AT 1610 03/22/17 BY D. VANHOOK    Culture (A)  Final    STAPHYLOCOCCUS AUREUS SUSCEPTIBILITIES PERFORMED ON PREVIOUS CULTURE WITHIN THE LAST 5 DAYS. Performed at [facility], [address], [place], [place] [address]    Report Status 03/24/2017 FINAL  Final  Urine Culture     Status: Abnormal   Collection Time: 03/16/2017  3:36 PM  Result Value Ref Range Status   Specimen Description URINE, CLEAN CATCH  Final   Special Requests Normal  Final   Culture (A)  Final    >=100,000 COLONIES/mL METHICILLIN RESISTANT STAPHYLOCOCCUS AUREUS   Report Status 03/24/2017 FINAL  Final   Organism ID, Bacteria METHICILLIN RESISTANT STAPHYLOCOCCUS AUREUS (A)  Final      Susceptibility   Methicillin resistant staphylococcus aureus - MIC*    CIPROFLOXACIN <=0.5 SENSITIVE Sensitive     GENTAMICIN <=0.5 SENSITIVE Sensitive     NITROFURANTOIN <=16 SENSITIVE Sensitive     OXACILLIN >=4 RESISTANT Resistant     TETRACYCLINE <=1 SENSITIVE Sensitive     VANCOMYCIN <=0.5 SENSITIVE Sensitive     TRIMETH/SULFA <=10 SENSITIVE Sensitive     CLINDAMYCIN <=0.25 SENSITIVE Sensitive     RIFAMPIN <=0.5 SENSITIVE Sensitive     Inducible Clindamycin  NEGATIVE Sensitive     * >=100,000 COLONIES/mL METHICILLIN RESISTANT STAPHYLOCOCCUS AUREUS  Culture, blood (routine x 2)     Status: Abnormal (Preliminary result)   Collection Time: 03/23/17  5:50 AM  Result Value Ref Range Status   Specimen Description BLOOD LEFT HAND  Final   Special Requests IN PEDIATRIC BOTTLE Blood Culture adequate volume  Final   Culture  Setup Time   Final    GRAM POSITIVE COCCI IN PEDIATRIC BOTTLE CRITICAL RESULT CALLED TO, READ BACK BY AND VERIFIED WITH: NICK GLOGOVAC AT 0743 ON [id number] BY SJW Performed at [facility], [address] [place], [place] [address]    Culture STAPHYLOCOCCUS AUREUS (A)  Final   Report Status PENDING  Incomplete  Culture, blood (routine x 2)     Status: Abnormal (Preliminary result)   Collection Time: 03/23/17  5:50 AM  Result Value Ref Range Status   Specimen Description BLOOD LEFT ANTECUBITAL  Final   Special Requests IN PEDIATRIC BOTTLE Blood Culture adequate volume  Final   Culture  Setup Time   Final    GRAM POSITIVE COCCI IN PEDIATRIC BOTTLE CRITICAL RESULT CALLED TO, READ BACK BY AND VERIFIED WITH: NICK GLOGOVAC PHARMD AT 0741 ON [id number] BY SJW Performed at [facility], [address], [place], [place] [address]    Culture STAPHYLOCOCCUS AUREUS (A)  Final   Report Status PENDING  Incomplete         Radiology Studies: Dg Ankle Complete Left  Result Date: 03/23/2017 CLINICAL DATA:  67 year old male with chronic left ankle pain EXAM: LEFT ANKLE COMPLETE - 3+ VIEW COMPARISON:  Recent prior radiographs of the left ankle 03/11/2017 FINDINGS: Diffuse soft tissue swelling about the ankle. Probable small ankle joint effusion. No significant interval change in the 2 days since the prior radiograph. No destructive bony changes. Mild tibiotalar and fibulotalar degenerative changes. IMPRESSION: 1. Persistent diffuse soft tissue swelling and small ankle joint effusion without evidence of fracture or malalignment. 2. Mild  degenerative osteoarthritis again noted. No progressive or destructive bony changes at this time. Electronically Signed  By: Malachy Moan M.D.   On: 03/23/2017 19:05   Mr Ankle Left Wo Contrast  Result Date: 03/24/2017 CLINICAL DATA:  Ankle swelling.  Poor historian. EXAM: MRI OF THE LEFT ANKLE WITHOUT CONTRAST TECHNIQUE: Multiplanar, multisequence MR imaging of the ankle was performed. No intravenous contrast was administered. COMPARISON:  None. FINDINGS: Patient refused to complete the exam per technologist report. Contrast-enhanced images with unable to be acquired. TENDONS Peroneal: Intact peroneus longus and peroneus brevis tendons. Posteromedial: Intact tibialis posterior, flexor hallucis longus and flexor digitorum longus tendons. Mild-to-moderate amount of fluid along the flexor digitorum longus tendons consistent with tenosynovitis. Anterior: Intact tibialis anterior, extensor hallucis longus and extensor digitorum longus tendons. Achilles: Intact. Plantar Fascia: Intact. LIGAMENTS Lateral: Intact. Medial: Intact. CARTILAGE Ankle Joint: Small joint effusion. Cartilaginous thinning of the ankle joint. Subtalar Joints/Sinus Tarsi: No joint effusion or chondral defect. Bones: Mild marrow edema of the medial and lateral malleoli. No joint dislocation or suspicious osseous lesions. Mild degenerative sub chondral cystic change and edema of the talar dome. Other: Moderate diffuse soft tissue swelling of the included leg, ankle and dorsum of the mid and included forefoot. IMPRESSION: 1. There is moderate diffuse soft tissue swelling of the included distal leg, ankle and dorsum of the mid and included forefoot. Findings are nonspecific but can be seen in third spacing of fluid, venous stasis or possibly cellulitis. No abscess collections. 2. Nonspecific mild marrow edema of the mediolateral malleoli possibly posttraumatic in etiology. Fractures are not conclusively identified. No joint dislocation is seen.  3. 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 4. Tenosynovitis of the flexor digitorum longus Electronically Signed   By: Tollie Eth M.D.   On: 03/24/2017 14:16        Scheduled Meds: . ALPRAZolam  0.5 mg Oral Once  . aspirin EC  81 mg Oral Daily  . diltiazem  5 mg Intravenous Once  . gabapentin  600 mg Oral BID  . heparin  5,000 Units Subcutaneous Q8H  . insulin aspart  0-9 Units Subcutaneous TID WC  . lactulose  20 g Oral BID  . multivitamin with minerals  1 tablet Oral Daily  . pantoprazole  40 mg Oral Daily  . propranolol  20 mg Oral BID  . sodium chloride flush  3 mL Intravenous Q12H  . venlafaxine  75 mg Oral TID WC   Continuous Infusions: . sodium chloride    . vancomycin Stopped (03/26/2017 1004)     LOS: 4 days        Glade Lloyd, MD [facility] Pager [phone]  If 7PM-7AM, please contact night-coverage [email or web address] Password [facility] 03/11/2017, 10:22 AM

## 2017-03-25 NOTE — Care Management Note (Signed)
Case Management Note  Patient Details  Name: Edwena BundeRichard S Urick MRN: [id number] Date of Birth: 1950-07-09  Subjective/Objective:                  Cellulitis and infection of the left ankle  Action/Plan: Date: March 22, 2017 Marcelle SmilingRhonda Davis, BSN, [place]3, [place] [phone] Chart and notes review for patient progress and needs. Will follow for case management and discharge needs. No cm or discharge needs present at time of this review. Next review date: [id number]  Expected Discharge Date:  (unknown)               Expected Discharge Plan:  Home/Self Care  In-House Referral:     Discharge planning Services  CM Consult  Post Acute Care Choice:    Choice offered to:     DME Arranged:    DME Agency:     HH Arranged:    HH Agency:     Status of Service:  In process, will continue to follow  If discussed at Long Length of Stay Meetings, dates discussed:    Additional Comments:  Golda AcreDavis, Rhonda Lynn, RN 03/05/2017, 9:17 AM

## 2017-03-25 NOTE — Progress Notes (Signed)
Inpatient Diabetes Program Recommendations  AACE/ADA: New Consensus Statement on Inpatient Glycemic Control (2015)  Target Ranges:  Prepandial:   less than 140 mg/dL      Peak postprandial:   less than 180 mg/dL (1-2 hours)      Critically ill patients:  140 - 180 mg/dL   Lab Results  Component Value Date   GLUCAP 242 (H) 03/28/2017   HGBA1C 8.7 (H) 03/24/2017    Review of Glycemic Control  Diabetes history: DM2 Outpatient Diabetes medications:  Current orders for Inpatient glycemic control: Levemir 10 units QHS, Novolog 0-15 units tidwc and hs  HgbA1C - 8.7%. May benefit from tighter glycemic control.  Inpatient Diabetes Program Recommendations:     Increase Levemir to 15 units QHS Add Novolog 5 units tidwc for meal coverage insulin.  Will follow closely.  Thank you. Ailene Ardshonda Ian Castagna, RD, LDN, CDE Inpatient Diabetes Coordinator [phone]

## 2017-03-25 NOTE — Care Management Important Message (Signed)
Important Message  Patient Details IM Letter given to Rhonda/Case Manager to present to Patient Name: Kurt BundeRichard S Stecklein MRN: [id number] Date of Birth: [birth year]   Medicare Important Message Given:  Yes    [name], [name] 03/24/2017, 10:57 AM

## 2017-03-25 NOTE — Progress Notes (Addendum)
Pharmacy Antibiotic Note  Kurt Reid is a 67 y.o. male admitted on 03/14/2017 with bacteremia.  Pharmacy has been consulted for Vancomycin dosing.  Today, 2017/05/06 Day #5 vancomycin  Bcx with persistent SA growth.  Vancomycin MIC = 0.5 mcg/mL  Renal: SCr improved  WBC trending down  No fevers   For TEE today a source of bacteremia remains unknown  Vancomycin levels returned (calculated AUC = 661), goal 400-650.    Plan:  Based on vancomycin Peak and Trough with resulting AUC, adjust vancomycin to 1500mg  IV q24h.    AUC = 567, peak = 35, trough = 15  Renal function is improving so will not make as large of dose reduction as anticipate further improvement in renal function  Monitor renal function  Monitor weekly vancomycin level   Height: 6\' 1"  (185.4 cm) Weight: 239 lb 6.7 oz (108.6 kg) IBW/kg (Calculated) : 79.9  Temp (24hrs), Avg:98.4 F (36.9 C), Min:97.8 F (36.6 C), Max:99.1 F (37.3 C)  Recent Labs  Lab 03/20/2017 1131 03/27/2017 1313 03/20/2017 1503 03/22/17 0659 03/22/17 0958 03/23/17 0550 03/24/17 0553 03/24/17 1304 10/26/17 0730  WBC 30.2*  --   --  20.1*  --  18.9* 24.2*  --  16.8*  CREATININE 1.15  --   --   --  1.41* 1.59* 1.50*  --  1.32*  LATICACIDVEN  --  2.90* 2.74*  --   --   --   --   --   --   VANCOTROUGH  --   --   --   --   --   --  16  --  18  VANCOPEAK  --   --   --   --   --   --   --  36  --     Estimated Creatinine Clearance: 70.2 mL/min (A) (by C-G formula based on SCr of 1.32 mg/dL (H)).    Allergies  Allergen Reactions  . Duloxetine     REACTION: memory dysfunction, dizziness  . Latex Other (See Comments)    Reaction unknown  . Levofloxacin Nausea Only  . Mercury Other (See Comments)    Reaction unknown  . Sulfonamide Derivatives Other (See Comments)    Reaction unknown    Antimicrobials this admission:  Vancomycin 03/22/2017 >> Ceftriaxone 03/17/2017 >> 1/21  Dose adjustments/levels this admission:  1/20 VT = 16  on 1750 q24 1/21 0804 vancomycin 1750mg  IV q24h 1/21 VPk (1304 - 2.5 hrs after end of infusion) = 36 mcg/mL 1/22 VT 0730 = 18 mcg/mL  Microbiology results:  1/18 UCx: > 100k MRSA 1/18 BCx: 4/4 bottles w/ MRSA 1/20 repeat blood cxs: 2/2 peds bottles s. aureus  Thank you for allowing pharmacy to be a part of this patient's care.  Juliette Alcideustin Zeigler, PharmD, BCPS.   Pager: [phone] 2017/05/06 10:53 AM

## 2017-03-25 NOTE — Progress Notes (Signed)
CRITICAL VALUE ALERT  Critical Value:  MRSA positive  Date & Time Notied:  03/29/2017 1041  Provider Notified: Hanley BenAlekh  Orders Received/Actions taken: MRSA positive standing orders initiated.

## 2017-03-25 NOTE — Consult Note (Signed)
[facility] Consult   Reason for Consult:  Capacity evaluation and severe depression Referring Physician:  Dr. Starla Link Patient Identification: Kurt Reid MRN:  [id number] Principal Diagnosis: Delirium due to another medical condition Diagnosis:   Patient Active Problem List   Diagnosis Date Noted  . Leukocytosis [D72.829] 03/07/2017  . Sepsis ([place]) [A41.9] 04/01/2017  . Altered mental status [R41.82] 03/04/2017  . UTI (urinary tract infection) [N39.0] 04/02/2017  . Abnormal MRI of abdomen [R93.5] 11/20/2016  . Skin lesion [L98.9] 05/22/2016  . Liver nodule [K76.89] 11/17/2015  . Gynecomastia [N62] 06/02/2014  . Hypotension [I95.9] 01/18/2014  . Dog bite of multiple sites of right hand and fingers [S61.451A, W54.[place], [place] 12/10/2013  . DM type 2 without retinopathy ([place]) [E11.9] 12/10/2013  . Chronic hepatitis C ([place]) [B18.2] 12/10/2013  . Dyslipidemia [E78.5] 12/10/2013  . Hemoptysis [R04.2] 06/05/2013  . Acquired ptosis of right eyelid [H02.401] 08/26/2012  . Left lumbar radiculopathy [M54.16] 08/09/2011  . Abdominal pain, RUQ [R10.11] 08/09/2011  . Abscess of abdominal wall [L02.211] 07/24/2011  . MRSA (methicillin resistant Staphylococcus aureus) carrier [Z22.322] 07/24/2011  . Acute sinus infection [J01.90] 04/06/2011  . ADHD (attention deficit hyperactivity disorder) [F90.9] 04/06/2011  . Hepatic cirrhosis due to chronic hepatitis C infection ([place]) [B18.2, K74.60] 11/23/2010  . Encounter for well adult exam with abnormal findings [Z76.73] 07/23/2010  . Anxiety state [F41.1] 04/19/2009  . Esophageal varices ([place]) [I85.00] 04/19/2009  . DIVERTICULOSIS, COLON [K57.30] 04/19/2009  . INSOMNIA-SLEEP DISORDER-UNSPEC [G47.00] 04/19/2009  . Chronic pain syndrome [G89.4] 01/18/2009  . BENIGN PROSTATIC HYPERTROPHY [N40.0] 11/02/2007  . FATIGUE [R53.81, R53.83] 11/02/2007  . NEPHROLITHIASIS, HX OF [Z87.442] 11/02/2007  . BIPOLAR AFFECTIVE DISORDER [F31.9]  09/23/2006  . Depression [F32.9] 09/23/2006  . Allergic rhinitis [J30.9] 09/23/2006  . GERD [K21.9] 09/23/2006  . LOW BACK PAIN [M54.5] 09/23/2006  . Convulsions ([place]) [R56.9] 09/23/2006    Total Time spent with patient: 1 hour  Subjective:   Kurt Reid is a 67 y.o. male patient admitted with acute metabolic encephalopathy secondary to MRSA bacteremia and UTI.  HPI:   Per chart review, patient is receiving treatment for MRSA bacteremia and possible UTI. He is is receiving Vancomycin. He is also receiving Lactulose and Propranolol for decompensated liver cirrhosis with portal hypertension and mild hepatic encephalopathy. He is receiving workup for endocarditis. He refused TEE today and reported that he would prefer to die. His mental status has fluctuated and there is a concern that he does not have capacity to refuse treatment. He is prescribed Effexor 150 mg q am and 75 mg qhs.   On interview, Kurt Reid reports that he was admitted to the hospital for pain. He is unable to provide the medical conditions for which he is currently receiving treatment. He also denies a history of mental illness although he is prescribed Effexor. He reports poor sleep due to problems falling asleep. He denies SI, HI or AVH. He was poorly attentive throughout the interview with fluctuating consciousness.   Past Psychiatric History: Depression, anxiety, ADHD and bipolar disorder.   Risk to Self: Is patient at risk for suicide?: No Risk to Others:  None. Denies HI.  Prior Inpatient Therapy:  Denies  Prior Outpatient Therapy:  Denies   Past Medical History:  Past Medical History:  Diagnosis Date  . ADHD 09/23/2006  . ADHD (attention deficit hyperactivity disorder)   . Allergic rhinitis   . ALLERGIC RHINITIS 09/23/2006  . Anxiety   . ANXIETY 04/19/2009  . Benign prostatic  hypertrophy   . BENIGN PROSTATIC HYPERTROPHY 11/02/2007  . Bipolar 1 disorder ([place])   . BIPOLAR AFFECTIVE DISORDER 09/23/2006  .  Chronic pain syndrome 01/18/2009  . 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 Depression   . DEPRESSION 09/23/2006  . Diabetes mellitus without complication ([place])   . Diverticulosis of colon   . DIVERTICULOSIS, COLON 04/19/2009  . ESOPHAGEAL VARICES 04/19/2009  . ETOH abuse    hx  . GERD 09/23/2006  . GERD (gastroesophageal reflux disease)   . Hepatitis C   . HEPATITIS C 09/23/2006  . Hyperlipidemia   . HYPERLIPIDEMIA 09/23/2006  . INSOMNIA-SLEEP DISORDER-UNSPEC 04/19/2009  . Low back pain    chronic  . LOW BACK PAIN 09/23/2006  . Migraine   . MRSA (methicillin resistant staph aureus) culture positive   . Nephrolithiasis    hx  . NEPHROLITHIASIS, HX OF 11/02/2007  . SEIZURE DISORDER 09/23/2006  . Seizure disorder ([place])    H/O  . Sinusitis    recurrent    Past Surgical History:  Procedure Laterality Date  . back surgury     x 5  . cheekbone     hx of fx  . I&D EXTREMITY Right 12/10/2013   Procedure: IRRIGATION AND DEBRIDEMENT OF MULTIPLE DOG BITES RIGHT HAND AND WRIST;  Surgeon: Roseanne Kaufman, MD;  Location: [facility];  Service: Orthopedics;  Laterality: Right;  . knee surgury     x 1  . s/p sinus surgury  2010   Dr. Phebe Colla  . TONSILLECTOMY     Family History:  Family History  Problem Relation Age of Onset  . Dementia Father   . Stroke Mother   . Hyperlipidemia Mother   . Depression Mother   . Hypertension Mother   . Asthma Daughter   . Diabetes Neg Hx    Family Psychiatric  History: Denies but per medical records his mother has depression and father has dementia.  Social History:  Social History   Substance and Sexual Activity  Alcohol Use No     Social History   Substance and Sexual Activity  Drug Use No    Social History   Socioeconomic History  . Marital status: Married    Spouse name: None  . Number of children: 3  . Years of education: None  . Highest education level: None  Social Needs  . Financial resource strain: None  . Food  insecurity - worry: None  . Food insecurity - inability: None  . Transportation needs - medical: None  . Transportation needs - non-medical: None  Occupational History  . Occupation: used to work for [employer], now disabled due to back pain    Employer: UNEMPLOYED  Tobacco Use  . Smoking status: Former Research scientist (life sciences)  . Smokeless tobacco: Never Used  Substance and Sexual Activity  . Alcohol use: No  . Drug use: No  . Sexual activity: Not Currently  Other Topics Concern  . None  Social History Narrative  . None   Additional Social History: He lives at home with his wife and daughter. He denies alcohol, illicit substance or tobacco use.     Allergies:   Allergies  Allergen Reactions  . Duloxetine     REACTION: memory dysfunction, dizziness  . Latex Other (See Comments)    Reaction unknown  . Levofloxacin Nausea Only  . Mercury Other (See Comments)    Reaction unknown  . Sulfonamide Derivatives Other (See Comments)    Reaction unknown  Labs:  Results for orders placed or performed during the hospital encounter of 03/10/2017 (from the past 48 hour(s))  Glucose, capillary     Status: Abnormal   Collection Time: 03/23/17  4:43 PM  Result Value Ref Range   Glucose-Capillary 310 (H) 65 - 99 mg/dL  Glucose, capillary     Status: Abnormal   Collection Time: 03/23/17  9:40 PM  Result Value Ref Range   Glucose-Capillary 300 (H) 65 - 99 mg/dL  Vancomycin, trough     Status: None   Collection Time: 03/24/17  5:53 AM  Result Value Ref Range   Vancomycin Tr 16 15 - 20 ug/mL  CBC with Differential/Platelet     Status: Abnormal   Collection Time: 03/24/17  5:53 AM  Result Value Ref Range   WBC 24.2 (H) 4.0 - 10.5 K/uL   RBC 3.93 (L) 4.22 - 5.81 MIL/uL   Hemoglobin 12.1 (L) 13.0 - 17.0 g/dL   HCT 35.2 (L) 39.0 - 52.0 %   MCV 89.6 78.0 - 100.0 fL   MCH 30.8 26.0 - 34.0 pg   MCHC 34.4 30.0 - 36.0 g/dL   RDW 15.7 (H) 11.5 - 15.5 %   Platelets 146 (L) 150 - 400 K/uL    Neutrophils Relative % 92 %   Neutro Abs 22.2 (H) 1.7 - 7.7 K/uL   Lymphocytes Relative 3 %   Lymphs Abs 0.8 0.7 - 4.0 K/uL   Monocytes Relative 4 %   Monocytes Absolute 0.9 0.1 - 1.0 K/uL   Eosinophils Relative 1 %   Eosinophils Absolute 0.2 0.0 - 0.7 K/uL   Basophils Relative 0 %   Basophils Absolute 0.1 0.0 - 0.1 K/uL  Comprehensive metabolic panel     Status: Abnormal   Collection Time: 03/24/17  5:53 AM  Result Value Ref Range   Sodium 131 (L) 135 - 145 mmol/L   Potassium 4.3 3.5 - 5.1 mmol/L   Chloride 107 101 - 111 mmol/L   CO2 18 (L) 22 - 32 mmol/L   Glucose, Bld 265 (H) 65 - 99 mg/dL   BUN 42 (H) 6 - 20 mg/dL   Creatinine, Ser 1.50 (H) 0.61 - 1.24 mg/dL   Calcium 8.0 (L) 8.9 - 10.3 mg/dL   Total Protein 6.2 (L) 6.5 - 8.1 g/dL   Albumin 1.9 (L) 3.5 - 5.0 g/dL   AST 92 (H) 15 - 41 U/L   ALT 47 17 - 63 U/L   Alkaline Phosphatase 274 (H) 38 - 126 U/L   Total Bilirubin 3.5 (H) 0.3 - 1.2 mg/dL   GFR calc non Af Amer 46 (L) >60 mL/min   GFR calc Af Amer 54 (L) >60 mL/min    Comment: (NOTE) The eGFR has been calculated using the CKD EPI equation. This calculation has not been validated in all clinical situations. eGFR's persistently <60 mL/min signify possible Chronic Kidney Disease.    Anion gap 6 5 - 15  Magnesium     Status: None   Collection Time: 03/24/17  5:53 AM  Result Value Ref Range   Magnesium 2.1 1.7 - 2.4 mg/dL  Hemoglobin A1c     Status: Abnormal   Collection Time: 03/24/17  5:53 AM  Result Value Ref Range   Hgb A1c MFr Bld 8.7 (H) 4.8 - 5.6 %    Comment: (NOTE) Pre diabetes:          5.7%-6.4% Diabetes:              >  6.4% Glycemic control for   <7.0% adults with diabetes    Mean Plasma Glucose 202.99 mg/dL    Comment: Performed at [facility] [address], [place], [place] [address]  Glucose, capillary     Status: Abnormal   Collection Time: 03/24/17  7:39 AM  Result Value Ref Range   Glucose-Capillary 242 (H) 65 - 99 mg/dL   Comment 1  Notify RN    Comment 2 Document in Chart   Glucose, capillary     Status: Abnormal   Collection Time: 03/24/17 12:22 PM  Result Value Ref Range   Glucose-Capillary 289 (H) 65 - 99 mg/dL   Comment 1 Notify RN    Comment 2 Document in Chart   Vancomycin, peak     Status: None   Collection Time: 03/24/17  1:04 PM  Result Value Ref Range   Vancomycin Pk 36 30 - 40 ug/mL  Glucose, capillary     Status: Abnormal   Collection Time: 03/24/17  5:50 PM  Result Value Ref Range   Glucose-Capillary 259 (H) 65 - 99 mg/dL   Comment 1 Notify RN    Comment 2 Document in Chart   Glucose, capillary     Status: Abnormal   Collection Time: 03/24/17  9:15 PM  Result Value Ref Range   Glucose-Capillary 276 (H) 65 - 99 mg/dL  CBC with Differential/Platelet     Status: Abnormal   Collection Time: 03/04/2017  7:30 AM  Result Value Ref Range   WBC 16.8 (H) 4.0 - 10.5 K/uL   RBC 4.04 (L) 4.22 - 5.81 MIL/uL   Hemoglobin 12.4 (L) 13.0 - 17.0 g/dL   HCT 36.3 (L) 39.0 - 52.0 %   MCV 89.9 78.0 - 100.0 fL   MCH 30.7 26.0 - 34.0 pg   MCHC 34.2 30.0 - 36.0 g/dL   RDW 15.9 (H) 11.5 - 15.5 %   Platelets 137 (L) 150 - 400 K/uL   Neutrophils Relative % 80 %   Neutro Abs 13.7 (H) 1.7 - 7.7 K/uL   Lymphocytes Relative 8 %   Lymphs Abs 1.3 0.7 - 4.0 K/uL   Monocytes Relative 8 %   Monocytes Absolute 1.3 (H) 0.1 - 1.0 K/uL   Eosinophils Relative 3 %   Eosinophils Absolute 0.4 0.0 - 0.7 K/uL   Basophils Relative 1 %   Basophils Absolute 0.1 0.0 - 0.1 K/uL  Comprehensive metabolic panel     Status: Abnormal   Collection Time: 03/20/2017  7:30 AM  Result Value Ref Range   Sodium 133 (L) 135 - 145 mmol/L   Potassium 4.5 3.5 - 5.1 mmol/L   Chloride 108 101 - 111 mmol/L   CO2 21 (L) 22 - 32 mmol/L   Glucose, Bld 276 (H) 65 - 99 mg/dL   BUN 44 (H) 6 - 20 mg/dL   Creatinine, Ser 1.32 (H) 0.61 - 1.24 mg/dL   Calcium 8.1 (L) 8.9 - 10.3 mg/dL   Total Protein 6.2 (L) 6.5 - 8.1 g/dL   Albumin 1.7 (L) 3.5 - 5.0 g/dL    AST 72 (H) 15 - 41 U/L   ALT 42 17 - 63 U/L   Alkaline Phosphatase 278 (H) 38 - 126 U/L   Total Bilirubin 4.1 (H) 0.3 - 1.2 mg/dL   GFR calc non Af Amer 54 (L) >60 mL/min   GFR calc Af Amer >60 >60 mL/min    Comment: (NOTE) The eGFR has been calculated using the CKD EPI  equation. This calculation has not been validated in all clinical situations. eGFR's persistently <60 mL/min signify possible Chronic Kidney Disease.    Anion gap 4 (L) 5 - 15  Magnesium     Status: None   Collection Time: 03/26/2017  7:30 AM  Result Value Ref Range   Magnesium 2.2 1.7 - 2.4 mg/dL  Vancomycin, trough     Status: None   Collection Time: 03/09/2017  7:30 AM  Result Value Ref Range   Vancomycin Tr 18 15 - 20 ug/mL  Glucose, capillary     Status: Abnormal   Collection Time: 03/30/2017  7:50 AM  Result Value Ref Range   Glucose-Capillary 242 (H) 65 - 99 mg/dL  MRSA PCR Screening     Status: Abnormal   Collection Time: 03/22/2017  8:10 AM  Result Value Ref Range   MRSA by PCR POSITIVE (A) NEGATIVE    Comment:        The GeneXpert MRSA Assay (FDA approved for NASAL specimens only), is one component of a comprehensive MRSA colonization surveillance program. It is not intended to diagnose MRSA infection nor to guide or monitor treatment for MRSA infections. RESULT CALLED TO, READ BACK BY AND VERIFIED WITH: B.KAUR RN [id number] [email or web address]   Glucose, capillary     Status: Abnormal   Collection Time: 03/26/2017 11:34 AM  Result Value Ref Range   Glucose-Capillary 242 (H) 65 - 99 mg/dL    Current Facility-Administered Medications  Medication Dose Route Frequency Provider Last Rate Last Dose  . 0.9 %  sodium chloride infusion  250 mL Intravenous PRN Emokpae, Courage, MD      . acetaminophen (TYLENOL) tablet 650 mg  650 mg Oral Q6H PRN Emokpae, Courage, MD       Or  . acetaminophen (TYLENOL) suppository 650 mg  650 mg Rectal Q6H PRN Emokpae, Courage, MD      . albuterol (PROVENTIL) (2.5 MG/3ML) 0.083%  nebulizer solution 2.5 mg  2.5 mg Nebulization Q2H PRN Emokpae, Courage, MD      . aspirin EC tablet 81 mg  81 mg Oral Daily Emokpae, Courage, MD   81 mg at 03/17/2017 0958  . Chlorhexidine Gluconate Cloth 2 % PADS 6 each  6 each Topical Q0600 Alekh, Kshitiz, MD      . gabapentin (NEURONTIN) capsule 600 mg  600 mg Oral BID Denton Brick, Courage, MD   600 mg at 03/10/2017 0958  . gadobenate dimeglumine (MULTIHANCE) injection 10 mL  10 mL Intravenous Once PRN Meredith Pel, MD      . guaiFENesin-dextromethorphan [facility] DM) 100-10 MG/5ML syrup 5 mL  5 mL Oral Q4H PRN Emokpae, Courage, MD   5 mL at 03/23/17 0911  . heparin injection 5,000 Units  5,000 Units Subcutaneous Q8H Emokpae, Courage, MD   5,000 Units at 03/13/2017 0530  . insulin aspart (novoLOG) injection 0-9 Units  0-9 Units Subcutaneous TID WC Roxan Hockey, MD   3 Units at 04/02/2017 0957  . lactulose (CHRONULAC) 10 GM/15ML solution 30 g  30 g Oral BID Alekh, Kshitiz, MD      . metoprolol tartrate (LOPRESSOR) injection 5 mg  5 mg Intravenous Once PRN Blount, Scarlette Shorts T, NP      . multivitamin with minerals tablet 1 tablet  1 tablet Oral Daily Roxan Hockey, MD   1 tablet at 03/22/2017 0958  . mupirocin ointment (BACTROBAN) 2 % 1 application  1 application Nasal BID Aline August, MD      . ondansetron [facility]) tablet  4 mg  4 mg Oral Q6H PRN Emokpae, Courage, MD       Or  . ondansetron (ZOFRAN) injection 4 mg  4 mg Intravenous Q6H PRN Emokpae, Courage, MD      . oxyCODONE (Oxy IR/ROXICODONE) immediate release tablet 5 mg  5 mg Oral Q4H PRN Alekh, Kshitiz, MD      . pantoprazole (PROTONIX) EC tablet 40 mg  40 mg Oral Daily Emokpae, Courage, MD   40 mg at 03/04/2017 0958  . propranolol (INDERAL) tablet 20 mg  20 mg Oral BID Roxan Hockey, MD   20 mg at 03/20/2017 0958  . sodium chloride flush (NS) 0.9 % injection 3 mL  3 mL Intravenous Q12H Emokpae, Courage, MD   3 mL at 03/24/17 2245  . sodium chloride flush (NS) 0.9 % injection 3 mL  3 mL  Intravenous PRN Roxan Hockey, MD      . Derrill Memo ON 03/26/2017] vancomycin (VANCOCIN) 1,500 mg in sodium chloride 0.9 % 500 mL IVPB  1,500 mg Intravenous Q24H Berton Mount, RPH      . [START ON 03/26/2017] venlafaxine (EFFEXOR) tablet 150 mg  150 mg Oral Q breakfast Alekh, Kshitiz, MD      . venlafaxine [facility]) tablet 75 mg  75 mg Oral QHS Alekh, Kshitiz, MD      . venlafaxine [facility]) tablet 75 mg  75 mg Oral Once Aline August, MD        Musculoskeletal: Strength & Muscle Tone: decreased due to physical deconditioning.  Gait & Station: UTA since patient was lying in bed. Patient leans: N/A  Psychiatric Specialty Exam: Physical Exam  Nursing note and vitals reviewed. Constitutional: He appears well-developed and well-nourished.  HENT:  Head: Normocephalic and atraumatic.  Neck: Normal range of motion.  Musculoskeletal: Normal range of motion.  Neurological: He is alert.  Oriented to person and place.   Skin: No rash noted.  Psychiatric: He has a normal mood and affect. His speech is normal and behavior is normal. Thought content normal. Cognition and memory are impaired.    Review of Systems  Constitutional: Negative for chills and fever.  Gastrointestinal: Negative for nausea.  Psychiatric/Behavioral: Negative for depression, hallucinations, substance abuse and suicidal ideas. The patient has insomnia. The patient is not nervous/anxious.   All other systems reviewed and are negative.   Blood pressure 128/79, pulse 85, temperature 97.8 F (36.6 C), temperature source Axillary, resp. rate 20, height _0  (1.854 m), weight 108.6 kg (239 lb 6.7 oz), SpO2 94 %.Body mass index is 31.59 kg/m.  General Appearance: Fairly Groomed, obese, elderly, Caucasian male with jaundice and lying in bed with impaired consciousness. NAD.   Eye Contact:  Poor due to closed eyes.  Speech:  Slow  Volume:  Decreased  Mood:  "Okay"  Affect:  Constricted  Thought Process:  Linear   Orientation:  Other:  Person and place.  Thought Content:  Logical  Suicidal Thoughts:  No  Homicidal Thoughts:  No  Memory:  Immediate;   Poor Recent;   Poor Remote;   Poor  Judgement:  Impaired  Insight:  Lacking  Psychomotor Activity:  Decreased  Concentration:  Concentration: Poor and Attention Span: Poor  Recall:  Poor  Fund of Knowledge:  Poor  Language:  Fair  Akathisia:  No  Handed:  Right  AIMS (if indicated):   N/A  Assets:  Housing Social Support  ADL's:  Impaired  Cognition:  Impaired secondary to altered mental status.   Sleep:  Poor   Assessment:  AGNES PROBERT is a 67 y.o. male who was admitted with acute metabolic encephalopathy secondary to MRSA bacteremia and UTI. He was poorly attentive throughout the interview with impaired consciousness that is likely secondary to delirium secondary to multiple medical conditions. He does not demonstrate capacity to refuse treatment (TEE) at this time due to altered mental status. Depression cannot she accurately assessed in the setting of acute delirium and he is a poor historian at this time. Recommend continuing home Effexor and can reevaluate for depression once patient's mental status clears if deemed appropriate at this time.   Treatment Plan Summary: -Patient does not demonstrate capacity to refuse treatment related to medical condition. Please assign an authorized representative to help him make medical decisions. -Continue home Effexor 225 mg daily for depression and anxiety. -Stat Melatonin 3-6 mg qhs to regulate sleep/wake cycle.  -Psychiatry will sign off patient at this time. Please consult psychiatry again as needed.   Disposition: No evidence of imminent risk to self or others at present.   Patient does not meet criteria for psychiatric inpatient admission.  Faythe Dingwall, DO 03/12/2017 12:32 PM

## 2017-03-25 NOTE — Progress Notes (Signed)
Patient has left ankle joint effusion by MRI scan With no other source identified I think it is reasonable to have interventional radiology tap the ankle and send the fluid for Gram stain cell count and culture

## 2017-03-25 NOTE — Progress Notes (Signed)
RN was made aware that patient had 6 beats of vtach and wide QRS. Patient in no acute distress. MD notified. Will continue to monitor patient.

## 2017-03-26 ENCOUNTER — Telehealth: Payer: Self-pay | Admitting: Diagnostic Radiology

## 2017-03-26 ENCOUNTER — Encounter (HOSPITAL_COMMUNITY): Payer: Self-pay | Admitting: Diagnostic Radiology

## 2017-03-26 ENCOUNTER — Inpatient Hospital Stay (HOSPITAL_COMMUNITY): Payer: Medicare Other

## 2017-03-26 DIAGNOSIS — M25572 Pain in left ankle and joints of left foot: Secondary | ICD-10-CM

## 2017-03-26 DIAGNOSIS — M25472 Effusion, left ankle: Secondary | ICD-10-CM

## 2017-03-26 HISTORY — PX: IR US GUIDE BX ASP/DRAIN: IMG2392

## 2017-03-26 LAB — COMPREHENSIVE METABOLIC PANEL
ALBUMIN: 1.7 g/dL — AB (ref 3.5–5.0)
ALK PHOS: 336 U/L — AB (ref 38–126)
ALT: 64 U/L — AB (ref 17–63)
AST: 189 U/L — AB (ref 15–41)
Anion gap: 5 (ref 5–15)
BILIRUBIN TOTAL: 5.1 mg/dL — AB (ref 0.3–1.2)
BUN: 42 mg/dL — AB (ref 6–20)
CO2: 22 mmol/L (ref 22–32)
CREATININE: 1.32 mg/dL — AB (ref 0.61–1.24)
Calcium: 8.1 mg/dL — ABNORMAL LOW (ref 8.9–10.3)
Chloride: 107 mmol/L (ref 101–111)
GFR calc Af Amer: >60 mL/min (ref >60)
GFR calc non Af Amer: 54 mL/min — ABNORMAL LOW (ref >60)
GLUCOSE: 171 mg/dL — AB (ref 65–99)
POTASSIUM: 4.3 mmol/L (ref 3.5–5.1)
Sodium: 134 mmol/L — ABNORMAL LOW (ref 135–145)
Total Protein: 6.5 g/dL (ref 6.5–8.1)

## 2017-03-26 LAB — CULTURE, BLOOD (ROUTINE X 2)
SPECIAL REQUESTS: ADEQUATE
Special Requests: ADEQUATE

## 2017-03-26 LAB — CBC WITH DIFFERENTIAL/PLATELET
BASOS ABS: 0.1 10*3/uL (ref 0.0–0.1)
BASOS PCT: 0 %
Eosinophils Absolute: 0.6 10*3/uL (ref 0.0–0.7)
Eosinophils Relative: 3 %
HEMATOCRIT: 36.3 % — AB (ref 39.0–52.0)
HEMOGLOBIN: 12.2 g/dL — AB (ref 13.0–17.0)
LYMPHS PCT: 11 %
Lymphs Abs: 2 10*3/uL (ref 0.7–4.0)
MCH: 30.3 pg (ref 26.0–34.0)
MCHC: 33.6 g/dL (ref 30.0–36.0)
MCV: 90.3 fL (ref 78.0–100.0)
Monocytes Absolute: 1.2 10*3/uL — ABNORMAL HIGH (ref 0.1–1.0)
Monocytes Relative: 7 %
NEUTROS ABS: 13.7 10*3/uL — AB (ref 1.7–7.7)
NEUTROS PCT: 79 %
Platelets: 148 10*3/uL — ABNORMAL LOW (ref 150–400)
RBC: 4.02 MIL/uL — AB (ref 4.22–5.81)
RDW: 16.2 % — AB (ref 11.5–15.5)
WBC: 17.6 10*3/uL — ABNORMAL HIGH (ref 4.0–10.5)

## 2017-03-26 LAB — SYNOVIAL CELL COUNT + DIFF, W/ CRYSTALS
Lymphocytes-Synovial Fld: 3 % (ref 0–20)
MONOCYTE-MACROPHAGE-SYNOVIAL FLUID: 10 % — AB (ref 50–90)
Neutrophil, Synovial: 87 % — ABNORMAL HIGH (ref 0–25)
WBC, Synovial: UNDETERMINED /mm3 (ref 0–200)

## 2017-03-26 LAB — GLUCOSE, CAPILLARY
GLUCOSE-CAPILLARY: 168 mg/dL — AB (ref 65–99)
Glucose-Capillary: 144 mg/dL — ABNORMAL HIGH (ref 65–99)
Glucose-Capillary: 149 mg/dL — ABNORMAL HIGH (ref 65–99)
Glucose-Capillary: 153 mg/dL — ABNORMAL HIGH (ref 65–99)

## 2017-03-26 LAB — MAGNESIUM: Magnesium: 2.2 mg/dL (ref 1.7–2.4)

## 2017-03-26 MED ORDER — OXYCODONE HCL 5 MG PO TABS
5.0000 mg | ORAL_TABLET | Freq: Once | ORAL | Status: AC
  Administered 2017-03-26: 5 mg via ORAL
  Filled 2017-03-26: qty 1

## 2017-03-26 MED ORDER — LACTULOSE 10 GM/15ML PO SOLN
30.0000 g | Freq: Three times a day (TID) | ORAL | Status: DC
  Administered 2017-03-26 – 2017-03-30 (×9): 30 g via ORAL
  Filled 2017-03-26 (×10): qty 45

## 2017-03-26 MED ORDER — LORAZEPAM 2 MG/ML IJ SOLN
0.5000 mg | Freq: Once | INTRAMUSCULAR | Status: AC
  Administered 2017-03-26: 0.5 mg via INTRAVENOUS
  Filled 2017-03-26: qty 1

## 2017-03-26 MED ORDER — LIDOCAINE HCL 1 % IJ SOLN
INTRAMUSCULAR | Status: AC
  Administered 2017-03-26: 13:00:00
  Filled 2017-03-26: qty 20

## 2017-03-26 NOTE — Procedures (Signed)
US guided aspiration of left ankle joint fluid.  Removed cloudy yellow fluid (looks purulent) from anterolateral left ankle joint.  No blood loss and no immediate complication.

## 2017-03-26 NOTE — Progress Notes (Signed)
Physical Therapy Treatment Patient Details Name: Kurt BundeRichard S Reid MRN: [id number] DOB: [birth year] Today's Date: 03/26/2017    History of Present Illness  Kurt BundeRichard S Reid is a 67 y.o. male  With hx of treated hep C cirrhosis c/b PSE and portal hypertension/ EV, bipolar disease, admitted for worsening AMS, and leukocytosis with left shift.     PT Comments    Pt very lethargic today, difficulty participating with therapy, difficulty following commands; per NT this has been pt's baseline today;  Recommend SNF unless cognition clears and/or pt has 24hr assist at home  Follow Up Recommendations  SNF     Equipment Recommendations  None recommended by PT    Recommendations for Other Services       Precautions / Restrictions Precautions Precautions: Fall Restrictions Weight Bearing Restrictions: No    Mobility  Bed Mobility Overal bed mobility: Needs Assistance Bed Mobility: Supine to Sit     Supine to sit: Supervision     General bed mobility comments: briefly long sits and begins to come to EOB, then lays back and keeps eyes closed  Transfers                 General transfer comment: unsafe to attempt with +1 assist, per NT pt has been sleeping most of day and with consistent difficulty following commmands  Ambulation/Gait                 Stairs            Wheelchair Mobility    Modified Rankin (Stroke Patients Only)       Balance Overall balance assessment: Needs assistance Sitting-balance support: Bilateral upper extremity supported Sitting balance-Leahy Scale: Poor Sitting balance - Comments: lays back onto elevated (HOB) without warning                                    Cognition Arousal/Alertness: Lethargic   Overall Cognitive Status: Impaired/Different from baseline                         Following Commands: Follows one step commands inconsistently Safety/Judgement: Decreased awareness of safety    Problem Solving: Slow processing;Difficulty sequencing General Comments: pt arouses but falls asleep easily, requires excessive time to follow one step commands, limited verbalizations, does not maintain eye contact      Exercises      General Comments        Pertinent Vitals/Pain Pain Assessment: Faces Faces Pain Scale: Hurts little more Pain Location: L ankle Pain Descriptors / Indicators: Grimacing Pain Intervention(s): Monitored during session    Home Living                      Prior Function            PT Goals (current goals can now be found in the care plan section) Acute Rehab PT Goals PT Goal Formulation: Patient unable to participate in goal setting Time For Goal Achievement: 04/05/17 Potential to Achieve Goals: Fair Progress towards PT goals: Not progressing toward goals - comment(too lethargic)    Frequency    Min 2X/week      PT Plan Frequency needs to be updated;Discharge plan needs to be updated    Co-evaluation              AM-PAC PT "6 Clicks" Daily Activity  Outcome Measure  Difficulty turning over in bed (including adjusting bedclothes, sheets and blankets)?: Unable Difficulty moving from lying on back to sitting on the side of the bed? : Unable Difficulty sitting down on and standing up from a chair with arms (e.g., wheelchair, bedside commode, etc,.)?: Unable Help needed moving to and from a bed to chair (including a wheelchair)?: Total Help needed walking in hospital room?: Total Help needed climbing 3-5 steps with a railing? : Total 6 Click Score: 6    End of Session Equipment Utilized During Treatment: Gait belt Activity Tolerance: Patient limited by lethargy Patient left: in bed;with call bell/phone within reach;with bed alarm set   PT Visit Diagnosis: Unsteadiness on feet (R26.81);Difficulty in walking, not elsewhere classified (R26.2)     Time: 4098-1191 PT Time Calculation (min) (ACUTE ONLY): 11  min  Charges:  $Therapeutic Activity: 8-22 mins                    G Codes:          Absalom Aro 03/30/2017, 3:20 PM

## 2017-03-26 NOTE — Progress Notes (Addendum)
PROGRESS NOTE    JAYCE KAINZ  ZOX:[id number] DOB: November 25, 1950 DOA: 03/11/2017 PCP: [name], MD    Brief Narrative:  67 year old male with history of liver cirrhosis secondary to hepatitis C, diabetes, bipolar disorder sent by PCP for evaluation of abdominal pain and confusion.  Patient was admitted with leukocytosis probably from UTI and started on antibiotics.  He was found to have MRSA bacteremia.  ID was consulted.     Assessment & Plan:   Principal Problem:   Delirium due to another medical condition Active Problems:   BIPOLAR AFFECTIVE DISORDER   Chronic pain syndrome   Esophageal varices (HCC)   Hepatic cirrhosis due to chronic hepatitis C infection (HCC)   DM type 2 without retinopathy (HCC)   Chronic hepatitis C (HCC)   Leukocytosis   Sepsis (HCC)   Altered mental status   UTI (urinary tract infection)  MRSA bacteremia -Questionable cause.  Repeat blood cultures from 03/23/2017 is still positive.  ID following.   2D echo negative for vegetation.  Patient was to have a TEE on 03/27/2017 however patient refused.  Patient has been seen by psychiatry and lacks capacity at this time.  RN spoke with family who will be fine signing consent for TEE.  Will contact cardiology to see if TEE can be re-arranged.  Patient status post left ankle aspiration per interventional radiology with cultures pending.  Continue current empiric IV vancomycin.  ID following.  Duration of antibiotics per ID.  Probable acute metabolic encephalopathy from bacteremia/UTI/mild hepatic encephalopathy causing altered mental status -Mental status is improving but he is still confused per nursing.   -CT abdomen and pelvis showed only small ascites; chest x-ray on admission did not show infiltrates. -Increase lactulose to 3 times daily.  Continue empiric IV antibiotics.  Follow.  MRSAUTI -Urine cultures positive for MRSA.  Continue IV vancomycin  Leukocytosis -Probably secondary to  MRSA  bacteremia/UTI.  Is not currently afebrile.  2D echo negative for vegetations.  Will likely need a TEE.  Continue empiric IV vancomycin.  ID following.   Decompensated liver Cirrhosiswith portal hypertension and mild hepatic encephalopathy -Increase lactulose to 3 times daily.  Continue propranolol. 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  No surgical intervention as per orthopedics. -fall precautions -PT evaluation -Per orthopedics.  Left ankle effusion Concern for septic arthritis.  Patient seen by orthopedics.  Patient status post aspiration of left ankle effusion per interventional radiology 03/26/2017.  Cultures pending.  Continue current regimen of IV antibiotics.  Hyponatremia -Probably from dehydration.  Follow.  Thrombocytopenia -Likely secondary to cirrhosis.  Improving.  No bleeding noted.    Diabetes mellitus Hemoglobin A1c 8.7.  CBGs have ranged from 144-153.  Current regimen of Levemir 15 units daily and sliding scale insulin.  Patient with poor oral intake and as such we will discontinue meal coverage insulin.      DVT prophylaxis: Heparin Code Status: Full Family Communication: No family at bedside. Disposition Plan: To be determined.   Consultants:   Infectious disease: Dr. Drue Second 03/22/2017  Orthopedics Dr. August Saucer 03/23/2017  Psychiatry: Dr. Sharma Covert 03/10/2017  Procedures:   CT abdomen and pelvis 03/25/2017  Plain films of the ankle 03/27/2017, 03/23/2017  Chest x-ray 03/30/2017  MRI left ankle 03/24/2017  MRI right hip 03/22/2017  Ultrasound-guided aspiration left ankle joint effusion Per Dr. Lowella Dandy interventional radiology 03/26/2017  Plain films of bilateral  hips with pelvis 03/29/2017  2D echo 03/22/2017  Antimicrobials:   IV vancomycin 03/22/2017>>>>>  IV Rocephin 04/02/2017>>>>> 03/24/2017   Subjective: Patient asleep.  Per nursing patient still with some confusion.  Objective: Vitals:   03/24/2017 1324 03/23/2017 1958 03/26/17 0457 03/26/17 1343  BP: 129/75 (!) 142/77 (!) 147/83 138/80  Pulse: 86 85 85 80  Resp: 20 20 20 16   Temp: 98.1 F (36.7 C) 98.6 F (37 C) 98.4 F (36.9 C) 98.3 F (36.8 C)  TempSrc: Oral Oral Oral Oral  SpO2: 95% 93% 94% 97%  Weight:      Height:        Intake/Output Summary (Last 24 hours) at 03/26/2017 1814 Last data filed at 03/26/2017 1500 Gross per 24 hour  Intake 740 ml  Output 650 ml  Net 90 ml   Filed Weights   03/23/17 0504 03/24/17 0445 04/02/2017 0608  Weight: 106.7 kg (235 lb 3.7 oz) 108 kg (238 lb 1.6 oz) 108.6 kg (239 lb 6.7 oz)    Examination:  General exam: Asleep. Respiratory system: Clear to auscultation anterior lung fields. Respiratory effort normal. Cardiovascular system: S1 & S2 heard, RRR. No JVD, murmurs, rubs, gallops or clicks.  1+ bilateral lower extremity edema. Gastrointestinal system: Abdomen is nondistended, soft and nontender. No organomegaly or masses felt. Normal bowel sounds heard. Central nervous system: Alert and oriented. No focal neurological deficits. Extremities: Symmetric 5 x 5 power. Skin: No rashes, lesions or ulcers Psychiatry: Unable to assess at this time.. Mood & affect appropriate.     Data Reviewed: I have personally reviewed following labs and imaging studies  CBC: Recent Labs  Lab 04/02/2017 1131 03/22/17 0659 03/23/17 0550 03/24/17 0553 03/04/2017 0730 03/26/17 0617  WBC 30.2* 20.1* 18.9* 24.2* 16.8* 17.6*  NEUTROABS 27.2*  --  16.5* 22.2* 13.7* 13.7*  HGB 13.6 12.0* 11.6* 12.1* 12.4* 12.2*  HCT 38.6* 35.2* 34.8* 35.2* 36.3* 36.3*  MCV 88.3 90.0 90.2 89.6 89.9 90.3  PLT 208 120* 110* 146* 137* 148*   Basic Metabolic Panel: Recent Labs   Lab 03/22/17 0958 03/23/17 0550 03/24/17 0553 03/12/2017 0730 03/26/17 0617  NA 129* 130* 131* 133* 134*  K 3.9 4.1 4.3 4.5 4.3  CL 98* 103 107 108 107  CO2 22 21* 18* 21* 22  GLUCOSE 249* 292* 265* 276* 171*  BUN 35* 44* 42* 44* 42*  CREATININE 1.41* 1.59* 1.50* 1.32* 1.32*  CALCIUM 7.8* 7.6* 8.0* 8.1* 8.1*  MG  --  2.1 2.1 2.2 2.2   GFR: Estimated Creatinine Clearance: 70.2 mL/min (A) (by C-G formula based on SCr of 1.32 mg/dL (H)). Liver Function Tests: Recent Labs  Lab 03/22/17 0958 03/23/17 0550 03/24/17 0553 03/15/2017 0730 03/26/17 0617  AST 60* 96* 92* 72* 189*  ALT 29 37 47 42 64*  ALKPHOS 165* 204* 274* 278* 336*  BILITOT 3.7* 3.1* 3.5* 4.1* 5.1*  PROT 5.9* 5.5* 6.2* 6.2* 6.5  ALBUMIN 1.9* 1.8* 1.9* 1.7* 1.7*   Recent Labs  Lab 03/11/2017 1131  LIPASE 30   Recent Labs  Lab 03/31/2017 1132  AMMONIA 37*   Coagulation Profile: No results for input(s): INR, PROTIME in the last 168 hours. Cardiac Enzymes: No results for input(s): CKTOTAL, CKMB, CKMBINDEX, TROPONINI in the last 168 hours. BNP (last 3 results) No results for input(s): PROBNP in the last  8760 hours. HbA1C: Recent Labs    03/24/17 0553  HGBA1C 8.7*   CBG: Recent Labs  Lab 03/24/2017 1615 04/02/2017 2020 03/26/17 0750 03/26/17 1143 03/26/17 1643  GLUCAP 241* 212* 144* 149* 153*   Lipid Profile: No results for input(s): CHOL, HDL, LDLCALC, TRIG, CHOLHDL, LDLDIRECT in the last 72 hours. Thyroid Function Tests: No results for input(s): TSH, T4TOTAL, FREET4, T3FREE, THYROIDAB in the last 72 hours. Anemia Panel: No results for input(s): VITAMINB12, FOLATE, FERRITIN, TIBC, IRON, RETICCTPCT in the last 72 hours. Sepsis Labs: Recent Labs  Lab 03/27/2017 1313 03/20/2017 1503  LATICACIDVEN 2.90* 2.74*    Recent Results (from the past 240 hour(s))  Culture, blood (routine x 2)     Status: Abnormal   Collection Time: 03/07/2017 11:31 AM  Result Value Ref Range Status   Specimen Description BLOOD  BLOOD RIGHT FOREARM  Final   Special Requests   Final    BOTTLES DRAWN AEROBIC AND ANAEROBIC Blood Culture adequate volume   Culture  Setup Time   Final    GRAM POSITIVE COCCI IN BOTH AEROBIC AND ANAEROBIC BOTTLES CRITICAL RESULT CALLED TO, READ BACK BY AND VERIFIED WITHPeggyann Juba [facility] 6962 03/22/17 A BROWNING Performed at [facility], [address], [place], [place] [address]    Culture METHICILLIN RESISTANT STAPHYLOCOCCUS AUREUS (A)  Final   Report Status 03/24/2017 FINAL  Final   Organism ID, Bacteria METHICILLIN RESISTANT STAPHYLOCOCCUS AUREUS  Final      Susceptibility   Methicillin resistant staphylococcus aureus - MIC*    CIPROFLOXACIN <=0.5 SENSITIVE Sensitive     ERYTHROMYCIN >=8 RESISTANT Resistant     GENTAMICIN <=0.5 SENSITIVE Sensitive     OXACILLIN RESISTANT Resistant     TETRACYCLINE <=1 SENSITIVE Sensitive     VANCOMYCIN <=0.5 SENSITIVE Sensitive     TRIMETH/SULFA <=10 SENSITIVE Sensitive     CLINDAMYCIN <=0.25 SENSITIVE Sensitive     RIFAMPIN <=0.5 SENSITIVE Sensitive     Inducible Clindamycin NEGATIVE Sensitive     * METHICILLIN RESISTANT STAPHYLOCOCCUS AUREUS  Blood Culture ID Panel (Reflexed)     Status: Abnormal   Collection Time: 03/20/2017 11:31 AM  Result Value Ref Range Status   Enterococcus species NOT DETECTED NOT DETECTED Final   Listeria monocytogenes NOT DETECTED NOT DETECTED Final   Staphylococcus species DETECTED (A) NOT DETECTED Final    Comment: CRITICAL RESULT CALLED TO, READ BACK BY AND VERIFIED WITHPeggyann Juba PHARMD 1324 03/22/17 A BROWNING    Staphylococcus aureus DETECTED (A) NOT DETECTED Final    Comment: Methicillin (oxacillin)-resistant Staphylococcus aureus (MRSA). MRSA is predictably resistant to beta-lactam antibiotics (except ceftaroline). Preferred therapy is vancomycin unless clinically contraindicated. Patient requires contact precautions if  hospitalized. CRITICAL RESULT CALLED TO, READ BACK BY AND VERIFIED WITH: Peggyann Juba PHARMD [id number] 03/22/17 A BROWNING    Methicillin resistance DETECTED (A) NOT DETECTED Final    Comment: CRITICAL RESULT CALLED TO, READ BACK BY AND VERIFIED WITH: Peggyann Juba PHARMD 2725 03/22/17 A BROWNING    Streptococcus species NOT DETECTED NOT DETECTED Final   Streptococcus agalactiae NOT DETECTED NOT DETECTED Final   Streptococcus pneumoniae NOT DETECTED NOT DETECTED Final   Streptococcus pyogenes NOT DETECTED NOT DETECTED Final   Acinetobacter baumannii NOT DETECTED NOT DETECTED Final   Enterobacteriaceae species NOT DETECTED NOT DETECTED Final   Enterobacter cloacae complex NOT DETECTED NOT DETECTED Final   Escherichia coli NOT DETECTED NOT DETECTED Final   Klebsiella oxytoca NOT DETECTED NOT DETECTED Final  Klebsiella pneumoniae NOT DETECTED NOT DETECTED Final   Proteus species NOT DETECTED NOT DETECTED Final   Serratia marcescens NOT DETECTED NOT DETECTED Final   Haemophilus influenzae NOT DETECTED NOT DETECTED Final   Neisseria meningitidis NOT DETECTED NOT DETECTED Final   Pseudomonas aeruginosa NOT DETECTED NOT DETECTED Final   Candida albicans NOT DETECTED NOT DETECTED Final   Candida glabrata NOT DETECTED NOT DETECTED Final   Candida krusei NOT DETECTED NOT DETECTED Final   Candida parapsilosis NOT DETECTED NOT DETECTED Final   Candida tropicalis NOT DETECTED NOT DETECTED Final    Comment: Performed at [facility], [address] [place], [place] [address]  Culture, blood (routine x 2)     Status: Abnormal   Collection Time: 03/11/2017  1:07 PM  Result Value Ref Range Status   Specimen Description BLOOD BLOOD LEFT FOREARM  Final   Special Requests   Final    BOTTLES DRAWN AEROBIC AND ANAEROBIC Blood Culture adequate volume   Culture  Setup Time   Final    GRAM POSITIVE COCCI IN BOTH AEROBIC AND ANAEROBIC BOTTLES CRITICAL RESULT CALLED TO, READ BACK BY AND VERIFIED WITH: Sophronia SimasM. BELL PHARMD, AT [id number] 03/22/17 BY D. VANHOOK    Culture (A)  Final     STAPHYLOCOCCUS AUREUS SUSCEPTIBILITIES PERFORMED ON PREVIOUS CULTURE WITHIN THE LAST 5 DAYS. Performed at [facility], [address] [place], [place] [address]    Report Status 03/24/2017 FINAL  Final  Urine Culture     Status: Abnormal   Collection Time: 03/17/2017  3:36 PM  Result Value Ref Range Status   Specimen Description URINE, CLEAN CATCH  Final   Special Requests Normal  Final   Culture (A)  Final    >=100,000 COLONIES/mL METHICILLIN RESISTANT STAPHYLOCOCCUS AUREUS   Report Status 03/24/2017 FINAL  Final   Organism ID, Bacteria METHICILLIN RESISTANT STAPHYLOCOCCUS AUREUS (A)  Final      Susceptibility   Methicillin resistant staphylococcus aureus - MIC*    CIPROFLOXACIN <=0.5 SENSITIVE Sensitive     GENTAMICIN <=0.5 SENSITIVE Sensitive     NITROFURANTOIN <=16 SENSITIVE Sensitive     OXACILLIN >=4 RESISTANT Resistant     TETRACYCLINE <=1 SENSITIVE Sensitive     VANCOMYCIN <=0.5 SENSITIVE Sensitive     TRIMETH/SULFA <=10 SENSITIVE Sensitive     CLINDAMYCIN <=0.25 SENSITIVE Sensitive     RIFAMPIN <=0.5 SENSITIVE Sensitive     Inducible Clindamycin NEGATIVE Sensitive     * >=100,000 COLONIES/mL METHICILLIN RESISTANT STAPHYLOCOCCUS AUREUS  Culture, blood (routine x 2)     Status: Abnormal   Collection Time: 03/23/17  5:50 AM  Result Value Ref Range Status   Specimen Description BLOOD LEFT HAND  Final   Special Requests IN PEDIATRIC BOTTLE Blood Culture adequate volume  Final   Culture  Setup Time   Final    GRAM POSITIVE COCCI IN PEDIATRIC BOTTLE CRITICAL RESULT CALLED TO, READ BACK BY AND VERIFIED WITH: NICK GLOGOVAC AT 0743 ON [id number] BY SJW    Culture (A)  Final    STAPHYLOCOCCUS AUREUS SUSCEPTIBILITIES PERFORMED ON PREVIOUS CULTURE WITHIN THE LAST 5 DAYS. Performed at [facility], [address] [place], [place] [address]    Report Status 03/26/2017 FINAL  Final  Culture, blood (routine x 2)     Status: Abnormal   Collection Time: 03/23/17  5:50 AM    Result Value Ref Range Status   Specimen Description BLOOD LEFT ANTECUBITAL  Final   Special  Requests IN PEDIATRIC BOTTLE Blood Culture adequate volume  Final   Culture  Setup Time   Final    GRAM POSITIVE COCCI IN PEDIATRIC BOTTLE CRITICAL RESULT CALLED TO, READ BACK BY AND VERIFIED WITH: NICK GLOGOVAC PHARMD AT 0741 ON [id number] BY SJW    Culture (A)  Final    STAPHYLOCOCCUS AUREUS SUSCEPTIBILITIES PERFORMED ON PREVIOUS CULTURE WITHIN THE LAST 5 DAYS. Performed at [facility], [address] [place], [place] [address]    Report Status 03/26/2017 FINAL  Final  MRSA PCR Screening     Status: Abnormal   Collection Time: 03/27/2017  8:10 AM  Result Value Ref Range Status   MRSA by PCR POSITIVE (A) NEGATIVE Final    Comment:        The GeneXpert MRSA Assay (FDA approved for NASAL specimens only), is one component of a comprehensive MRSA colonization surveillance program. It is not intended to diagnose MRSA infection nor to guide or monitor treatment for MRSA infections. RESULT CALLED TO, READ BACK BY AND VERIFIED WITH: B.KAUR RN 1042 [phone] A.[email or web address]   Body fluid culture     Status: None (Preliminary result)   Collection Time: 03/26/17  1:04 PM  Result Value Ref Range Status   Specimen Description ANKLE SYNOVIAL  Final   Special Requests NONE  Final   Gram Stain   Final    NO ORGANISMS SEEN WBC PRESENT, PREDOMINANTLY PMN Gram Stain Report Called to,Read Back By and Verified With: D.BLOCK RN AT 1412 ON 03/26/16 BY S.VANHOORNE    Culture PENDING  Incomplete   Report Status PENDING  Incomplete         Radiology Studies: Ir US Guide Bx Asp/drain  Result Date: 03/26/2017 INDICATION: 67 year old with left ankle swelling. Left ankle effusion identified on MRI. EXAM: ULTRASOUND-GUIDED ASPIRATION OF LEFT ANKLE JOINT EFFUSION MEDICATIONS: None ANESTHESIA/SEDATION: None COMPLICATIONS: None immediate. PROCEDURE: Patient is confused and unable to give consent. Consent was  obtained for the patient's wife via the telephone. The left ankle was evaluated with ultrasound. Ankle effusion was identified along the anterolateral aspect of the ankle. The skin was prepped with chlorhexidine. A sterile field was created. Skin was anesthetized with 1% lidocaine. Using ultrasound guidance, 18 gauge spinal needle was directed into the joint effusion. Initially 3 mL thick yellow fluid was aspirated. 2 mL of additional bloody fluid was removed. Fluid was sent for analysis. Bandage placed over the puncture site. FINDINGS: Effusion along the anterolateral aspect of the ankle joint. 5 mL of fluid was removed. IMPRESSION: Successful ultrasound-guided aspiration of the left ankle joint effusion. Electronically Signed   By: Richarda Overlie M.D.   On: 03/26/2017 14:51        Scheduled Meds: . aspirin EC  81 mg Oral Daily  . Chlorhexidine Gluconate Cloth  6 each Topical Q0600  . gabapentin  600 mg Oral BID  . heparin  5,000 Units Subcutaneous Q8H  . insulin aspart  0-15 Units Subcutaneous TID WC  . insulin aspart  0-5 Units Subcutaneous QHS  . insulin aspart  5 Units Subcutaneous TID WC  . insulin detemir  15 Units Subcutaneous QHS  . lactulose  30 g Oral BID  . multivitamin with minerals  1 tablet Oral Daily  . mupirocin ointment  1 application Nasal BID  . pantoprazole  40 mg Oral Daily  . propranolol  20 mg Oral BID  . sodium chloride flush  3 mL Intravenous Q12H  . venlafaxine  150 mg Oral Q  breakfast  . venlafaxine  75 mg Oral QHS   Continuous Infusions: . sodium chloride    . vancomycin Stopped (03/26/17 1153)     LOS: 5 days    Time spent: 35 mins    Ramiro Harvest, MD [facility] Pager [phone]  If 7PM-7AM, please contact night-coverage [email or web address] Password [facility] 03/26/2017, 6:14 PM

## 2017-03-26 NOTE — Progress Notes (Signed)
[facility]    Date of Admission:  04/30/2017   Total days of antibiotics 6 vanco           ID: Kurt Reid is a 67 y.o. male with Principal Problem:   Delirium due to another medical condition Active Problems:   BIPOLAR AFFECTIVE DISORDER   Chronic pain syndrome   Esophageal varices (HCC)   Hepatic cirrhosis due to chronic hepatitis C infection (HCC)   DM type 2 without retinopathy (HCC)   Chronic hepatitis C (HCC)   Leukocytosis   Sepsis (HCC)   Altered mental status   UTI (urinary tract infection)    Subjective: Afebrile, had aspiration of ankle concerning for septic arthritis  Medications:  . aspirin EC  81 mg Oral Daily  . Chlorhexidine Gluconate Cloth  6 each Topical Q0600  . gabapentin  600 mg Oral BID  . heparin  5,000 Units Subcutaneous Q8H  . insulin aspart  0-15 Units Subcutaneous TID WC  . insulin aspart  0-5 Units Subcutaneous QHS  . insulin aspart  5 Units Subcutaneous TID WC  . insulin detemir  15 Units Subcutaneous QHS  . lactulose  30 g Oral BID  . multivitamin with minerals  1 tablet Oral Daily  . mupirocin ointment  1 application Nasal BID  . pantoprazole  40 mg Oral Daily  . propranolol  20 mg Oral BID  . sodium chloride flush  3 mL Intravenous Q12H  . venlafaxine  150 mg Oral Q breakfast  . venlafaxine  75 mg Oral QHS    Objective: Vital signs in last 24 hours: Temp:  [98.3 F (36.8 C)-98.6 F (37 C)] 98.3 F (36.8 C) (01/23 1343) Pulse Rate:  [80-85] 80 (01/23 1343) Resp:  [16-20] 16 (01/23 1343) BP: (138-147)/(77-83) 138/80 (01/23 1343) SpO2:  [93 %-97 %] 97 % (01/23 1343)    Lab Results Recent Labs    03/06/2017 0730 03/26/17 0617  WBC 16.8* 17.6*  HGB 12.4* 12.2*  HCT 36.3* 36.3*  NA 133* 134*  K 4.5 4.3  CL 108 107  CO2 21* 22  BUN 44* 42*  CREATININE 1.32* 1.32*   Liver Panel Recent Labs    03/27/2017 0730 03/26/17 0617  PROT 6.2* 6.5  ALBUMIN 1.7* 1.7*  AST 72* 189*  ALT 42 64*    ALKPHOS 278* 336*  BILITOT 4.1* 5.1*   Sedimentation Rate No results for input(s): ESRSEDRATE in the last 72 hours. C-Reactive Protein No results for input(s): CRP in the last 72 hours.  Microbiology: 1/23 aspirate pending 1/23 blood cx pending 1/20 blood cx MRSA 1/18 blood cx MRSA Studies/Results: Ir Koreas Guide Bx Asp/drain  Result Date: 03/26/2017 INDICATION: 67 year old with left ankle swelling. Left ankle effusion identified on MRI. EXAM: ULTRASOUND-GUIDED ASPIRATION OF LEFT ANKLE JOINT EFFUSION MEDICATIONS: None ANESTHESIA/SEDATION: None COMPLICATIONS: None immediate. PROCEDURE: Patient is confused and unable to give consent. Consent was obtained for the patient's wife via the telephone. The left ankle was evaluated with ultrasound. Ankle effusion was identified along the anterolateral aspect of the ankle. The skin was prepped with chlorhexidine. A sterile field was created. Skin was anesthetized with 1% lidocaine. Using ultrasound guidance, 18 gauge spinal needle was directed into the joint effusion. Initially 3 mL thick yellow fluid was aspirated. 2 mL of additional bloody fluid was removed. Fluid was sent for analysis. Bandage placed over the puncture site. FINDINGS: Effusion along the anterolateral aspect of the ankle joint. 5 mL of fluid  was removed. IMPRESSION: Successful ultrasound-guided aspiration of the left ankle joint effusion. Electronically Signed   By: Richarda Overlie M.D.   On: 03/26/2017 14:51     Assessment/Plan: MRSA complicated bacteremia = minimum needs 4 wk of vancomycin. Await to see if repeat blood cx from today are negative to see when to place picc line. Still recommend TEE to see if any vegetations.  Possible left ankle septic arthritis = underwent aspiration. Await for cx, though he has been on numerous days of IV abtx which could make cx sterile  AMS = he was started on melatonin and effexor to see if may help with symptoms  Hep C cirrhosis = continue with  daily lactulose  [facility] Cell: [phone] Pager: [phone]  03/26/2017, 5:47 PM

## 2017-03-27 ENCOUNTER — Inpatient Hospital Stay (HOSPITAL_COMMUNITY): Payer: Medicare Other

## 2017-03-27 DIAGNOSIS — K920 Hematemesis: Secondary | ICD-10-CM | POA: Diagnosis not present

## 2017-03-27 DIAGNOSIS — K766 Portal hypertension: Secondary | ICD-10-CM

## 2017-03-27 DIAGNOSIS — K7290 Hepatic failure, unspecified without coma: Secondary | ICD-10-CM

## 2017-03-27 LAB — CBC WITH DIFFERENTIAL/PLATELET
BASOS ABS: 0 10*3/uL (ref 0.0–0.1)
Basophils Relative: 0 %
EOS PCT: 1 %
Eosinophils Absolute: 0.2 10*3/uL (ref 0.0–0.7)
HEMATOCRIT: 36.7 % — AB (ref 39.0–52.0)
Hemoglobin: 12.5 g/dL — ABNORMAL LOW (ref 13.0–17.0)
LYMPHS PCT: 10 %
Lymphs Abs: 2.1 10*3/uL (ref 0.7–4.0)
MCH: 30.5 pg (ref 26.0–34.0)
MCHC: 34.1 g/dL (ref 30.0–36.0)
MCV: 89.5 fL (ref 78.0–100.0)
MONO ABS: 1.5 10*3/uL — AB (ref 0.1–1.0)
MONOS PCT: 7 %
NEUTROS ABS: 17.8 10*3/uL — AB (ref 1.7–7.7)
Neutrophils Relative %: 82 %
PLATELETS: 159 10*3/uL (ref 150–400)
RBC: 4.1 MIL/uL — ABNORMAL LOW (ref 4.22–5.81)
RDW: 16.3 % — ABNORMAL HIGH (ref 11.5–15.5)
WBC: 21.6 10*3/uL — ABNORMAL HIGH (ref 4.0–10.5)

## 2017-03-27 LAB — COMPREHENSIVE METABOLIC PANEL
ALT: 54 U/L (ref 17–63)
ANION GAP: 5 (ref 5–15)
AST: 116 U/L — ABNORMAL HIGH (ref 15–41)
Albumin: 1.7 g/dL — ABNORMAL LOW (ref 3.5–5.0)
Alkaline Phosphatase: 320 U/L — ABNORMAL HIGH (ref 38–126)
BILIRUBIN TOTAL: 8.5 mg/dL — AB (ref 0.3–1.2)
BUN: 48 mg/dL — ABNORMAL HIGH (ref 6–20)
CHLORIDE: 109 mmol/L (ref 101–111)
CO2: 20 mmol/L — ABNORMAL LOW (ref 22–32)
Calcium: 8 mg/dL — ABNORMAL LOW (ref 8.9–10.3)
Creatinine, Ser: 1.3 mg/dL — ABNORMAL HIGH (ref 0.61–1.24)
GFR, EST NON AFRICAN AMERICAN: 55 mL/min — AB (ref >60)
Glucose, Bld: 174 mg/dL — ABNORMAL HIGH (ref 65–99)
POTASSIUM: 4.8 mmol/L (ref 3.5–5.1)
Sodium: 134 mmol/L — ABNORMAL LOW (ref 135–145)
TOTAL PROTEIN: 6.5 g/dL (ref 6.5–8.1)

## 2017-03-27 LAB — GLUCOSE, CAPILLARY
GLUCOSE-CAPILLARY: 149 mg/dL — AB (ref 65–99)
GLUCOSE-CAPILLARY: 159 mg/dL — AB (ref 65–99)
Glucose-Capillary: 140 mg/dL — ABNORMAL HIGH (ref 65–99)
Glucose-Capillary: 118 mg/dL — ABNORMAL HIGH (ref 65–99)

## 2017-03-27 LAB — CBC
HCT: 37.2 % — ABNORMAL LOW (ref 39.0–52.0)
HEMOGLOBIN: 12.7 g/dL — AB (ref 13.0–17.0)
MCH: 30.8 pg (ref 26.0–34.0)
MCHC: 34.1 g/dL (ref 30.0–36.0)
MCV: 90.1 fL (ref 78.0–100.0)
PLATELETS: 170 10*3/uL (ref 150–400)
RBC: 4.13 MIL/uL — AB (ref 4.22–5.81)
RDW: 16.1 % — ABNORMAL HIGH (ref 11.5–15.5)
WBC: 22.5 10*3/uL — AB (ref 4.0–10.5)

## 2017-03-27 MED ORDER — RIFAXIMIN 550 MG PO TABS
550.0000 mg | ORAL_TABLET | Freq: Two times a day (BID) | ORAL | Status: DC
  Administered 2017-03-27 – 2017-03-29 (×5): 550 mg via ORAL
  Filled 2017-03-27 (×15): qty 1

## 2017-03-27 MED ORDER — PANTOPRAZOLE SODIUM 40 MG IV SOLR
40.0000 mg | Freq: Two times a day (BID) | INTRAVENOUS | Status: DC
Start: 2017-03-27 — End: 2017-04-03
  Administered 2017-03-27 – 2017-04-02 (×14): 40 mg via INTRAVENOUS
  Filled 2017-03-27 (×14): qty 40

## 2017-03-27 NOTE — Progress Notes (Signed)
pt coughed up dark red, thick bloody sputum. Nightshift hospitalist has been notified via text page. Will continue to monitor closely at this time

## 2017-03-27 NOTE — Consult Note (Signed)
Referring Provider: [facility]   Primary Care Physician:  Corwin LevinsJohn, James W, MD Primary Gastroenterologist:  Laurell Roofimothy Misenheimer,  MD Reason for Consultation:  Hemoptysis     Attending physician's note   I have taken a history, examined the patient and reviewed the chart. I agree with the Advanced Practitioner's note, impression and recommendations.  Decompensated cirrhosis with portal HTN, HE and history of esophageal varices last banded in 12/2016. Being treated for MRSA bacteremia. Pts history is not entirely reliable however he reports coughing not vomiting. History from RN and his wife is of coughing and hemoptysis. No one is reporting hematemesis, nausea or vomiting. Continue current mgmt and consider adding Xifaxan if HE not improved with lactulose. Consider pulmonary consult for hemoptysis and abnormal CXR. Please call us back if he has hematemesis, melena, etc. GI signing off. Outpatient GI follow up with Dr. Jennye BoroughsMisenheimer.    Claudette HeadMalcolm Stark, MD Clementeen GrahamFACG [phone] Mon-Fri 8a-5p [phone] after 5p, weekends, holidays    ASSESSMENT AND PLAN:   1. 67 yo male with decompensated HCV cirrhosis with portal HTN. Hx of esophageal banding in Oct 2018. No ascites on current imaging but confused. Bili 8.5.  [facility]. Patient [place] followed [place]. [place]. He is confused, currently admitted with leukocytosis / MRSA bacteremia.  ID following. For TEE.   -Confusion probably combination of illness and HE. On lactulose TID and having several BMs. No significant asterixis on exam today. If confusion persists consider adding Xifaxan 550 mg BID  2. Hemoptysis vr hematemesis. RN tells me patient coughing a lot. No one witnessed vomiting. Patient has red blood around his mouth, tells me he has been coughing blood but his story is unreliable given confusion. I called wife to help me with history. Patient coughing a lot at home but no hemoptysis at home. CXR just done  and reveals New right mid upper lung airspace opacity which may reflect pneumonia or hemorrhage -Patient has a hx of varices, banded in October. Currently it doesn't seem like he is vomiting blood but rather having hemoptysis. Not initiating Octreotide. IV PPI is reasonable.  -Certainly if patient has active GI bleeding then we will proceed with workup / tx.     HPI: Kurt Reid is a 67 y.o. male with DM2, bipolar disorder, GERD, and HCV cirrhosi [facility]. His GI is Dr. Jennye BoroughsMisenheimer. He was admitted this am with leukocytosis probably from UTI. He has MRSA bacteremia and ID consulted.  No vegetations on echo, he is for TEE. We were called to see after patient vomited vrs coughed up bright red blood (unwitnessed so not clear). He has blood in and around mouth. Spoke with wife, patient coughing a lot at home. CT scan on 03/17/2017 shows cirrhosis with portal HTN changes. Contrast bolus inadequate but no discrete hypervascular liver lesions seen. Patient confused, unable to get history from him.   Endoscopic History that I could locate:   EGD Oct 2018 Dr. Jennye BoroughsMisenheimer - 2 columns of moderate varices which were banded. Portal gastropathy.  EGD Dr. Jennye BoroughsMisenheimer 2016 - portal gastropathy and small esophageal varices.   EGD Dr. Jennye BoroughsMisenheimer  2013 - moderate esophageal varices and portal gastropathy.   Colonoscopy Dr. Jennye BoroughsMisenheimer in 2011. Extent of exam to cecum but prep was poor. Findings - left sided diverticulosis.    Past Medical History:  Diagnosis Date  . ADHD 09/23/2006  . ADHD (attention deficit hyperactivity disorder)   . Allergic  rhinitis   . ALLERGIC RHINITIS 09/23/2006  . Anxiety   . ANXIETY 04/19/2009  . Benign prostatic hypertrophy   . BENIGN PROSTATIC HYPERTROPHY 11/02/2007  . Bipolar 1 disorder (HCC)   . BIPOLAR AFFECTIVE DISORDER 09/23/2006  . Chronic pain syndrome 01/18/2009  . 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 Depression   . DEPRESSION 09/23/2006  .  Diabetes mellitus without complication (HCC)   . Diverticulosis of colon   . DIVERTICULOSIS, COLON 04/19/2009  . ESOPHAGEAL VARICES 04/19/2009  . ETOH abuse    hx  . GERD 09/23/2006  . GERD (gastroesophageal reflux disease)   . Hepatitis C   . HEPATITIS C 09/23/2006  . Hyperlipidemia   . HYPERLIPIDEMIA 09/23/2006  . INSOMNIA-SLEEP DISORDER-UNSPEC 04/19/2009  . Low back pain    chronic  . LOW BACK PAIN 09/23/2006  . Migraine   . MRSA (methicillin resistant staph aureus) culture positive   . Nephrolithiasis    hx  . NEPHROLITHIASIS, HX OF 11/02/2007  . SEIZURE DISORDER 09/23/2006  . Seizure disorder (HCC)    H/O  . Sinusitis    recurrent    Past Surgical History:  Procedure Laterality Date  . back surgury     x 5  . cheekbone     hx of fx  . I&D EXTREMITY Right 12/10/2013   Procedure: IRRIGATION AND DEBRIDEMENT OF MULTIPLE DOG BITES RIGHT HAND AND WRIST;  Surgeon: Dominica Severin, MD;  Location: [facility] OR;  Service: Orthopedics;  Laterality: Right;  . IR US GUIDE BX ASP/DRAIN  03/26/2017  . knee surgury     x 1  . s/p sinus surgury  2010   Dr. Zara Chess  . TONSILLECTOMY      Prior to Admission medications   Medication Sig Start Date End Date Taking? Authorizing Provider  aspirin (ASPIRIN EC) 81 MG EC tablet Take 81 mg by mouth daily.     Yes [provider]  gabapentin (NEURONTIN) 300 MG capsule TAKE 2 CAPSULES BY MOUTH TWICE DAILY 03/05/17  Yes [name], MD  HYDROcodone-acetaminophen [facility]) 10-325 MG per tablet Take 1 tablet by mouth 2 (two) times daily as needed for severe pain.    Yes [provider]  Insulin NPH Isophane & Regular (RELION 70/30 [place]) Inject into the skin. Sliding scale Dr. Lilli Light   Yes [provider]  omeprazole (PRILOSEC) 20 MG capsule Take 1 capsule (20 mg total) by mouth daily. 06/11/16  Yes [name], MD  propranolol (INDERAL) 20 MG tablet TAKE 1 TABLET BY MOUTH 2 TIMES DAILY 07/15/16  Yes [name], MD  spironolactone  (ALDACTONE) 50 MG tablet Take 50 mg by mouth 1 day or 1 dose. 07/15/16  Yes [provider]  venlafaxine (EFFEXOR) 75 MG tablet TAKE 1 TABLET BY MOUTH 3 TIMES DAILY WITH MEALS 07/15/16  Yes [name], MD  B-D UF III MINI PEN NEEDLES 31G X 5 MM MISC USE AS DIRECTED ONCE DAILY    [name], MD  chlorhexidine (HIBICLENS) 4 % external liquid Apply topically as directed. Bathe 3x/week. Avoid face and genitalia Patient not taking: Reported on 2017-04-18 07/23/16   Nche, Bonna Gains, NP  desvenlafaxine (PRISTIQ) 50 MG 24 hr tablet Take 1 tablet (50 mg total) by mouth daily. Patient not taking: Reported on 2017-04-18 05/22/16   [name], MD  fluticasone [facility]) 50 MCG/ACT nasal spray PLACE 2 SPRAYS INTO BOTH NOSTRILS DAILY. Patient not taking: Reported on April 18, 2017 02/13/15  [name], MD  glucose blood (ONE TOUCH ULTRA TEST) test strip USE AS DIRECTED THREE TIMES DAILY 04/27/15   [name], MD  Lancets Misc. (ONE [facility] SURESOFT) MISC Use as directed once per day 11/10/12   [name], MD  metFORMIN (GLUCOPHAGE-XR) 500 MG 24 hr tablet Take 2 tablets (1,000 mg total) by mouth daily with breakfast. Patient not taking: Reported on 04/07/2017 05/24/16   [name], MD  RELION INSULIN SYRINGE 1ML/31G 31G X 5/16" 1 ML MISC  05/31/16   [provider]  triamcinolone (NASACORT AQ) 55 MCG/ACT AERO nasal inhaler Place 2 sprays into the nose daily. Patient not taking: Reported on 04-Apr-2017 05/22/16   [name], MD    Current Facility-Administered Medications  Medication Dose Route Frequency Provider Last Rate Last Dose  . 0.9 %  sodium chloride infusion  250 mL Intravenous PRN Emokpae, Courage, MD      . acetaminophen (TYLENOL) tablet 650 mg  650 mg Oral Q6H PRN Emokpae, Courage, MD       Or  . acetaminophen (TYLENOL) suppository 650 mg  650 mg Rectal Q6H PRN Emokpae, Courage, MD      . albuterol (PROVENTIL) (2.5 MG/3ML) 0.083% nebulizer solution 2.5 mg  2.5 mg  Nebulization Q2H PRN Emokpae, Courage, MD      . aspirin EC tablet 81 mg  81 mg Oral Daily Emokpae, Courage, MD   81 mg at 03/27/17 0901  . Chlorhexidine Gluconate Cloth 2 % PADS 6 each  6 each Topical Q0600 Glade Lloyd, MD   6 each at 03/27/17 0509  . gabapentin (NEURONTIN) capsule 600 mg  600 mg Oral BID Shon Hale, MD   600 mg at 03/27/17 0902  . gadobenate dimeglumine (MULTIHANCE) injection 10 mL  10 mL Intravenous Once PRN Cammy Copa, MD      . guaiFENesin-dextromethorphan [facility] DM) 100-10 MG/5ML syrup 5 mL  5 mL Oral Q4H PRN Shon Hale, MD   5 mL at 03/26/17 2243  . insulin aspart (novoLOG) injection 0-15 Units  0-15 Units Subcutaneous TID WC Glade Lloyd, MD   2 Units at 03/27/17 0901  . insulin aspart (novoLOG) injection 0-5 Units  0-5 Units Subcutaneous QHS Glade Lloyd, MD   2 Units at 03/24/2017 2050  . insulin detemir (LEVEMIR) injection 15 Units  15 Units Subcutaneous QHS Glade Lloyd, MD   15 Units at 03/26/17 2126  . lactulose (CHRONULAC) 10 GM/15ML solution 30 g  30 g Oral TID Rodolph Bong, MD   30 g at 03/27/17 0901  . metoprolol tartrate (LOPRESSOR) injection 5 mg  5 mg Intravenous Once PRN Blount, Andi Devon T, NP      . multivitamin with minerals tablet 1 tablet  1 tablet Oral Daily Mariea Clonts, Courage, MD   1 tablet at 03/27/17 0901  . mupirocin ointment (BACTROBAN) 2 % 1 application  1 application Nasal BID Glade Lloyd, MD   1 application at 03/27/17 0902  . ondansetron (ZOFRAN) tablet 4 mg  4 mg Oral Q6H PRN Emokpae, Courage, MD       Or  . ondansetron (ZOFRAN) injection 4 mg  4 mg Intravenous Q6H PRN Emokpae, Courage, MD      . oxyCODONE (Oxy IR/ROXICODONE) immediate release tablet 5 mg  5 mg Oral Q4H PRN Glade Lloyd, MD   5 mg at 03/27/17 1002  . pantoprazole (PROTONIX) injection 40 mg  40 mg Intravenous Q12H Rodolph Bong, MD      .  propranolol (INDERAL) tablet 20 mg  20 mg Oral BID Shon Hale, MD   20 mg at 03/27/17 0902    . sodium chloride flush (NS) 0.9 % injection 3 mL  3 mL Intravenous Q12H Shon Hale, MD   3 mL at 03/26/17 2127  . sodium chloride flush (NS) 0.9 % injection 3 mL  3 mL Intravenous PRN Emokpae, Courage, MD      . vancomycin (VANCOCIN) 1,500 mg in sodium chloride 0.9 % 500 mL IVPB  1,500 mg Intravenous Q24H Aleda Grana, RPH 250 mL/hr at 03/27/17 0900 1,500 mg at 03/27/17 0900  . venlafaxine [facility]) tablet 150 mg  150 mg Oral Q breakfast Glade Lloyd, MD   150 mg at 03/27/17 0901  . venlafaxine (EFFEXOR) tablet 75 mg  75 mg Oral QHS Glade Lloyd, MD   75 mg at 03/26/17 2127    Allergies as of 03/26/2017 - Review Complete 03/13/2017  Allergen Reaction Noted  . Duloxetine  02/08/2010  . Latex Other (See Comments) 01/18/2009  . Levofloxacin Nausea Only 08/31/2010  . Mercury Other (See Comments) 01/18/2009  . Sulfonamide derivatives Other (See Comments) 09/23/2006    Family History  Problem Relation Age of Onset  . Dementia Father   . Stroke Mother   . Hyperlipidemia Mother   . Depression Mother   . Hypertension Mother   . Asthma Daughter   . Diabetes Neg Hx     Social History   Socioeconomic History  . Marital status: Married    Spouse name: Not on file  . Number of children: 3  . Years of education: Not on file  . Highest education level: Not on file  Social Needs  . Financial resource strain: Not on file  . Food insecurity - worry: Not on file  . Food insecurity - inability: Not on file  . Transportation needs - medical: Not on file  . Transportation needs - non-medical: Not on file  Occupational History  . Occupation: used to work for [employer], now disabled due to back pain    Employer: UNEMPLOYED  Tobacco Use  . Smoking status: Former Games developer  . Smokeless tobacco: Never Used  Substance and Sexual Activity  . Alcohol use: No  . Drug use: No  . Sexual activity: Not Currently  Other Topics Concern  . Not on file  Social History Narrative   . Not on file    Review of Systems: All systems reviewed and negative except where noted in HPI.  Physical Exam: Vital signs in last 24 hours: Temp:  [97.7 F (36.5 C)-98.3 F (36.8 C)] 97.7 F (36.5 C) (01/24 1119) Pulse Rate:  [78-88] 78 (01/24 1119) Resp:  [16-23] 18 (01/24 1119) BP: (133-138)/(77-88) 133/78 (01/24 1119) SpO2:  [94 %-97 %] 94 % (01/24 1119) Weight:  [236 lb 5.3 oz (107.2 kg)] 236 lb 5.3 oz (107.2 kg) (01/24 0548) Last BM Date: 03/16/2017 General:   Alert, well-developed, white male in NAD Psych:   cooperative.  Eyes:  Pupils equal, sclera clear, no icterus.   Conjunctiva pink. Ears:  Normal auditory acuity. Nose:  No deformity, discharge,  or lesions Mouth: red blood around and some in mouth. No obvious oral lesions. Neck:  Supple; no masses Lungs:  Non-productive cough Heart:  Regular rate and rhythm; no edema Abdomen:  Soft, non-distended, nontender, BS active   Rectal:  Deferred  Msk:  Symmetrical without gross deformities. . Neurologic:  Alert but confused. No asterixis. Skin:  Intact without significant  lesions or rashes..   Intake/Output from previous day: 01/23 0701 - 01/24 0700 In: 740 [P.O.:240; IV Piggyback:500] Out: 200 [Urine:200] Intake/Output this shift: No intake/output data recorded.  Lab Results: Recent Labs    03/05/2017 0730 03/26/17 0617 03/27/17 0631  WBC 16.8* 17.6* 21.6*  HGB 12.4* 12.2* 12.5*  HCT 36.3* 36.3* 36.7*  PLT 137* 148* 159   BMET Recent Labs    03/18/2017 0730 03/26/17 0617 03/27/17 0631  NA 133* 134* 134*  K 4.5 4.3 4.8  CL 108 107 109  CO2 21* 22 20*  GLUCOSE 276* 171* 174*  BUN 44* 42* 48*  CREATININE 1.32* 1.32* 1.30*  CALCIUM 8.1* 8.1* 8.0*   LFT Recent Labs    03/27/17 0631  PROT 6.5  ALBUMIN 1.7*  AST 116*  ALT 54  ALKPHOS 320*  BILITOT 8.5*     Studies/Results: Ir US Guide Bx Asp/drain  Result Date: 03/26/2017 INDICATION: 67 year old with left ankle swelling. Left ankle  effusion identified on MRI. EXAM: ULTRASOUND-GUIDED ASPIRATION OF LEFT ANKLE JOINT EFFUSION MEDICATIONS: None ANESTHESIA/SEDATION: None COMPLICATIONS: None immediate. PROCEDURE: Patient is confused and unable to give consent. Consent was obtained for the patient's wife via the telephone. The left ankle was evaluated with ultrasound. Ankle effusion was identified along the anterolateral aspect of the ankle. The skin was prepped with chlorhexidine. A sterile field was created. Skin was anesthetized with 1% lidocaine. Using ultrasound guidance, 18 gauge spinal needle was directed into the joint effusion. Initially 3 mL thick yellow fluid was aspirated. 2 mL of additional bloody fluid was removed. Fluid was sent for analysis. Bandage placed over the puncture site. FINDINGS: Effusion along the anterolateral aspect of the ankle joint. 5 mL of fluid was removed. IMPRESSION: Successful ultrasound-guided aspiration of the left ankle joint effusion. Electronically Signed   By: Richarda Overlie M.D.   On: 03/26/2017 14:51     Willette Cluster, NP-C @  03/27/2017, 11:40 AM  Pager number [phone]

## 2017-03-27 NOTE — Progress Notes (Signed)
.  Dear Doctor: Janee Mornhompson: This patient has been identified as a candidate for PICC for the following reason (s): IV therapy over 48 hours If you agree, please write an order for the indicated device. For any questions contact the Vascular Access Team at [phone] if no answer, please leave a message.  Thank you for supporting the early vascular access assessment program.

## 2017-03-27 NOTE — Progress Notes (Signed)
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

## 2017-03-27 NOTE — Consult Note (Signed)
Full consult to follow  [age] year old man with cirrhosis and MRSA bacteremia likely from septic left ankle.  We were consulted for hemoptysis and chest x-ray showing new right upper lobe infiltrate compared to 1/18 CT chest clarified this to be right upper lobe masslike lesion infiltrating into the rib and muscle, since this is a new finding compared to admission chest x-ray more likely to indicate acute infection rather than tumor Will coordinate with ID and IR whether  biopsy is required    Kurt Reid Annia Gomm MD. FCCP. [facility] Pager [phone] If no response call [phone]   03/27/2017

## 2017-03-27 NOTE — Progress Notes (Signed)
PROGRESS NOTE    Kurt Reid  RUE:[id number] DOB: [birth year] DOA: 03/31/2017 PCP: [name], MD    Brief Narrative:  67 year old male with history of liver cirrhosis secondary to hepatitis C, diabetes, bipolar disorder sent by PCP for evaluation of abdominal pain and confusion.  Patient was admitted with leukocytosis probably from UTI and started on antibiotics.  He was found to have MRSA bacteremia.  ID was consulted.     Assessment & Plan:   Principal Problem:   Delirium due to another medical condition Active Problems:   BIPOLAR AFFECTIVE DISORDER   Chronic pain syndrome   Esophageal varices (HCC)   Hepatic cirrhosis due to chronic hepatitis C infection (HCC)   DM type 2 without retinopathy (HCC)   Chronic hepatitis C (HCC)   Leukocytosis   Sepsis (HCC)   Altered mental status   UTI (urinary tract infection)   Ankle pain, left   Left ankle effusion  MRSA bacteremia -Questionable cause.  Repeat blood cultures from 03/23/2017 is still positive.  ID following.   2D echo negative for vegetation.  Patient was to have a TEE on 03/10/2017 however patient refused.  Patient has been seen by psychiatry and lacks capacity at this time.  RN spoke with family who will be fine signing consent for TEE.  Cardiology was consulted to see if TEE could be rearranged.  Spoke with the card master who I discussed with cardiology who feel patient is too high risk with history of esophageal varices for TEE to be done and recommended empiric treatment for endocarditis.  Patient status post left ankle aspiration per interventional radiology with preliminary cultures with rare staph aureus . Continue current empiric IV vancomycin.  ID following.  Duration of antibiotics per ID.  Probable acute metabolic encephalopathy from bacteremia/UTI/mild hepatic encephalopathy causing altered mental status -Mental status is improving but he is still confused per nursing.   -CT abdomen and pelvis showed only  small ascites; chest x-ray on admission did not show infiltrates. -Increased lactulose to 3 times daily.  Continue empiric IV antibiotics.  We will add Xifaxan.  Follow.  MRSAUTI -Urine cultures positive for MRSA.  Continue IV vancomycin  Leukocytosis -Probably secondary to  MRSA bacteremia/UTI.  Is not currently afebrile.  2D echo negative for vegetations.  TEE discussed with cardiology who feel patient is too high risk for TEE and recommending empiric treatment for endocarditis.  Continue empiric IV vancomycin.  ID following.   Decompensated liver Cirrhosiswith portal hypertension and mild hepatic encephalopathy -Increased lactulose to 3 times daily.  Continue propranolol. Outpatient follow-up with GI  -CT abdomen showed only small ascites so doubt that patient has SBP.  We will add Xifaxan as patient still with confusion.  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  No surgical intervention as per orthopedics. -fall precautions -PT evaluation -Per orthopedics.  Left ankle effusion Concern for septic arthritis.  Patient seen by orthopedics.  Patient status post aspiration of left ankle effusion per interventional radiology 03/26/2017.  Cultures with preliminary results of rare staph aureus, sensitivities pending.  Continue current regimen of IV antibiotics.  Hyponatremia -Probably from dehydration.  Follow.  Thrombocytopenia -Likely secondary to cirrhosis.  Improving.   Diabetes mellitus Hemoglobin A1c 8.7.  CBGs have ranged from 140-159.  Current regimen of Levemir 15 units daily and sliding scale insulin.  Patient with poor oral intake and as such meal coverage insulin has been discontinued.    Hemoptysis/??  Hematemesis Per RN patient noted to have some hemoptysis or questionable  hematemesis overnight.  Patient noted to have dried blood in his mouth.  No tongue lacerations or lip lacerations.  Patient with history of cirrhosis and esophageal varices and as such GI consultation was obtained.  Patient was seen in consultation by Dr. Russella DarStark, gastroenterology who feels patient symptoms are more from hemoptysis as opposed to hematemesis.  GI recommending pulmonary evaluation.  Chest x-ray has been ordered this morning which shows a new right mid upper lung airspace opacity which may reflect pneumonia or hemorrhage.  Will consult with pulmonary.  Spoke with Dr. Vassie LollAlva of pulmonary who had recommended CT chest and he will assess the patient.  Heparin has been discontinued.      DVT prophylaxis: SCDs Code Status: Full Family Communication: No family at bedside. Disposition Plan: To be determined.   Consultants:   Infectious disease: Dr. Drue SecondSnider 03/22/2017  Orthopedics Dr. August Saucerean 03/23/2017  Psychiatry: Dr. Sharma CovertNorman 03/08/2017  Pulmonary pending  Gastroenterology: Dr. Russella DarStark 03/27/2017  Procedures:   CT abdomen and pelvis November 08, 2017  Plain films of the ankle November 08, 2017, 03/23/2017  Chest x-ray November 08, 2017  MRI left ankle 03/24/2017  MRI right hip 03/22/2017  Ultrasound-guided aspiration left ankle joint effusion Per Dr. Lowella DandyHenn interventional radiology 03/26/2017  Plain films of bilateral hips with pelvis November 08, 2017  2D echo 03/22/2017  Antimicrobials:   IV vancomycin 03/22/2017>>>>>  IV Rocephin November 08, 2017>>>>> 03/24/2017   Subjective: Patient awake.  Confused.  Patient noted to have some hemoptysis per RN overnight.  Patient denies any shortness of breath.  Patient denies any chest pain.   Objective: Vitals:   03/26/17 0457 03/26/17 1343 03/26/17 2030 03/27/17 0548  BP: (!) 147/83 138/80 134/77 137/88  Pulse: 85 80 88 80  Resp: 20 16 (!) 22 (!) 23  Temp: 98.4 F (36.9 C) 98.3 F (36.8 C) 98.3 F (36.8 C) 98.1 F (36.7 C)  TempSrc: Oral Oral Oral Oral  SpO2: 94%  97% 97% 94%  Weight:    107.2 kg (236 lb 5.3 oz)  Height:        Intake/Output Summary (Last 24 hours) at 03/27/2017 1055 Last data filed at 03/26/2017 1500 Gross per 24 hour  Intake 620 ml  Output -  Net 620 ml   Filed Weights   03/24/17 0445 03/05/2017 0608 03/27/17 0548  Weight: 108 kg (238 lb 1.6 oz) 108.6 kg (239 lb 6.7 oz) 107.2 kg (236 lb 5.3 oz)    Examination:  General exam: Awake.  Mouth with some dried blood.  Jaundiced. Respiratory system: Clear to auscultation anterior lung fields.  Some crepitus right chest wall area which is raised.  Respiratory effort normal. Cardiovascular system: S1 & S2 heard, RRR. No JVD, murmurs, rubs, gallops or clicks.  1+ bilateral lower extremity edema. Gastrointestinal system: Abdomen is nondistended, soft and nontender. No organomegaly or masses felt. Normal bowel sounds heard. Central nervous system: Alert and oriented. No focal neurological deficits. Extremities: Symmetric 5 x 5 power. Skin: No rashes, lesions or ulcers Psychiatry: Unable to assess at this time.. Mood & affect appropriate.     Data Reviewed: I have personally reviewed following labs and imaging studies  CBC: Recent Labs  Lab 03/23/17 0550 03/24/17 0553 03/19/2017 0730 03/26/17 0617 03/27/17 0631  WBC 18.9* 24.2* 16.8* 17.6* 21.6*  NEUTROABS 16.5* 22.2* 13.7* 13.7* 17.8*  HGB 11.6* 12.1* 12.4* 12.2*  12.5*  HCT 34.8* 35.2* 36.3* 36.3* 36.7*  MCV 90.2 89.6 89.9 90.3 89.5  PLT 110* 146* 137* 148* 159   Basic Metabolic Panel: Recent Labs  Lab 03/23/17 0550 03/24/17 0553 04/02/17 0730 03/26/17 0617 03/27/17 0631  NA 130* 131* 133* 134* 134*  K 4.1 4.3 4.5 4.3 4.8  CL 103 107 108 107 109  CO2 21* 18* 21* 22 20*  GLUCOSE 292* 265* 276* 171* 174*  BUN 44* 42* 44* 42* 48*  CREATININE 1.59* 1.50* 1.32* 1.32* 1.30*  CALCIUM 7.6* 8.0* 8.1* 8.1* 8.0*  MG 2.1 2.1 2.2 2.2  --    GFR: Estimated Creatinine Clearance: 70.8 mL/min (A) (by C-G formula based on SCr  of 1.3 mg/dL (H)). Liver Function Tests: Recent Labs  Lab 03/23/17 0550 03/24/17 0553 04/02/17 0730 03/26/17 0617 03/27/17 0631  AST 96* 92* 72* 189* 116*  ALT 37 47 42 64* 54  ALKPHOS 204* 274* 278* 336* 320*  BILITOT 3.1* 3.5* 4.1* 5.1* 8.5*  PROT 5.5* 6.2* 6.2* 6.5 6.5  ALBUMIN 1.8* 1.9* 1.7* 1.7* 1.7*   Recent Labs  Lab 03/20/2017 1131  LIPASE 30   Recent Labs  Lab 03/29/2017 1132  AMMONIA 37*   Coagulation Profile: No results for input(s): INR, PROTIME in the last 168 hours. Cardiac Enzymes: No results for input(s): CKTOTAL, CKMB, CKMBINDEX, TROPONINI in the last 168 hours. BNP (last 3 results) No results for input(s): PROBNP in the last 8760 hours. HbA1C: No results for input(s): HGBA1C in the last 72 hours. CBG: Recent Labs  Lab 03/26/17 0750 03/26/17 1143 03/26/17 1643 03/26/17 2029 03/27/17 0746  GLUCAP 144* 149* 153* 168* 149*   Lipid Profile: No results for input(s): CHOL, HDL, LDLCALC, TRIG, CHOLHDL, LDLDIRECT in the last 72 hours. Thyroid Function Tests: No results for input(s): TSH, T4TOTAL, FREET4, T3FREE, THYROIDAB in the last 72 hours. Anemia Panel: No results for input(s): VITAMINB12, FOLATE, FERRITIN, TIBC, IRON, RETICCTPCT in the last 72 hours. Sepsis Labs: Recent Labs  Lab 03/08/2017 1313 03/25/2017 1503  LATICACIDVEN 2.90* 2.74*    Recent Results (from the past 240 hour(s))  Culture, blood (routine x 2)     Status: Abnormal   Collection Time: 03/15/2017 11:31 AM  Result Value Ref Range Status   Specimen Description BLOOD BLOOD RIGHT FOREARM  Final   Special Requests   Final    BOTTLES DRAWN AEROBIC AND ANAEROBIC Blood Culture adequate volume   Culture  Setup Time   Final    GRAM POSITIVE COCCI IN BOTH AEROBIC AND ANAEROBIC BOTTLES CRITICAL RESULT CALLED TO, READ BACK BY AND VERIFIED WITHPeggyann Juba [facility] 1610 03/22/17 A BROWNING Performed at [facility], [address], [place], [place] [address]    Culture METHICILLIN  RESISTANT STAPHYLOCOCCUS AUREUS (A)  Final   Report Status 03/24/2017 FINAL  Final   Organism ID, Bacteria METHICILLIN RESISTANT STAPHYLOCOCCUS AUREUS  Final      Susceptibility   Methicillin resistant staphylococcus aureus - MIC*    CIPROFLOXACIN <=0.5 SENSITIVE Sensitive     ERYTHROMYCIN >=8 RESISTANT Resistant     GENTAMICIN <=0.5 SENSITIVE Sensitive     OXACILLIN RESISTANT Resistant     TETRACYCLINE <=1 SENSITIVE Sensitive     VANCOMYCIN <=0.5 SENSITIVE Sensitive     TRIMETH/SULFA <=10 SENSITIVE Sensitive     CLINDAMYCIN <=0.25 SENSITIVE Sensitive     RIFAMPIN <=0.5 SENSITIVE Sensitive     Inducible Clindamycin NEGATIVE Sensitive     * METHICILLIN RESISTANT STAPHYLOCOCCUS AUREUS  Blood  Culture ID Panel (Reflexed)     Status: Abnormal   Collection Time: 03/25/2017 11:31 AM  Result Value Ref Range Status   Enterococcus species NOT DETECTED NOT DETECTED Final   Listeria monocytogenes NOT DETECTED NOT DETECTED Final   Staphylococcus species DETECTED (A) NOT DETECTED Final    Comment: CRITICAL RESULT CALLED TO, READ BACK BY AND VERIFIED WITHPeggyann Juba [facility] 1610 03/22/17 A BROWNING    Staphylococcus aureus DETECTED (A) NOT DETECTED Final    Comment: Methicillin (oxacillin)-resistant Staphylococcus aureus (MRSA). MRSA is predictably resistant to beta-lactam antibiotics (except ceftaroline). Preferred therapy is vancomycin unless clinically contraindicated. Patient requires contact precautions if  hospitalized. CRITICAL RESULT CALLED TO, READ BACK BY AND VERIFIED WITH: Peggyann Juba PH[facility] [id number] 03/22/17 A BROWNING    Methicillin resistance DETECTED (A) NOT DETECTED Final    Comment: CRITICAL RESULT CALLED TO, READ BACK BY AND VERIFIED WITH: Peggyann Juba PH[facility] [id number] 03/22/17 A BROWNING    Streptococcus species NOT DETECTED NOT DETECTED Final   Streptococcus agalactiae NOT DETECTED NOT DETECTED Final   Streptococcus pneumoniae NOT DETECTED NOT DETECTED Final   Streptococcus pyogenes NOT  DETECTED NOT DETECTED Final   Acinetobacter baumannii NOT DETECTED NOT DETECTED Final   Enterobacteriaceae species NOT DETECTED NOT DETECTED Final   Enterobacter cloacae complex NOT DETECTED NOT DETECTED Final   Escherichia coli NOT DETECTED NOT DETECTED Final   Klebsiella oxytoca NOT DETECTED NOT DETECTED Final   Klebsiella pneumoniae NOT DETECTED NOT DETECTED Final   Proteus species NOT DETECTED NOT DETECTED Final   Serratia marcescens NOT DETECTED NOT DETECTED Final   Haemophilus influenzae NOT DETECTED NOT DETECTED Final   Neisseria meningitidis NOT DETECTED NOT DETECTED Final   Pseudomonas aeruginosa NOT DETECTED NOT DETECTED Final   Candida albicans NOT DETECTED NOT DETECTED Final   Candida glabrata NOT DETECTED NOT DETECTED Final   Candida krusei NOT DETECTED NOT DETECTED Final   Candida parapsilosis NOT DETECTED NOT DETECTED Final   Candida tropicalis NOT DETECTED NOT DETECTED Final    Comment: Performed at [facility], [address], [place], [place] [address]  Culture, blood (routine x 2)     Status: Abnormal   Collection Time: 03/05/2017  1:07 PM  Result Value Ref Range Status   Specimen Description BLOOD BLOOD LEFT FOREARM  Final   Special Requests   Final    BOTTLES DRAWN AEROBIC AND ANAEROBIC Blood Culture adequate volume   Culture  Setup Time   Final    GRAM POSITIVE COCCI IN BOTH AEROBIC AND ANAEROBIC BOTTLES CRITICAL RESULT CALLED TO, READ BACK BY AND VERIFIED WITH: Sophronia Simas PHARMD, AT 4782 03/22/17 BY D. VANHOOK    Culture (A)  Final    STAPHYLOCOCCUS AUREUS SUSCEPTIBILITIES PERFORMED ON PREVIOUS CULTURE WITHIN THE LAST 5 DAYS. Performed at [facility], [address], [place], [place] [address]    Report Status 03/24/2017 FINAL  Final  Urine Culture     Status: Abnormal   Collection Time: 03/17/2017  3:36 PM  Result Value Ref Range Status   Specimen Description URINE, CLEAN CATCH  Final   Special Requests Normal  Final   Culture (A)  Final     >=100,000 COLONIES/mL METHICILLIN RESISTANT STAPHYLOCOCCUS AUREUS   Report Status 03/24/2017 FINAL  Final   Organism ID, Bacteria METHICILLIN RESISTANT STAPHYLOCOCCUS AUREUS (A)  Final      Susceptibility   Methicillin resistant staphylococcus aureus - MIC*    CIPROFLOXACIN <=0.5 SENSITIVE Sensitive  GENTAMICIN <=0.5 SENSITIVE Sensitive     NITROFURANTOIN <=16 SENSITIVE Sensitive     OXACILLIN >=4 RESISTANT Resistant     TETRACYCLINE <=1 SENSITIVE Sensitive     VANCOMYCIN <=0.5 SENSITIVE Sensitive     TRIMETH/SULFA <=10 SENSITIVE Sensitive     CLINDAMYCIN <=0.25 SENSITIVE Sensitive     RIFAMPIN <=0.5 SENSITIVE Sensitive     Inducible Clindamycin NEGATIVE Sensitive     * >=100,000 COLONIES/mL METHICILLIN RESISTANT STAPHYLOCOCCUS AUREUS  Culture, blood (routine x 2)     Status: Abnormal   Collection Time: 03/23/17  5:50 AM  Result Value Ref Range Status   Specimen Description BLOOD LEFT HAND  Final   Special Requests IN PEDIATRIC BOTTLE Blood Culture adequate volume  Final   Culture  Setup Time   Final    GRAM POSITIVE COCCI IN PEDIATRIC BOTTLE CRITICAL RESULT CALLED TO, READ BACK BY AND VERIFIED WITH: NICK GLOGOVAC AT 0743 ON [id number] BY SJW    Culture (A)  Final    STAPHYLOCOCCUS AUREUS SUSCEPTIBILITIES PERFORMED ON PREVIOUS CULTURE WITHIN THE LAST 5 DAYS. Performed at [facility], [address], [place], [place] [address]    Report Status 03/26/2017 FINAL  Final  Culture, blood (routine x 2)     Status: Abnormal   Collection Time: 03/23/17  5:50 AM  Result Value Ref Range Status   Specimen Description BLOOD LEFT ANTECUBITAL  Final   Special Requests IN PEDIATRIC BOTTLE Blood Culture adequate volume  Final   Culture  Setup Time   Final    GRAM POSITIVE COCCI IN PEDIATRIC BOTTLE CRITICAL RESULT CALLED TO, READ BACK BY AND VERIFIED WITH: NICK GLOGOVAC PHARMD AT 0741 ON [id number] BY SJW    Culture (A)  Final    STAPHYLOCOCCUS AUREUS SUSCEPTIBILITIES PERFORMED ON  PREVIOUS CULTURE WITHIN THE LAST 5 DAYS. Performed at [facility], [address], [place], [place] [address]    Report Status 03/26/2017 FINAL  Final  MRSA PCR Screening     Status: Abnormal   Collection Time: 03/23/2017  8:10 AM  Result Value Ref Range Status   MRSA by PCR POSITIVE (A) NEGATIVE Final    Comment:        The GeneXpert MRSA Assay (FDA approved for NASAL specimens only), is one component of a comprehensive MRSA colonization surveillance program. It is not intended to diagnose MRSA infection nor to guide or monitor treatment for MRSA infections. RESULT CALLED TO, READ BACK BY AND VERIFIED WITH: B.KAUR RN 1042 [id number] [email or web address]   Culture, blood (Routine X 2) w Reflex to ID Panel     Status: None (Preliminary result)   Collection Time: 03/26/17 10:14 AM  Result Value Ref Range Status   Specimen Description BLOOD RIGHT ANTECUBITAL  Final   Special Requests   Final    BOTTLES DRAWN AEROBIC AND ANAEROBIC Blood Culture adequate volume   Culture   Final    NO GROWTH < 24 HOURS Performed at [facility], [address], [place], [place] [address]    Report Status PENDING  Incomplete  Culture, blood (Routine X 2) w Reflex to ID Panel     Status: None (Preliminary result)   Collection Time: 03/26/17 10:14 AM  Result Value Ref Range Status   Specimen Description BLOOD LEFT ANTECUBITAL  Final   Special Requests   Final    BOTTLES DRAWN AEROBIC AND ANAEROBIC Blood Culture adequate volume   Culture   Final    NO GROWTH <  24 HOURS Performed at [facility], [address] [place], [place] [address]    Report Status PENDING  Incomplete  Body fluid culture     Status: None (Preliminary result)   Collection Time: 03/26/17  1:04 PM  Result Value Ref Range Status   Specimen Description ANKLE SYNOVIAL  Final   Special Requests NONE  Final   Gram Stain   Final    NO ORGANISMS SEEN WBC PRESENT, PREDOMINANTLY PMN Gram Stain Report Called to,Read Back By and  Verified With: D.BLOCK RN AT 1412 ON 03/26/16 BY S.VANHOORNE    Culture   Final    CULTURE REINCUBATED FOR BETTER GROWTH Performed at [facility], [address], [place], [place] [address]    Report Status PENDING  Incomplete         Radiology Studies: Ir US Guide Bx Asp/drain  Result Date: 03/26/2017 INDICATION: 67 year old with left ankle swelling. Left ankle effusion identified on MRI. EXAM: ULTRASOUND-GUIDED ASPIRATION OF LEFT ANKLE JOINT EFFUSION MEDICATIONS: None ANESTHESIA/SEDATION: None COMPLICATIONS: None immediate. PROCEDURE: Patient is confused and unable to give consent. Consent was obtained for the patient's wife via the telephone. The left ankle was evaluated with ultrasound. Ankle effusion was identified along the anterolateral aspect of the ankle. The skin was prepped with chlorhexidine. A sterile field was created. Skin was anesthetized with 1% lidocaine. Using ultrasound guidance, 18 gauge spinal needle was directed into the joint effusion. Initially 3 mL thick yellow fluid was aspirated. 2 mL of additional bloody fluid was removed. Fluid was sent for analysis. Bandage placed over the puncture site. FINDINGS: Effusion along the anterolateral aspect of the ankle joint. 5 mL of fluid was removed. IMPRESSION: Successful ultrasound-guided aspiration of the left ankle joint effusion. Electronically Signed   By: Richarda Overlie M.D.   On: 03/26/2017 14:51        Scheduled Meds: . aspirin EC  81 mg Oral Daily  . Chlorhexidine Gluconate Cloth  6 each Topical Q0600  . gabapentin  600 mg Oral BID  . heparin  5,000 Units Subcutaneous Q8H  . insulin aspart  0-15 Units Subcutaneous TID WC  . insulin aspart  0-5 Units Subcutaneous QHS  . insulin detemir  15 Units Subcutaneous QHS  . lactulose  30 g Oral TID  . multivitamin with minerals  1 tablet Oral Daily  . mupirocin ointment  1 application Nasal BID  . pantoprazole  40 mg Oral Daily  . propranolol  20 mg Oral BID  .  sodium chloride flush  3 mL Intravenous Q12H  . venlafaxine  150 mg Oral Q breakfast  . venlafaxine  75 mg Oral QHS   Continuous Infusions: . sodium chloride    . vancomycin 1,500 mg (03/27/17 0900)     LOS: 6 days    Time spent: 35 mins    Ramiro Harvest, MD [facility] Pager [phone]  If 7PM-7AM, please contact night-coverage [email or web address] Password [facility] 03/27/2017, 10:55 AM

## 2017-03-28 ENCOUNTER — Encounter (HOSPITAL_COMMUNITY): Payer: Self-pay | Admitting: Pulmonary Disease

## 2017-03-28 ENCOUNTER — Inpatient Hospital Stay (HOSPITAL_COMMUNITY): Payer: Medicare Other | Admitting: Certified Registered Nurse Anesthetist

## 2017-03-28 ENCOUNTER — Encounter (HOSPITAL_COMMUNITY): Admission: EM | Disposition: E | Payer: Self-pay | Source: Home / Self Care | Attending: Internal Medicine

## 2017-03-28 DIAGNOSIS — E119 Type 2 diabetes mellitus without complications: Secondary | ICD-10-CM

## 2017-03-28 DIAGNOSIS — R918 Other nonspecific abnormal finding of lung field: Secondary | ICD-10-CM

## 2017-03-28 DIAGNOSIS — M00072 Staphylococcal arthritis, left ankle and foot: Secondary | ICD-10-CM

## 2017-03-28 DIAGNOSIS — I2690 Septic pulmonary embolism without acute cor pulmonale: Secondary | ICD-10-CM

## 2017-03-28 DIAGNOSIS — R042 Hemoptysis: Secondary | ICD-10-CM

## 2017-03-28 DIAGNOSIS — M009 Pyogenic arthritis, unspecified: Secondary | ICD-10-CM

## 2017-03-28 HISTORY — PX: KNEE ARTHROSCOPY: SHX127

## 2017-03-28 LAB — COMPREHENSIVE METABOLIC PANEL
ALT: 55 U/L (ref 17–63)
ANION GAP: 7 (ref 5–15)
AST: 123 U/L — ABNORMAL HIGH (ref 15–41)
Albumin: 1.5 g/dL — ABNORMAL LOW (ref 3.5–5.0)
Alkaline Phosphatase: 296 U/L — ABNORMAL HIGH (ref 38–126)
BILIRUBIN TOTAL: 9.2 mg/dL — AB (ref 0.3–1.2)
BUN: 52 mg/dL — ABNORMAL HIGH (ref 6–20)
CALCIUM: 8 mg/dL — AB (ref 8.9–10.3)
CO2: 21 mmol/L — ABNORMAL LOW (ref 22–32)
Chloride: 108 mmol/L (ref 101–111)
Creatinine, Ser: 1.18 mg/dL (ref 0.61–1.24)
GFR calc Af Amer: >60 mL/min (ref >60)
Glucose, Bld: 150 mg/dL — ABNORMAL HIGH (ref 65–99)
Potassium: 5 mmol/L (ref 3.5–5.1)
Sodium: 136 mmol/L (ref 135–145)
TOTAL PROTEIN: 6.5 g/dL (ref 6.5–8.1)

## 2017-03-28 LAB — CBC WITH DIFFERENTIAL/PLATELET
BASOS PCT: 0 %
Basophils Absolute: 0 10*3/uL (ref 0.0–0.1)
EOS PCT: 0 %
Eosinophils Absolute: 0 10*3/uL (ref 0.0–0.7)
HEMATOCRIT: 37.2 % — AB (ref 39.0–52.0)
Hemoglobin: 12.5 g/dL — ABNORMAL LOW (ref 13.0–17.0)
LYMPHS ABS: 1.5 10*3/uL (ref 0.7–4.0)
Lymphocytes Relative: 5 %
MCH: 30.5 pg (ref 26.0–34.0)
MCHC: 33.6 g/dL (ref 30.0–36.0)
MCV: 90.7 fL (ref 78.0–100.0)
MONOS PCT: 6 %
Monocytes Absolute: 1.7 10*3/uL — ABNORMAL HIGH (ref 0.1–1.0)
NEUTROS ABS: 25.9 10*3/uL — AB (ref 1.7–7.7)
Neutrophils Relative %: 89 %
Platelets: 160 10*3/uL (ref 150–400)
RBC: 4.1 MIL/uL — ABNORMAL LOW (ref 4.22–5.81)
RDW: 16.5 % — AB (ref 11.5–15.5)
WBC: 29.1 10*3/uL — ABNORMAL HIGH (ref 4.0–10.5)

## 2017-03-28 LAB — GLUCOSE, CAPILLARY
GLUCOSE-CAPILLARY: 127 mg/dL — AB (ref 65–99)
Glucose-Capillary: 155 mg/dL — ABNORMAL HIGH (ref 65–99)
Glucose-Capillary: 142 mg/dL — ABNORMAL HIGH (ref 65–99)
Glucose-Capillary: 145 mg/dL — ABNORMAL HIGH (ref 65–99)

## 2017-03-28 LAB — AFP TUMOR MARKER: AFP, SERUM, TUMOR MARKER: 1 ng/mL (ref 0.0–8.3)

## 2017-03-28 SURGERY — ARTHROSCOPY, KNEE
Anesthesia: General | Site: Ankle | Laterality: Left

## 2017-03-28 MED ORDER — FENTANYL CITRATE (PF) 100 MCG/2ML IJ SOLN
INTRAMUSCULAR | Status: DC | PRN
  Administered 2017-03-28: 25 ug via INTRAVENOUS
  Administered 2017-03-28 (×3): 50 ug via INTRAVENOUS
  Administered 2017-03-28: 25 ug via INTRAVENOUS
  Administered 2017-03-28: 50 ug via INTRAVENOUS

## 2017-03-28 MED ORDER — METOCLOPRAMIDE HCL 5 MG PO TABS: 5.0000 mg | ORAL_TABLET | Freq: Three times a day (TID) | ORAL | Status: DC | PRN

## 2017-03-28 MED ORDER — ACETAMINOPHEN 10 MG/ML IV SOLN
INTRAVENOUS | Status: AC
  Filled 2017-03-28: qty 100

## 2017-03-28 MED ORDER — SUCCINYLCHOLINE CHLORIDE 200 MG/10ML IV SOSY
PREFILLED_SYRINGE | INTRAVENOUS | Status: AC
  Filled 2017-03-28: qty 10

## 2017-03-28 MED ORDER — 0.9 % SODIUM CHLORIDE (POUR BTL) OPTIME
TOPICAL | Status: DC | PRN
  Administered 2017-03-28: 1000 mL

## 2017-03-28 MED ORDER — LIDOCAINE 2% (20 MG/ML) 5 ML SYRINGE
INTRAMUSCULAR | Status: DC | PRN
  Administered 2017-03-28: 80 mg via INTRAVENOUS

## 2017-03-28 MED ORDER — ONDANSETRON HCL 4 MG/2ML IJ SOLN
4.0000 mg | Freq: Four times a day (QID) | INTRAMUSCULAR | Status: DC | PRN
  Administered 2017-03-29: 4 mg via INTRAVENOUS
  Filled 2017-03-28: qty 2

## 2017-03-28 MED ORDER — PROPOFOL 10 MG/ML IV BOLUS
INTRAVENOUS | Status: DC | PRN
  Administered 2017-03-28: 30 mg via INTRAVENOUS
  Administered 2017-03-28: 100 mg via INTRAVENOUS

## 2017-03-28 MED ORDER — VANCOMYCIN HCL 1000 MG IV SOLR
INTRAVENOUS | Status: AC
  Filled 2017-03-28: qty 1000

## 2017-03-28 MED ORDER — ACETAMINOPHEN 650 MG RE SUPP: 650.0000 mg | RECTAL | Status: DC | PRN

## 2017-03-28 MED ORDER — OXYCODONE HCL 5 MG PO TABS
10.0000 mg | ORAL_TABLET | ORAL | Status: DC | PRN
  Administered 2017-03-28 – 2017-03-29 (×5): 10 mg via ORAL
  Filled 2017-03-28 (×5): qty 2

## 2017-03-28 MED ORDER — ONDANSETRON HCL 4 MG/2ML IJ SOLN
INTRAMUSCULAR | Status: AC
  Filled 2017-03-28: qty 2

## 2017-03-28 MED ORDER — ONDANSETRON HCL 4 MG/2ML IJ SOLN
INTRAMUSCULAR | Status: DC | PRN
  Administered 2017-03-28: 4 mg via INTRAVENOUS

## 2017-03-28 MED ORDER — FENTANYL CITRATE (PF) 250 MCG/5ML IJ SOLN
INTRAMUSCULAR | Status: AC
  Filled 2017-03-28: qty 5

## 2017-03-28 MED ORDER — MEPERIDINE HCL 50 MG/ML IJ SOLN: 6.2500 mg | INTRAMUSCULAR | Status: DC | PRN

## 2017-03-28 MED ORDER — MIDAZOLAM HCL 2 MG/2ML IJ SOLN
INTRAMUSCULAR | Status: AC
  Filled 2017-03-28: qty 2

## 2017-03-28 MED ORDER — ACETAMINOPHEN 10 MG/ML IV SOLN: 1000.0000 mg | Freq: Once | INTRAVENOUS | Status: DC | PRN

## 2017-03-28 MED ORDER — HALOPERIDOL LACTATE 5 MG/ML IJ SOLN
5.0000 mg | Freq: Once | INTRAMUSCULAR | Status: AC
  Administered 2017-03-28: 5 mg via INTRAVENOUS
  Filled 2017-03-28: qty 1

## 2017-03-28 MED ORDER — KETAMINE HCL 10 MG/ML IJ SOLN
INTRAMUSCULAR | Status: AC
  Filled 2017-03-28: qty 1

## 2017-03-28 MED ORDER — PROMETHAZINE HCL 25 MG/ML IJ SOLN: 6.2500 mg | INTRAMUSCULAR | Status: DC | PRN

## 2017-03-28 MED ORDER — ACETAMINOPHEN 325 MG PO TABS: 650.0000 mg | ORAL_TABLET | ORAL | Status: DC | PRN

## 2017-03-28 MED ORDER — METOCLOPRAMIDE HCL 5 MG/ML IJ SOLN: 5.0000 mg | Freq: Three times a day (TID) | INTRAMUSCULAR | Status: DC | PRN

## 2017-03-28 MED ORDER — PROPOFOL 10 MG/ML IV BOLUS
INTRAVENOUS | Status: AC
  Filled 2017-03-28: qty 20

## 2017-03-28 MED ORDER — LIDOCAINE 2% (20 MG/ML) 5 ML SYRINGE
INTRAMUSCULAR | Status: AC
  Filled 2017-03-28: qty 5

## 2017-03-28 MED ORDER — SODIUM CHLORIDE 0.9 % IJ SOLN
INTRAMUSCULAR | Status: DC | PRN
  Administered 2017-03-28: 40 mL via INTRAVENOUS

## 2017-03-28 MED ORDER — HYDROCODONE-ACETAMINOPHEN 7.5-325 MG PO TABS: 1.0000 | ORAL_TABLET | Freq: Once | ORAL | Status: DC | PRN

## 2017-03-28 MED ORDER — SODIUM CHLORIDE 0.9 % IJ SOLN
INTRAMUSCULAR | Status: AC
  Filled 2017-03-28: qty 50

## 2017-03-28 MED ORDER — SODIUM CHLORIDE 0.9 % IJ SOLN
INTRAMUSCULAR | Status: AC
  Filled 2017-03-28: qty 20

## 2017-03-28 MED ORDER — VANCOMYCIN HCL 1000 MG IV SOLR
INTRAVENOUS | Status: DC | PRN
  Administered 2017-03-28: 1000 mg

## 2017-03-28 MED ORDER — HYDROMORPHONE HCL 1 MG/ML IJ SOLN
INTRAMUSCULAR | Status: AC
  Filled 2017-03-28: qty 2

## 2017-03-28 MED ORDER — HYDROCODONE-ACETAMINOPHEN 10-325 MG PO TABS: 1.0000 | ORAL_TABLET | Freq: Two times a day (BID) | ORAL | Status: DC | PRN

## 2017-03-28 MED ORDER — SODIUM CHLORIDE 0.9 % IV SOLN
INTRAVENOUS | Status: DC | PRN
  Administered 2017-03-28: 15:00:00 via INTRAVENOUS

## 2017-03-28 MED ORDER — SODIUM CHLORIDE 0.9 % IV SOLN
1750.0000 mg | INTRAVENOUS | Status: DC
  Administered 2017-03-28: 1750 mg via INTRAVENOUS
  Filled 2017-03-28 (×2): qty 1750

## 2017-03-28 MED ORDER — HYDROMORPHONE HCL 1 MG/ML IJ SOLN
0.2500 mg | INTRAMUSCULAR | Status: DC | PRN
  Administered 2017-03-28 (×3): 0.5 mg via INTRAVENOUS

## 2017-03-28 MED ORDER — LACTATED RINGERS IV SOLN: INTRAVENOUS | Status: DC

## 2017-03-28 MED ORDER — ONDANSETRON HCL 4 MG PO TABS: 4.0000 mg | ORAL_TABLET | Freq: Four times a day (QID) | ORAL | Status: DC | PRN

## 2017-03-28 SURGICAL SUPPLY — 34 items
BANDAGE ACE 6X5 VEL STRL LF (GAUZE/BANDAGES/DRESSINGS) IMPLANT
BLADE CLIPPER SURG (BLADE) IMPLANT
BLADE CUDA 5.5 (BLADE) IMPLANT
BLADE GREAT WHITE 4.2 (BLADE) IMPLANT
BLADE GREAT WHITE 4.2MM (BLADE)
BLADE SURG SZ11 CARB STEEL (BLADE) ×3 IMPLANT
BNDG ELASTIC 6X10 VLCR STRL LF (GAUZE/BANDAGES/DRESSINGS) ×3 IMPLANT
BUR OVAL 6.0 (BURR) IMPLANT
CUFF TOURN SGL QUICK 34 (TOURNIQUET CUFF)
CUFF TRNQT CYL 34X4X40X1 (TOURNIQUET CUFF) IMPLANT
DRAIN PENROSE LF 8X20.3CM SIL (WOUND CARE) ×3 IMPLANT
DRAPE INCISE IOBAN 66X45 STRL (DRAPES) IMPLANT
DRAPE U-SHAPE 47X51 STRL (DRAPES) ×3 IMPLANT
DRSG PAD ABDOMINAL 8X10 ST (GAUZE/BANDAGES/DRESSINGS) IMPLANT
DURAPREP 26ML APPLICATOR (WOUND CARE) IMPLANT
GAUZE SPONGE 4X4 12PLY STRL (GAUZE/BANDAGES/DRESSINGS) ×3 IMPLANT
GAUZE XEROFORM 1X8 LF (GAUZE/BANDAGES/DRESSINGS) ×3 IMPLANT
GLOVE SURG ORTHO 8.0 STRL STRW (GLOVE) IMPLANT
GOWN STRL REUS W/ TWL XL LVL3 (GOWN DISPOSABLE) IMPLANT
GOWN STRL REUS W/TWL LRG LVL3 (GOWN DISPOSABLE) ×6 IMPLANT
GOWN STRL REUS W/TWL XL LVL3 (GOWN DISPOSABLE)
KIT BASIN OR (CUSTOM PROCEDURE TRAY) IMPLANT
PACK ARTHROSCOPY WL (CUSTOM PROCEDURE TRAY) ×3 IMPLANT
PADDING CAST COTTON 6X4 STRL (CAST SUPPLIES) ×3 IMPLANT
POSITIONER SURGICAL ARM (MISCELLANEOUS) ×3 IMPLANT
SPONGE LAP 4X18 X RAY DECT (DISPOSABLE) IMPLANT
STAPLER VISISTAT 35W (STAPLE) IMPLANT
SUT ETHIBOND CT1 BRD #0 30IN (SUTURE) IMPLANT
SUT ETHILON 3 0 PS 1 (SUTURE) ×3 IMPLANT
SUT VIC AB 2-0 CT1 27 (SUTURE)
SUT VIC AB 2-0 CT1 TAPERPNT 27 (SUTURE) IMPLANT
TOWEL OR 17X26 10 PK STRL BLUE (TOWEL DISPOSABLE) ×3 IMPLANT
TUBING ARTHRO INFLOW-ONLY STRL (TUBING) ×3 IMPLANT
WAND HAND CNTRL MULTIVAC 90 (MISCELLANEOUS) IMPLANT

## 2017-03-28 NOTE — Anesthesia Preprocedure Evaluation (Addendum)
Anesthesia Evaluation  Patient identified by MRN, date of birth, ID band Patient confused    Reviewed: Allergy & Precautions, NPO status , Patient's Chart, lab work & pertinent test results  Airway Mallampati: III  TM Distance: >3 FB Neck ROM: Full    Dental  (+) Edentulous Upper, Edentulous Lower   Pulmonary neg pulmonary ROS, former smoker,    Pulmonary exam normal        Cardiovascular negative cardio ROS   Rhythm:Regular Rate:Normal     Neuro/Psych Seizures -,  Bipolar Disorder negative neurological ROS  negative psych ROS   GI/Hepatic negative GI ROS, Neg liver ROS, GERD  ,(+) Hepatitis -, C  Endo/Other  negative endocrine ROSdiabetes  Renal/GU negative Renal ROS     Musculoskeletal negative musculoskeletal ROS (+)   Abdominal   Peds negative pediatric ROS (+)  Hematology negative hematology ROS (+)   Anesthesia Other Findings   Reproductive/Obstetrics                            Lab Results  Component Value Date   WBC 29.1 (H) 03/19/2017   HGB 12.5 (L) 03/10/2017   HCT 37.2 (L) 03/27/2017   MCV 90.7 03/06/2017   PLT 160 03/27/2017   Lab Results  Component Value Date   CREATININE 1.18 03/08/2017   BUN 52 (H) 03/15/2017   NA 136 04/01/2017   K 5.0 03/18/2017   CL 108 03/26/2017   CO2 21 (L) 03/05/2017    Anesthesia Physical Anesthesia Plan  ASA: IV  Anesthesia Plan: General   Post-op Pain Management:    Induction: Intravenous  PONV Risk Score and Plan: Treatment may vary due to age or medical condition and Ondansetron  Airway Management Planned: LMA  Additional Equipment:   Intra-op Plan:   Post-operative Plan:   Informed Consent: I have reviewed the patients History and Physical, chart, labs and discussed the procedure including the risks, benefits and alternatives for the proposed anesthesia with the patient or authorized representative who has indicated  his/her understanding and acceptance.     Plan Discussed with: CRNA and Anesthesiologist  Anesthesia Plan Comments:        Anesthesia Quick Evaluation

## 2017-03-28 NOTE — Progress Notes (Signed)
[facility]    Date of Admission:  03/18/2017   Total days of antibiotics 8        Day 8 vanco        Day 2 rifaximin           ID: Kurt Reid is a 67 y.o. male with treated hep c cirrhosis admitted for MRSA bacteremia, found to have left ankle septic arthritis plus new right lung abscess with associated osteo/chest wall abscess Principal Problem:   Delirium due to another medical condition Active Problems:   BIPOLAR AFFECTIVE DISORDER   Chronic pain syndrome   Esophageal varices (HCC)   Hepatic cirrhosis due to chronic hepatitis C infection (HCC)   Hemoptysis   DM type 2 without retinopathy (HCC)   Chronic hepatitis C (HCC)   Leukocytosis   Sepsis (HCC)   Altered mental status   UTI (urinary tract infection)   Ankle pain, left   Left ankle effusion   Hematemesis    Subjective: Afebrile, just returned from OR from I X D of left ankle Yesterday had some hemoptysis that prompted getting chest CT -  He also underwent chest CT yesterday which revealed new right sided upper lung abscess with extention to rib and chest wall  Medications:  . aspirin EC  81 mg Oral Daily  . Chlorhexidine Gluconate Cloth  6 each Topical Q0600  . gabapentin  600 mg Oral BID  . HYDROmorphone      . insulin aspart  0-15 Units Subcutaneous TID WC  . insulin aspart  0-5 Units Subcutaneous QHS  . insulin detemir  15 Units Subcutaneous QHS  . lactulose  30 g Oral TID  . multivitamin with minerals  1 tablet Oral Daily  . mupirocin ointment  1 application Nasal BID  . pantoprazole (PROTONIX) IV  40 mg Intravenous Q12H  . propranolol  20 mg Oral BID  . rifaximin  550 mg Oral BID  . sodium chloride flush  3 mL Intravenous Q12H  . venlafaxine  150 mg Oral Q breakfast  . venlafaxine  75 mg Oral QHS    Objective: Vital signs in last 24 hours: Temp:  [97.7 F (36.5 C)-98.5 F (36.9 C)] 98.3 F (36.8 C) (01/25 1720) Pulse Rate:  [81-91] 91 (01/25 1720) Resp:   [12-20] 14 (01/25 1720) BP: (122-149)/(69-106) 137/69 (01/25 1720) SpO2:  [95 %-98 %] 95 % (01/25 1720) Weight:  [233 lb 4 oz (105.8 kg)] 233 lb 4 oz (105.8 kg) (01/25 0434) Physical Exam  Constitutional: He is sleeping. He appears chronically ill and well-nourished. No distress.  HENT:  Mouth/Throat: Oropharynx is dry. No oropharyngeal exudate.  Cardiovascular: Normal rate, regular rhythm and normal heart sounds. Exam reveals no gallop and no friction rub.  No murmur heard.  Chest wall = asymmetry above nipple line, not fluctuant or erythematous Pulmonary/Chest: Effort normal and breath sounds normal. No respiratory distress. He has no wheezes.  Abdominal: Soft. Bowel sounds are decrease. There is distension. There is no tenderness.  Lymphadenopathy:  He has no cervical adenopathy.  Skin: Skin is warm and dry. No rash noted. No erythema. Spider angiomata on chest. Possible petechiae to toes of left foot.scattered echymosis to forearms   Lab Results Recent Labs    03/27/17 0631 03/27/17 1332 12/27/2017 0600  WBC 21.6* 22.5* 29.1*  HGB 12.5* 12.7* 12.5*  HCT 36.7* 37.2* 37.2*  NA 134*  --  136  K 4.8  --  5.0  CL 109  --  108  CO2 20*  --  21*  BUN 48*  --  52*  CREATININE 1.30*  --  1.18   Liver Panel Recent Labs    03/27/17 0631 03/05/2017 0600  PROT 6.5 6.5  ALBUMIN 1.7* 1.5*  AST 116* 123*  ALT 54 55  ALKPHOS 320* 296*  BILITOT 8.5* 9.2*    Microbiology: 1/23 blood cx ngtd at 48hrs Studies/Results: Ct Chest Wo Contrast  Result Date: 03/27/2017 CLINICAL DATA:  Hemoptysis, abdominal pain, confusion, cirrhosis secondary to hepatitis C, diabetes mellitus, leukocytosis, UTI, MRSA bacteremia EXAM: CT CHEST WITHOUT CONTRAST TECHNIQUE: Multidetector CT imaging of the chest was performed following the standard protocol without IV contrast. Sagittal and coronal MPR images reconstructed from axial data set. 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 Scattered atherosclerotic calcifications aorta and coronary arteries. No pericardial effusion. Mediastinum/Nodes: Esophagus unremarkable. Scattered normal size mediastinal lymph nodes without thoracic adenopathy. Base of cervical region unremarkable. Lungs/Pleura: Dependent atelectasis RIGHT lower lobe. Area of opacity at the lateral aspect of the RIGHT upper lobe anteriorly, 4.8 x 4.5 x 3.0 cm. Minimal surrounding infiltrate. This process extends into the anterolateral RIGHT chest wall with destruction of the RIGHT third rib company by pathologic fracture. Additionally, the RIGHT upper lobe process is contiguous through the chest wall with a subpectoral gas and fluid collection external to the ribs in the anterolateral upper RIGHT chest, 10.3 x 3.2 cm in greatest axial dimensions and extending for 5 cm length. Bronchiectasis and atelectasis at anterior base of RIGHT middle lobe. Remaining lungs clear. Upper Abdomen: Cirrhotic liver with multiple perigastric and perisplenic varices in the upper abdomen with a spontaneous splenorenal shunt better demonstrated on CT abdomen. Moderate ascites. Large area of developing low attenuation within the central spleen question splenic infarct versus developing splenic abscess new since 03/06/2017. Distended gallbladder. Musculoskeletal: No additional osseous findings. IMPRESSION: Consolidation at the anterolateral RIGHT upper lobe measuring 4.8 x 4.5 x 3.0 cm in size, contiguous with infiltrative process through the anterolateral RIGHT chest wall extending into the extra osseous soft tissues deep to the RIGHT pectoralis muscle where a gas and fluid collection is seen measuring 10.3 x 3.2 x 5.0 cm. Findings may either represent RIGHT upper lobe pneumonia with extension of infection through the chest wall associated with osteomyelitis of the anterolateral RIGHT third rib and accompanying chest  wall abscess or RIGHT upper lobe tumor extending involving the RIGHT upper lobe and chest wall. The lack of RIGHT upper lobe opacity or RIGHT third rib destruction on chest radiograph at presentation on 03/10/2017 makes infection (pneumonia with chest wall extension and subpectoral abscess) a more likely etiology than tumor. Cirrhotic liver with numerous varices in the upper abdomen and a new area of low attenuation within the central spleen which could represent an infarct or developing infection/abscess. Electronically Signed   By: Ulyses Southward M.D.   On: 03/27/2017 16:09   Dg Chest Port 1 View  Result Date: 03/27/2017 CLINICAL DATA:  Subcutaneous emphysema.  Hemoptysis. EXAM: PORTABLE CHEST 1 VIEW COMPARISON:  04/01/2017 FINDINGS: The cardiomediastinal silhouette is unchanged. Heart size is within normal limits. Thoracic aorta is tortuous. Chronic bronchitic changes are again noted. There is new hazy airspace opacity in the lateral right mid to upper lung. The left lung remains clear. No sizable pleural effusion or pneumothorax is identified. There is persistent mild elevation of the right hemidiaphragm. IMPRESSION: New  right mid upper lung airspace opacity which may reflect pneumonia or hemorrhage. Electronically Signed   By: Sebastian Ache M.D.   On: 03/27/2017 12:15     Assessment/Plan: Disseminated MRSA infection = patient thought to be too high risk for TEE but also found to have likely septic emboli to lungs - which now has developed into abscess with extension to chest wall. Agree with seeing if CT surgery would evaluate patient and see if patient would benefit from I x D of chest wall.unusual presentation that has erupted while on IV Vancomycin since admit, especially since admit CXR was fairly clear.  Continue on vancomycin for now.  Burden of disease has also been decreased since he had his left ankle I x D'd  Leukocytosis = likely from underlying infection. Still concern that it has been  trending down. Anticipate it can go down since he had his ankle debrided but the chest wall fluid collection/affected area looks fairly large.   Cirrhosis with ams = continue with lactulose and rifaximin  Dr Daiva Eves to see over the weekend   [facility] Cell: [phone] Pager: [phone]  03/24/2017, 5:33 PM

## 2017-03-28 NOTE — Progress Notes (Signed)
Left ankle pos for rare staph aureus Plan I and d today Will have drain to be dced tomorrow From record will be on iv abx for a while He is at risk for limb loss if infection can not be eradicated

## 2017-03-28 NOTE — Anesthesia Procedure Notes (Signed)
Procedure Name: LMA Insertion Date/Time: 04/01/2017 3:30 PM Performed by: Florene Routeeardon, Ardell Aaronson L, CRNA Patient Re-evaluated:Patient Re-evaluated prior to induction Oxygen Delivery Method: Circle system utilized Preoxygenation: Pre-oxygenation with 100% oxygen Induction Type: IV induction LMA: LMA with gastric port inserted LMA Size: 4.0 Number of attempts: 1 Placement Confirmation: positive ETCO2 and breath sounds checked- equal and bilateral Tube secured with: Tape Dental Injury: Teeth and Oropharynx as per pre-operative assessment

## 2017-03-28 NOTE — Anesthesia Procedure Notes (Signed)
Date/Time: 03/06/2017 4:21 PM Performed by: Minerva EndsMirarchi, Sofie Schendel M, CRNA Oxygen Delivery Method: Simple face mask Placement Confirmation: positive ETCO2 and breath sounds checked- equal and bilateral Dental Injury: Teeth and Oropharynx as per pre-operative assessment

## 2017-03-28 NOTE — Progress Notes (Signed)
PROGRESS NOTE    RONOLD HARDGROVE  EXB:[id number] DOB: Apr 25, 1950 DOA: 04/02/2017 PCP: [name], MD    Brief Narrative:  67 year old male with history of liver cirrhosis secondary to hepatitis C, diabetes, bipolar disorder sent by PCP for evaluation of abdominal pain and confusion.  Patient was admitted with leukocytosis probably from UTI and started on antibiotics.  He was found to have MRSA bacteremia.  ID was consulted.     Assessment & Plan:   Principal Problem:   Delirium due to another medical condition Active Problems:   BIPOLAR AFFECTIVE DISORDER   Chronic pain syndrome   Esophageal varices (HCC)   Hepatic cirrhosis due to chronic hepatitis C infection (HCC)   Hemoptysis   DM type 2 without retinopathy (HCC)   Chronic hepatitis C (HCC)   Leukocytosis   Sepsis (HCC)   Altered mental status   UTI (urinary tract infection)   Ankle pain, left   Left ankle effusion   Hematemesis  MRSA bacteremia -Questionable cause.  May have seeded from ankle. Repeat blood cultures from 03/23/2017 positive.  Blood cultures from 03/26/2017 still negative. ID following.  2D echo negative for vegetation.  Patient was to have a TEE on 03/14/2017 however patient refused.  Patient has been seen by psychiatry and lacks capacity at this time.  RN spoke with family who will be fine signing consent for TEE.  Cardiology was consulted to see if TEE could be rearranged.  Spoke with the card master who I discussed with cardiology who feel patient is too high risk with history of esophageal varices for TEE to be done and recommended empiric treatment for endocarditis.  She will likely need 6 weeks of IV antibiotics or per ID recommendations.  Patient status post left ankle aspiration per interventional radiology with preliminary cultures with rare staph aureus . Continue current empiric IV vancomycin.  ID following.  Duration of antibiotics per ID.  Probable acute metabolic encephalopathy from  bacteremia/UTI/mild hepatic encephalopathy causing altered mental status -Mental status is improving but he is still confused per nursing.   -CT abdomen and pelvis showed only small ascites; chest x-ray on admission did not show infiltrates. -Increased lactulose to 3 times daily.  Xifaxan started yesterday.  Continue empiric IV antibiotics. Follow.  MRSAUTI -Urine cultures positive for MRSA.  Continue IV vancomycin  Leukocytosis -Probably secondary to  MRSA bacteremia/UTI.  Worsening leukocytosis.  Patient with right upper lobe mass with infiltrate into rib and muscle concerning for possible abscess which may be contributing to patient's worsening leukocytosis.  Currently afebrile.  2D echo negative for vegetations.  TEE discussed with cardiology who feel patient is too high risk for TEE and recommending empiric treatment for endocarditis.  Continue empiric IV vancomycin.  ID following.   Decompensated liver Cirrhosiswith portal hypertension and mild hepatic encephalopathy -Increased lactulose to 3 times daily.  Patient started on Xifaxan.  Continue propranolol. Outpatient follow-up with GI  -CT abdomen showed only small ascites so doubt that patient has SBP.   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  No surgical intervention as per orthopedics. -fall precautions -PT evaluation -Per orthopedics.  Left ankle effusion/??  Septic arthritis Concern for septic arthritis.  Patient seen by orthopedics.  Patient status post aspiration of left ankle effusion per interventional radiology 03/26/2017.  Cultures with preliminary results of rare  staph aureus, sensitivities pending.  Continue current regimen of IV antibiotics.  Per orthopedics.  Hyponatremia -Probably from dehydration.  Improved.   Follow.  Thrombocytopenia -Likely secondary to cirrhosis.  Improved.   Diabetes mellitus Hemoglobin A1c 8.7.  CBGs have ranged from 127-155.  Continue current regimen of Levemir 15 units daily and sliding scale insulin.  Patient with poor oral intake and as such meal coverage insulin has been discontinued.    Hemoptysis/??  Hematemesis Per RN patient noted to have some hemoptysis or questionable hematemesis overnight.  Patient noted to have dried blood in his mouth.  No tongue lacerations or lip lacerations.  Patient with history of cirrhosis and esophageal varices and as such GI consultation was obtained.  Patient was seen in consultation by Dr. Russella Dar, gastroenterology who feels patient symptoms are more from hemoptysis as opposed to hematemesis.  GI recommending pulmonary evaluation.  Chest x-ray has been ordered this morning which shows a new right mid upper lung airspace opacity which may reflect pneumonia or hemorrhage.  Will consult with pulmonary.  Spoke with Dr. Vassie Loll of pulmonary who had recommended CT chest which was done which showed a right pictorial abscess and right upper lobe masslike opacity eroding into rib/muscle with concern for possible septic emboli.  Pulmonary recommended monitoring of hemoptysis and following H&H.  General surgery/CVTS evaluation.    Right upper lobe masslike lesion--infiltrates into rib/muscle/right upper lobe infiltrate Noted on CT chest.  Patient with persistent leukocytosis and clinical deterioration.  Patient seen in consultation by pulmonary as well as general surgery.  General surgery assessed patient evaluated CT scan and recommended evaluation by cardiothoracic surgery for further management.  Cardiothoracic surgery consultation pending.      DVT prophylaxis: SCDs Code Status: Full Family Communication: No family at bedside. Disposition Plan: To be determined.   Consultants:   Infectious disease: Dr. Drue Second 03/22/2017  Orthopedics Dr. August Saucer  03/23/2017  Psychiatry: Dr. Sharma Covert 03/24/2017  Pulmonary : Dr. Vassie Loll 03/24/2017  Gastroenterology: Dr. Russella Dar 03/27/2017  General surgery: Dr. Johna Sheriff 03/09/2017  Procedures:   CT abdomen and pelvis 03/19/2017  Plain films of the ankle 03/06/2017, 03/23/2017  Chest x-ray 04/02/2017, 03/27/2017  MRI left ankle 03/24/2017  MRI right hip 03/22/2017  Ultrasound-guided aspiration left ankle joint effusion Per Dr. Lowella Dandy interventional radiology 03/26/2017  Plain films of bilateral hips with pelvis 03/11/2017  2D echo 03/22/2017   CT chest 03/27/2017  Antimicrobials:   IV vancomycin 03/22/2017>>>>>  IV Rocephin 03/15/2017>>>>> 03/24/2017   Subjective: Patient awake.  Patient alert to self and place only.  Patient with also another episode of hemoptysis today.  Patient denies any shortness of breath.  Patient denies any chest pain.   Objective: Vitals:   03/27/17 1119 03/27/17 1423 03/27/17 2201 03/27/2017 0434  BP: 133/78 124/81 137/80 132/70  Pulse: 78 77  81  Resp: 18 (!) 22 20 20   Temp: 97.7 F (36.5 C) 98.5 F (36.9 C) 97.7 F (36.5 C) 98.5 F (36.9 C)  TempSrc: Oral Oral Oral Oral  SpO2: 94% 97% 98% 95%  Weight:    105.8 kg (233 lb 4 oz)  Height:        Intake/Output Summary (Last 24 hours) at 03/25/2017 1431 Last data filed at 03/27/2017 2135 Gross per 24 hour  Intake -  Output 200 ml  Net -200 ml   Filed Weights   03/30/2017 0608 03/27/17 0548 03/15/2017 0434  Weight: 108.6 kg (239 lb 6.7 oz) 107.2 kg (236 lb 5.3 oz) 105.8 kg (233 lb  4 oz)    Examination:  General exam: Awake.  Jaundiced. Respiratory system: Clear to auscultation bilaterally.  No wheezes, no crackles, no rhonchi.  Some crepitus right chest wall area on the lateral area, which is raised.  Respiratory effort normal. Cardiovascular system: Regular rate and rhythm no murmurs rubs or gallops.  No JVD.  1+ bilateral lower extremity edema. Gastrointestinal system: Abdomen is soft, nontender, nondistended,  positive bowel sounds.  No hepatosplenomegaly.  Central nervous system: Alert and oriented. No focal neurological deficits. Extremities: Symmetric 5 x 5 power. Skin: No rashes, lesions or ulcers Psychiatry: Unable to assess at this time.. Mood & affect appropriate.     Data Reviewed: I have personally reviewed following labs and imaging studies  CBC: Recent Labs  Lab 03/24/17 0553 03/07/2017 0730 03/26/17 0617 03/27/17 0631 03/27/17 1332 03/23/2017 0600  WBC 24.2* 16.8* 17.6* 21.6* 22.5* 29.1*  NEUTROABS 22.2* 13.7* 13.7* 17.8*  --  25.9*  HGB 12.1* 12.4* 12.2* 12.5* 12.7* 12.5*  HCT 35.2* 36.3* 36.3* 36.7* 37.2* 37.2*  MCV 89.6 89.9 90.3 89.5 90.1 90.7  PLT 146* 137* 148* 159 170 160   Basic Metabolic Panel: Recent Labs  Lab 03/23/17 0550 03/24/17 0553 03/04/2017 0730 03/26/17 0617 03/27/17 0631 03/10/2017 0600  NA 130* 131* 133* 134* 134* 136  K 4.1 4.3 4.5 4.3 4.8 5.0  CL 103 107 108 107 109 108  CO2 21* 18* 21* 22 20* 21*  GLUCOSE 292* 265* 276* 171* 174* 150*  BUN 44* 42* 44* 42* 48* 52*  CREATININE 1.59* 1.50* 1.32* 1.32* 1.30* 1.18  CALCIUM 7.6* 8.0* 8.1* 8.1* 8.0* 8.0*  MG 2.1 2.1 2.2 2.2  --   --    GFR: Estimated Creatinine Clearance: 77.6 mL/min (by C-G formula based on SCr of 1.18 mg/dL). Liver Function Tests: Recent Labs  Lab 03/24/17 0553 04/02/2017 0730 03/26/17 0617 03/27/17 0631 03/29/2017 0600  AST 92* 72* 189* 116* 123*  ALT 47 42 64* 54 55  ALKPHOS 274* 278* 336* 320* 296*  BILITOT 3.5* 4.1* 5.1* 8.5* 9.2*  PROT 6.2* 6.2* 6.5 6.5 6.5  ALBUMIN 1.9* 1.7* 1.7* 1.7* 1.5*   No results for input(s): LIPASE, AMYLASE in the last 168 hours. No results for input(s): AMMONIA in the last 168 hours. Coagulation Profile: No results for input(s): INR, PROTIME in the last 168 hours. Cardiac Enzymes: No results for input(s): CKTOTAL, CKMB, CKMBINDEX, TROPONINI in the last 168 hours. BNP (last 3 results) No results for input(s): PROBNP in the last 8760  hours. HbA1C: No results for input(s): HGBA1C in the last 72 hours. CBG: Recent Labs  Lab 03/27/17 1152 03/27/17 1630 03/27/17 2206 03/27/2017 0758 03/20/2017 1208  GLUCAP 159* 140* 118* 127* 155*   Lipid Profile: No results for input(s): CHOL, HDL, LDLCALC, TRIG, CHOLHDL, LDLDIRECT in the last 72 hours. Thyroid Function Tests: No results for input(s): TSH, T4TOTAL, FREET4, T3FREE, THYROIDAB in the last 72 hours. Anemia Panel: No results for input(s): VITAMINB12, FOLATE, FERRITIN, TIBC, IRON, RETICCTPCT in the last 72 hours. Sepsis Labs: Recent Labs  Lab 03/31/2017 1503  LATICACIDVEN 2.74*    Recent Results (from the past 240 hour(s))  Culture, blood (routine x 2)     Status: Abnormal   Collection Time: 03/20/2017 11:31 AM  Result Value Ref Range Status   Specimen Description BLOOD BLOOD RIGHT FOREARM  Final   Special Requests   Final    BOTTLES DRAWN AEROBIC AND ANAEROBIC Blood Culture adequate volume   Culture  Setup Time  Final    GRAM POSITIVE COCCI IN BOTH AEROBIC AND ANAEROBIC BOTTLES CRITICAL RESULT CALLED TO, READ BACK BY AND VERIFIED WITHPeggyann Juba [facility] 1610 03/22/17 A BROWNING Performed at [facility], [address], [place], [place] [address]    Culture METHICILLIN RESISTANT STAPHYLOCOCCUS AUREUS (A)  Final   Report Status 03/24/2017 FINAL  Final   Organism ID, Bacteria METHICILLIN RESISTANT STAPHYLOCOCCUS AUREUS  Final      Susceptibility   Methicillin resistant staphylococcus aureus - MIC*    CIPROFLOXACIN <=0.5 SENSITIVE Sensitive     ERYTHROMYCIN >=8 RESISTANT Resistant     GENTAMICIN <=0.5 SENSITIVE Sensitive     OXACILLIN RESISTANT Resistant     TETRACYCLINE <=1 SENSITIVE Sensitive     VANCOMYCIN <=0.5 SENSITIVE Sensitive     TRIMETH/SULFA <=10 SENSITIVE Sensitive     CLINDAMYCIN <=0.25 SENSITIVE Sensitive     RIFAMPIN <=0.5 SENSITIVE Sensitive     Inducible Clindamycin NEGATIVE Sensitive     * METHICILLIN RESISTANT STAPHYLOCOCCUS AUREUS    Blood Culture ID Panel (Reflexed)     Status: Abnormal   Collection Time: 03/20/2017 11:31 AM  Result Value Ref Range Status   Enterococcus species NOT DETECTED NOT DETECTED Final   Listeria monocytogenes NOT DETECTED NOT DETECTED Final   Staphylococcus species DETECTED (A) NOT DETECTED Final    Comment: CRITICAL RESULT CALLED TO, READ BACK BY AND VERIFIED WITHPeggyann Juba PHARMD 4098 03/22/17 A BROWNING    Staphylococcus aureus DETECTED (A) NOT DETECTED Final    Comment: Methicillin (oxacillin)-resistant Staphylococcus aureus (MRSA). MRSA is predictably resistant to beta-lactam antibiotics (except ceftaroline). Preferred therapy is vancomycin unless clinically contraindicated. Patient requires contact precautions if  hospitalized. CRITICAL RESULT CALLED TO, READ BACK BY AND VERIFIED WITH: Peggyann Juba PHARMD 1191 03/22/17 A BROWNING    Methicillin resistance DETECTED (A) NOT DETECTED Final    Comment: CRITICAL RESULT CALLED TO, READ BACK BY AND VERIFIED WITH: Peggyann Juba PHARMD 4782 03/22/17 A BROWNING    Streptococcus species NOT DETECTED NOT DETECTED Final   Streptococcus agalactiae NOT DETECTED NOT DETECTED Final   Streptococcus pneumoniae NOT DETECTED NOT DETECTED Final   Streptococcus pyogenes NOT DETECTED NOT DETECTED Final   Acinetobacter baumannii NOT DETECTED NOT DETECTED Final   Enterobacteriaceae species NOT DETECTED NOT DETECTED Final   Enterobacter cloacae complex NOT DETECTED NOT DETECTED Final   Escherichia coli NOT DETECTED NOT DETECTED Final   Klebsiella oxytoca NOT DETECTED NOT DETECTED Final   Klebsiella pneumoniae NOT DETECTED NOT DETECTED Final   Proteus species NOT DETECTED NOT DETECTED Final   Serratia marcescens NOT DETECTED NOT DETECTED Final   Haemophilus influenzae NOT DETECTED NOT DETECTED Final   Neisseria meningitidis NOT DETECTED NOT DETECTED Final   Pseudomonas aeruginosa NOT DETECTED NOT DETECTED Final   Candida albicans NOT DETECTED NOT DETECTED Final    Candida glabrata NOT DETECTED NOT DETECTED Final   Candida krusei NOT DETECTED NOT DETECTED Final   Candida parapsilosis NOT DETECTED NOT DETECTED Final   Candida tropicalis NOT DETECTED NOT DETECTED Final    Comment: Performed at [facility], [address], [place], [place] [address]  Culture, blood (routine x 2)     Status: Abnormal   Collection Time: 04/02/2017  1:07 PM  Result Value Ref Range Status   Specimen Description BLOOD BLOOD LEFT FOREARM  Final   Special Requests   Final    BOTTLES DRAWN AEROBIC AND ANAEROBIC Blood Culture adequate volume   Culture  Setup Time  Final    GRAM POSITIVE COCCI IN BOTH AEROBIC AND ANAEROBIC BOTTLES CRITICAL RESULT CALLED TO, READ BACK BY AND VERIFIED WITH: Sophronia Simas PHARMD, AT 9147 03/22/17 BY D. VANHOOK    Culture (A)  Final    STAPHYLOCOCCUS AUREUS SUSCEPTIBILITIES PERFORMED ON PREVIOUS CULTURE WITHIN THE LAST 5 DAYS. Performed at [facility], [address], [place], [place] [address]    Report Status 03/24/2017 FINAL  Final  Urine Culture     Status: Abnormal   Collection Time: 04/02/2017  3:36 PM  Result Value Ref Range Status   Specimen Description URINE, CLEAN CATCH  Final   Special Requests Normal  Final   Culture (A)  Final    >=100,000 COLONIES/mL METHICILLIN RESISTANT STAPHYLOCOCCUS AUREUS   Report Status 03/24/2017 FINAL  Final   Organism ID, Bacteria METHICILLIN RESISTANT STAPHYLOCOCCUS AUREUS (A)  Final      Susceptibility   Methicillin resistant staphylococcus aureus - MIC*    CIPROFLOXACIN <=0.5 SENSITIVE Sensitive     GENTAMICIN <=0.5 SENSITIVE Sensitive     NITROFURANTOIN <=16 SENSITIVE Sensitive     OXACILLIN >=4 RESISTANT Resistant     TETRACYCLINE <=1 SENSITIVE Sensitive     VANCOMYCIN <=0.5 SENSITIVE Sensitive     TRIMETH/SULFA <=10 SENSITIVE Sensitive     CLINDAMYCIN <=0.25 SENSITIVE Sensitive     RIFAMPIN <=0.5 SENSITIVE Sensitive     Inducible Clindamycin NEGATIVE Sensitive     * >=100,000  COLONIES/mL METHICILLIN RESISTANT STAPHYLOCOCCUS AUREUS  Culture, blood (routine x 2)     Status: Abnormal   Collection Time: 03/23/17  5:50 AM  Result Value Ref Range Status   Specimen Description BLOOD LEFT HAND  Final   Special Requests IN PEDIATRIC BOTTLE Blood Culture adequate volume  Final   Culture  Setup Time   Final    GRAM POSITIVE COCCI IN PEDIATRIC BOTTLE CRITICAL RESULT CALLED TO, READ BACK BY AND VERIFIED WITH: NICK GLOGOVAC AT 0743 ON [id number] BY SJW    Culture (A)  Final    STAPHYLOCOCCUS AUREUS SUSCEPTIBILITIES PERFORMED ON PREVIOUS CULTURE WITHIN THE LAST 5 DAYS. Performed at [facility], [address], [place], [place] [address]    Report Status 03/26/2017 FINAL  Final  Culture, blood (routine x 2)     Status: Abnormal   Collection Time: 03/23/17  5:50 AM  Result Value Ref Range Status   Specimen Description BLOOD LEFT ANTECUBITAL  Final   Special Requests IN PEDIATRIC BOTTLE Blood Culture adequate volume  Final   Culture  Setup Time   Final    GRAM POSITIVE COCCI IN PEDIATRIC BOTTLE CRITICAL RESULT CALLED TO, READ BACK BY AND VERIFIED WITH: NICK GLOGOVAC PHARMD AT 0741 ON [id number] BY SJW    Culture (A)  Final    STAPHYLOCOCCUS AUREUS SUSCEPTIBILITIES PERFORMED ON PREVIOUS CULTURE WITHIN THE LAST 5 DAYS. Performed at [facility], [address], [place], [place] [address]    Report Status 03/26/2017 FINAL  Final  MRSA PCR Screening     Status: Abnormal   Collection Time: 03/19/2017  8:10 AM  Result Value Ref Range Status   MRSA by PCR POSITIVE (A) NEGATIVE Final    Comment:        The GeneXpert MRSA Assay (FDA approved for NASAL specimens only), is one component of a comprehensive MRSA colonization surveillance program. It is not intended to diagnose MRSA infection nor to guide or monitor treatment for MRSA infections. RESULT CALLED TO, READ BACK BY AND  VERIFIED WITH: B.KAUR RN [phone] [id number] A.QUIZON   Culture, blood (Routine X 2) w Reflex  to ID Panel     Status: None (Preliminary result)   Collection Time: 03/26/17 10:14 AM  Result Value Ref Range Status   Specimen Description BLOOD RIGHT ANTECUBITAL  Final   Special Requests   Final    BOTTLES DRAWN AEROBIC AND ANAEROBIC Blood Culture adequate volume   Culture   Final    NO GROWTH 2 DAYS Performed at [facility], [address], [place], [place] [address]    Report Status PENDING  Incomplete  Culture, blood (Routine X 2) w Reflex to ID Panel     Status: None (Preliminary result)   Collection Time: 03/26/17 10:14 AM  Result Value Ref Range Status   Specimen Description BLOOD LEFT ANTECUBITAL  Final   Special Requests   Final    BOTTLES DRAWN AEROBIC AND ANAEROBIC Blood Culture adequate volume   Culture   Final    NO GROWTH 2 DAYS Performed at [facility], [address] [place], [place] [address]    Report Status PENDING  Incomplete  Body fluid culture     Status: None (Preliminary result)   Collection Time: 03/26/17  1:04 PM  Result Value Ref Range Status   Specimen Description ANKLE SYNOVIAL  Final   Special Requests NONE  Final   Gram Stain   Final    NO ORGANISMS SEEN WBC PRESENT, PREDOMINANTLY PMN Gram Stain Report Called to,Read Back By and Verified With: D.BLOCK RN AT 1412 ON 03/26/16 BY S.VANHOORNE    Culture   Final    RARE STAPHYLOCOCCUS AUREUS SUSCEPTIBILITIES TO FOLLOW RESULT CALLED TO, READ BACK BY AND VERIFIED WITH: D BLOCK RN AT 1335 ON [id number] BY SJW Performed at [facility], [address], [place], [place] [address]    Report Status PENDING  Incomplete  Anaerobic culture     Status: None (Preliminary result)   Collection Time: 03/26/17  1:04 PM  Result Value Ref Range Status   Specimen Description ANKLE SYNOVIAL  Final   Special Requests NONE  Final   Culture   Final    NO ANAEROBES ISOLATED; CULTURE IN PROGRESS FOR 5 DAYS   Report Status PENDING  Incomplete         Radiology Studies: Ct Chest Wo  Contrast  Result Date: 03/27/2017 CLINICAL DATA:  Hemoptysis, abdominal pain, confusion, cirrhosis secondary to hepatitis C, diabetes mellitus, leukocytosis, UTI, MRSA bacteremia EXAM: CT CHEST WITHOUT CONTRAST TECHNIQUE: Multidetector CT imaging of the chest was performed following the standard protocol without IV contrast. Sagittal and coronal MPR images reconstructed from axial data set. 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 Scattered atherosclerotic calcifications aorta and coronary arteries. No pericardial effusion. Mediastinum/Nodes: Esophagus unremarkable. Scattered normal size mediastinal lymph nodes without thoracic adenopathy. Base of cervical region unremarkable. Lungs/Pleura: Dependent atelectasis RIGHT lower lobe. Area of opacity at the lateral aspect of the RIGHT upper lobe anteriorly, 4.8 x 4.5 x 3.0 cm. Minimal surrounding infiltrate. This process extends into the anterolateral RIGHT chest wall with destruction of the RIGHT third rib company by pathologic fracture. Additionally, the RIGHT upper lobe process is contiguous through the chest wall with a subpectoral gas and fluid collection external to the ribs in the anterolateral upper RIGHT chest, 10.3 x 3.2 cm in greatest axial dimensions and extending for 5 cm length. Bronchiectasis  and atelectasis at anterior base of RIGHT middle lobe. Remaining lungs clear. Upper Abdomen: Cirrhotic liver with multiple perigastric and perisplenic varices in the upper abdomen with a spontaneous splenorenal shunt better demonstrated on CT abdomen. Moderate ascites. Large area of developing low attenuation within the central spleen question splenic infarct versus developing splenic abscess new since 03/29/2017. Distended gallbladder. Musculoskeletal: No additional osseous findings. IMPRESSION: Consolidation at the anterolateral RIGHT upper lobe measuring 4.8 x  4.5 x 3.0 cm in size, contiguous with infiltrative process through the anterolateral RIGHT chest wall extending into the extra osseous soft tissues deep to the RIGHT pectoralis muscle where a gas and fluid collection is seen measuring 10.3 x 3.2 x 5.0 cm. Findings may either represent RIGHT upper lobe pneumonia with extension of infection through the chest wall associated with osteomyelitis of the anterolateral RIGHT third rib and accompanying chest wall abscess or RIGHT upper lobe tumor extending involving the RIGHT upper lobe and chest wall. The lack of RIGHT upper lobe opacity or RIGHT third rib destruction on chest radiograph at presentation on 03/10/2017 makes infection (pneumonia with chest wall extension and subpectoral abscess) a more likely etiology than tumor. Cirrhotic liver with numerous varices in the upper abdomen and a new area of low attenuation within the central spleen which could represent an infarct or developing infection/abscess. Electronically Signed   By: Ulyses SouthwardMark  Boles M.D.   On: 03/27/2017 16:09   Dg Chest Port 1 View  Result Date: 03/27/2017 CLINICAL DATA:  Subcutaneous emphysema.  Hemoptysis. EXAM: PORTABLE CHEST 1 VIEW COMPARISON:  03/11/2017 FINDINGS: The cardiomediastinal silhouette is unchanged. Heart size is within normal limits. Thoracic aorta is tortuous. Chronic bronchitic changes are again noted. There is new hazy airspace opacity in the lateral right mid to upper lung. The left lung remains clear. No sizable pleural effusion or pneumothorax is identified. There is persistent mild elevation of the right hemidiaphragm. IMPRESSION: New right mid upper lung airspace opacity which may reflect pneumonia or hemorrhage. Electronically Signed   By: Sebastian AcheAllen  Grady M.D.   On: 03/27/2017 12:15        Scheduled Meds: . aspirin EC  81 mg Oral Daily  . Chlorhexidine Gluconate Cloth  6 each Topical Q0600  . gabapentin  600 mg Oral BID  . insulin aspart  0-15 Units Subcutaneous TID WC   . insulin aspart  0-5 Units Subcutaneous QHS  . insulin detemir  15 Units Subcutaneous QHS  . lactulose  30 g Oral TID  . multivitamin with minerals  1 tablet Oral Daily  . mupirocin ointment  1 application Nasal BID  . pantoprazole (PROTONIX) IV  40 mg Intravenous Q12H  . propranolol  20 mg Oral BID  . rifaximin  550 mg Oral BID  . sodium chloride flush  3 mL Intravenous Q12H  . venlafaxine  150 mg Oral Q breakfast  . venlafaxine  75 mg Oral QHS   Continuous Infusions: . sodium chloride    . lactated ringers    . vancomycin       LOS: 7 days    Time spent: 35 mins    Ramiro Harvestaniel Thompson, MD [facility] Pager [phone]  If 7PM-7AM, please contact night-coverage [email or web address] Password [facility] 03/23/2017, 2:31 PM

## 2017-03-28 NOTE — Anesthesia Postprocedure Evaluation (Signed)
Anesthesia Post Note  Patient: Kurt Reid  Procedure(s) Performed: IRRIGATION AND DEBRIDIMENT LEFT ANKLE (Left Ankle)     Patient location during evaluation: PACU Anesthesia Type: General Level of consciousness: awake and alert Pain management: pain level controlled Vital Signs Assessment: post-procedure vital signs reviewed and stable Respiratory status: spontaneous breathing, nonlabored ventilation, respiratory function stable and patient connected to nasal cannula oxygen Cardiovascular status: blood pressure returned to baseline and stable Postop Assessment: no apparent nausea or vomiting Anesthetic complications: no    Last Vitals:  Vitals:   03/29/2017 1700 03/26/2017 1720  BP: 122/69 137/69  Pulse: 87 91  Resp: 12 14  Temp: 36.9 C 36.8 C  SpO2: 95% 95%    Last Pain:  Vitals:   03/17/2017 1700  TempSrc:   PainSc: Asleep                 Kurt Reid

## 2017-03-28 NOTE — Transfer of Care (Signed)
Immediate Anesthesia Transfer of Care Note  Patient: Kurt Reid  Procedure(s) Performed: IRRIGATION AND DEBRIDIMENT LEFT ANKLE (Left Ankle)  Patient Location: PACU  Anesthesia Type:General  Level of Consciousness: sedated  Airway & Oxygen Therapy: Patient Spontanous Breathing and Patient connected to face mask oxygen  Post-op Assessment: Report given to RN and Post -op Vital signs reviewed and stable  Post vital signs: Reviewed and stable  Last Vitals:  Vitals:   03/27/17 2201 03/19/2017 0434  BP: 137/80 132/70  Pulse:  81  Resp: 20 20  Temp: 36.5 C 36.9 C  SpO2: 98% 95%    Last Pain:  Vitals:   04/01/2017 0800  TempSrc:   PainSc: 7       Patients Stated Pain Goal: 2 (03/19/2017 1727)  Complications: No apparent anesthesia complications

## 2017-03-28 NOTE — Brief Op Note (Signed)
05-Aug-2017  4:24 PM  PATIENT:  Kurt Reid  67 y.o. male  PRE-OPERATIVE DIAGNOSIS:  infected left ankle  POST-OPERATIVE DIAGNOSIS:  infected left ankle  PROCEDURE:  Procedure(s): IRRIGATION AND DEBRIDIMENT LEFT ANKLE  SURGEON:  Surgeon(s): Cammy Copaean, Gregory Scott, MD  ASSISTANT: none  ANESTHESIA:   general  EBL: 3 ml    Total I/O In: -  Out: 5 [Blood:5]  BLOOD ADMINISTERED: none  DRAINS: Penrose drain in the left ankle   LOCAL MEDICATIONS USED:  none  SPECIMEN: cxs x 1  COUNTS:  YES  TOURNIQUET:  * Missing tourniquet times found for documented tourniquets in log: [id number] *  DICTATION: .Other Dictation: Dictation Number [id number]  PLAN OF CARE: Admit to inpatient   PATIENT DISPOSITION:  PACU - hemodynamically stable

## 2017-03-28 NOTE — Clinical Social Work Note (Signed)
Clinical Social Work Assessment  Patient Details  Name: Kurt Reid MRN: [id number] Date of Birth: 1950-12-02  Date of referral:  03/31/2017               Reason for consult:  Facility Placement                Permission sought to share information with:  Family Supports, Magazine features editoracility Contact Representative, Case Estate manager/land agentManager Permission granted to share information::  (Spouse gave permission)  Name::     Radiation protection practitionerlizabeth   Agency::  SNFs  Relationship::  Spouse  Contact Information:     Housing/Transportation Living arrangements for the past 2 months:  Single Family Home Source of Information:  Spouse Patient Interpreter Needed:  None Criminal Activity/Legal Involvement Pertinent to Current Situation/Hospitalization:  No - Comment as needed Significant Relationships:  Spouse Lives with:  Spouse Do you feel safe going back to the place where you live?  Yes Need for family participation in patient care:  Yes (Comment)  Care giving concerns:  No care giving concerns at the time of assessment.   Social Worker assessment / plan:  LCSW following for SNF placement.   Patient admitted for Delirium due to another medical condition.  LCSW attempted to meet with patient. RN notified LCSW that patient was not oriented and to contact patient's spouse.  LCSW spoke with patients wife by phone.   Patient wife, Kurt Reid reported that patient is independent in his ADLs at home. According to [facility], patient ambulates without a walker and is able to bathe and dress himself. Spouse reports that patieent does not drive. Patient is able to feed himself and uses the microwave. Patient spouse states that she is responsible for most meals.   PLAN: Patient and spouse are agreeable to SNF at [place].    Employment status:  Retired Database administratornsurance information:  Managed Medicare PT Recommendations:  Skilled Nursing Facility Information / Referral to community resources:     Patient/Family's Response to care:  Non  addressed during assessment.   Patient/Family's Understanding of and Emotional Response to Diagnosis, Current Treatment, and Prognosis:  Patient and family are understanding of diagnosis and understanding of treatment plan.   Emotional Assessment Appearance:  Appears stated age Attitude/Demeanor/Rapport:  Unable to Assess Affect (typically observed):  Unable to Assess Orientation:  Oriented to Self Alcohol / Substance use:  Not Applicable Psych involvement (Current and /or in the community):  No (Comment)  Discharge Needs  Concerns to be addressed:  No discharge needs identified Readmission within the last 30 days:    Current discharge risk:  None Barriers to Discharge:  Continued Medical Work up, English as a second language teachernsurance Authorization, No SNF bed   Kurt LakeBernette Engelmann, LCSW 04/02/2017, 3:16 PM

## 2017-03-28 NOTE — Progress Notes (Signed)
Date: March 28, 2017 Marcelle SmilingRhonda Sidnee Gambrill, BSN, [place]RN3, [place]CCM [phone] Chart and notes review for patient progress and needs.septic left ankle, pulmonary mass-pna/ Will follow for case management and discharge needs. No cm or discharge needs present at time of this review. Next review date: [id number]

## 2017-03-28 NOTE — Consult Note (Signed)
Name: Kurt Reid MRN: [id number] DOB: 09/30/1950    ADMISSION DATE:  14-Sep-2017 CONSULTATION DATE: 03/15/2017  REFERRING MD :  Dr. Janee Mornhompson / [facility]   CHIEF COMPLAINT:  Hemoptysis    HISTORY OF PRESENT ILLNESS:  67 y/o M who [facility] with reports of confusion, abdominal and hip pain.    He has chronic liver disease in the setting of Hep C+ with cirrhosis / varices.  [facility] and is followed in [place] for GI.  Wife reported at a PCP office visit on 1/18 that [facility].  Work up at that time included "normal labs and gallbladder ultrasound".  He was planned for a HIDA scan.  The patient was referred to the ER by his PCP.  In the ER his wife also reported left hip pain, increased confusion, jaundice, decreased PO intake and inability to take lactulose. He also had a two week history of left ankle pain, swelling and low grade fevers.    Initial ER evaluation raised concern for possible infection but no clear source identified.  He was admitted for further evaluation per [facility].  CT of the abd/pelvis 1/18 demonstrated morphologic changes of advanced cirrhosis with evidence of portal hypertension, numerous non-obstructing renal calculi but no evidence of obstruction or UTI, colonic diverticulosis without acute diverticulitis. UA was concerning for possible UTI.  Blood cultures obtained grew MRSA as did urine culture (likely from bacteremia, not source). He was treated with IV antibiotics.  Follow up blood cultures on 1/20 remained positive for staph.  Cultures from 1/23 are pending.  He underwent a left ankle aspiration which also grew staph.  This was the suspected source of infection.  On 1/24 he developed hemoptysis. This was followed up with a CT of the chest which showed a right pectoral abscess and RUL mass like opacity concerning for septic embolism.   CCM consulted for evaluation.    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), Depression, DEPRESSION (09/23/2006), Diabetes mellitus without complication (HCC), Diverticulosis of colon, DIVERTICULOSIS, COLON (04/19/2009), ESOPHAGEAL VARICES (04/19/2009), ETOH abuse, GERD (09/23/2006), GERD (gastroesophageal reflux disease), Hepatitis C, HEPATITIS C (09/23/2006), Hyperlipidemia, HYPERLIPIDEMIA (09/23/2006), INSOMNIA-SLEEP DISORDER-UNSPEC (04/19/2009), Low back pain, LOW BACK PAIN (09/23/2006), Migraine, MRSA (methicillin resistant staph aureus) culture positive, Nephrolithiasis, NEPHROLITHIASIS, HX OF (11/02/2007), SEIZURE DISORDER (09/23/2006), Seizure disorder (HCC), and Sinusitis.   has a past surgical history that includes back surgury; knee surgury; cheekbone; Tonsillectomy; s/p sinus surgury (2010); I&D extremity (Right, 12/10/2013); and IR US Guide Bx Asp/Drain (03/26/2017).  Prior to Admission medications   Medication Sig Start Date End Date Taking? Authorizing Provider  aspirin (ASPIRIN EC) 81 MG EC tablet Take 81 mg by mouth daily.     Yes [provider]  gabapentin (NEURONTIN) 300 MG capsule TAKE 2 CAPSULES BY MOUTH TWICE DAILY 03/05/17  Yes Corwin LevinsJohn, James W, MD  HYDROcodone-acetaminophen [facility](NORCO) 10-325 MG per tablet Take 1 tablet by mouth 2 (two) times daily as needed for severe pain.    Yes [provider]  Insulin NPH Isophane & Regular (RELION 70/30 [place]) Inject into the skin. Sliding scale Dr. Lilli LightBalaen   Yes [provider]  omeprazole (PRILOSEC) 20 MG capsule Take 1 capsule (20 mg total) by mouth daily. 06/11/16  Yes [name], MD  propranolol (INDERAL) 20 MG tablet TAKE 1  TABLET BY MOUTH 2 TIMES DAILY 07/15/16  Yes [name], MD  spironolactone (ALDACTONE) 50 MG tablet Take 50 mg by mouth 1 day or 1 dose. 07/15/16  Yes [provider]  venlafaxine (EFFEXOR) 75 MG tablet TAKE 1 TABLET BY MOUTH 3 TIMES DAILY WITH MEALS 07/15/16  Yes [name], MD  B-D UF III MINI PEN NEEDLES 31G X 5 MM MISC USE AS DIRECTED ONCE DAILY    [name], MD  chlorhexidine (HIBICLENS) 4 % external liquid Apply topically as directed. Bathe 3x/week. Avoid face and genitalia Patient not taking: Reported on 03/22/2017 07/23/16   Nche, Bonna Gains, NP  desvenlafaxine (PRISTIQ) 50 MG 24 hr tablet Take 1 tablet (50 mg total) by mouth daily. Patient not taking: Reported on 03/09/2017 05/22/16   [name], MD  fluticasone [facility]) 50 MCG/ACT nasal spray PLACE 2 SPRAYS INTO BOTH NOSTRILS DAILY. Patient not taking: Reported on 03/30/2017 02/13/15   [name], MD  glucose blood (ONE TOUCH ULTRA TEST) test strip USE AS DIRECTED THREE TIMES DAILY 04/27/15   [name], MD  Lancets Misc. (ONE [facility] SURESOFT) MISC Use as directed once per day 11/10/12   [name], MD  metFORMIN (GLUCOPHAGE-XR) 500 MG 24 hr tablet Take 2 tablets (1,000 mg total) by mouth daily with breakfast. Patient not taking: Reported on 03/27/2017 05/24/16   [name], MD  RELION INSULIN SYRINGE 1ML/31G 31G X 5/16" 1 ML MISC  05/31/16   [provider]  triamcinolone (NASACORT AQ) 55 MCG/ACT AERO nasal inhaler Place 2 sprays into the nose daily. Patient not taking: Reported on 03/29/2017 05/22/16   [name], MD    Allergies  Allergen Reactions  . Duloxetine     REACTION: memory dysfunction, dizziness  . Latex Other (See Comments)    Reaction unknown  . Levofloxacin Nausea Only  . Mercury Other (See Comments)    Reaction unknown  . Sulfonamide Derivatives Other (See Comments)    Reaction unknown    FAMILY HISTORY:  family history includes Asthma in his daughter; Dementia in his  father; Depression in his mother; Hyperlipidemia in his mother; Hypertension in his mother; Stroke in his mother.  SOCIAL HISTORY:  reports that he has quit smoking. he has never used smokeless tobacco. He reports that he does not drink alcohol or use drugs.  REVIEW OF SYSTEMS:  POSITIVES IN BOLD Constitutional: Negative for fever, chills, weight loss, malaise/fatigue and diaphoresis.  HENT: Negative for hearing loss, ear pain, nosebleeds, congestion, sore throat, neck pain, tinnitus and ear discharge.   Eyes: Negative for blurred vision, double vision, photophobia, pain, discharge and redness.  Respiratory: Negative for cough, hemoptysis, sputum production, shortness of breath, wheezing and stridor.   Cardiovascular: Negative for chest pain, palpitations, orthopnea, claudication, leg swelling and PND.  Gastrointestinal: Negative for heartburn, nausea, vomiting, abdominal pain, diarrhea, constipation, blood in stool and melena.  Genitourinary: Negative for dysuria, urgency, frequency, hematuria and flank pain.  Musculoskeletal: Negative for myalgias, back pain, joint pain and falls.  Skin: Negative for itching and rash.  Neurological: Negative for dizziness, tingling, tremors, sensory change, speech change, focal weakness, seizures, loss of consciousness, weakness and headaches.  Endo/Heme/Allergies: Negative for environmental allergies and polydipsia. Does not bruise/bleed easily.  SUBJECTIVE:   VITAL SIGNS: Temp:  [  97.7 F (36.5 C)-98.5 F (36.9 C)] 98.5 F (36.9 C) (01/25 0434) Pulse Rate:  [77-81] 81 (01/25 0434) Resp:  [18-22] 20 (01/25 0434) BP: (124-137)/(70-81) 132/70 (01/25 0434) SpO2:  [94 %-98 %] 95 % (01/25 0434) Weight:  [233 lb 4 oz (105.8 kg)] 233 lb 4 oz (105.8 kg) (01/25 0434)  PHYSICAL EXAMINATION: General:  Elderly, no distress HEENT: MM pink/moist PSY: flat affect Neuro: awake, follows commands, oriented to place & name CV: s1s2 rrr, no m/r/g PULM:  even/non-labored, lungs bilaterally decreased UE:AVWU, non-tender, bsx4 active  Extremities: warm/dry, left ankle swelling, non tender Skin: no rashes or lesions  Recent Labs  Lab 03/26/17 0617 03/27/17 0631 03/23/2017 0600  NA 134* 134* 136  K 4.3 4.8 5.0  CL 107 109 108  CO2 22 20* 21*  BUN 42* 48* 52*  CREATININE 1.32* 1.30* 1.18  GLUCOSE 171* 174* 150*    Recent Labs  Lab 03/27/17 0631 03/27/17 1332 03/13/2017 0600  HGB 12.5* 12.7* 12.5*  HCT 36.7* 37.2* 37.2*  WBC 21.6* 22.5* 29.1*  PLT 159 170 160    Ct Chest Wo Contrast  Result Date: 03/27/2017 CLINICAL DATA:  Hemoptysis, abdominal pain, confusion, cirrhosis secondary to hepatitis C, diabetes mellitus, leukocytosis, UTI, MRSA bacteremia EXAM: CT CHEST WITHOUT CONTRAST TECHNIQUE: Multidetector CT imaging of the chest was performed following the standard protocol without IV contrast. Sagittal and coronal MPR images reconstructed from axial data set. 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 Scattered atherosclerotic calcifications aorta and coronary arteries. No pericardial effusion. Mediastinum/Nodes: Esophagus unremarkable. Scattered normal size mediastinal lymph nodes without thoracic adenopathy. Base of cervical region unremarkable. Lungs/Pleura: Dependent atelectasis RIGHT lower lobe. Area of opacity at the lateral aspect of the RIGHT upper lobe anteriorly, 4.8 x 4.5 x 3.0 cm. Minimal surrounding infiltrate. This process extends into the anterolateral RIGHT chest wall with destruction of the RIGHT third rib company by pathologic fracture. Additionally, the RIGHT upper lobe process is contiguous through the chest wall with a subpectoral gas and fluid collection external to the ribs in the anterolateral upper RIGHT chest, 10.3 x 3.2 cm in greatest axial dimensions and extending for 5 cm length. Bronchiectasis and atelectasis at  anterior base of RIGHT middle lobe. Remaining lungs clear. Upper Abdomen: Cirrhotic liver with multiple perigastric and perisplenic varices in the upper abdomen with a spontaneous splenorenal shunt better demonstrated on CT abdomen. Moderate ascites. Large area of developing low attenuation within the central spleen question splenic infarct versus developing splenic abscess new since 03/11/2017. Distended gallbladder. Musculoskeletal: No additional osseous findings. IMPRESSION: Consolidation at the anterolateral RIGHT upper lobe measuring 4.8 x 4.5 x 3.0 cm in size, contiguous with infiltrative process through the anterolateral RIGHT chest wall extending into the extra osseous soft tissues deep to the RIGHT pectoralis muscle where a gas and fluid collection is seen measuring 10.3 x 3.2 x 5.0 cm. Findings may either represent RIGHT upper lobe pneumonia with extension of infection through the chest wall associated with osteomyelitis of the anterolateral RIGHT third rib and accompanying chest wall abscess or RIGHT upper lobe tumor extending involving the RIGHT upper lobe and chest wall. The lack of RIGHT upper lobe opacity or RIGHT third rib destruction on chest radiograph at presentation on 03/10/2017 makes infection (pneumonia with chest wall extension and subpectoral abscess) a more likely etiology than tumor. Cirrhotic liver with numerous varices in the upper abdomen and a new area of low  attenuation within the central spleen which could represent an infarct or developing infection/abscess. Electronically Signed   By: Ulyses Southward M.D.   On: 03/27/2017 16:09   Ir US Guide Bx Asp/drain  Result Date: 03/26/2017 INDICATION: 67 year old with left ankle swelling. Left ankle effusion identified on MRI. EXAM: ULTRASOUND-GUIDED ASPIRATION OF LEFT ANKLE JOINT EFFUSION MEDICATIONS: None ANESTHESIA/SEDATION: None COMPLICATIONS: None immediate. PROCEDURE: Patient is confused and unable to give consent. Consent was obtained  for the patient's wife via the telephone. The left ankle was evaluated with ultrasound. Ankle effusion was identified along the anterolateral aspect of the ankle. The skin was prepped with chlorhexidine. A sterile field was created. Skin was anesthetized with 1% lidocaine. Using ultrasound guidance, 18 gauge spinal needle was directed into the joint effusion. Initially 3 mL thick yellow fluid was aspirated. 2 mL of additional bloody fluid was removed. Fluid was sent for analysis. Bandage placed over the puncture site. FINDINGS: Effusion along the anterolateral aspect of the ankle joint. 5 mL of fluid was removed. IMPRESSION: Successful ultrasound-guided aspiration of the left ankle joint effusion. Electronically Signed   By: Richarda Overlie M.D.   On: 03/26/2017 14:51   Dg Chest Port 1 View  Result Date: 03/27/2017 CLINICAL DATA:  Subcutaneous emphysema.  Hemoptysis. EXAM: PORTABLE CHEST 1 VIEW COMPARISON:  03/09/2017 FINDINGS: The cardiomediastinal silhouette is unchanged. Heart size is within normal limits. Thoracic aorta is tortuous. Chronic bronchitic changes are again noted. There is new hazy airspace opacity in the lateral right mid to upper lung. The left lung remains clear. No sizable pleural effusion or pneumothorax is identified. There is persistent mild elevation of the right hemidiaphragm. IMPRESSION: New right mid upper lung airspace opacity which may reflect pneumonia or hemorrhage. Electronically Signed   By: Sebastian Ache M.D.   On: 03/27/2017 12:15      SIGNIFICANT EVENTS  1/18  Admit   STUDIES:  CT of the abd/pelvis 1/18 >> morphologic changes of advanced cirrhosis with evidence of portal hypertension, numerous non-obstructing renal calculi but no evidence of obstruction or UTI, colonic diverticulosis without acute diverticulitis.   CT Chest w/o 1/24 >> consolidation at the anterolateral right upper lobe measuring 4.8 x 4.5 x 3 cm, contiguous with infiltrative process through the  anterolateral right chest wall extending into the extra osseous soft tissues deep to the right pectoralis muscle where a gas and fluid collection is seen measuring 10.3 x 3.2 x 5  CULTURES BCID 1/18 >> MRSA detected BCx2 1/18 >> MRSA  UC 1/18 >> MRSA (100k) BCx2 1/20 >> MRSA  BCx2 1/23 >>  Ankle Fluid 1/23 >>   ANTIBIOTICS Ceftriaxone 1/18 >> 1/21  Vanco 1/19 >>    ASSESSMENT / PLAN:  Discussion: 67 y/o M with advanced cirrhosis admitted 1/19 with reports of ankle pain, abdominal pain and confusion.  Cultures revealed MRSA bacteremia, urine also positive but likely due to bacteremia / not urine source.  Left ankle was aspirated which also grew MRSA.  He developed hemoptysis on 1/24 and CT demonstrated right pectoral abscess with concern for communication into the rib / muscle, also notable for RUL mass like infiltrate concern for septic emboli.     Hemoptysis RUL Infiltrate RUL Mass-like Lesion - infiltrates into the rib / muscle  Plan: Monitor hemoptysis - should be self limited IV abx as above  If persistent leucocytosis/ clinical deterioration, may need to drain right pectoral abscess   Doubt malignant process given acute presentation / MRSA bacteremia.  Not seen on  CXR's.   MRSA Bacteremia - suspect secondary to septic left ankle  Plan: Continue IV abx per primary / ID Follow for clearance / 1/23 blood cultures pending  Discussed with ID & primary team  PCCM available as needed  Cyril Mourning MD. [facility]. [place] [facility] Pager [phone] If no response call [phone]   04/01/2017

## 2017-03-28 NOTE — Progress Notes (Signed)
Pharmacy Antibiotic Note  Kurt BundeRichard S Guier is a 67 y.o. male with PMH HepC with cirrhosis, DM, bipolar, admitted on 27-Nov-2017 for abdominal pain, confusion; found to have complicated MRSA bacteremia. Pharmacy has been consulted for Vancomycin dosing.  Today, 04/02/2017  Day #7 vancomycin  1st repeat BCx still with MRSA; 2nd repeat BCx clear x 2d  SCr continues to improve, now just above baseline (~1)  WBC previously improved, now trending back up w/o steroids  Remains afebrile  Refused TEE, however Cardiologist feels too high risk for TEE given varices and recent bleeding; recommends full endocarditis Tx  L ankle effusion noted and aspirated d/t concern for septic arthritis  New consolidation seen on 1/24 imaging; more likely RUL pneumonia with extension of infection into rib and chest wall vs tumor   Plan:  With improved renal function, will empirically increase vancomycin to 1750 mg IV q24 hr. Can recheck vancomycin kinetics when SCr back to baseline as extended duration will be needed  Monitor renal function    Height: 6\' 1"  (185.4 cm) Weight: 233 lb 4 oz (105.8 kg) IBW/kg (Calculated) : 79.9  Temp (24hrs), Avg:98.1 F (36.7 C), Min:97.7 F (36.5 C), Max:98.5 F (36.9 C)  Recent Labs  Lab 2017-05-04 1313 2017-05-04 1503  03/24/17 0553 03/24/17 1304 03/30/2017 0730 03/26/17 0617 03/27/17 0631 03/27/17 1332 03/13/2017 0600  WBC  --   --    < > 24.2*  --  16.8* 17.6* 21.6* 22.5* 29.1*  CREATININE  --   --    < > 1.50*  --  1.32* 1.32* 1.30*  --  1.18  LATICACIDVEN 2.90* 2.74*  --   --   --   --   --   --   --   --   VANCOTROUGH  --   --   --  16  --  18  --   --   --   --   VANCOPEAK  --   --   --   --  36  --   --   --   --   --    < > = values in this interval not displayed.    Estimated Creatinine Clearance: 77.6 mL/min (by C-G formula based on SCr of 1.18 mg/dL).    Allergies  Allergen Reactions  . Duloxetine     REACTION: memory dysfunction, dizziness  .  Latex Other (See Comments)    Reaction unknown  . Levofloxacin Nausea Only  . Mercury Other (See Comments)    Reaction unknown  . Sulfonamide Derivatives Other (See Comments)    Reaction unknown    Antimicrobials this admission:  Vancomycin 03/22/2017 >> Ceftriaxone 27-Nov-2017 >> 1/21  Dose adjustments/levels this admission:  1/20 VT = 16 on 1750 q24 1/21 VPk (1304 - 2.5 hrs after end of infusion) = 36 mcg/mL 1/22 VT 0730 = 18 mcg/mL 1/22 change vanco to 1500mg  q24h  Microbiology results:  1/18 UCx: > 100k MRSA 1/18 BCx: 4/4 bottles w/ MRSA 1/20 repeat BCx: 2/2 peds bottles MRSA 1/23 repeat BCx (4 bottles): ngtd 1/23 synovial fluid from ankle: rare S aureus  Thank you for allowing pharmacy to be a part of this patient's care.  Bernadene Personrew Ilisa Hayworth, PharmD, BCPS [phone] 04/02/2017, 9:01 AM

## 2017-03-28 NOTE — [place] FL2 (Signed)
[place] MEDICAID FL2 LEVEL OF CARE SCREENING TOOL     IDENTIFICATION  Patient Name: Kurt Reid Birthdate: 1951/03/02 Sex: male Admission Date (Current Location): Apr 05, 2017  [facility] and [place] Number:  Producer, television/film/video and Address:  [facility],  [address] [place], [place] [address]      Provider Number: [id number]  Attending Physician Name and Address:  Rodolph Bong, MD  Relative Name and Phone Number:       Current Level of Care: Hospital Recommended Level of Care: Skilled Nursing Facility Prior Approval Number:    Date Approved/Denied:   PASRR Number:    Discharge Plan: SNF    Current Diagnoses: Patient Active Problem List   Diagnosis Date Noted  . Hematemesis 03/27/2017  . Ankle pain, left   . Left ankle effusion   . Delirium due to another medical condition 03/19/2017  . Leukocytosis 04-05-2017  . Sepsis (HCC) Apr 05, 2017  . Altered mental status 05-Apr-2017  . UTI (urinary tract infection) 04-05-2017  . Abnormal MRI of abdomen 11/20/2016  . Skin lesion 05/22/2016  . Liver nodule 11/17/2015  . Gynecomastia 06/02/2014  . Hypotension 01/18/2014  . Dog bite of multiple sites of right hand and fingers 12/10/2013  . DM type 2 without retinopathy (HCC) 12/10/2013  . Chronic hepatitis C (HCC) 12/10/2013  . Dyslipidemia 12/10/2013  . Hemoptysis 06/05/2013  . Acquired ptosis of right eyelid 08/26/2012  . Left lumbar radiculopathy 08/09/2011  . Abdominal pain, RUQ 08/09/2011  . Abscess of abdominal wall 07/24/2011  . 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 Acute sinus infection 04/06/2011  . ADHD (attention deficit hyperactivity disorder) 04/06/2011  . Hepatic cirrhosis due to chronic hepatitis C infection (HCC) 11/23/2010  . Encounter for well adult exam with abnormal findings 07/23/2010  . Anxiety state 04/19/2009  . Esophageal varices (HCC) 04/19/2009  . DIVERTICULOSIS, COLON 04/19/2009  .  INSOMNIA-SLEEP DISORDER-UNSPEC 04/19/2009  . Chronic pain syndrome 01/18/2009  . BENIGN PROSTATIC HYPERTROPHY 11/02/2007  . FATIGUE 11/02/2007  . NEPHROLITHIASIS, HX OF 11/02/2007  . BIPOLAR AFFECTIVE DISORDER 09/23/2006  . Depression 09/23/2006  . Allergic rhinitis 09/23/2006  . GERD 09/23/2006  . LOW BACK PAIN 09/23/2006  . Convulsions (HCC) 09/23/2006    Orientation RESPIRATION BLADDER Height & Weight     Self  Normal Incontinent Weight: 233 lb 4 oz (105.8 kg) Height:  6\' 1"  (185.4 cm)  BEHAVIORAL SYMPTOMS/MOOD NEUROLOGICAL BOWEL NUTRITION STATUS      Incontinent Diet(see dc summary)  AMBULATORY STATUS COMMUNICATION OF NEEDS Skin   Extensive Assist Verbally Normal                       Personal Care Assistance Level of Assistance  Bathing, Feeding, Dressing Bathing Assistance: Limited assistance Feeding assistance: Independent Dressing Assistance: Limited assistance     Functional Limitations Info  Sight, Hearing, Speech Sight Info: Adequate Hearing Info: Adequate Speech Info: Impaired(slurred)    SPECIAL CARE FACTORS FREQUENCY  PT (By licensed PT), OT (By licensed OT)     PT Frequency: 5x/week OT Frequency: 5x/week            Contractures      Additional Factors Info  Code Status, Allergies, Isolation Precautions Code Status Info: Full Allergies Info: Duloxetine, Latex, Levofloxacin, Mercury, Sulfonamide Derivatives     Isolation Precautions Info: MRSA     Current Medications (03/06/2017):  This is the current hospital active medication list Current Facility-Administered Medications  Medication Dose Route  Frequency Provider Last Rate Last Dose  . [MAR Hold] 0.9 %  sodium chloride infusion  250 mL Intravenous PRN Shon Hale, MD      . Mitzi Hansen Hold] acetaminophen (TYLENOL) tablet 650 mg  650 mg Oral Q6H PRN Mariea Clonts, Courage, MD       Or  . Mitzi Hansen Hold] acetaminophen (TYLENOL) suppository 650 mg  650 mg Rectal Q6H PRN Emokpae, Courage, MD      .  Mitzi Hansen Hold] albuterol (PROVENTIL) (2.5 MG/3ML) 0.083% nebulizer solution 2.5 mg  2.5 mg Nebulization Q2H PRN Emokpae, Courage, MD      . Mitzi Hansen Hold] aspirin EC tablet 81 mg  81 mg Oral Daily Emokpae, Courage, MD   81 mg at 03/18/2017 0847  . [MAR Hold] Chlorhexidine Gluconate Cloth 2 % PADS 6 each  6 each Topical Q0600 Glade Lloyd, MD   6 each at 04/02/2017 0451  . [MAR Hold] gabapentin (NEURONTIN) capsule 600 mg  600 mg Oral BID Mariea Clonts, Courage, MD   600 mg at 03/18/2017 0848  . [MAR Hold] gadobenate dimeglumine (MULTIHANCE) injection 10 mL  10 mL Intravenous Once PRN Cammy Copa, MD      . Mitzi Hansen Hold] guaiFENesin-dextromethorphan (ROBITUSSIN DM) 100-10 MG/5ML syrup 5 mL  5 mL Oral Q4H PRN Shon Hale, MD   5 mL at 03/27/17 2232  . [MAR Hold] insulin aspart (novoLOG) injection 0-15 Units  0-15 Units Subcutaneous TID WC Glade Lloyd, MD   2 Units at 03/14/2017 0804  . [MAR Hold] insulin aspart (novoLOG) injection 0-5 Units  0-5 Units Subcutaneous QHS Glade Lloyd, MD   2 Units at 03/04/2017 2050  . [MAR Hold] insulin detemir (LEVEMIR) injection 15 Units  15 Units Subcutaneous QHS Glade Lloyd, MD   15 Units at 03/27/17 2232  . lactated ringers infusion   Intravenous Continuous Hatchett, Susann Givens, MD      . Mitzi Hansen Hold] lactulose (CHRONULAC) 10 GM/15ML solution 30 g  30 g Oral TID Rodolph Bong, MD   30 g at 03/08/2017 0849  . [MAR Hold] metoprolol tartrate (LOPRESSOR) injection 5 mg  5 mg Intravenous Once PRN Blount, Janalyn Rouse, NP      . Mitzi Hansen Hold] multivitamin with minerals tablet 1 tablet  1 tablet Oral Daily Shon Hale, MD   1 tablet at 03/12/2017 0849  . [MAR Hold] mupirocin ointment (BACTROBAN) 2 % 1 application  1 application Nasal BID Glade Lloyd, MD   1 application at 04/02/2017 0849  . [MAR Hold] ondansetron (ZOFRAN) tablet 4 mg  4 mg Oral Q6H PRN Emokpae, Courage, MD       Or  . Mitzi Hansen Hold] ondansetron (ZOFRAN) injection 4 mg  4 mg Intravenous Q6H PRN Emokpae, Courage, MD   4  mg at 03/27/17 1330  . [MAR Hold] oxyCODONE (Oxy IR/ROXICODONE) immediate release tablet 10 mg  10 mg Oral Q4H PRN Rodolph Bong, MD   10 mg at 03/17/2017 0848  . [MAR Hold] pantoprazole (PROTONIX) injection 40 mg  40 mg Intravenous Q12H Rodolph Bong, MD   40 mg at 03/27/2017 0848  . [MAR Hold] propranolol (INDERAL) tablet 20 mg  20 mg Oral BID Shon Hale, MD   20 mg at 03/08/2017 0848  . [MAR Hold] rifaximin (XIFAXAN) tablet 550 mg  550 mg Oral BID Rodolph Bong, MD   550 mg at 03/16/2017 0847  . [MAR Hold] sodium chloride flush (NS) 0.9 % injection 3 mL  3 mL Intravenous Q12H Shon Hale, MD  3 mL at 03/24/2017 0850  . [MAR Hold] sodium chloride flush (NS) 0.9 % injection 3 mL  3 mL Intravenous PRN Emokpae, Courage, MD      . Mitzi Hansen[MAR Hold] vancomycin (VANCOCIN) 1,750 mg in sodium chloride 0.9 % 500 mL IVPB  1,750 mg Intravenous Q24H Wofford, Deirdre EvenerDrew A, RPH      . [MAR Hold] venlafaxine (EFFEXOR) tablet 150 mg  150 mg Oral Q breakfast Hanley BenAlekh, Kshitiz, MD   150 mg at 04/02/2017 0850  . [MAR Hold] venlafaxine (EFFEXOR) tablet 75 mg  75 mg Oral QHS Glade LloydAlekh, Kshitiz, MD   75 mg at 03/27/17 2137     Discharge Medications: Please see discharge summary for a list of discharge medications.  Relevant Imaging Results:  Relevant Lab Results:   Additional Information ssn: [id number]  Coralyn HellingBernette Broder, LCSW

## 2017-03-28 NOTE — Progress Notes (Signed)
PT Cancellation Note  Patient Details Name: Kurt Reid MRN: [id number] DOB: 02-Jan-1951   Cancelled Treatment:    Reason Eval/Treat Not Completed: Medical issues which prohibited therapy. Spoke with RN who requested PT be held today. Pt not doing well and tentatively scheduled for a procedure later today. Will check back another day.    Kurt AlertJannie Lorann Reid, MPT Pager: [phone]

## 2017-03-29 ENCOUNTER — Encounter (HOSPITAL_COMMUNITY): Payer: Self-pay | Admitting: Orthopedic Surgery

## 2017-03-29 DIAGNOSIS — M00072 Staphylococcal arthritis, left ankle and foot: Secondary | ICD-10-CM | POA: Diagnosis present

## 2017-03-29 DIAGNOSIS — I8510 Secondary esophageal varices without bleeding: Secondary | ICD-10-CM

## 2017-03-29 DIAGNOSIS — J851 Abscess of lung with pneumonia: Secondary | ICD-10-CM

## 2017-03-29 DIAGNOSIS — A4102 Sepsis due to Methicillin resistant Staphylococcus aureus: Secondary | ICD-10-CM | POA: Diagnosis present

## 2017-03-29 DIAGNOSIS — F319 Bipolar disorder, unspecified: Secondary | ICD-10-CM

## 2017-03-29 DIAGNOSIS — R338 Other retention of urine: Secondary | ICD-10-CM | POA: Clinically undetermined

## 2017-03-29 DIAGNOSIS — G894 Chronic pain syndrome: Secondary | ICD-10-CM

## 2017-03-29 DIAGNOSIS — F05 Delirium due to known physiological condition: Secondary | ICD-10-CM

## 2017-03-29 DIAGNOSIS — R222 Localized swelling, mass and lump, trunk: Secondary | ICD-10-CM

## 2017-03-29 DIAGNOSIS — B182 Chronic viral hepatitis C: Secondary | ICD-10-CM

## 2017-03-29 DIAGNOSIS — K7460 Unspecified cirrhosis of liver: Secondary | ICD-10-CM

## 2017-03-29 LAB — BODY FLUID CULTURE: GRAM STAIN: NONE SEEN

## 2017-03-29 LAB — BASIC METABOLIC PANEL
Anion gap: 7 (ref 5–15)
BUN: 62 mg/dL — AB (ref 6–20)
CO2: 17 mmol/L — ABNORMAL LOW (ref 22–32)
CREATININE: 1.71 mg/dL — AB (ref 0.61–1.24)
Calcium: 7.6 mg/dL — ABNORMAL LOW (ref 8.9–10.3)
Chloride: 107 mmol/L (ref 101–111)
GFR, EST AFRICAN AMERICAN: 46 mL/min — AB (ref >60)
GFR, EST NON AFRICAN AMERICAN: 40 mL/min — AB (ref >60)
Glucose, Bld: 160 mg/dL — ABNORMAL HIGH (ref 65–99)
POTASSIUM: 5.2 mmol/L — AB (ref 3.5–5.1)
SODIUM: 131 mmol/L — AB (ref 135–145)

## 2017-03-29 LAB — GLUCOSE, CAPILLARY
GLUCOSE-CAPILLARY: 163 mg/dL — AB (ref 65–99)
Glucose-Capillary: 162 mg/dL — ABNORMAL HIGH (ref 65–99)
Glucose-Capillary: 144 mg/dL — ABNORMAL HIGH (ref 65–99)
Glucose-Capillary: 132 mg/dL — ABNORMAL HIGH (ref 65–99)

## 2017-03-29 LAB — CBC WITH DIFFERENTIAL/PLATELET
BASOS ABS: 0 10*3/uL (ref 0.0–0.1)
BASOS PCT: 0 %
EOS ABS: 0 10*3/uL (ref 0.0–0.7)
Eosinophils Relative: 0 %
HCT: 37.5 % — ABNORMAL LOW (ref 39.0–52.0)
Hemoglobin: 12.7 g/dL — ABNORMAL LOW (ref 13.0–17.0)
LYMPHS PCT: 8 %
Lymphs Abs: 2.5 10*3/uL (ref 0.7–4.0)
MCH: 30.8 pg (ref 26.0–34.0)
MCHC: 33.9 g/dL (ref 30.0–36.0)
MCV: 90.8 fL (ref 78.0–100.0)
MONO ABS: 1.6 10*3/uL — AB (ref 0.1–1.0)
Monocytes Relative: 5 %
NEUTROS PCT: 87 %
Neutro Abs: 27.1 10*3/uL — ABNORMAL HIGH (ref 1.7–7.7)
PLATELETS: 217 10*3/uL (ref 150–400)
RBC: 4.13 MIL/uL — ABNORMAL LOW (ref 4.22–5.81)
RDW: 16.9 % — ABNORMAL HIGH (ref 11.5–15.5)
WBC: 31.2 10*3/uL — ABNORMAL HIGH (ref 4.0–10.5)

## 2017-03-29 LAB — VANCOMYCIN, TROUGH: VANCOMYCIN TR: 45 ug/mL — AB (ref 15–20)

## 2017-03-29 MED ORDER — PHENAZOPYRIDINE HCL 200 MG PO TABS
200.0000 mg | ORAL_TABLET | Freq: Three times a day (TID) | ORAL | Status: DC
  Administered 2017-03-29: 200 mg via ORAL
  Filled 2017-03-29 (×4): qty 1

## 2017-03-29 MED ORDER — VANCOMYCIN HCL 10 G IV SOLR
1250.0000 mg | INTRAVENOUS | Status: DC
  Filled 2017-03-29: qty 1250

## 2017-03-29 MED ORDER — SODIUM CHLORIDE 0.9 % IV SOLN
INTRAVENOUS | Status: DC
  Administered 2017-03-29 – 2017-03-30 (×2): via INTRAVENOUS

## 2017-03-29 MED ORDER — SODIUM CHLORIDE 0.9 % IV SOLN
INTRAVENOUS | Status: DC
  Administered 2017-03-29: 11:00:00 via INTRAVENOUS

## 2017-03-29 MED ORDER — LORAZEPAM 2 MG/ML IJ SOLN
0.5000 mg | Freq: Once | INTRAMUSCULAR | Status: AC
  Administered 2017-03-29: 0.5 mg via INTRAVENOUS
  Filled 2017-03-29: qty 1

## 2017-03-29 MED ORDER — OXYCODONE HCL 5 MG PO TABS
10.0000 mg | ORAL_TABLET | ORAL | Status: DC | PRN
  Administered 2017-03-30: 10 mg via ORAL
  Filled 2017-03-29 (×2): qty 2

## 2017-03-29 MED ORDER — BELLADONNA ALKALOIDS-OPIUM 16.2-60 MG RE SUPP
1.0000 | Freq: Four times a day (QID) | RECTAL | Status: DC | PRN
  Administered 2017-03-29: 1 via RECTAL
  Filled 2017-03-29: qty 1

## 2017-03-29 MED ORDER — TAMSULOSIN HCL 0.4 MG PO CAPS
0.4000 mg | ORAL_CAPSULE | Freq: Every day | ORAL | Status: DC
  Administered 2017-03-29 – 2017-03-30 (×2): 0.4 mg via ORAL
  Filled 2017-03-29 (×2): qty 1

## 2017-03-29 MED ORDER — LORAZEPAM 2 MG/ML IJ SOLN
0.5000 mg | Freq: Four times a day (QID) | INTRAMUSCULAR | Status: DC | PRN
  Administered 2017-03-30 – 2017-04-02 (×5): 0.5 mg via INTRAVENOUS
  Filled 2017-03-29 (×5): qty 1

## 2017-03-29 NOTE — Progress Notes (Addendum)
PROGRESS NOTE    Kurt Reid  XBM:[id number] DOB: [birth year] DOA: 03/30/2017 PCP: [name], MD    Brief Narrative:  67 year old male with history of liver cirrhosis secondary to hepatitis C, diabetes, bipolar disorder sent by PCP for evaluation of abdominal pain and confusion.  Patient was admitted with leukocytosis probably from UTI and started on antibiotics.  He was found to have MRSA bacteremia.  ID was consulted.     Assessment & Plan:   Principal Problem:   Delirium due to another medical condition Active Problems:   BIPOLAR AFFECTIVE DISORDER   Chronic pain syndrome   Esophageal varices (HCC)   Hepatic cirrhosis due to chronic hepatitis C infection (HCC)   Hemoptysis   DM type 2 without retinopathy (HCC)   Chronic hepatitis C (HCC)   Leukocytosis   Sepsis (HCC)   Altered mental status   UTI (urinary tract infection)   Ankle pain, left   Left ankle effusion   Hematemesis   Acute urinary retention  MRSA bacteremia -Questionable cause.  May have seeded from ankle. Repeat blood cultures from 03/23/2017 positive.  Blood cultures from 03/26/2017 still negative. ID following.  2D echo negative for vegetation.  Patient was to have a TEE on 03/24/2017 however patient refused.  Patient has been seen by psychiatry and lacks capacity at this time.  RN spoke with family who will be fine signing consent for TEE.  Cardiology was consulted to see if TEE could be rearranged.  Spoke with the card master who I discussed with cardiology who feel patient is too high risk with history of esophageal varices for TEE to be done and recommended empiric treatment for endocarditis.  She will likely need 6-8 weeks of IV antibiotics or per ID recommendations.  Patient status post left ankle aspiration per interventional radiology with cultures positive for MRSA.  Patient status post irrigation and debridement per Dr. August Saucer 03/12/2017.  Continue current empiric IV vancomycin.  ID following.  Duration  of antibiotics per ID.  Probable acute metabolic encephalopathy from bacteremia/UTI/mild hepatic encephalopathy causing altered mental status -Mental status is improving but he is still confused per nursing.   -CT abdomen and pelvis showed only small ascites; chest x-ray on admission did not show infiltrates. -Increased lactulose to 3 times daily.  Continue Xifaxan.  Continue empiric IV antibiotics. Follow.  MRSAUTI -Urine cultures positive for MRSA.  Continue IV vancomycin  Leukocytosis -Probably secondary to  MRSA bacteremia/UTI.  Worsening leukocytosis.  Patient with right upper lobe mass with infiltrate into rib and muscle concerning for possible abscess which may be contributing to patient's worsening leukocytosis.  Currently afebrile.  2D echo negative for vegetations.  TEE discussed with cardiology who feel patient is too high risk for TEE and recommending empiric treatment for endocarditis.  Continue empiric IV vancomycin.  ID following.   Decompensated liver Cirrhosiswith portal hypertension and mild hepatic encephalopathy -Increased lactulose to 3 times daily.  Continue Xifaxan.  Continue propranolol. Outpatient follow-up with GI  -CT abdomen showed only small ascites so doubt that patient has SBP.  -Check LFTs in the morning.  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  No surgical intervention as per orthopedics. -fall precautions -PT evaluation -Per orthopedics.  Left ankle effusion/MRSA Septic arthritis of left ankle Concern for septic arthritis.  Patient seen by orthopedics.  Patient  status post aspiration of left ankle effusion per interventional radiology 03/26/2017.  Cultures consistent with MRSA.  Patient is status post left ankle irrigation and debridement per Dr. August Saucer  03/06/2017 and cultures sent which are pending.  Continue IV vancomycin.  ID and orthopedics following.   Hyponatremia -Probably from dehydration.  Improved.  Follow.  Thrombocytopenia -Likely secondary to cirrhosis.  Improved.   Diabetes mellitus Hemoglobin A1c 8.7.  CBGs have ranged from 144-162.  Continue current regimen of Levemir 15 units daily and sliding scale insulin.  Patient with poor oral intake and as such meal coverage insulin has been discontinued.    Hemoptysis Per RN patient noted to have some hemoptysis or questionable hematemesis the night of 03/26/2017.  Patient with no hemoptysis noted today.  No tongue lacerations or lip lacerations.  Patient with history of cirrhosis and esophageal varices and as such GI consultation was obtained.  Patient was seen in consultation by Dr. Russella Dar, gastroenterology who feels patient symptoms are more from hemoptysis as opposed to hematemesis.  GI recommended pulmonary evaluation.  Chest x-ray has been ordered this morning which shows a new right mid upper lung airspace opacity which may reflect pneumonia or hemorrhage.  Spoke with Dr. Vassie Loll of pulmonary who had recommended CT chest which was done which showed a right pictorial abscess and right upper lobe masslike opacity eroding into rib/muscle with concern for possible septic emboli.  Pulmonary recommended monitoring of hemoptysis and following H&H.  General surgery evaluated right upper lobe masslike lesion and recommended CVTS evaluation.    Right upper lobe masslike lesion--infiltrates into rib/muscle/right upper lobe infiltrate Noted on CT chest.  Patient with persistent leukocytosis and clinical deterioration.  Patient seen in consultation by pulmonary as well as general surgery.  General surgery assessed patient evaluated CT scan and recommended evaluation by cardiothoracic surgery for further management.  Cardiothoracic surgery consultation pending.   Increasing creatinine Check a Vanco  trough level.  Place on gentle hydration.??  Change IV vancomycin to daptomycin however will defer to ID.  Follow.  Acute urinary retention Place Foley catheter.  Flomax 0.4 mg daily.  Will need a voiding trial in about 5 days only to follow-up with urology in the outpatient setting.      DVT prophylaxis: SCDs Code Status: Full Family Communication: Updated wife at bedside.  Disposition Plan: To be determined.   Consultants:   Infectious disease: Dr. Drue Second 03/22/2017  Orthopedics Dr. August Saucer 03/23/2017  Psychiatry: Dr. Sharma Covert 02-Apr-2017  Pulmonary : Dr. Vassie Loll 03/18/2017  Gastroenterology: Dr. Russella Dar 03/27/2017  General surgery: Dr. Johna Sheriff 04/02/2017  Procedures:   CT abdomen and pelvis 03/10/2017  Plain films of the ankle 03/26/2017, 03/23/2017  Chest x-ray 03/31/2017, 03/27/2017  MRI left ankle 03/24/2017  MRI right hip 03/22/2017  Ultrasound-guided aspiration left ankle joint effusion Per Dr. Lowella Dandy interventional radiology 03/26/2017  Plain films of bilateral hips with pelvis 03/05/2017  2D echo 03/22/2017   CT chest 03/27/2017  Irrigation and debridement of left ankle per Dr. August Saucer 03/18/2017  Antimicrobials:   IV vancomycin 03/22/2017>>>>>  IV Rocephin 03/27/2017>>>>> 03/24/2017   Subjective: Patient drowsy however easily arousable.  Answering questions appropriately then drifts off to sleep. Patient denies any shortness of breath.  Patient denies any chest pain.   Objective: Vitals:   03/29/2017 1700 03/04/2017 1720 03/26/2017 2158 03/29/17 0514  BP: 122/69 137/69 132/63 (!) 155/93  Pulse: 87 91 92 95  Resp: 12 14 12 14   Temp: 98.4 F (36.9 C) 98.3 F (36.8 C) 98.3  F (36.8 C) 98.4 F (36.9 C)  TempSrc:   Oral Oral  SpO2: 95% 95% 97% 92%  Weight:      Height:        Intake/Output Summary (Last 24 hours) at 03/29/2017 1222 Last data filed at 03/29/2017 8119 Gross per 24 hour  Intake 1350 ml  Output 705 ml  Net 645 ml   Filed Weights   03/26/2017 0608 03/27/17  0548 03/30/2017 0434  Weight: 108.6 kg (239 lb 6.7 oz) 107.2 kg (236 lb 5.3 oz) 105.8 kg (233 lb 4 oz)    Examination:  General exam: Drowsy.  Jaundiced. Respiratory system: Clear to auscultation bilaterally anterior lung fields.  No wheezes, no crackles, no rhonchi.  Some crepitus right chest wall area on the lateral area, which is raised.  Respiratory effort normal. Cardiovascular system: Regular rate and rhythm no murmurs rubs or gallops.  No JVD.  trace+ bilateral lower extremity edema. Gastrointestinal system: Abdomen is soft, nontender, nondistended, positive bowel sounds.  No hepatosplenomegaly.  Central nervous system: Drowsy however easily arousable.  Answering questions.  Moving extremities spontaneously.   Extremities: Symmetric 5 x 5 power.  Left ankle wrapped. Skin: No rashes, lesions or ulcers Psychiatry: Unable to assess at this time.. Mood & affect appropriate.     Data Reviewed: I have personally reviewed following labs and imaging studies  CBC: Recent Labs  Lab 03/16/2017 0730 03/26/17 0617 03/27/17 0631 03/27/17 1332 04/01/2017 0600 03/29/17 0632  WBC 16.8* 17.6* 21.6* 22.5* 29.1* 31.2*  NEUTROABS 13.7* 13.7* 17.8*  --  25.9* 27.1*  HGB 12.4* 12.2* 12.5* 12.7* 12.5* 12.7*  HCT 36.3* 36.3* 36.7* 37.2* 37.2* 37.5*  MCV 89.9 90.3 89.5 90.1 90.7 90.8  PLT 137* 148* 159 170 160 217   Basic Metabolic Panel: Recent Labs  Lab 03/23/17 0550 03/24/17 0553 03/07/2017 0730 03/26/17 0617 03/27/17 0631 03/16/2017 0600 03/29/17 0632  NA 130* 131* 133* 134* 134* 136 131*  K 4.1 4.3 4.5 4.3 4.8 5.0 5.2*  CL 103 107 108 107 109 108 107  CO2 21* 18* 21* 22 20* 21* 17*  GLUCOSE 292* 265* 276* 171* 174* 150* 160*  BUN 44* 42* 44* 42* 48* 52* 62*  CREATININE 1.59* 1.50* 1.32* 1.32* 1.30* 1.18 1.71*  CALCIUM 7.6* 8.0* 8.1* 8.1* 8.0* 8.0* 7.6*  MG 2.1 2.1 2.2 2.2  --   --   --    GFR: Estimated Creatinine Clearance: 53.5 mL/min (A) (by C-G formula based on SCr of 1.71 mg/dL  (H)). Liver Function Tests: Recent Labs  Lab 03/24/17 0553 03/19/2017 0730 03/26/17 0617 03/27/17 0631 03/22/2017 0600  AST 92* 72* 189* 116* 123*  ALT 47 42 64* 54 55  ALKPHOS 274* 278* 336* 320* 296*  BILITOT 3.5* 4.1* 5.1* 8.5* 9.2*  PROT 6.2* 6.2* 6.5 6.5 6.5  ALBUMIN 1.9* 1.7* 1.7* 1.7* 1.5*   No results for input(s): LIPASE, AMYLASE in the last 168 hours. No results for input(s): AMMONIA in the last 168 hours. Coagulation Profile: No results for input(s): INR, PROTIME in the last 168 hours. Cardiac Enzymes: No results for input(s): CKTOTAL, CKMB, CKMBINDEX, TROPONINI in the last 168 hours. BNP (last 3 results) No results for input(s): PROBNP in the last 8760 hours. HbA1C: No results for input(s): HGBA1C in the last 72 hours. CBG: Recent Labs  Lab 03/06/2017 1208 03/19/2017 1640 03/23/2017 2154 03/29/17 0750 03/29/17 1134  GLUCAP 155* 142* 145* 162* 144*   Lipid Profile: No results for input(s): CHOL, HDL, LDLCALC, TRIG,  CHOLHDL, LDLDIRECT in the last 72 hours. Thyroid Function Tests: No results for input(s): TSH, T4TOTAL, FREET4, T3FREE, THYROIDAB in the last 72 hours. Anemia Panel: No results for input(s): VITAMINB12, FOLATE, FERRITIN, TIBC, IRON, RETICCTPCT in the last 72 hours. Sepsis Labs: No results for input(s): PROCALCITON, LATICACIDVEN in the last 168 hours.  Recent Results (from the past 240 hour(s))  Culture, blood (routine x 2)     Status: Abnormal   Collection Time: 2017/04/07 11:31 AM  Result Value Ref Range Status   Specimen Description BLOOD BLOOD RIGHT FOREARM  Final   Special Requests   Final    BOTTLES DRAWN AEROBIC AND ANAEROBIC Blood Culture adequate volume   Culture  Setup Time   Final    GRAM POSITIVE COCCI IN BOTH AEROBIC AND ANAEROBIC BOTTLES CRITICAL RESULT CALLED TO, READ BACK BY AND VERIFIED WITHPeggyann Juba [facility] 1610 03/22/17 A BROWNING Performed at [facility], [address], [place], [place] [address]    Culture METHICILLIN  RESISTANT STAPHYLOCOCCUS AUREUS (A)  Final   Report Status 03/24/2017 FINAL  Final   Organism ID, Bacteria METHICILLIN RESISTANT STAPHYLOCOCCUS AUREUS  Final      Susceptibility   Methicillin resistant staphylococcus aureus - MIC*    CIPROFLOXACIN <=0.5 SENSITIVE Sensitive     ERYTHROMYCIN >=8 RESISTANT Resistant     GENTAMICIN <=0.5 SENSITIVE Sensitive     OXACILLIN RESISTANT Resistant     TETRACYCLINE <=1 SENSITIVE Sensitive     VANCOMYCIN <=0.5 SENSITIVE Sensitive     TRIMETH/SULFA <=10 SENSITIVE Sensitive     CLINDAMYCIN <=0.25 SENSITIVE Sensitive     RIFAMPIN <=0.5 SENSITIVE Sensitive     Inducible Clindamycin NEGATIVE Sensitive     * METHICILLIN RESISTANT STAPHYLOCOCCUS AUREUS  Blood Culture ID Panel (Reflexed)     Status: Abnormal   Collection Time: 04-07-17 11:31 AM  Result Value Ref Range Status   Enterococcus species NOT DETECTED NOT DETECTED Final   Listeria monocytogenes NOT DETECTED NOT DETECTED Final   Staphylococcus species DETECTED (A) NOT DETECTED Final    Comment: CRITICAL RESULT CALLED TO, READ BACK BY AND VERIFIED WITHPeggyann Juba PHARMD 4098 03/22/17 A BROWNING    Staphylococcus aureus DETECTED (A) NOT DETECTED Final    Comment: Methicillin (oxacillin)-resistant Staphylococcus aureus (MRSA). MRSA is predictably resistant to beta-lactam antibiotics (except ceftaroline). Preferred therapy is vancomycin unless clinically contraindicated. Patient requires contact precautions if  hospitalized. CRITICAL RESULT CALLED TO, READ BACK BY AND VERIFIED WITH: Peggyann Juba PHARMD 1191 03/22/17 A BROWNING    Methicillin resistance DETECTED (A) NOT DETECTED Final    Comment: CRITICAL RESULT CALLED TO, READ BACK BY AND VERIFIED WITH: Peggyann Juba PHARMD 4782 03/22/17 A BROWNING    Streptococcus species NOT DETECTED NOT DETECTED Final   Streptococcus agalactiae NOT DETECTED NOT DETECTED Final   Streptococcus pneumoniae NOT DETECTED NOT DETECTED Final   Streptococcus pyogenes NOT  DETECTED NOT DETECTED Final   Acinetobacter baumannii NOT DETECTED NOT DETECTED Final   Enterobacteriaceae species NOT DETECTED NOT DETECTED Final   Enterobacter cloacae complex NOT DETECTED NOT DETECTED Final   Escherichia coli NOT DETECTED NOT DETECTED Final   Klebsiella oxytoca NOT DETECTED NOT DETECTED Final   Klebsiella pneumoniae NOT DETECTED NOT DETECTED Final   Proteus species NOT DETECTED NOT DETECTED Final   Serratia marcescens NOT DETECTED NOT DETECTED Final   Haemophilus influenzae NOT DETECTED NOT DETECTED Final   Neisseria meningitidis NOT DETECTED NOT DETECTED Final   Pseudomonas aeruginosa NOT DETECTED NOT DETECTED Final  Candida albicans NOT DETECTED NOT DETECTED Final   Candida glabrata NOT DETECTED NOT DETECTED Final   Candida krusei NOT DETECTED NOT DETECTED Final   Candida parapsilosis NOT DETECTED NOT DETECTED Final   Candida tropicalis NOT DETECTED NOT DETECTED Final    Comment: Performed at [facility], [address] [place], [place] [address]  Culture, blood (routine x 2)     Status: Abnormal   Collection Time: 03/20/2017  1:07 PM  Result Value Ref Range Status   Specimen Description BLOOD BLOOD LEFT FOREARM  Final   Special Requests   Final    BOTTLES DRAWN AEROBIC AND ANAEROBIC Blood Culture adequate volume   Culture  Setup Time   Final    GRAM POSITIVE COCCI IN BOTH AEROBIC AND ANAEROBIC BOTTLES CRITICAL RESULT CALLED TO, READ BACK BY AND VERIFIED WITH: Sophronia SimasM. BELL PHARMD, AT [id number] 03/22/17 BY D. VANHOOK    Culture (A)  Final    STAPHYLOCOCCUS AUREUS SUSCEPTIBILITIES PERFORMED ON PREVIOUS CULTURE WITHIN THE LAST 5 DAYS. Performed at [facility], [address] [place], [place] [address]    Report Status 03/24/2017 FINAL  Final  Urine Culture     Status: Abnormal   Collection Time: 03/09/2017  3:36 PM  Result Value Ref Range Status   Specimen Description URINE, CLEAN CATCH  Final   Special Requests Normal  Final   Culture (A)  Final     >=100,000 COLONIES/mL METHICILLIN RESISTANT STAPHYLOCOCCUS AUREUS   Report Status 03/24/2017 FINAL  Final   Organism ID, Bacteria METHICILLIN RESISTANT STAPHYLOCOCCUS AUREUS (A)  Final      Susceptibility   Methicillin resistant staphylococcus aureus - MIC*    CIPROFLOXACIN <=0.5 SENSITIVE Sensitive     GENTAMICIN <=0.5 SENSITIVE Sensitive     NITROFURANTOIN <=16 SENSITIVE Sensitive     OXACILLIN >=4 RESISTANT Resistant     TETRACYCLINE <=1 SENSITIVE Sensitive     VANCOMYCIN <=0.5 SENSITIVE Sensitive     TRIMETH/SULFA <=10 SENSITIVE Sensitive     CLINDAMYCIN <=0.25 SENSITIVE Sensitive     RIFAMPIN <=0.5 SENSITIVE Sensitive     Inducible Clindamycin NEGATIVE Sensitive     * >=100,000 COLONIES/mL METHICILLIN RESISTANT STAPHYLOCOCCUS AUREUS  Culture, blood (routine x 2)     Status: Abnormal   Collection Time: 03/23/17  5:50 AM  Result Value Ref Range Status   Specimen Description BLOOD LEFT HAND  Final   Special Requests IN PEDIATRIC BOTTLE Blood Culture adequate volume  Final   Culture  Setup Time   Final    GRAM POSITIVE COCCI IN PEDIATRIC BOTTLE CRITICAL RESULT CALLED TO, READ BACK BY AND VERIFIED WITH: NICK GLOGOVAC AT 0743 ON [id number] BY SJW    Culture (A)  Final    STAPHYLOCOCCUS AUREUS SUSCEPTIBILITIES PERFORMED ON PREVIOUS CULTURE WITHIN THE LAST 5 DAYS. Performed at [facility], [address] [place], [place] [address]    Report Status 03/26/2017 FINAL  Final  Culture, blood (routine x 2)     Status: Abnormal   Collection Time: 03/23/17  5:50 AM  Result Value Ref Range Status   Specimen Description BLOOD LEFT ANTECUBITAL  Final   Special Requests IN PEDIATRIC BOTTLE Blood Culture adequate volume  Final   Culture  Setup Time   Final    GRAM POSITIVE COCCI IN PEDIATRIC BOTTLE CRITICAL RESULT CALLED TO, READ BACK BY AND VERIFIED WITH: NICK GLOGOVAC PHARMD AT 0741 ON [id number] BY SJW    Culture (A)  Final  STAPHYLOCOCCUS AUREUS SUSCEPTIBILITIES PERFORMED ON  PREVIOUS CULTURE WITHIN THE LAST 5 DAYS. Performed at [facility], [address] [place], [place] [address]    Report Status 03/26/2017 FINAL  Final  MRSA PCR Screening     Status: Abnormal   Collection Time: 03/20/2017  8:10 AM  Result Value Ref Range Status   MRSA by PCR POSITIVE (A) NEGATIVE Final    Comment:        The GeneXpert MRSA Assay (FDA approved for NASAL specimens only), is one component of a comprehensive MRSA colonization surveillance program. It is not intended to diagnose MRSA infection nor to guide or monitor treatment for MRSA infections. RESULT CALLED TO, READ BACK BY AND VERIFIED WITH: B.KAUR RN 1042 [id number] A.QUIZON   Culture, blood (Routine X 2) w Reflex to ID Panel     Status: None (Preliminary result)   Collection Time: 03/26/17 10:14 AM  Result Value Ref Range Status   Specimen Description BLOOD RIGHT ANTECUBITAL  Final   Special Requests   Final    BOTTLES DRAWN AEROBIC AND ANAEROBIC Blood Culture adequate volume   Culture   Final    NO GROWTH 2 DAYS Performed at [facility], [address], [place], [place] [address]    Report Status PENDING  Incomplete  Culture, blood (Routine X 2) w Reflex to ID Panel     Status: None (Preliminary result)   Collection Time: 03/26/17 10:14 AM  Result Value Ref Range Status   Specimen Description BLOOD LEFT ANTECUBITAL  Final   Special Requests   Final    BOTTLES DRAWN AEROBIC AND ANAEROBIC Blood Culture adequate volume   Culture   Final    NO GROWTH 2 DAYS Performed at [facility], [address], [place], [place] [address]    Report Status PENDING  Incomplete  Body fluid culture     Status: None   Collection Time: 03/26/17  1:04 PM  Result Value Ref Range Status   Specimen Description ANKLE SYNOVIAL  Final   Special Requests NONE  Final   Gram Stain   Final    NO ORGANISMS SEEN WBC PRESENT, PREDOMINANTLY PMN Gram Stain Report Called to,Read Back By and Verified With: D.BLOCK RN AT  1412 ON 03/26/16 BY S.VANHOORNE    Culture   Final    RARE METHICILLIN RESISTANT STAPHYLOCOCCUS AUREUS RESULT CALLED TO, READ BACK BY AND VERIFIED WITH: D BLOCK RN AT 1335 ON [id number] BY SJW Performed at [facility], [address], [place], [place] [address]    Report Status 03/29/2017 FINAL  Final   Organism ID, Bacteria METHICILLIN RESISTANT STAPHYLOCOCCUS AUREUS  Final      Susceptibility   Methicillin resistant staphylococcus aureus - MIC*    CIPROFLOXACIN <=0.5 SENSITIVE Sensitive     ERYTHROMYCIN >=8 RESISTANT Resistant     GENTAMICIN <=0.5 SENSITIVE Sensitive     OXACILLIN >=4 RESISTANT Resistant     TETRACYCLINE <=1 SENSITIVE Sensitive     VANCOMYCIN 1 SENSITIVE Sensitive     TRIMETH/SULFA <=10 SENSITIVE Sensitive     CLINDAMYCIN <=0.25 SENSITIVE Sensitive     RIFAMPIN <=0.5 SENSITIVE Sensitive     Inducible Clindamycin NEGATIVE Sensitive     * RARE METHICILLIN RESISTANT STAPHYLOCOCCUS AUREUS  Anaerobic culture     Status: None (Preliminary result)   Collection Time: 03/26/17  1:04 PM  Result Value Ref Range Status   Specimen Description ANKLE SYNOVIAL  Final   Special Requests  NONE  Final   Culture   Final    NO ANAEROBES ISOLATED; CULTURE IN PROGRESS FOR 5 DAYS   Report Status PENDING  Incomplete  Aerobic Culture (superficial specimen)     Status: None (Preliminary result)   Collection Time: 03/30/2017  3:53 PM  Result Value Ref Range Status   Specimen Description WOUND LEFT ANKLE  Final   Special Requests NONE  Final   Gram Stain   Final    RARE WBC PRESENT, PREDOMINANTLY PMN NO ORGANISMS SEEN    Culture   Final    CULTURE REINCUBATED FOR BETTER GROWTH Performed at [facility], [address] [place], [place] [address]    Report Status PENDING  Incomplete         Radiology Studies: Ct Chest Wo Contrast  Result Date: 03/27/2017 CLINICAL DATA:  Hemoptysis, abdominal pain, confusion, cirrhosis secondary to hepatitis C, diabetes mellitus,  leukocytosis, UTI, MRSA bacteremia EXAM: CT CHEST WITHOUT CONTRAST TECHNIQUE: Multidetector CT imaging of the chest was performed following the standard protocol without IV contrast. Sagittal and coronal MPR images reconstructed from axial data set. 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 Scattered atherosclerotic calcifications aorta and coronary arteries. No pericardial effusion. Mediastinum/Nodes: Esophagus unremarkable. Scattered normal size mediastinal lymph nodes without thoracic adenopathy. Base of cervical region unremarkable. Lungs/Pleura: Dependent atelectasis RIGHT lower lobe. Area of opacity at the lateral aspect of the RIGHT upper lobe anteriorly, 4.8 x 4.5 x 3.0 cm. Minimal surrounding infiltrate. This process extends into the anterolateral RIGHT chest wall with destruction of the RIGHT third rib company by pathologic fracture. Additionally, the RIGHT upper lobe process is contiguous through the chest wall with a subpectoral gas and fluid collection external to the ribs in the anterolateral upper RIGHT chest, 10.3 x 3.2 cm in greatest axial dimensions and extending for 5 cm length. Bronchiectasis and atelectasis at anterior base of RIGHT middle lobe. Remaining lungs clear. Upper Abdomen: Cirrhotic liver with multiple perigastric and perisplenic varices in the upper abdomen with a spontaneous splenorenal shunt better demonstrated on CT abdomen. Moderate ascites. Large area of developing low attenuation within the central spleen question splenic infarct versus developing splenic abscess new since 04/02/2017. Distended gallbladder. Musculoskeletal: No additional osseous findings. IMPRESSION: Consolidation at the anterolateral RIGHT upper lobe measuring 4.8 x 4.5 x 3.0 cm in size, contiguous with infiltrative process through the anterolateral RIGHT chest wall extending into the extra osseous soft tissues  deep to the RIGHT pectoralis muscle where a gas and fluid collection is seen measuring 10.3 x 3.2 x 5.0 cm. Findings may either represent RIGHT upper lobe pneumonia with extension of infection through the chest wall associated with osteomyelitis of the anterolateral RIGHT third rib and accompanying chest wall abscess or RIGHT upper lobe tumor extending involving the RIGHT upper lobe and chest wall. The lack of RIGHT upper lobe opacity or RIGHT third rib destruction on chest radiograph at presentation on 03/10/2017 makes infection (pneumonia with chest wall extension and subpectoral abscess) a more likely etiology than tumor. Cirrhotic liver with numerous varices in the upper abdomen and a new area of low attenuation within the central spleen which could represent an infarct or developing infection/abscess. Electronically Signed   By: Ulyses Southward M.D.   On: 03/27/2017 16:09        Scheduled Meds: . aspirin EC  81 mg Oral Daily  . gabapentin  600 mg Oral BID  .  insulin aspart  0-15 Units Subcutaneous TID WC  . insulin aspart  0-5 Units Subcutaneous QHS  . insulin detemir  15 Units Subcutaneous QHS  . lactulose  30 g Oral TID  . multivitamin with minerals  1 tablet Oral Daily  . mupirocin ointment  1 application Nasal BID  . pantoprazole (PROTONIX) IV  40 mg Intravenous Q12H  . propranolol  20 mg Oral BID  . rifaximin  550 mg Oral BID  . sodium chloride flush  3 mL Intravenous Q12H  . tamsulosin  0.4 mg Oral Daily  . venlafaxine  150 mg Oral Q breakfast  . venlafaxine  75 mg Oral QHS   Continuous Infusions: . sodium chloride    . sodium chloride 20 mL/hr at 03/29/17 1114  . vancomycin Stopped (03/22/2017 2305)     LOS: 8 days    Time spent: 35 mins    Ramiro Harvest, MD [facility] Pager [phone]  If 7PM-7AM, please contact night-coverage [email or web address] Password [facility] 03/29/2017, 12:22 PM

## 2017-03-29 NOTE — Progress Notes (Addendum)
Pharmacy Antibiotic Note  Kurt Reid is a 67 y.o. male with PMH HepC with cirrhosis, DM, bipolar, admitted on 03/19/2017 for abdominal pain, confusion; found to have complicated MRSA bacteremia. Pharmacy has been consulted for Vancomycin dosing.  Today, 03/29/2017  Day #8 vancomycin  1st repeat BCx still with MRSA; 2nd repeat BCx NGTD  SCr now increasing, up to 1.71 today  WBC previously improved, now trending back up   Remains afebrile  Refused TEE, however Cardiologist feels too high risk for TEE given varices and recent bleeding; recommends full endocarditis Tx  L ankle effusion noted and aspirated d/t concern for septic arthritis  New consolidation seen on 1/24 CT: per MD, likely septic emboli to lungs - which now has developed into abscess with extension to chest wall   Plan:  With worsening renal function, decrease Vancomycin dose to 1250mg  IV q24h.  Plan for peak/trough levels at new steady state (or random level earlier if renal function continues to decline)  Monitor renal function (daily SCr), cultures, clinical course.   Height: 6\' 1"  (185.4 cm) Weight: 233 lb 4 oz (105.8 kg) IBW/kg (Calculated) : 79.9  Temp (24hrs), Avg:98.2 F (36.8 C), Min:97.8 F (36.6 C), Max:98.4 F (36.9 C)  Recent Labs  Lab 03/24/17 0553 03/24/17 1304 2017/07/14 0730 03/26/17 0617 03/27/17 0631 03/27/17 1332 03/05/2017 0600 03/29/17 0632  WBC 24.2*  --  16.8* 17.6* 21.6* 22.5* 29.1* 31.2*  CREATININE 1.50*  --  1.32* 1.32* 1.30*  --  1.18 1.71*  VANCOTROUGH 16  --  18  --   --   --   --   --   VANCOPEAK  --  36  --   --   --   --   --   --     Estimated Creatinine Clearance: 53.5 mL/min (A) (by C-G formula based on SCr of 1.71 mg/dL (H)).    Allergies  Allergen Reactions  . Duloxetine     REACTION: memory dysfunction, dizziness  . Latex Other (See Comments)    Reaction unknown  . Levofloxacin Nausea Only  . Mercury Other (See Comments)    Reaction unknown  .  Sulfonamide Derivatives Other (See Comments)    Reaction unknown    Antimicrobials this admission:  Vancomycin 1/19 >> Ceftriaxone 1/18 >> 1/21  Dose adjustments/levels this admission:  1/20 VT = 16 on 1750 q24h 1/21 VPk (1304 - 2.5 hrs after end of infusion) = 36 mcg/mL 1/22 VT 0730 = 18 mcg/mL 1/22 change vanco to 1500mg  q24h 1/25 change vanco to 1750mg  q24h with improvement in renal function 12/26 change vanco to 1250mg  q24h with declining renal function  Microbiology results:  1/18 UCx: >100k MRSA 1/18 BCx: 4/4 bottles MRSA 1/20 repeat BCx: 2/2 peds bottles MRSA 1/22 MRSA PCR: positive 1/23 repeat BCx (4 bottles): NGTD 1/23 synovial fluid from ankle: rare MRSA 1/25 L ankle wound: cx incubated for better growth  Thank you for allowing pharmacy to be a part of this patient's care.   Greer PickerelJigna Mychaela Lennartz, PharmD, BCPS Pager: [phone] 03/29/2017 1:51 PM    Addendum:   [facility]~ 18 hours from prior dose) = 45 mcg/mL  Hold further Vancomycin doses for now  Recheck random Vancomycin level 1/27 at 1000  Will f/u renal function and Vancomycin level for subsequent dosing.  Greer PickerelJigna Jeryn Cerney, PharmD, BCPS Pager: [phone] 03/29/2017 5:55 PM

## 2017-03-29 NOTE — Progress Notes (Signed)
CRITICAL VALUE ALERT  Critical Value:  Vanc Trough is 45  Date & Time Notied:  03/29/2017 at 3.45pm from Lab  Provider Notified: Dr Janee Mornhompson  Orders Received/Actions taken: N/A

## 2017-03-29 NOTE — Progress Notes (Signed)
Subjective: 1 Day Post-Op Procedure(s) (LRB): IRRIGATION AND DEBRIDIMENT LEFT ANKLE (Left) Patient reports pain as moderate.    Objective: Vital signs in last 24 hours: Temp:  [97.8 F (36.6 C)-98.4 F (36.9 C)] 98.4 F (36.9 C) (01/26 0514) Pulse Rate:  [86-95] 95 (01/26 0514) Resp:  [12-20] 14 (01/26 0514) BP: (122-155)/(63-106) 155/93 (01/26 0514) SpO2:  [92 %-97 %] 92 % (01/26 0514)  Intake/Output from previous day: 01/25 0701 - 01/26 0700 In: 1350 [I.V.:850; IV Piggyback:500] Out: 705 [Urine:700; Blood:5] Intake/Output this shift: No intake/output data recorded.  Recent Labs    03/27/17 0631 03/27/17 1332 2017-07-28 0600 03/29/17 0632  HGB 12.5* 12.7* 12.5* 12.7*   Recent Labs    2017-07-28 0600 03/29/17 0632  WBC 29.1* 31.2*  RBC 4.10* 4.13*  HCT 37.2* 37.5*  PLT 160 217   Recent Labs    2017-07-28 0600 03/29/17 0632  NA 136 131*  K 5.0 5.2*  CL 108 107  CO2 21* 17*  BUN 52* 62*  CREATININE 1.18 1.71*  GLUCOSE 150* 160*  CALCIUM 8.0* 7.6*   No results for input(s): LABPT, INR in the last 72 hours.  Incision: dressing C/D/I and no drainage Compartment soft  Assessment/Plan: 1 Day Post-Op Procedure(s) (LRB): IRRIGATION AND DEBRIDIMENT LEFT ANKLE (Left) Up with therapy  Partial weight bearing left ankle  Penrose drain pulled no drainage  Elevate left ankle above heart encourage wiggling of toes  GILBERT CLARK 03/29/2017, 8:18 AM

## 2017-03-29 NOTE — Progress Notes (Signed)
Subjective:  C/o pain with foley catheter insertion   Antibiotics:  Anti-infectives (From admission, onward)   Start     Dose/Rate Route Frequency Ordered Stop   03/29/17 2200  vancomycin (VANCOCIN) 1,250 mg in sodium chloride 0.9 % 250 mL IVPB     1,250 mg 166.7 mL/hr over 90 Minutes Intravenous Every 24 hours 03/29/17 1341     04/10/17 2200  vancomycin (VANCOCIN) 1,750 mg in sodium chloride 0.9 % 500 mL IVPB  Status:  Discontinued     1,750 mg 250 mL/hr over 120 Minutes Intravenous Every 24 hours 04-10-2017 0856 03/29/17 1341   2017/04/10 1603  vancomycin (VANCOCIN) powder  Status:  Discontinued       As needed 04-10-17 1603 Apr 10, 2017 1626   03/27/17 2200  rifaximin (XIFAXAN) tablet 550 mg     550 mg Oral 2 times daily 03/27/17 1923     03/26/17 0800  vancomycin (VANCOCIN) 1,500 mg in sodium chloride 0.9 % 500 mL IVPB  Status:  Discontinued     1,500 mg 250 mL/hr over 120 Minutes Intravenous Every 24 hours 03/30/2017 1102 04/10/2017 0856   03/22/17 0600  vancomycin (VANCOCIN) 1,750 mg in sodium chloride 0.9 % 500 mL IVPB  Status:  Discontinued     1,750 mg 250 mL/hr over 120 Minutes Intravenous Every 24 hours 03/22/17 0547 03/19/2017 1102   03/04/2017 2000  cefTRIAXone (ROCEPHIN) 2 g in dextrose 5 % 50 mL IVPB  Status:  Discontinued     2 g 100 mL/hr over 30 Minutes Intravenous Every 24 hours 03/06/2017 1743 03/24/17 1421      Medications: Scheduled Meds: . aspirin EC  81 mg Oral Daily  . gabapentin  600 mg Oral BID  . insulin aspart  0-15 Units Subcutaneous TID WC  . insulin aspart  0-5 Units Subcutaneous QHS  . insulin detemir  15 Units Subcutaneous QHS  . lactulose  30 g Oral TID  . multivitamin with minerals  1 tablet Oral Daily  . mupirocin ointment  1 application Nasal BID  . pantoprazole (PROTONIX) IV  40 mg Intravenous Q12H  . propranolol  20 mg Oral BID  . rifaximin  550 mg Oral BID  . sodium chloride flush  3 mL Intravenous Q12H  . tamsulosin  0.4 mg Oral Daily    . venlafaxine  150 mg Oral Q breakfast  . venlafaxine  75 mg Oral QHS   Continuous Infusions: . sodium chloride    . sodium chloride 20 mL/hr at 03/29/17 1114  . vancomycin     PRN Meds:.sodium chloride, acetaminophen **OR** acetaminophen, albuterol, gadobenate dimeglumine, guaiFENesin-dextromethorphan, metoCLOPramide **OR** metoCLOPramide (REGLAN) injection, metoprolol tartrate, ondansetron **OR** ondansetron (ZOFRAN) IV, oxyCODONE, sodium chloride flush    Objective: Weight change:   Intake/Output Summary (Last 24 hours) at 03/29/2017 1356 Last data filed at 03/29/2017 1245 Gross per 24 hour  Intake 1590 ml  Output 705 ml  Net 885 ml   Blood pressure (!) 155/93, pulse 95, temperature 98.4 F (36.9 C), temperature source Oral, resp. rate 14, height 6\' 1"  (1.854 m), weight 233 lb 4 oz (105.8 kg), SpO2 92 %. Temp:  [97.8 F (36.6 C)-98.4 F (36.9 C)] 98.4 F (36.9 C) (01/26 0514) Pulse Rate:  [86-95] 95 (01/26 0514) Resp:  [12-20] 14 (01/26 0514) BP: (122-155)/(63-106) 155/93 (01/26 0514) SpO2:  [92 %-97 %] 92 % (01/26 0514)  Physical Exam: General: Alert and awake confused, moaning HEENT:  EOMI CVS regular rate, normal  r,   Chest:  no wheezing, chest wall is tender Abdomen: soft nontender, distended Extremities:left ankle bandaged Skin: no rashes  Neuro: nonfocal  CBC:  CBC Latest Ref Rng & Units 03/29/2017 03/07/2017 03/27/2017  WBC 4.0 - 10.5 K/uL 31.2(H) 29.1(H) 22.5(H)  Hemoglobin 13.0 - 17.0 g/dL 12.7(L) 12.5(L) 12.7(L)  Hematocrit 39.0 - 52.0 % 37.5(L) 37.2(L) 37.2(L)  Platelets 150 - 400 K/uL 217 160 170      BMET Recent Labs    03/16/2017 0600 03/29/17 0632  NA 136 131*  K 5.0 5.2*  CL 108 107  CO2 21* 17*  GLUCOSE 150* 160*  BUN 52* 62*  CREATININE 1.18 1.71*  CALCIUM 8.0* 7.6*     Liver Panel  Recent Labs    03/27/17 0631 03/31/2017 0600  PROT 6.5 6.5  ALBUMIN 1.7* 1.5*  AST 116* 123*  ALT 54 55  ALKPHOS 320* 296*  BILITOT 8.5* 9.2*        Sedimentation Rate No results for input(s): ESRSEDRATE in the last 72 hours. C-Reactive Protein No results for input(s): CRP in the last 72 hours.  Micro Results: Recent Results (from the past 720 hour(s))  Culture, blood (routine x 2)     Status: Abnormal   Collection Time: 03/15/2017 11:31 AM  Result Value Ref Range Status   Specimen Description BLOOD BLOOD RIGHT FOREARM  Final   Special Requests   Final    BOTTLES DRAWN AEROBIC AND ANAEROBIC Blood Culture adequate volume   Culture  Setup Time   Final    GRAM POSITIVE COCCI IN BOTH AEROBIC AND ANAEROBIC BOTTLES CRITICAL RESULT CALLED TO, READ BACK BY AND VERIFIED WITHPeggyann Juba [facility] 4098 03/22/17 A BROWNING Performed at [facility], [address], [place], [place] [address]    Culture METHICILLIN RESISTANT STAPHYLOCOCCUS AUREUS (A)  Final   Report Status 03/24/2017 FINAL  Final   Organism ID, Bacteria METHICILLIN RESISTANT STAPHYLOCOCCUS AUREUS  Final      Susceptibility   Methicillin resistant staphylococcus aureus - MIC*    CIPROFLOXACIN <=0.5 SENSITIVE Sensitive     ERYTHROMYCIN >=8 RESISTANT Resistant     GENTAMICIN <=0.5 SENSITIVE Sensitive     OXACILLIN RESISTANT Resistant     TETRACYCLINE <=1 SENSITIVE Sensitive     VANCOMYCIN <=0.5 SENSITIVE Sensitive     TRIMETH/SULFA <=10 SENSITIVE Sensitive     CLINDAMYCIN <=0.25 SENSITIVE Sensitive     RIFAMPIN <=0.5 SENSITIVE Sensitive     Inducible Clindamycin NEGATIVE Sensitive     * METHICILLIN RESISTANT STAPHYLOCOCCUS AUREUS  Blood Culture ID Panel (Reflexed)     Status: Abnormal   Collection Time: 03/16/2017 11:31 AM  Result Value Ref Range Status   Enterococcus species NOT DETECTED NOT DETECTED Final   Listeria monocytogenes NOT DETECTED NOT DETECTED Final   Staphylococcus species DETECTED (A) NOT DETECTED Final    Comment: CRITICAL RESULT CALLED TO, READ BACK BY AND VERIFIED WITHPeggyann Juba PHARMD [id number] 03/22/17 A BROWNING    Staphylococcus aureus  DETECTED (A) NOT DETECTED Final    Comment: Methicillin (oxacillin)-resistant Staphylococcus aureus (MRSA). MRSA is predictably resistant to beta-lactam antibiotics (except ceftaroline). Preferred therapy is vancomycin unless clinically contraindicated. Patient requires contact precautions if  hospitalized. CRITICAL RESULT CALLED TO, READ BACK BY AND VERIFIED WITHPeggyann Juba PHARMD [id number] 03/22/17 A BROWNING    Methicillin resistance DETECTED (A) NOT DETECTED Final    Comment: CRITICAL RESULT CALLED TO, READ BACK BY AND VERIFIED WITHPeggyann Juba PHARMD 3086 03/22/17 A BROWNING  Streptococcus species NOT DETECTED NOT DETECTED Final   Streptococcus agalactiae NOT DETECTED NOT DETECTED Final   Streptococcus pneumoniae NOT DETECTED NOT DETECTED Final   Streptococcus pyogenes NOT DETECTED NOT DETECTED Final   Acinetobacter baumannii NOT DETECTED NOT DETECTED Final   Enterobacteriaceae species NOT DETECTED NOT DETECTED Final   Enterobacter cloacae complex NOT DETECTED NOT DETECTED Final   Escherichia coli NOT DETECTED NOT DETECTED Final   Klebsiella oxytoca NOT DETECTED NOT DETECTED Final   Klebsiella pneumoniae NOT DETECTED NOT DETECTED Final   Proteus species NOT DETECTED NOT DETECTED Final   Serratia marcescens NOT DETECTED NOT DETECTED Final   Haemophilus influenzae NOT DETECTED NOT DETECTED Final   Neisseria meningitidis NOT DETECTED NOT DETECTED Final   Pseudomonas aeruginosa NOT DETECTED NOT DETECTED Final   Candida albicans NOT DETECTED NOT DETECTED Final   Candida glabrata NOT DETECTED NOT DETECTED Final   Candida krusei NOT DETECTED NOT DETECTED Final   Candida parapsilosis NOT DETECTED NOT DETECTED Final   Candida tropicalis NOT DETECTED NOT DETECTED Final    Comment: Performed at [facility], [address] [place], [place] [address]  Culture, blood (routine x 2)     Status: Abnormal   Collection Time: 03/19/2017  1:07 PM  Result Value Ref Range Status   Specimen  Description BLOOD BLOOD LEFT FOREARM  Final   Special Requests   Final    BOTTLES DRAWN AEROBIC AND ANAEROBIC Blood Culture adequate volume   Culture  Setup Time   Final    GRAM POSITIVE COCCI IN BOTH AEROBIC AND ANAEROBIC BOTTLES CRITICAL RESULT CALLED TO, READ BACK BY AND VERIFIED WITH: Sophronia Simas PHARMD, AT 6045 03/22/17 BY D. VANHOOK    Culture (A)  Final    STAPHYLOCOCCUS AUREUS SUSCEPTIBILITIES PERFORMED ON PREVIOUS CULTURE WITHIN THE LAST 5 DAYS. Performed at [facility], [address] [place], [place] [address]    Report Status 03/24/2017 FINAL  Final  Urine Culture     Status: Abnormal   Collection Time: 03/14/2017  3:36 PM  Result Value Ref Range Status   Specimen Description URINE, CLEAN CATCH  Final   Special Requests Normal  Final   Culture (A)  Final    >=100,000 COLONIES/mL METHICILLIN RESISTANT STAPHYLOCOCCUS AUREUS   Report Status 03/24/2017 FINAL  Final   Organism ID, Bacteria METHICILLIN RESISTANT STAPHYLOCOCCUS AUREUS (A)  Final      Susceptibility   Methicillin resistant staphylococcus aureus - MIC*    CIPROFLOXACIN <=0.5 SENSITIVE Sensitive     GENTAMICIN <=0.5 SENSITIVE Sensitive     NITROFURANTOIN <=16 SENSITIVE Sensitive     OXACILLIN >=4 RESISTANT Resistant     TETRACYCLINE <=1 SENSITIVE Sensitive     VANCOMYCIN <=0.5 SENSITIVE Sensitive     TRIMETH/SULFA <=10 SENSITIVE Sensitive     CLINDAMYCIN <=0.25 SENSITIVE Sensitive     RIFAMPIN <=0.5 SENSITIVE Sensitive     Inducible Clindamycin NEGATIVE Sensitive     * >=100,000 COLONIES/mL METHICILLIN RESISTANT STAPHYLOCOCCUS AUREUS  Culture, blood (routine x 2)     Status: Abnormal   Collection Time: 03/23/17  5:50 AM  Result Value Ref Range Status   Specimen Description BLOOD LEFT HAND  Final   Special Requests IN PEDIATRIC BOTTLE Blood Culture adequate volume  Final   Culture  Setup Time   Final    GRAM POSITIVE COCCI IN PEDIATRIC BOTTLE CRITICAL RESULT CALLED TO, READ BACK BY AND VERIFIED WITH:  NICK GLOGOVAC AT 0743 ON [id number] BY SJW  Culture (A)  Final    STAPHYLOCOCCUS AUREUS SUSCEPTIBILITIES PERFORMED ON PREVIOUS CULTURE WITHIN THE LAST 5 DAYS. Performed at [facility], [address], [place], [place] [address]    Report Status 03/26/2017 FINAL  Final  Culture, blood (routine x 2)     Status: Abnormal   Collection Time: 03/23/17  5:50 AM  Result Value Ref Range Status   Specimen Description BLOOD LEFT ANTECUBITAL  Final   Special Requests IN PEDIATRIC BOTTLE Blood Culture adequate volume  Final   Culture  Setup Time   Final    GRAM POSITIVE COCCI IN PEDIATRIC BOTTLE CRITICAL RESULT CALLED TO, READ BACK BY AND VERIFIED WITH: NICK GLOGOVAC PHARMD AT 0741 ON [id number] BY SJW    Culture (A)  Final    STAPHYLOCOCCUS AUREUS SUSCEPTIBILITIES PERFORMED ON PREVIOUS CULTURE WITHIN THE LAST 5 DAYS. Performed at [facility], [address], [place], [place] [address]    Report Status 03/26/2017 FINAL  Final  MRSA PCR Screening     Status: Abnormal   Collection Time: 03/26/2017  8:10 AM  Result Value Ref Range Status   MRSA by PCR POSITIVE (A) NEGATIVE Final    Comment:        The GeneXpert MRSA Assay (FDA approved for NASAL specimens only), is one component of a comprehensive MRSA colonization surveillance program. It is not intended to diagnose MRSA infection nor to guide or monitor treatment for MRSA infections. RESULT CALLED TO, READ BACK BY AND VERIFIED WITH: B.KAUR RN 1042 [id number] A.QUIZON   Culture, blood (Routine X 2) w Reflex to ID Panel     Status: None (Preliminary result)   Collection Time: 03/26/17 10:14 AM  Result Value Ref Range Status   Specimen Description BLOOD RIGHT ANTECUBITAL  Final   Special Requests   Final    BOTTLES DRAWN AEROBIC AND ANAEROBIC Blood Culture adequate volume   Culture   Final    NO GROWTH 2 DAYS Performed at [facility], [address] [place], [place] [address]    Report Status PENDING  Incomplete  Culture,  blood (Routine X 2) w Reflex to ID Panel     Status: None (Preliminary result)   Collection Time: 03/26/17 10:14 AM  Result Value Ref Range Status   Specimen Description BLOOD LEFT ANTECUBITAL  Final   Special Requests   Final    BOTTLES DRAWN AEROBIC AND ANAEROBIC Blood Culture adequate volume   Culture   Final    NO GROWTH 2 DAYS Performed at [facility], [address], [place], [place] [address]    Report Status PENDING  Incomplete  Body fluid culture     Status: None   Collection Time: 03/26/17  1:04 PM  Result Value Ref Range Status   Specimen Description ANKLE SYNOVIAL  Final   Special Requests NONE  Final   Gram Stain   Final    NO ORGANISMS SEEN WBC PRESENT, PREDOMINANTLY PMN Gram Stain Report Called to,Read Back By and Verified With: D.BLOCK RN AT 1412 ON 03/26/16 BY S.VANHOORNE    Culture   Final    RARE METHICILLIN RESISTANT STAPHYLOCOCCUS AUREUS RESULT CALLED TO, READ BACK BY AND VERIFIED WITH: D BLOCK RN AT 1335 ON [id number] BY SJW Performed at [facility], [address] [place], [place] [address]    Report Status 03/29/2017 FINAL  Final   Organism ID, Bacteria METHICILLIN RESISTANT STAPHYLOCOCCUS AUREUS  Final  Susceptibility   Methicillin resistant staphylococcus aureus - MIC*    CIPROFLOXACIN <=0.5 SENSITIVE Sensitive     ERYTHROMYCIN >=8 RESISTANT Resistant     GENTAMICIN <=0.5 SENSITIVE Sensitive     OXACILLIN >=4 RESISTANT Resistant     TETRACYCLINE <=1 SENSITIVE Sensitive     VANCOMYCIN 1 SENSITIVE Sensitive     TRIMETH/SULFA <=10 SENSITIVE Sensitive     CLINDAMYCIN <=0.25 SENSITIVE Sensitive     RIFAMPIN <=0.5 SENSITIVE Sensitive     Inducible Clindamycin NEGATIVE Sensitive     * RARE METHICILLIN RESISTANT STAPHYLOCOCCUS AUREUS  Anaerobic culture     Status: None (Preliminary result)   Collection Time: 03/26/17  1:04 PM  Result Value Ref Range Status   Specimen Description ANKLE SYNOVIAL  Final   Special Requests NONE  Final    Culture   Final    NO ANAEROBES ISOLATED; CULTURE IN PROGRESS FOR 5 DAYS   Report Status PENDING  Incomplete  Aerobic Culture (superficial specimen)     Status: None (Preliminary result)   Collection Time: 03/09/2017  3:53 PM  Result Value Ref Range Status   Specimen Description WOUND LEFT ANKLE  Final   Special Requests NONE  Final   Gram Stain   Final    RARE WBC PRESENT, PREDOMINANTLY PMN NO ORGANISMS SEEN    Culture   Final    CULTURE REINCUBATED FOR BETTER GROWTH Performed at [facility], [address] [place], [place] [id number]    Report Status PENDING  Incomplete    Studies/Results: Ct Chest Wo Contrast  Result Date: 03/27/2017 CLINICAL DATA:  Hemoptysis, abdominal pain, confusion, cirrhosis secondary to hepatitis C, diabetes mellitus, leukocytosis, UTI, MRSA bacteremia EXAM: CT CHEST WITHOUT CONTRAST TECHNIQUE: Multidetector CT imaging of the chest was performed following the standard protocol without IV contrast. Sagittal and coronal MPR images reconstructed from axial data set. 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 Scattered atherosclerotic calcifications aorta and coronary arteries. No pericardial effusion. Mediastinum/Nodes: Esophagus unremarkable. Scattered normal size mediastinal lymph nodes without thoracic adenopathy. Base of cervical region unremarkable. Lungs/Pleura: Dependent atelectasis RIGHT lower lobe. Area of opacity at the lateral aspect of the RIGHT upper lobe anteriorly, 4.8 x 4.5 x 3.0 cm. Minimal surrounding infiltrate. This process extends into the anterolateral RIGHT chest wall with destruction of the RIGHT third rib company by pathologic fracture. Additionally, the RIGHT upper lobe process is contiguous through the chest wall with a subpectoral gas and fluid collection external to the ribs in the anterolateral upper RIGHT chest, 10.3 x 3.2 cm in  greatest axial dimensions and extending for 5 cm length. Bronchiectasis and atelectasis at anterior base of RIGHT middle lobe. Remaining lungs clear. Upper Abdomen: Cirrhotic liver with multiple perigastric and perisplenic varices in the upper abdomen with a spontaneous splenorenal shunt better demonstrated on CT abdomen. Moderate ascites. Large area of developing low attenuation within the central spleen question splenic infarct versus developing splenic abscess new since 03/30/2017. Distended gallbladder. Musculoskeletal: No additional osseous findings. IMPRESSION: Consolidation at the anterolateral RIGHT upper lobe measuring 4.8 x 4.5 x 3.0 cm in size, contiguous with infiltrative process through the anterolateral RIGHT chest wall extending into the extra osseous soft tissues deep to the RIGHT pectoralis muscle where a gas and fluid collection is seen measuring 10.3 x 3.2 x 5.0 cm. Findings may either represent RIGHT upper lobe pneumonia with extension of infection through the chest wall associated with  osteomyelitis of the anterolateral RIGHT third rib and accompanying chest wall abscess or RIGHT upper lobe tumor extending involving the RIGHT upper lobe and chest wall. The lack of RIGHT upper lobe opacity or RIGHT third rib destruction on chest radiograph at presentation on 03/10/2017 makes infection (pneumonia with chest wall extension and subpectoral abscess) a more likely etiology than tumor. Cirrhotic liver with numerous varices in the upper abdomen and a new area of low attenuation within the central spleen which could represent an infarct or developing infection/abscess. Electronically Signed   By: Ulyses Southward M.D.   On: 03/27/2017 16:09      Assessment/Plan:  INTERVAL HISTORY: patient still not yet seen by CVTS re chest wall abscess    Principal Problem:   Delirium due to another medical condition Active Problems:   BIPOLAR AFFECTIVE DISORDER   Chronic pain syndrome   Esophageal varices  (HCC)   Hepatic cirrhosis due to chronic hepatitis C infection (HCC)   Hemoptysis   DM type 2 without retinopathy (HCC)   Chronic hepatitis C (HCC)   Leukocytosis   Sepsis (HCC)   Altered mental status   UTI (urinary tract infection)   Ankle pain, left   Left ankle effusion   Hematemesis   Acute urinary retention    ALA CAPRI is a 67 y.o. male with  with treated hep c cirrhosis admitted for MRSA bacteremia, found to have left ankle septic arthritis plus new right lung abscess with associated osteo/chest wall abscess  #1 Metastatic MRSA infection       [place] [facility] Staphylococcus aureus bacteremia   Staphylococcus aureus bacteremia (SAB) is associated with a high rate of complications and mortality.  Specific aspects of clinical management are critical to optimizing the outcome of patients with SAB.  Therefore, the [facility] Antimicrobial Management Team [facility]) has initiated an intervention aimed at improving the management of SAB at [facility].  To do so, Infectious Diseases physicians are providing an evidence-based consult for the management of all patients with SAB.     Yes No Comments  Perform follow-up blood cultures (even if the patient is afebrile) to ensure clearance of bacteremia [x]  []  Cultures from 23rd NGTD  Remove vascular catheter and obtain follow-up blood cultures after the removal of the catheter []  []  DO NOT PLACE PICC OR CENTRAL LINE  Perform echocardiography to evaluate for endocarditis (transthoracic ECHO is 40-50% sensitive, TEE is > 90% sensitive) []  []  Please keep in mind, that neither test can definitively EXCLUDE endocarditis, and that should clinical suspicion remain high for endocarditis the patient should then still be treated with an "endocarditis" duration of therapy = 6 weeks he is going to get 6-8 weeks of IV abx  Consult electrophysiologist to evaluate implanted cardiac device (pacemaker, ICD) []  []    Ensure  source control []  []  Have all abscesses been drained effectively? Have deep seeded infections (septic joints or osteomyelitis) had appropriate surgical debridement?   NO HE MAY NEED CT SURGERY   Investigate for "metastatic" sites of infection []  []  Does the patient have ANY symptom or physical exam finding that would suggest a deeper infection (back or neck pain that may be suggestive of vertebral osteomyelitis or epidural abscess, muscle pain that could be a symptom of pyomyositis)?  Keep in mind that for deep seeded infections MRI imaging with contrast is preferred rather than other often insensitive tests such as plain x-rays, especially early in a patient's presentation. LUNGS AND ANKLE ARE ONLY  KNOWN SITES BESIDES BLOODSTREAM  Change antibiotic therapy to VANCOMYCIN []  []  Beta-lactam antibiotics are preferred for MSSA due to higher cure rates.   If on Vancomycin, goal trough should be 15 - 20 mcg/mL  Estimated duration of IV antibiotic therapy:  8 WEEKS []  []  Consult case management for probably prolonged outpatient IV antibiotic therapy        LOS: 8 days   Acey Lav 03/29/2017, 1:56 PM

## 2017-03-29 NOTE — Progress Notes (Signed)
Pt ripped foley cath out. Notified Dr Janee Mornhompson and will leave f/cath out until the family decide tonight if they want it put back in. Pt is retaining urine per Dr Janee Mornhompson. New orders received.

## 2017-03-29 NOTE — Progress Notes (Signed)
1 Day Post-Op Procedure(s) (LRB): IRRIGATION AND DEBRIDIMENT LEFT ANKLE (Left) Subjective: Patient examined, CT of chest images and echocardiogram images personally reviewed and discussed with patient's wife  67 year old edentulous male with history of hepatitis C and cirrhosis admitted with MRSA bacteremia and an abscess in the left ankle which was debrided and drained. The patient has had a persistent elevated white count but is now afebrile.  CT scan of the chest shows right upper anterior-medial chest wall mass with destruction of the third rib which appears pleural-based and involves the anterior segment of the right upper lobe. The patient had an echocardiogram which showed normal cardiac function no valvular disease - vegetations. On exam the patient's right anterior chest wall is very unimpressive without focal palpable mass or tenderness. I'm concerned this presents a malignant process which may be superinfected. I will ask IR to perform a core biopsy of the right chest wall mass initially. Pending the results of this biopsy and how the patient responds to antibiotics will determine whether surgical debridement is necessary.    ective: Vital signs in last 24 hours: Temp:  [98.1 F (36.7 C)-98.4 F (36.9 C)] 98.2 F (36.8 C) (01/26 1503) Pulse Rate:  [85-95] 85 (01/26 1503) Cardiac Rhythm: Normal sinus rhythm (01/26 0700) Resp:  [12-18] 18 (01/26 1503) BP: (132-155)/(63-96) 148/96 (01/26 1503) SpO2:  [92 %-97 %] 93 % (01/26 1503)  Hemodynamic parameters for last 24 hours:   afebrile  Intake/Output from previous day: 01/25 0701 - 01/26 0700 In: 1350 [I.V.:850; IV Piggyback:500] Out: 705 [Urine:700; Blood:5] Intake/Output this shift: Total I/O In: 240 [P.O.:240] Out: 300 [Urine:300]  Exam   obese middle-aged male with altered level of consciousness He is edentulous No palpable adenopathy in the neck or JVD Breath sounds are clear No chest wall deformity or  tenderness Cardiac rhythm regular without murmur Abdomen obese with probable mild ascites Left ankle wrapped in a surgical dressing  ab Results: Recent Labs    03/16/2017 0600 03/29/17 0632  WBC 29.1* 31.2*  HGB 12.5* 12.7*  HCT 37.2* 37.5*  PLT 160 217   BMET:  Recent Labs    03/17/2017 0600 03/29/17 0632  NA 136 131*  K 5.0 5.2*  CL 108 107  CO2 21* 17*  GLUCOSE 150* 160*  BUN 52* 62*  CREATININE 1.18 1.71*  CALCIUM 8.0* 7.6*    PT/INR: No results for input(s): LABPROT, INR in the last 72 hours. ABG No results found for: PHART, HCO3, TCO2, ACIDBASEDEF, O2SAT CBG (last 3)  Recent Labs    03/29/17 0750 03/29/17 1134 03/29/17 1655  GLUCAP 162* 144* 163*    Assessment/Plan: S/P Procedure(s) (LRB): IRRIGATION AND DEBRIDIMENT LEFT ANKLE (Left) Proceed with CT-guided core needle biopsy of right chest wall mass, continue IV antibiotics. Plan discussed with patient's wife.    LOS: 8 days    Kurt Nationseter Van Trigt III 03/29/2017

## 2017-03-29 NOTE — Progress Notes (Signed)
Pt has not voided since 2100, bladder scan reading 500 cc of urine. In and out cath performed per protocol. 350 cc of orange urine returned from in and out. Pt does not appear to be in distress at this time

## 2017-03-30 ENCOUNTER — Encounter (HOSPITAL_COMMUNITY): Payer: Self-pay | Admitting: Radiology

## 2017-03-30 ENCOUNTER — Inpatient Hospital Stay (HOSPITAL_COMMUNITY): Payer: Medicare Other

## 2017-03-30 DIAGNOSIS — K7290 Hepatic failure, unspecified without coma: Secondary | ICD-10-CM

## 2017-03-30 DIAGNOSIS — R222 Localized swelling, mass and lump, trunk: Secondary | ICD-10-CM | POA: Diagnosis present

## 2017-03-30 DIAGNOSIS — J852 Abscess of lung without pneumonia: Secondary | ICD-10-CM

## 2017-03-30 DIAGNOSIS — B9562 Methicillin resistant Staphylococcus aureus infection as the cause of diseases classified elsewhere: Secondary | ICD-10-CM

## 2017-03-30 DIAGNOSIS — R17 Unspecified jaundice: Secondary | ICD-10-CM

## 2017-03-30 DIAGNOSIS — L02213 Cutaneous abscess of chest wall: Secondary | ICD-10-CM

## 2017-03-30 DIAGNOSIS — IMO0002 Reserved for concepts with insufficient information to code with codable children: Secondary | ICD-10-CM

## 2017-03-30 DIAGNOSIS — E875 Hyperkalemia: Secondary | ICD-10-CM | POA: Diagnosis not present

## 2017-03-30 DIAGNOSIS — R188 Other ascites: Secondary | ICD-10-CM

## 2017-03-30 DIAGNOSIS — N19 Unspecified kidney failure: Secondary | ICD-10-CM

## 2017-03-30 DIAGNOSIS — K7469 Other cirrhosis of liver: Secondary | ICD-10-CM

## 2017-03-30 DIAGNOSIS — N179 Acute kidney failure, unspecified: Secondary | ICD-10-CM

## 2017-03-30 DIAGNOSIS — T797XXA Traumatic subcutaneous emphysema, initial encounter: Secondary | ICD-10-CM

## 2017-03-30 LAB — GLUCOSE, CAPILLARY
GLUCOSE-CAPILLARY: 52 mg/dL — AB (ref 65–99)
GLUCOSE-CAPILLARY: 191 mg/dL — AB (ref 65–99)
GLUCOSE-CAPILLARY: 237 mg/dL — AB (ref 65–99)
Glucose-Capillary: 187 mg/dL — ABNORMAL HIGH (ref 65–99)
Glucose-Capillary: 191 mg/dL — ABNORMAL HIGH (ref 65–99)

## 2017-03-30 LAB — PROTIME-INR
INR: 1.71
Prothrombin Time: 19.9 seconds — ABNORMAL HIGH (ref 11.4–15.2)

## 2017-03-30 LAB — BASIC METABOLIC PANEL
ANION GAP: 7 (ref 5–15)
BUN: 74 mg/dL — AB (ref 6–20)
CHLORIDE: 113 mmol/L — AB (ref 101–111)
CO2: 17 mmol/L — ABNORMAL LOW (ref 22–32)
Calcium: 7.8 mg/dL — ABNORMAL LOW (ref 8.9–10.3)
Creatinine, Ser: 2.2 mg/dL — ABNORMAL HIGH (ref 0.61–1.24)
GFR calc Af Amer: 34 mL/min — ABNORMAL LOW (ref >60)
GFR, EST NON AFRICAN AMERICAN: 29 mL/min — AB (ref >60)
GLUCOSE: 154 mg/dL — AB (ref 65–99)
POTASSIUM: 5.5 mmol/L — AB (ref 3.5–5.1)
Sodium: 137 mmol/L (ref 135–145)

## 2017-03-30 LAB — CBC WITH DIFFERENTIAL/PLATELET
BASOS ABS: 0 10*3/uL (ref 0.0–0.1)
BASOS PCT: 0 %
EOS ABS: 0.3 10*3/uL (ref 0.0–0.7)
Eosinophils Relative: 1 %
HEMATOCRIT: 36.5 % — AB (ref 39.0–52.0)
Hemoglobin: 12.4 g/dL — ABNORMAL LOW (ref 13.0–17.0)
LYMPHS ABS: 2.6 10*3/uL (ref 0.7–4.0)
LYMPHS PCT: 10 %
MCH: 31.1 pg (ref 26.0–34.0)
MCHC: 34 g/dL (ref 30.0–36.0)
MCV: 91.5 fL (ref 78.0–100.0)
MONOS PCT: 6 %
Monocytes Absolute: 1.6 10*3/uL — ABNORMAL HIGH (ref 0.1–1.0)
NEUTROS ABS: 21.7 10*3/uL — AB (ref 1.7–7.7)
Neutrophils Relative %: 83 %
Platelets: 182 10*3/uL (ref 150–400)
RBC: 3.99 MIL/uL — ABNORMAL LOW (ref 4.22–5.81)
RDW: 17.3 % — AB (ref 11.5–15.5)
WBC: 26.2 10*3/uL — ABNORMAL HIGH (ref 4.0–10.5)

## 2017-03-30 LAB — AMMONIA: AMMONIA: 82 umol/L — AB (ref 9–35)

## 2017-03-30 LAB — POTASSIUM
POTASSIUM: 6 mmol/L — AB (ref 3.5–5.1)
Potassium: 6 mmol/L — ABNORMAL HIGH (ref 3.5–5.1)

## 2017-03-30 LAB — HEPATIC FUNCTION PANEL
ALK PHOS: 252 U/L — AB (ref 38–126)
ALT: 60 U/L (ref 17–63)
AST: 152 U/L — AB (ref 15–41)
Albumin: 1.6 g/dL — ABNORMAL LOW (ref 3.5–5.0)
BILIRUBIN DIRECT: 6.6 mg/dL — AB (ref 0.1–0.5)
BILIRUBIN INDIRECT: 5.1 mg/dL — AB (ref 0.3–0.9)
Total Bilirubin: 11.7 mg/dL — ABNORMAL HIGH (ref 0.3–1.2)
Total Protein: 6.8 g/dL (ref 6.5–8.1)

## 2017-03-30 LAB — VANCOMYCIN, RANDOM: Vancomycin Rm: 36

## 2017-03-30 MED ORDER — ALBUMIN HUMAN 25 % IV SOLN
50.0000 g | Freq: Once | INTRAVENOUS | Status: AC
  Administered 2017-03-31: 50 g via INTRAVENOUS
  Filled 2017-03-30: qty 50
  Filled 2017-03-30: qty 200

## 2017-03-30 MED ORDER — SODIUM POLYSTYRENE SULFONATE 15 GM/60ML PO SUSP: 45.0000 g | Freq: Once | ORAL | Status: DC

## 2017-03-30 MED ORDER — SODIUM CHLORIDE 0.9 % IV SOLN
400.0000 mg | Freq: Two times a day (BID) | INTRAVENOUS | Status: DC
  Administered 2017-03-30 – 2017-04-02 (×7): 400 mg via INTRAVENOUS
  Filled 2017-03-30 (×11): qty 400

## 2017-03-30 MED ORDER — SODIUM POLYSTYRENE SULFONATE PO POWD
45.0000 g | Freq: Once | ORAL | Status: DC
  Filled 2017-03-30: qty 45

## 2017-03-30 MED ORDER — INSULIN ASPART 100 UNIT/ML IV SOLN
10.0000 [IU] | Freq: Once | INTRAVENOUS | Status: AC
  Administered 2017-03-30: 10 [IU] via INTRAVENOUS
  Filled 2017-03-30: qty 0.1

## 2017-03-30 MED ORDER — SODIUM POLYSTYRENE SULFONATE PO POWD
45.0000 g | Freq: Once | ORAL | Status: AC
  Administered 2017-03-30: 45 g via ORAL
  Filled 2017-03-30: qty 45

## 2017-03-30 MED ORDER — MORPHINE SULFATE (PF) 4 MG/ML IV SOLN
1.0000 mg | INTRAVENOUS | Status: DC | PRN
  Administered 2017-03-30 – 2017-04-01 (×4): 2 mg via INTRAVENOUS
  Administered 2017-04-01: 1 mg via INTRAVENOUS
  Administered 2017-04-01 – 2017-04-03 (×5): 2 mg via INTRAVENOUS
  Filled 2017-03-30 (×10): qty 1

## 2017-03-30 MED ORDER — FUROSEMIDE 10 MG/ML IJ SOLN
40.0000 mg | Freq: Two times a day (BID) | INTRAMUSCULAR | Status: DC
  Administered 2017-03-30 – 2017-04-03 (×8): 40 mg via INTRAVENOUS
  Filled 2017-03-30 (×8): qty 4

## 2017-03-30 MED ORDER — DEXTROSE 50 % IV SOLN
1.0000 | Freq: Once | INTRAVENOUS | Status: AC
  Administered 2017-03-30: 50 mL via INTRAVENOUS
  Filled 2017-03-30: qty 50

## 2017-03-30 NOTE — Progress Notes (Addendum)
Pharmacy Antibiotic Note  Kurt Reid is a 67 y.o. male with PMH HepC with cirrhosis, DM, bipolar, admitted on 03/17/2017 for abdominal pain, confusion; found to have complicated MRSA bacteremia. Pharmacy has been consulted for Vancomycin dosing.  Today, 03/30/2017  Day #9 vancomycin  1st repeat BCx still with MRSA; 2nd repeat BCx NGTD  SCr continuing to increase, up to 2.2 today  WBC elevated, but now trending down    Remains afebrile  Refused TEE, however Cardiologist feels too high risk for TEE given varices and recent bleeding; recommends full endocarditis Tx  L ankle effusion noted and aspirated d/t concern for septic arthritis  New consolidation seen on 1/24 CT: per MD, likely septic emboli to lungs - which now has developed into abscess with extension to chest wall  Plan for CT-guided core needle biopsy of right chest wall mass per MD notes  Random Vancomycin level this AM = 36 mcg/mL (~ 36h from last dose)   Plan:  Continue to hold Vancomycin for now.  Repeat SCr and random Vancomycin level in AM to dictate further dosing. Re-dose when level < 20 mcg/mL.   Await input from ID-? Change to different agent  Monitor renal function (daily SCr), cultures, clinical course.   Height: 6\' 1"  (185.4 cm) Weight: 244 lb 14.9 oz (111.1 kg) IBW/kg (Calculated) : 79.9  Temp (24hrs), Avg:98.4 F (36.9 C), Min:98.1 F (36.7 C), Max:98.9 F (37.2 C)  Recent Labs  Lab 03/24/17 1304 03/31/2017 0730 03/26/17 0617 03/27/17 0631 03/27/17 1332 03/26/2017 0600 03/29/17 0632 03/29/17 1510 03/30/17 0552 03/30/17 0924  WBC  --  16.8* 17.6* 21.6* 22.5* 29.1* 31.2*  --  26.2*  --   CREATININE  --  1.32* 1.32* 1.30*  --  1.18 1.71*  --  2.20*  --   VANCOTROUGH  --  18  --   --   --   --   --  45*  --   --   VANCOPEAK 36  --   --   --   --   --   --   --   --   --   VANCORANDOM  --   --   --   --   --   --   --   --   --  36    Estimated Creatinine Clearance: 42.6 mL/min (A) (by  C-G formula based on SCr of 2.2 mg/dL (H)).    Allergies  Allergen Reactions  . Duloxetine     REACTION: memory dysfunction, dizziness  . Latex Other (See Comments)    Reaction unknown  . Levofloxacin Nausea Only  . Mercury Other (See Comments)    Reaction unknown  . Sulfonamide Derivatives Other (See Comments)    Reaction unknown    Antimicrobials this admission:  Vancomycin 1/19 >> Ceftriaxone 1/18 >> 1/21  Dose adjustments/levels this admission:  1/20 VT = 16 on 1750mg  q24h 1/21 VPk (1304 - 2.5 hrs after end of infusion) = 36 mcg/mL 1/22 VT 0730 = 18 mcg/mL 1/22 change vanco to 1500mg  q24h for high AUC 1/25 change vanco to 1750mg  q24h with improvement in renal function 12/26 change vanco to 1250mg  q24h with declining renal function (no doses given of this regimen) 12/26 [facility] MD checked random Vanc level ~ 18h after last dose = 45 mcg/mL, hold further doses 12/27 random Vanc level = 36 mcg/mL, continue to hold further doses  Microbiology results:  1/18 UCx: >100k MRSA 1/18 BCx: 4/4 bottles MRSA 1/20  repeat BCx: 2/2 peds bottles MRSA 1/22 MRSA PCR: positive 1/23 repeat BCx (4 bottles): NGTD 1/23 synovial fluid from ankle: rare MRSA, no anaerobes isolated thus far 1/25 L ankle wound: cx incubated for better growth 1/27 repeat UCx: ordered  Thank you for allowing pharmacy to be a part of this patient's care.   Greer PickerelJigna Julieth Tugman, PharmD, BCPS Pager: [phone] 03/30/2017 10:39 AM

## 2017-03-30 NOTE — Progress Notes (Signed)
Chief Complaint: Patient was seen in consultation today for biopsy/aspiration of complex right chest wall mass at the request of Dr. Kathlee Nations Trigt  Referring Physician(s): Dr. Kathlee Nations Trigt  Supervising Physician: Gilmer Mor  Patient Status: [facility] - In-pt  History of Present Illness: Kurt Reid is a 67 y.o. male with PMH HepC with cirrhosis, DM, bipolar, admitted on 2017/04/16 for abdominal pain, confusion; found to have complicated MRSA bacteremia apparently associated with a septic left ankle. His workup has also now found complex right anterio-lateral chest wall mass/process. Etiology unclear. Thoracic was consulted and requests IR do image guided tissue/fluid sampling to assess for both infectious and malignant sources. PMHx, meds, labs, imaging reviewed. Pt currently encephalopathic and jaundiced from cirrhosis. Wife at bedside.    Past Medical History:  Diagnosis Date  . ADHD 09/23/2006  . Allergic rhinitis   . Anxiety   . Benign prostatic hypertrophy   . Bipolar 1 disorder (HCC)   . Chronic pain syndrome 01/18/2009  . 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 Depression   . Diabetes mellitus without complication (HCC)   . Diverticulosis of colon   . ESOPHAGEAL VARICES 04/19/2009  . ETOH abuse    hx  . GERD 09/23/2006  . Hepatitis C   . Hyperlipidemia   . INSOMNIA-SLEEP DISORDER-UNSPEC 04/19/2009  . Low back pain    chronic  . Migraine   . MRSA (methicillin resistant staph aureus) culture positive   . NEPHROLITHIASIS, HX OF 11/02/2007  . Seizure disorder (HCC)    H/O  . Sinusitis    recurrent    Past Surgical History:  Procedure Laterality Date  . back surgury     x 5  . cheekbone     hx of fx  . I&D EXTREMITY Right 12/10/2013   Procedure: IRRIGATION AND DEBRIDEMENT OF MULTIPLE DOG BITES RIGHT HAND AND WRIST;  Surgeon: Dominica Severin, MD;  Location: [facility] OR;  Service: Orthopedics;  Laterality: Right;  . IR US GUIDE BX ASP/DRAIN   03/26/2017  . KNEE ARTHROSCOPY Left 03/29/2017   Procedure: IRRIGATION AND DEBRIDIMENT LEFT ANKLE;  Surgeon: Cammy Copa, MD;  Location: [facility] ORS;  Service: Orthopedics;  Laterality: Left;  . knee surgury     x 1  . s/p sinus surgury  2010   Dr. Zara Chess  . TONSILLECTOMY      Allergies: Duloxetine; Latex; Levofloxacin; Mercury; and Sulfonamide derivatives  Medications:  Current Facility-Administered Medications:  .  0.9 %  sodium chloride infusion, 250 mL, Intravenous, PRN, Emokpae, Courage, MD .  albuterol (PROVENTIL) (2.5 MG/3ML) 0.083% nebulizer solution 2.5 mg, 2.5 mg, Nebulization, Q2H PRN, Emokpae, Courage, MD .  aspirin EC tablet 81 mg, 81 mg, Oral, Daily, Emokpae, Courage, MD, 81 mg at 03/30/17 1023 .  furosemide (LASIX) injection 40 mg, 40 mg, Intravenous, Q12H, Rodolph Bong, MD .  gabapentin (NEURONTIN) capsule 600 mg, 600 mg, Oral, BID, Emokpae, Courage, MD, 600 mg at 03/30/17 1023 .  gadobenate dimeglumine (MULTIHANCE) injection 10 mL, 10 mL, Intravenous, Once PRN, August Saucer, Corrie Mckusick, MD .  guaiFENesin-dextromethorphan [facility] DM) 100-10 MG/5ML syrup 5 mL, 5 mL, Oral, Q4H PRN, Mariea Clonts, Courage, MD, 5 mL at 03/27/17 2232 .  insulin aspart (novoLOG) injection 0-15 Units, 0-15 Units, Subcutaneous, TID WC, Hanley Ben, Kshitiz, MD, 3 Units at 03/29/17 1849 .  insulin aspart (novoLOG) injection 0-5 Units, 0-5 Units, Subcutaneous, QHS, Alekh, Kshitiz, MD, 2 Units at 03/06/2017 2050 .  insulin detemir (LEVEMIR) injection  15 Units, 15 Units, Subcutaneous, QHS, Alekh, Kshitiz, MD, 15 Units at 03/29/17 2300 .  lactulose (CHRONULAC) 10 GM/15ML solution 30 g, 30 g, Oral, TID, Rodolph Bong, MD, 30 g at 03/30/17 1113 .  LORazepam (ATIVAN) injection 0.5 mg, 0.5 mg, Intravenous, Q6H PRN, Rodolph Bong, MD, 0.5 mg at 03/30/17 0943 .  metoCLOPramide (REGLAN) tablet 5-10 mg, 5-10 mg, Oral, Q8H PRN **OR** metoCLOPramide (REGLAN) injection 5-10 mg, 5-10 mg, Intravenous, Q8H  PRN, Cammy Copa, MD .  metoprolol tartrate (LOPRESSOR) injection 5 mg, 5 mg, Intravenous, Once PRN, Blount, Andi Devon T, NP .  multivitamin with minerals tablet 1 tablet, 1 tablet, Oral, Daily, Emokpae, Courage, MD, 1 tablet at 03/30/17 1023 .  ondansetron (ZOFRAN) tablet 4 mg, 4 mg, Oral, Q6H PRN **OR** ondansetron (ZOFRAN) injection 4 mg, 4 mg, Intravenous, Q6H PRN, Cammy Copa, MD, 4 mg at 03/29/17 1114 .  opium-belladonna (B&O SUPPRETTES) 16.2-60 MG suppository 1 suppository, 1 suppository, Rectal, Q6H PRN, Rodolph Bong, MD, 1 suppository at 03/29/17 1754 .  oxyCODONE (Oxy IR/ROXICODONE) immediate release tablet 10 mg, 10 mg, Oral, Q3H PRN, Rodolph Bong, MD, 10 mg at 03/30/17 1023 .  pantoprazole (PROTONIX) injection 40 mg, 40 mg, Intravenous, Q12H, Rodolph Bong, MD, 40 mg at 03/30/17 1023 .  propranolol (INDERAL) tablet 20 mg, 20 mg, Oral, BID, Emokpae, Courage, MD, 20 mg at 03/30/17 1025 .  rifaximin (XIFAXAN) tablet 550 mg, 550 mg, Oral, BID, Rodolph Bong, MD, 550 mg at 03/29/17 2300 .  sodium chloride flush (NS) 0.9 % injection 3 mL, 3 mL, Intravenous, Q12H, Emokpae, Courage, MD, 3 mL at 03/30/17 1040 .  sodium chloride flush (NS) 0.9 % injection 3 mL, 3 mL, Intravenous, PRN, Emokpae, Courage, MD .  tamsulosin (FLOMAX) capsule 0.4 mg, 0.4 mg, Oral, Daily, Rodolph Bong, MD, 0.4 mg at 03/30/17 1023 .  venlafaxine [facility]) tablet 150 mg, 150 mg, Oral, Q breakfast, Alekh, Kshitiz, MD, 150 mg at 03/29/17 1115 .  venlafaxine (EFFEXOR) tablet 75 mg, 75 mg, Oral, QHS, Alekh, Kshitiz, MD, 75 mg at 03/29/17 2300    Family History  Problem Relation Age of Onset  . Dementia Father   . Stroke Mother   . Hyperlipidemia Mother   . Depression Mother   . Hypertension Mother   . Asthma Daughter   . Diabetes Neg Hx     Social History   Socioeconomic History  . Marital status: Married    Spouse name: None  . Number of children: 3  . Years of education:  None  . Highest education level: None  Social Needs  . Financial resource strain: None  . Food insecurity - worry: None  . Food insecurity - inability: None  . Transportation needs - medical: None  . Transportation needs - non-medical: None  Occupational History  . Occupation: used to work for [employer], now disabled due to back pain    Employer: UNEMPLOYED  Tobacco Use  . Smoking status: Former Games developer  . Smokeless tobacco: Never Used  Substance and Sexual Activity  . Alcohol use: No  . Drug use: No  . Sexual activity: Not Currently  Other Topics Concern  . None  Social History Narrative  . None     Review of Systems: A 12 point ROS discussed and pertinent positives are indicated in the HPI above.  All other systems are negative.  Review of Systems  Vital Signs: BP 133/80 (BP Location: Right Arm)   Pulse Marland Kitchen)  109   Temp 98.8 F (37.1 C) (Oral)   Resp (!) 21   Ht 6\' 1"  (1.854 m)   Wt 244 lb 14.9 oz (111.1 kg)   SpO2 92%   BMI 32.31 kg/m   Physical Exam  Constitutional: He appears well-developed. He appears ill.  HENT:  Head: Normocephalic.  Mouth/Throat: Oropharynx is clear and moist.  Neck: No JVD present.  Cardiovascular: Normal rate, regular rhythm and normal heart sounds.  Pulmonary/Chest: Effort normal and breath sounds normal. No respiratory distress.  Abdominal: Soft. He exhibits distension. There is no tenderness.  Neurological:  Pt not arousable  Skin: Skin is warm.  Obvious jaundice      Imaging: Dg Chest 2 View  Result Date: 03/18/2017 CLINICAL DATA:  Confusion. EXAM: CHEST  2 VIEW COMPARISON:  03/10/2017 FINDINGS: The cardiac silhouette, mediastinal and hilar contours are within normal limits and stable. Stable tortuosity of the thoracic aorta. The lungs demonstrate mild chronic bronchitic changes but no infiltrates, edema or effusions. IMPRESSION: No acute cardiopulmonary findings. Electronically Signed   By: Rudie Meyer M.D.   On:  04/02/2017 15:59   Dg Ankle Complete Left  Result Date: 03/23/2017 CLINICAL DATA:  67 year old male with chronic left ankle pain EXAM: LEFT ANKLE COMPLETE - 3+ VIEW COMPARISON:  Recent prior radiographs of the left ankle 03/26/2017 FINDINGS: Diffuse soft tissue swelling about the ankle. Probable small ankle joint effusion. No significant interval change in the 2 days since the prior radiograph. No destructive bony changes. Mild tibiotalar and fibulotalar degenerative changes. IMPRESSION: 1. Persistent diffuse soft tissue swelling and small ankle joint effusion without evidence of fracture or malalignment. 2. Mild degenerative osteoarthritis again noted. No progressive or destructive bony changes at this time. Electronically Signed   By: Malachy Moan M.D.   On: 03/23/2017 19:05   Dg Ankle Complete Left  Result Date: 03/23/2017 CLINICAL DATA:  Swelling, pain and stiffness. EXAM: LEFT ANKLE COMPLETE - 3+ VIEW COMPARISON:  None. FINDINGS: There is no fracture or dislocation. Slight degenerative changes at the ankle joint with a small ankle effusion and soft tissue swelling around the ankle. IMPRESSION: 1. Slight arthritic changes at the ankle joint with a small joint effusion and soft tissue swelling. 2. No fractures. Electronically Signed   By: Francene Boyers M.D.   On: 03/17/2017 12:59   Ct Chest Wo Contrast  Result Date: 03/27/2017 CLINICAL DATA:  Hemoptysis, abdominal pain, confusion, cirrhosis secondary to hepatitis C, diabetes mellitus, leukocytosis, UTI, MRSA bacteremia EXAM: CT CHEST WITHOUT CONTRAST TECHNIQUE: Multidetector CT imaging of the chest was performed following the standard protocol without IV contrast. Sagittal and coronal MPR images reconstructed from axial data set. 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 Scattered atherosclerotic calcifications aorta and coronary arteries. No  pericardial effusion. Mediastinum/Nodes: Esophagus unremarkable. Scattered normal size mediastinal lymph nodes without thoracic adenopathy. Base of cervical region unremarkable. Lungs/Pleura: Dependent atelectasis RIGHT lower lobe. Area of opacity at the lateral aspect of the RIGHT upper lobe anteriorly, 4.8 x 4.5 x 3.0 cm. Minimal surrounding infiltrate. This process extends into the anterolateral RIGHT chest wall with destruction of the RIGHT third rib company by pathologic fracture. Additionally, the RIGHT upper lobe process is contiguous through the chest wall with a subpectoral gas and fluid collection external to the ribs in the anterolateral upper RIGHT chest, 10.3 x 3.2 cm in greatest axial dimensions and extending for 5 cm length. Bronchiectasis and atelectasis  at anterior base of RIGHT middle lobe. Remaining lungs clear. Upper Abdomen: Cirrhotic liver with multiple perigastric and perisplenic varices in the upper abdomen with a spontaneous splenorenal shunt better demonstrated on CT abdomen. Moderate ascites. Large area of developing low attenuation within the central spleen question splenic infarct versus developing splenic abscess new since 03/20/2017. Distended gallbladder. Musculoskeletal: No additional osseous findings. IMPRESSION: Consolidation at the anterolateral RIGHT upper lobe measuring 4.8 x 4.5 x 3.0 cm in size, contiguous with infiltrative process through the anterolateral RIGHT chest wall extending into the extra osseous soft tissues deep to the RIGHT pectoralis muscle where a gas and fluid collection is seen measuring 10.3 x 3.2 x 5.0 cm. Findings may either represent RIGHT upper lobe pneumonia with extension of infection through the chest wall associated with osteomyelitis of the anterolateral RIGHT third rib and accompanying chest wall abscess or RIGHT upper lobe tumor extending involving the RIGHT upper lobe and chest wall. The lack of RIGHT upper lobe opacity or RIGHT third rib  destruction on chest radiograph at presentation on 03/10/2017 makes infection (pneumonia with chest wall extension and subpectoral abscess) a more likely etiology than tumor. Cirrhotic liver with numerous varices in the upper abdomen and a new area of low attenuation within the central spleen which could represent an infarct or developing infection/abscess. Electronically Signed   By: Ulyses SouthwardMark  Boles M.D.   On: 03/27/2017 16:09   Ct Abdomen Pelvis W Contrast  Result Date: 03/24/2017 CLINICAL DATA:  67 year old male with history of abdominal pain, confusion and hip pain. Jaundice. EXAM: CT ABDOMEN AND PELVIS WITH CONTRAST TECHNIQUE: Multidetector CT imaging of the abdomen and pelvis was performed using the standard protocol following bolus administration of intravenous contrast. CONTRAST:  100mL ISOVUE-300 IOPAMIDOL (ISOVUE-300) INJECTION 61% COMPARISON:  No priors. FINDINGS: Lower chest: Areas of mild cylindrical bronchiectasis are noted in the lung bases, most evident in the right middle lobe where there is also extensive thickening of the peribronchovascular interstitium and associated peribronchovascular micro and macronodularity as well as regional architectural distortion, most compatible with areas of chronic mucoid impaction in the setting of chronic post infectious or inflammatory scarring. Hepatobiliary: Liver has a shrunken appearance and nodular contour, compatible with advanced cirrhosis. Contrast bolus is suboptimal on today's examination, and with this limitation in mind there is no discrete hypervascular lesion confidently identified. No intra or extrahepatic biliary ductal dilatation. Gallbladder is normal in appearance. Pancreas: No pancreatic mass. No pancreatic ductal dilatation. No pancreatic or peripancreatic fluid or inflammatory changes. Spleen: Spleen is enlarged measuring 18.0 x 8.4 x 17.1 cm (estimated splenic volume of 1,293 mL) . Adrenals/Urinary Tract: There are multiple nonobstructive  calculi throughout the collecting systems of both kidneys measuring up to 4 mm in the interpolar collecting system of the right kidney. No additional calculi are noted along the course of either ureter or within the lumen of the urinary bladder. No hydroureteronephrosis. Mild multifocal cortical thinning in the right kidney. 5.6 cm simple cyst in the interpolar region of the left kidney. No suspicious renal lesions. Urinary bladder is normal in appearance. Bilateral adrenal glands are normal in appearance. Stomach/Bowel: Normal appearance of the stomach. No pathologic dilatation of small bowel or colon. Numerous colonic diverticulae are noted, without surrounding inflammatory changes to suggest an acute diverticulitis at this time. Normal appendix. Vascular/Lymphatic: Aortic atherosclerosis, without evidence of aneurysm or dissection in the abdominal or pelvic vasculature. Numerous portosystemic collateral pathways, most evident for large splenorenal collaterals and small proximal gastric varices. Portal vein remains  patent at this time and is dilated measuring up to 18 mm in diameter in the porta hepatis. No lymphadenopathy. Reproductive: Prostate gland and seminal vesicles are unremarkable in appearance. Other: Small volume of ascites.  No pneumoperitoneum. Musculoskeletal: There are no aggressive appearing lytic or blastic lesions noted in the visualized portions of the skeleton. IMPRESSION: 1. Morphologic changes of advanced cirrhosis with evidence of portal hypertension, including dilated portal vein and splenomegaly with numerous portosystemic collateral pathways. Previously identified lesion in segment 8 is not confidently noted on today's examination. 2. Numerous nonobstructive calculi within the collecting systems of both kidneys. No ureteral stones or findings of urinary tract obstruction are noted at this time. 3. Aortic atherosclerosis. 4. Colonic diverticulosis without evidence of acute diverticulitis  at this time. Electronically Signed   By: Trudie Reed M.D.   On: 03/17/2017 14:08   US Renal  Result Date: 03/30/2017 CLINICAL DATA:  Acute renal failure EXAM: RENAL / URINARY TRACT ULTRASOUND COMPLETE COMPARISON:  03/07/2017. FINDINGS: Right Kidney: Length: 11.3 cm. Scattered nonobstructing renal calculi are noted similar to that seen on prior CT examination. Left Kidney: Length: 11.3 cm. Scattered nonobstructing renal stones are noted. Known cystic lesion seen on recent CT is not as well appreciated on today's exam. Bladder: Not well distended. IMPRESSION: Bilateral nonobstructing renal stones similar to that seen on prior CT. Previously noted left renal cyst is not well appreciated on this exam. Electronically Signed   By: Alcide Clever M.D.   On: 03/30/2017 10:30   Mr Ankle Left Wo Contrast  Result Date: 03/24/2017 CLINICAL DATA:  Ankle swelling.  Poor historian. EXAM: MRI OF THE LEFT ANKLE WITHOUT CONTRAST TECHNIQUE: Multiplanar, multisequence MR imaging of the ankle was performed. No intravenous contrast was administered. COMPARISON:  None. FINDINGS: Patient refused to complete the exam per technologist report. Contrast-enhanced images with unable to be acquired. TENDONS Peroneal: Intact peroneus longus and peroneus brevis tendons. Posteromedial: Intact tibialis posterior, flexor hallucis longus and flexor digitorum longus tendons. Mild-to-moderate amount of fluid along the flexor digitorum longus tendons consistent with tenosynovitis. Anterior: Intact tibialis anterior, extensor hallucis longus and extensor digitorum longus tendons. Achilles: Intact. Plantar Fascia: Intact. LIGAMENTS Lateral: Intact. Medial: Intact. CARTILAGE Ankle Joint: Small joint effusion. Cartilaginous thinning of the ankle joint. Subtalar Joints/Sinus Tarsi: No joint effusion or chondral defect. Bones: Mild marrow edema of the medial and lateral malleoli. No joint dislocation or suspicious osseous lesions. Mild degenerative  sub chondral cystic change and edema of the talar dome. Other: Moderate diffuse soft tissue swelling of the included leg, ankle and dorsum of the mid and included forefoot. IMPRESSION: 1. There is moderate diffuse soft tissue swelling of the included distal leg, ankle and dorsum of the mid and included forefoot. Findings are nonspecific but can be seen in third spacing of fluid, venous stasis or possibly cellulitis. No abscess collections. 2. Nonspecific mild marrow edema of the mediolateral malleoli possibly posttraumatic in etiology. Fractures are not conclusively identified. No joint dislocation is seen. 3. 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 4. Tenosynovitis of the flexor digitorum longus Electronically Signed   By: Tollie Eth M.D.   On: 03/24/2017 14:16   Mr Hip Right Wo Contrast  Result Date: 03/22/2017 CLINICAL DATA:  Bilateral hip pain. Recent fall. Age-indeterminate fracture of the right greater trochanter on radiographs. EXAM: MR OF THE RIGHT HIP WITHOUT CONTRAST TECHNIQUE: Multiplanar, multisequence MR imaging was performed. No intravenous contrast was administered. COMPARISON:  Pelvic CT 03/12/2017, hip radiographs 03/19/2017  and right hip CT 01/28/2015. FINDINGS: Despite efforts by the technologist and patient, mild motion artifact is present on today's exam and could not be eliminated. This reduces exam sensitivity and specificity. Bones: No evidence of acute fracture, dislocation or femoral head avascular necrosis. There is a chronic fracture of the right femoral greater trochanter which is posteriorly displaced without healing (nonunion). This was demonstrated as an acute injury on the right hip CT 01/28/2015. Lower lumbar spondylosis noted. The sacroiliac joints appear normal. Articular cartilage and labrum Articular cartilage:  Mild degenerative changes of both hips. Labrum:  No gross labral tear or paralabral cyst. Joint or bursal effusion Joint effusion:   No significant hip joint effusion. Bursae: No focal periarticular fluid collections are identified. The right hip periarticular soft tissues appear unremarkable. However, on the left, there is prominent soft tissue edema surrounding the hip, greatest posteriorly. This tracks along the gemellus and obturator internus muscles. No focal fluid collection identified. Muscles and tendons Muscles and tendons: As above, there is periarticular soft tissue edema surrounding the left hip, involving gemellus and obturator internus muscles. There is a component along the medial pelvic side wall. There is also some edema within the gluteus musculature. The left common hamstring tendon demonstrates tendinosis and partial tearing. No significant right hip tendon abnormalities are seen. The right piriformis muscle is chronically atrophied. Other findings Miscellaneous:  Mild ascites noted. IMPRESSION: 1. 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 This fracture was demonstrated as an acute injury in 2016. 2. No acute osseous findings identified. No significant hip or sacroiliac joint effusion. 3. Soft tissue edema surrounding the left hip, likely posttraumatic or potentially inflammatory. No focal abscess. The left common hamstring tendon appears partially torn, and this could be the etiology. Electronically Signed   By: Carey Bullocks M.D.   On: 03/22/2017 11:53   Ir US Guide Bx Asp/drain  Result Date: 03/26/2017 INDICATION: 67 year old with left ankle swelling. Left ankle effusion identified on MRI. EXAM: ULTRASOUND-GUIDED ASPIRATION OF LEFT ANKLE JOINT EFFUSION MEDICATIONS: None ANESTHESIA/SEDATION: None COMPLICATIONS: None immediate. PROCEDURE: Patient is confused and unable to give consent. Consent was obtained for the patient's wife via the telephone. The left ankle was evaluated with ultrasound. Ankle effusion was identified along the anterolateral aspect of the ankle. The skin was prepped with  chlorhexidine. A sterile field was created. Skin was anesthetized with 1% lidocaine. Using ultrasound guidance, 18 gauge spinal needle was directed into the joint effusion. Initially 3 mL thick yellow fluid was aspirated. 2 mL of additional bloody fluid was removed. Fluid was sent for analysis. Bandage placed over the puncture site. FINDINGS: Effusion along the anterolateral aspect of the ankle joint. 5 mL of fluid was removed. IMPRESSION: Successful ultrasound-guided aspiration of the left ankle joint effusion. Electronically Signed   By: Richarda Overlie M.D.   On: 03/26/2017 14:51   Dg Chest Port 1 View  Result Date: 03/27/2017 CLINICAL DATA:  Subcutaneous emphysema.  Hemoptysis. EXAM: PORTABLE CHEST 1 VIEW COMPARISON:  03/25/2017 FINDINGS: The cardiomediastinal silhouette is unchanged. Heart size is within normal limits. Thoracic aorta is tortuous. Chronic bronchitic changes are again noted. There is new hazy airspace opacity in the lateral right mid to upper lung. The left lung remains clear. No sizable pleural effusion or pneumothorax is identified. There is persistent mild elevation of the right hemidiaphragm. IMPRESSION: New right mid upper lung airspace opacity which may reflect pneumonia or hemorrhage. Electronically Signed   By: Sebastian Ache M.D.   On: 03/27/2017  12:15   Dg Hips Bilat W Or Wo Pelvis 3-4 Views  Result Date: 03/16/2017 CLINICAL DATA:  Suspected hip pain with decreased motility. EXAM: DG HIP (WITH OR WITHOUT PELVIS) 3-4V BILAT COMPARISON:  None. FINDINGS: There is an age-indeterminate avulsion fracture of the right greater trochanter. Otherwise, there is no evidence of fracture or dislocation. Minimal osteoarthritic changes of the left hip. IMPRESSION: Age-indeterminate avulsion fracture of the right greater trochanter. No evidence of left hip fracture. Electronically Signed   By: Ted Mcalpine M.D.   On: 03/10/2017 12:58    Labs:  CBC: Recent Labs    03/27/17 1332  03/11/2017 0600 03/29/17 0632 03/30/17 0552  WBC 22.5* 29.1* 31.2* 26.2*  HGB 12.7* 12.5* 12.7* 12.4*  HCT 37.2* 37.2* 37.5* 36.5*  PLT 170 160 217 182    COAGS: Recent Labs    05/22/16 1559 11/20/16 1412 03/30/17 0924  INR 1.2* 1.3* 1.71    BMP: Recent Labs    03/27/17 0631 03/13/2017 0600 03/29/17 0632 03/30/17 0552 03/30/17 0924  NA 134* 136 131* 137  --   K 4.8 5.0 5.2* 5.5* 6.0*  CL 109 108 107 113*  --   CO2 20* 21* 17* 17*  --   GLUCOSE 174* 150* 160* 154*  --   BUN 48* 52* 62* 74*  --   CALCIUM 8.0* 8.0* 7.6* 7.8*  --   CREATININE 1.30* 1.18 1.71* 2.20*  --   GFRNONAA 55* >60 40* 29*  --   GFRAA >60 >60 46* 34*  --     LIVER FUNCTION TESTS: Recent Labs    03/26/17 0617 03/27/17 0631 03/15/2017 0600 03/30/17 0552  BILITOT 5.1* 8.5* 9.2* 11.7*  AST 189* 116* 123* 152*  ALT 64* 54 55 60  ALKPHOS 336* 320* 296* 252*  PROT 6.5 6.5 6.5 6.8  ALBUMIN 1.7* 1.7* 1.5* 1.6*    TUMOR MARKERS: No results for input(s): AFPTM, CEA, CA199, CHROMGRNA in the last 8760 hours.  Assessment and Plan: Complex right chest wall mass/collection. Reviewed with Dr. Loreta Ave, plan for image guided aspiration/drainage as well as biopsy of this tomorrow. NPO p MN Request for paracentesis. Can be performed while pt in dept for bx tomorrow. Labs reviewed. Elevated INR at 1.7 secondary to liver dysfunction. Recheck in am, but not likely to improve. Risks and benefits discussed with the patient including, but not limited to bleeding, infection, damage to adjacent structures or low yield requiring additional tests. All of the patient's questions were answered, patient is agreeable to proceed. Consent signed and in chart.    Thank you for this interesting consult.  I greatly enjoyed meeting MASAHIRO IGLESIA and look forward to participating in their care.  A copy of this report was sent to the requesting provider on this date.  Electronically Signed: Brayton El, PA-C 03/30/2017,  1:43 PM   I spent a total of 20 minutes in face to face in clinical consultation, greater than 50% of which was counseling/coordinating care for right chest wall mass biopsy/aspiration

## 2017-03-30 NOTE — Progress Notes (Signed)
Transferred pt to stepdown per Dr Lamona CurlHomsons order. Report called to Nurse on 2W. Pt transferrred with 2L 02. IV in right hand. Wife at bedside. Pt transferred to 1224,

## 2017-03-30 NOTE — Progress Notes (Addendum)
Progress Note   Subjective   Dr. Grandville Silos asked [place] to reevaluate worsening LFTs. See full consult note from 1/24. Since then MRSA bacteremia was determined to be from left ankle septic arthritis. A new right lung abscess and osteo/chest wall abscess were also found. T bili has been steadily rising from 4.1 to 11.7. Alk phos remains stable but elevated. AST has slightly increased. Cr has been rising, now to 2.2 today. Pt is confused and drowsy. Pts wife in room.       Objective  Vital signs in last 24 hours: Temp:  [97.4 F (36.3 C)-98.9 F (37.2 C)] 97.4 F (36.3 C) (01/27 1422) Pulse Rate:  [95-111] 109 (01/27 1333) Resp:  [17-21] 21 (01/27 1000) BP: (125-151)/(63-93) 144/63 (01/27 1422) SpO2:  [89 %-95 %] 92 % (01/27 1333) Weight:  [239 lb 13.8 oz (108.8 kg)-244 lb 14.9 oz (111.1 kg)] 239 lb 13.8 oz (108.8 kg) (01/27 1422) Last BM Date: 03/29/17  General: lethargic, well-developed, in NAD Heart:  Regular rate and rhythm; no murmurs Chest: Clear to ascultation bilaterally Abdomen:  Soft, nontender and nondistended. Normal bowel sounds, without guarding, and without rebound.   Extremities:  Without edema. Neurologic:  Confused, lethargic   Intake/Output from previous day: 01/26 0701 - 01/27 0700 In: 1188 [P.O.:240; I.V.:948] Out: 300 [Urine:300] Intake/Output this shift: No intake/output data recorded.  Lab Results: Recent Labs    03/23/2017 0600 03/29/17 0632 03/30/17 0552  WBC 29.1* 31.2* 26.2*  HGB 12.5* 12.7* 12.4*  HCT 37.2* 37.5* 36.5*  PLT 160 217 182   BMET Recent Labs    03/15/2017 0600 03/29/17 0632 03/30/17 0552 03/30/17 0924  NA 136 131* 137  --   K 5.0 5.2* 5.5* 6.0*  CL 108 107 113*  --   CO2 21* 17* 17*  --   GLUCOSE 150* 160* 154*  --   BUN 52* 62* 74*  --   CREATININE 1.18 1.71* 2.20*  --   CALCIUM 8.0* 7.6* 7.8*  --    LFT Recent Labs    03/30/17 0552  PROT 6.8  ALBUMIN 1.6*  AST 152*  ALT 60  ALKPHOS 252*  BILITOT 11.7*    BILIDIR 6.6*  IBILI 5.1*   PT/INR Recent Labs    03/30/17 0924  LABPROT 19.9*  INR 1.71    Studies/Results: US Renal  Result Date: 03/30/2017 CLINICAL DATA:  Acute renal failure EXAM: RENAL / URINARY TRACT ULTRASOUND COMPLETE COMPARISON:  03/23/2017. FINDINGS: Right Kidney: Length: 11.3 cm. Scattered nonobstructing renal calculi are noted similar to that seen on prior CT examination. Left Kidney: Length: 11.3 cm. Scattered nonobstructing renal stones are noted. Known cystic lesion seen on recent CT is not as well appreciated on today's exam. Bladder: Not well distended. IMPRESSION: Bilateral nonobstructing renal stones similar to that seen on prior CT. Previously noted left renal cyst is not well appreciated on this exam. Electronically Signed   By: Inez Catalina M.D.   On: 03/30/2017 10:30     Assessment & Plan   1. 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 Given his severe illnesses further hepatic decompenstation is not unexpected. He has little hepatic reserve. Some of the t bili elevation may be due to reduced renal clearance with worsening renal function. Supportive mgmt. Check ammonia. Trend LFTs. Primary service to consider renal  consult.     LOS: 9 days   Brenden Rudman T. Fuller Plan MD 03/30/2017, 3:15 PM

## 2017-03-30 NOTE — Progress Notes (Signed)
Patient pulled foley out during day shift per day shift nurse. Family does not want reinserted at this time. Waiting to see if patient voids. Patient still very lethargic but awakens to verbal stimuli. Will continue to monitor.

## 2017-03-30 NOTE — Progress Notes (Signed)
PROGRESS NOTE    Kurt Reid  ZOX:[id number] DOB: [birth year] DOA: 03/15/2017 PCP: [name], MD    Brief Narrative:  67 year old male with history of liver cirrhosis secondary to hepatitis C, diabetes, bipolar disorder sent by PCP for evaluation of abdominal pain and confusion.  Patient was admitted with leukocytosis probably from UTI and started on antibiotics.  He was found to have MRSA bacteremia.  ID was consulted.  Patient also noted to have a septic arthritis status post irrigation and debridement per orthopedics.  Cultures growing MRSA.  Patient also noted to have a pulmonary process invading through the rib concerning for malignant process versus abscess formation.  Cardiothoracic surgery consulted.     Assessment & Plan:   Principal Problem:   Delirium due to another medical condition Active Problems:   BIPOLAR AFFECTIVE DISORDER   Chronic pain syndrome   Esophageal varices (HCC)   Hepatic cirrhosis due to chronic hepatitis C infection (HCC)   Hemoptysis   DM type 2 without retinopathy (HCC)   Chronic hepatitis C (HCC)   Leukocytosis   Sepsis (HCC)   Altered mental status   UTI (urinary tract infection)   Ankle pain, left   Left ankle effusion   Hematemesis   Acute urinary retention   Staphylococcal arthritis of left ankle (HCC)   Sepsis due to methicillin resistant Staphylococcus aureus (HCC)   Abscess of upper lobe of right lung without pneumonia (HCC)   Hyperkalemia   Hyperbilirubinemia  MRSA bacteremia -Questionable cause.  May have seeded from ankle. Repeat blood cultures from 03/23/2017 positive.  Blood cultures from 03/26/2017 still negative. ID following.  2D echo negative for vegetation.  Patient was to have a TEE on 03/17/2017 however patient refused.  Patient has been seen by psychiatry and lacks capacity at this time.  RN spoke with family who will be fine signing consent for TEE.  Cardiology was consulted to see if TEE could be rearranged.  Spoke  with the card master who I discussed with cardiology who feel patient is too high risk with history of esophageal varices for TEE to be done and recommended empiric treatment for endocarditis.  She will likely need 6-8 weeks of IV antibiotics or per ID recommendations.  Patient status post left ankle aspiration per interventional radiology with cultures positive for MRSA.  Patient status post irrigation and debridement per Dr. August Saucer 2017-04-23.  Patient with a worsening renal function and as such IV vancomycin has been discontinued.  Will defer choice of IV antibiotics to ID.   Probable acute metabolic encephalopathy from bacteremia/UTI/mild hepatic encephalopathy causing altered mental status -Patient still confused and drowsy.  Likely multifactorial secondary to acute illness/bacteremia, septic arthritis, pulmonary process and probable hepatic encephalopathy.  Patient with worsening bilirubin levels.  Patient also with some abdominal distention.  Continue lactulose 3 times daily and Xifaxan.  IV antibiotics.  Will place on IV Lasix.  Follow.   MRSAUTI -Urine cultures positive for MRSA.  IV vancomycin was discontinued yesterday due to worsening renal function.  Creatinine climbing and as such will likely need to change IV antibiotics.  Will defer to ID.    Leukocytosis -Probably secondary to  MRSA bacteremia/UTI.  Leukocytosis trending back down.  Patient with right upper lobe mass with infiltrate into rib and muscle concerning for possible abscess which may be contributing to patient's worsening leukocytosis.  Currently afebrile.  2D echo negative for vegetations.  TEE discussed with cardiology who feel patient is too high risk for TEE  and recommending empiric treatment for endocarditis.  IV vancomycin has been held due to worsening renal function.  Will defer antibiotics to ID.   Decompensated liver Cirrhosiswith portal hypertension and mild hepatic encephalopathy/abdominal distention probable  ascites -Patient now with distended abdomen and worsening renal function.  Patient also noted to have edema in thighs and lower abdomen.  Patient also jaundiced.  Bilirubin levels of increased and currently at 11.7.  Patient still with confusion. -Increased lactulose to 3 times daily.  Continue Xifaxan.  Continue propranolol.. -CT abdomen showed only small ascites so doubt that patient has SBP.  -AST rising.  Bilirubin rising.  Placed on Lasix 40 mg IV every 12 hours.  Hold spironolactone due to hyperkalemia.  Due to worsening LFTs and elevated bilirubin level will consult with GI for further evaluation and management. -Patient with abdominal distention and likely ascites. -IR for ultrasound-guided paracentesis with labs.  We will give albumin post paracentesis.  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  No surgical intervention as per orthopedics. -fall precautions -PT evaluation -Per orthopedics.  Left ankle effusion/MRSA Septic arthritis of left ankle Concern for septic arthritis.  Patient seen by orthopedics.  Patient status post aspiration of left ankle effusion per interventional radiology 03/26/2017.  Cultures consistent with MRSA.  Patient is status post left ankle irrigation and debridement per Dr. August Saucer 03/22/2017 and cultures sent which are pending.  Continue IV vancomycin.  ID and orthopedics following.   Hyponatremia -Probably from dehydration.  Improved.  Follow.  Thrombocytopenia -Likely secondary to cirrhosis.  Improved.   Diabetes mellitus Hemoglobin A1c 8.7.  CBGs have ranged from 52-191.  Continue current regimen of Levemir 15 units daily and sliding scale insulin.  Patient with poor oral intake and as such meal coverage insulin has been discontinued.    Hemoptysis Per RN  patient noted to have some hemoptysis or questionable hematemesis the night of 03/26/2017.  Patient with no hemoptysis noted today.  No tongue lacerations or lip lacerations.  Patient with history of cirrhosis and esophageal varices and as such GI consultation was obtained.  Patient was seen in consultation by Dr. Russella Dar, gastroenterology who feels patient symptoms are more from hemoptysis as opposed to hematemesis.  GI recommended pulmonary evaluation.  Chest x-ray has been ordered this morning which shows a new right mid upper lung airspace opacity which may reflect pneumonia or hemorrhage.  Spoke with Dr. Vassie Loll of pulmonary who had recommended CT chest which was done which showed a right pictorial abscess and right upper lobe masslike opacity eroding into rib/muscle with concern for possible septic emboli.  Pulmonary recommended monitoring of hemoptysis and following H&H.  General surgery evaluated right upper lobe masslike lesion and recommended CVTS evaluation.    Right upper lobe masslike lesion--infiltrates into rib/muscle/right upper lobe infiltrate Noted on CT chest.  Patient with persistent leukocytosis and clinical deterioration.  Patient seen in consultation by pulmonary as well as general surgery.  General surgery assessed patient evaluated CT scan and recommended evaluation by cardiothoracic surgery for further management.  Cardiothoracic surgery who feel abnormality noted on CT scan that this might be a malignant process which may be superinfected.  Cardiothoracic surgery has asked IR to perform a core biopsy of the right chest wall mass initially and pending results will be determined whether surgical debridement is necessary.  Appreciate cardiothoracic input  and recommendations.    Increasing creatinine/acute renal failure Patient now with worsening renal function with creatinine now up to 2.20.  Questionable etiology.  Differential includes hepatorenal syndrome versus volume overload versus  drug-induced.  Patient noted to have some edema in thighs and lower abdominal region.  Abdomen is tight likely with ascites.  Saline lock IV fluids.  IV vancomycin was discontinued as of 03/29/2017.  Check a UA with cultures and sensitivities.  Check a fractional excretion of sodium.  Random vancomycin level was 36.  Placed on Lasix 40 mg IV every 12 hours.  Strict I's and O's.  Daily weights.  Consult with nephrology for further evaluation and manage.  Follow.   Acute urinary retention Placed Foley catheter however patient pulled it out yesterday 03/29/2017.  Flomax 0.4 mg daily.  Will need a voiding trial in about 5 days only to follow-up with urology in the outpatient setting.  Hyperkalemia Likely secondary to acute renal failure.  Repeat potassium was 6.  Patient given Kayexalate.  Patient placed on IV diuretics.  Repeat potassium levels.      DVT prophylaxis: SCDs Code Status: Full Family Communication: Updated wife at bedside.  Disposition Plan: Transfer to stepdown unit.     Consultants:   Infectious disease: Dr. Drue Second 03/22/2017  Orthopedics Dr. August Saucer 03/23/2017  Psychiatry: Dr. Sharma Covert 03/24/2017  Pulmonary : Dr. Vassie Loll 03/22/2017  Gastroenterology: Dr. Russella Dar 03/27/2017  General surgery: Dr. Johna Sheriff 03/29/2017  Cardiothoracic surgery: Dr. Zenaida Niece trigt 03/29/2017  Procedures:   CT abdomen and pelvis 03/09/2017  Plain films of the ankle 03/11/2017, 03/23/2017  Chest x-ray 03/27/2017, 03/27/2017  MRI left ankle 03/24/2017  MRI right hip 03/22/2017  Ultrasound-guided aspiration left ankle joint effusion Per Dr. Lowella Dandy interventional radiology 03/26/2017  Plain films of bilateral hips with pelvis 03/20/2017  2D echo 03/22/2017   CT chest 03/27/2017  Irrigation and debridement of left ankle per Dr. August Saucer 03/06/2017  Antimicrobials:   IV vancomycin 03/22/2017>>>>>  IV Rocephin 03/05/2017>>>>> 03/24/2017   Subjective: Patient drowsy however easily arousable.  Patient denies any  chest pain or shortness of breath.  Wife at bedside.   Objective: Vitals:   03/30/17 0543 03/30/17 0600 03/30/17 1000 03/30/17 1333  BP: (!) 149/93  125/69 133/80  Pulse: (!) 111  (!) 103 (!) 109  Resp: 18  (!) 21   Temp: 98.9 F (37.2 C)  98.6 F (37 C) 98.8 F (37.1 C)  TempSrc: Axillary  Oral Oral  SpO2: (!) 89% 95% 94% 92%  Weight: 111.1 kg (244 lb 14.9 oz)     Height:        Intake/Output Summary (Last 24 hours) at 03/30/2017 1335 Last data filed at 03/30/2017 1610 Gross per 24 hour  Intake 948 ml  Output -  Net 948 ml   Filed Weights   03/27/17 0548 03/08/2017 0434 03/30/17 0543  Weight: 107.2 kg (236 lb 5.3 oz) 105.8 kg (233 lb 4 oz) 111.1 kg (244 lb 14.9 oz)    Examination:  General exam: Drowsy.  Jaundiced. Respiratory system: Some bibasilar crackles otherwise clear.  No wheezing.   Some crepitus right chest wall area on the lateral area, which is raised.  Respiratory effort normal. Cardiovascular system: Regular rate and rhythm no murmurs rubs or gallops.  No JVD.  trace+ bilateral lower extremity edema.  2+ edema in upper thighs and hips as well as lower abdomen. Gastrointestinal system: Abdomen is tight, distended, positive bowel sounds.  Central nervous system: Drowsy however easily arousable.  Answering  questions.  Moving extremities spontaneously.   Extremities: Symmetric 5 x 5 power.  Left ankle wrapped. Skin: No rashes, lesions or ulcers Psychiatry: Unable to assess at this time.. Mood & affect appropriate.     Data Reviewed: I have personally reviewed following labs and imaging studies  CBC: Recent Labs  Lab 03/26/17 0617 03/27/17 0631 03/27/17 1332 04/20/17 0600 03/29/17 0632 03/30/17 0552  WBC 17.6* 21.6* 22.5* 29.1* 31.2* 26.2*  NEUTROABS 13.7* 17.8*  --  25.9* 27.1* 21.7*  HGB 12.2* 12.5* 12.7* 12.5* 12.7* 12.4*  HCT 36.3* 36.7* 37.2* 37.2* 37.5* 36.5*  MCV 90.3 89.5 90.1 90.7 90.8 91.5  PLT 148* 159 170 160 217 182   Basic Metabolic  Panel: Recent Labs  Lab 03/24/17 0553 03/09/2017 0730 03/26/17 0617 03/27/17 0631 04/20/17 0600 03/29/17 0632 03/30/17 0552 03/30/17 0924  NA 131* 133* 134* 134* 136 131* 137  --   K 4.3 4.5 4.3 4.8 5.0 5.2* 5.5* 6.0*  CL 107 108 107 109 108 107 113*  --   CO2 18* 21* 22 20* 21* 17* 17*  --   GLUCOSE 265* 276* 171* 174* 150* 160* 154*  --   BUN 42* 44* 42* 48* 52* 62* 74*  --   CREATININE 1.50* 1.32* 1.32* 1.30* 1.18 1.71* 2.20*  --   CALCIUM 8.0* 8.1* 8.1* 8.0* 8.0* 7.6* 7.8*  --   MG 2.1 2.2 2.2  --   --   --   --   --    GFR: Estimated Creatinine Clearance: 42.6 mL/min (A) (by C-G formula based on SCr of 2.2 mg/dL (H)). Liver Function Tests: Recent Labs  Lab 03/21/2017 0730 03/26/17 0617 03/27/17 0631 Apr 20, 2017 0600 03/30/17 0552  AST 72* 189* 116* 123* 152*  ALT 42 64* 54 55 60  ALKPHOS 278* 336* 320* 296* 252*  BILITOT 4.1* 5.1* 8.5* 9.2* 11.7*  PROT 6.2* 6.5 6.5 6.5 6.8  ALBUMIN 1.7* 1.7* 1.7* 1.5* 1.6*   No results for input(s): LIPASE, AMYLASE in the last 168 hours. No results for input(s): AMMONIA in the last 168 hours. Coagulation Profile: Recent Labs  Lab 03/30/17 0924  INR 1.71   Cardiac Enzymes: No results for input(s): CKTOTAL, CKMB, CKMBINDEX, TROPONINI in the last 168 hours. BNP (last 3 results) No results for input(s): PROBNP in the last 8760 hours. HbA1C: No results for input(s): HGBA1C in the last 72 hours. CBG: Recent Labs  Lab 03/29/17 1655 03/29/17 2203 03/30/17 0739 03/30/17 0850 03/30/17 1116  GLUCAP 163* 132* 52* 187* 191*   Lipid Profile: No results for input(s): CHOL, HDL, LDLCALC, TRIG, CHOLHDL, LDLDIRECT in the last 72 hours. Thyroid Function Tests: No results for input(s): TSH, T4TOTAL, FREET4, T3FREE, THYROIDAB in the last 72 hours. Anemia Panel: No results for input(s): VITAMINB12, FOLATE, FERRITIN, TIBC, IRON, RETICCTPCT in the last 72 hours. Sepsis Labs: No results for input(s): PROCALCITON, LATICACIDVEN in the last 168  hours.  Recent Results (from the past 240 hour(s))  Culture, blood (routine x 2)     Status: Abnormal   Collection Time: 03/23/2017 11:31 AM  Result Value Ref Range Status   Specimen Description BLOOD BLOOD RIGHT FOREARM  Final   Special Requests   Final    BOTTLES DRAWN AEROBIC AND ANAEROBIC Blood Culture adequate volume   Culture  Setup Time   Final    GRAM POSITIVE COCCI IN BOTH AEROBIC AND ANAEROBIC BOTTLES CRITICAL RESULT CALLED TO, READ BACK BY AND VERIFIED WITHPeggyann Juba PHARMD [id number] 03/22/17 A BROWNING  Performed at [facility], [address], [place], [place] [address]    Culture METHICILLIN RESISTANT STAPHYLOCOCCUS AUREUS (A)  Final   Report Status 03/24/2017 FINAL  Final   Organism ID, Bacteria METHICILLIN RESISTANT STAPHYLOCOCCUS AUREUS  Final      Susceptibility   Methicillin resistant staphylococcus aureus - MIC*    CIPROFLOXACIN <=0.5 SENSITIVE Sensitive     ERYTHROMYCIN >=8 RESISTANT Resistant     GENTAMICIN <=0.5 SENSITIVE Sensitive     OXACILLIN RESISTANT Resistant     TETRACYCLINE <=1 SENSITIVE Sensitive     VANCOMYCIN <=0.5 SENSITIVE Sensitive     TRIMETH/SULFA <=10 SENSITIVE Sensitive     CLINDAMYCIN <=0.25 SENSITIVE Sensitive     RIFAMPIN <=0.5 SENSITIVE Sensitive     Inducible Clindamycin NEGATIVE Sensitive     * METHICILLIN RESISTANT STAPHYLOCOCCUS AUREUS  Blood Culture ID Panel (Reflexed)     Status: Abnormal   Collection Time: 03/23/2017 11:31 AM  Result Value Ref Range Status   Enterococcus species NOT DETECTED NOT DETECTED Final   Listeria monocytogenes NOT DETECTED NOT DETECTED Final   Staphylococcus species DETECTED (A) NOT DETECTED Final    Comment: CRITICAL RESULT CALLED TO, READ BACK BY AND VERIFIED WITHPeggyann Juba PHARMD [id number] 03/22/17 A BROWNING    Staphylococcus aureus DETECTED (A) NOT DETECTED Final    Comment: Methicillin (oxacillin)-resistant Staphylococcus aureus (MRSA). MRSA is predictably resistant to beta-lactam antibiotics (except  ceftaroline). Preferred therapy is vancomycin unless clinically contraindicated. Patient requires contact precautions if  hospitalized. CRITICAL RESULT CALLED TO, READ BACK BY AND VERIFIED WITH: Peggyann Juba PHARMD [id number] 03/22/17 A BROWNING    Methicillin resistance DETECTED (A) NOT DETECTED Final    Comment: CRITICAL RESULT CALLED TO, READ BACK BY AND VERIFIED WITH: Peggyann Juba PHARMD [id number] 03/22/17 A BROWNING    Streptococcus species NOT DETECTED NOT DETECTED Final   Streptococcus agalactiae NOT DETECTED NOT DETECTED Final   Streptococcus pneumoniae NOT DETECTED NOT DETECTED Final   Streptococcus pyogenes NOT DETECTED NOT DETECTED Final   Acinetobacter baumannii NOT DETECTED NOT DETECTED Final   Enterobacteriaceae species NOT DETECTED NOT DETECTED Final   Enterobacter cloacae complex NOT DETECTED NOT DETECTED Final   Escherichia coli NOT DETECTED NOT DETECTED Final   Klebsiella oxytoca NOT DETECTED NOT DETECTED Final   Klebsiella pneumoniae NOT DETECTED NOT DETECTED Final   Proteus species NOT DETECTED NOT DETECTED Final   Serratia marcescens NOT DETECTED NOT DETECTED Final   Haemophilus influenzae NOT DETECTED NOT DETECTED Final   Neisseria meningitidis NOT DETECTED NOT DETECTED Final   Pseudomonas aeruginosa NOT DETECTED NOT DETECTED Final   Candida albicans NOT DETECTED NOT DETECTED Final   Candida glabrata NOT DETECTED NOT DETECTED Final   Candida krusei NOT DETECTED NOT DETECTED Final   Candida parapsilosis NOT DETECTED NOT DETECTED Final   Candida tropicalis NOT DETECTED NOT DETECTED Final    Comment: Performed at [facility], [address] [place], [place] [address]  Culture, blood (routine x 2)     Status: Abnormal   Collection Time: 03/28/2017  1:07 PM  Result Value Ref Range Status   Specimen Description BLOOD BLOOD LEFT FOREARM  Final   Special Requests   Final    BOTTLES DRAWN AEROBIC AND ANAEROBIC Blood Culture adequate volume   Culture  Setup Time   Final    GRAM  POSITIVE COCCI IN BOTH AEROBIC AND ANAEROBIC BOTTLES CRITICAL RESULT CALLED TO, READ BACK BY AND VERIFIED WITH: Oneta Rack, AT [phone] 03/22/17  BY D. VANHOOK    Culture (A)  Final    STAPHYLOCOCCUS AUREUS SUSCEPTIBILITIES PERFORMED ON PREVIOUS CULTURE WITHIN THE LAST 5 DAYS. Performed at [facility], [address] [place], [place] [address]    Report Status 03/24/2017 FINAL  Final  Urine Culture     Status: Abnormal   Collection Time: 03/30/2017  3:36 PM  Result Value Ref Range Status   Specimen Description URINE, CLEAN CATCH  Final   Special Requests Normal  Final   Culture (A)  Final    >=100,000 COLONIES/mL METHICILLIN RESISTANT STAPHYLOCOCCUS AUREUS   Report Status 03/24/2017 FINAL  Final   Organism ID, Bacteria METHICILLIN RESISTANT STAPHYLOCOCCUS AUREUS (A)  Final      Susceptibility   Methicillin resistant staphylococcus aureus - MIC*    CIPROFLOXACIN <=0.5 SENSITIVE Sensitive     GENTAMICIN <=0.5 SENSITIVE Sensitive     NITROFURANTOIN <=16 SENSITIVE Sensitive     OXACILLIN >=4 RESISTANT Resistant     TETRACYCLINE <=1 SENSITIVE Sensitive     VANCOMYCIN <=0.5 SENSITIVE Sensitive     TRIMETH/SULFA <=10 SENSITIVE Sensitive     CLINDAMYCIN <=0.25 SENSITIVE Sensitive     RIFAMPIN <=0.5 SENSITIVE Sensitive     Inducible Clindamycin NEGATIVE Sensitive     * >=100,000 COLONIES/mL METHICILLIN RESISTANT STAPHYLOCOCCUS AUREUS  Culture, blood (routine x 2)     Status: Abnormal   Collection Time: 03/23/17  5:50 AM  Result Value Ref Range Status   Specimen Description BLOOD LEFT HAND  Final   Special Requests IN PEDIATRIC BOTTLE Blood Culture adequate volume  Final   Culture  Setup Time   Final    GRAM POSITIVE COCCI IN PEDIATRIC BOTTLE CRITICAL RESULT CALLED TO, READ BACK BY AND VERIFIED WITH: NICK GLOGOVAC AT 0743 ON [id number] BY SJW    Culture (A)  Final    STAPHYLOCOCCUS AUREUS SUSCEPTIBILITIES PERFORMED ON PREVIOUS CULTURE WITHIN THE LAST 5 DAYS. Performed at [facility], [address] [place], [place] [address]    Report Status 03/26/2017 FINAL  Final  Culture, blood (routine x 2)     Status: Abnormal   Collection Time: 03/23/17  5:50 AM  Result Value Ref Range Status   Specimen Description BLOOD LEFT ANTECUBITAL  Final   Special Requests IN PEDIATRIC BOTTLE Blood Culture adequate volume  Final   Culture  Setup Time   Final    GRAM POSITIVE COCCI IN PEDIATRIC BOTTLE CRITICAL RESULT CALLED TO, READ BACK BY AND VERIFIED WITH: NICK GLOGOVAC PHARMD AT 0741 ON [id number] BY SJW    Culture (A)  Final    STAPHYLOCOCCUS AUREUS SUSCEPTIBILITIES PERFORMED ON PREVIOUS CULTURE WITHIN THE LAST 5 DAYS. Performed at [facility], [address], [place], [place] [address]    Report Status 03/26/2017 FINAL  Final  MRSA PCR Screening     Status: Abnormal   Collection Time: 03/15/2017  8:10 AM  Result Value Ref Range Status   MRSA by PCR POSITIVE (A) NEGATIVE Final    Comment:        The GeneXpert MRSA Assay (FDA approved for NASAL specimens only), is one component of a comprehensive MRSA colonization surveillance program. It is not intended to diagnose MRSA infection nor to guide or monitor treatment for MRSA infections. RESULT CALLED TO, READ BACK BY AND VERIFIED WITH: B.KAUR RN 1042 [id number] A.QUIZON   Culture, blood (Routine X 2) w Reflex to ID Panel     Status: None (Preliminary result)  Collection Time: 03/26/17 10:14 AM  Result Value Ref Range Status   Specimen Description BLOOD RIGHT ANTECUBITAL  Final   Special Requests   Final    BOTTLES DRAWN AEROBIC AND ANAEROBIC Blood Culture adequate volume   Culture   Final    NO GROWTH 4 DAYS Performed at [facility], [address] [place], [place] [address]    Report Status PENDING  Incomplete  Culture, blood (Routine X 2) w Reflex to ID Panel     Status: None (Preliminary result)   Collection Time: 03/26/17 10:14 AM  Result Value Ref Range Status   Specimen Description BLOOD LEFT  ANTECUBITAL  Final   Special Requests   Final    BOTTLES DRAWN AEROBIC AND ANAEROBIC Blood Culture adequate volume   Culture   Final    NO GROWTH 4 DAYS Performed at [facility], [address] [place], [place] [address]    Report Status PENDING  Incomplete  Body fluid culture     Status: None   Collection Time: 03/26/17  1:04 PM  Result Value Ref Range Status   Specimen Description ANKLE SYNOVIAL  Final   Special Requests NONE  Final   Gram Stain   Final    NO ORGANISMS SEEN WBC PRESENT, PREDOMINANTLY PMN Gram Stain Report Called to,Read Back By and Verified With: D.BLOCK RN AT 1412 ON 03/26/16 BY S.VANHOORNE    Culture   Final    RARE METHICILLIN RESISTANT STAPHYLOCOCCUS AUREUS RESULT CALLED TO, READ BACK BY AND VERIFIED WITH: D BLOCK RN AT 1335 ON [id number] BY SJW Performed at [facility], [address] [place], [place] [address]    Report Status 03/29/2017 FINAL  Final   Organism ID, Bacteria METHICILLIN RESISTANT STAPHYLOCOCCUS AUREUS  Final      Susceptibility   Methicillin resistant staphylococcus aureus - MIC*    CIPROFLOXACIN <=0.5 SENSITIVE Sensitive     ERYTHROMYCIN >=8 RESISTANT Resistant     GENTAMICIN <=0.5 SENSITIVE Sensitive     OXACILLIN >=4 RESISTANT Resistant     TETRACYCLINE <=1 SENSITIVE Sensitive     VANCOMYCIN 1 SENSITIVE Sensitive     TRIMETH/SULFA <=10 SENSITIVE Sensitive     CLINDAMYCIN <=0.25 SENSITIVE Sensitive     RIFAMPIN <=0.5 SENSITIVE Sensitive     Inducible Clindamycin NEGATIVE Sensitive     * RARE METHICILLIN RESISTANT STAPHYLOCOCCUS AUREUS  Anaerobic culture     Status: None (Preliminary result)   Collection Time: 03/26/17  1:04 PM  Result Value Ref Range Status   Specimen Description ANKLE SYNOVIAL  Final   Special Requests NONE  Final   Culture   Final    NO ANAEROBES ISOLATED; CULTURE IN PROGRESS FOR 5 DAYS   Report Status PENDING  Incomplete  Anaerobic culture     Status: None (Preliminary result)   Collection Time:  03/07/2017  3:53 PM  Result Value Ref Range Status   Specimen Description WOUND LEFT ANKLE  Final   Special Requests NONE  Final   Culture   Final    NO ANAEROBES ISOLATED; CULTURE IN PROGRESS FOR 5 DAYS   Report Status PENDING  Incomplete  Aerobic Culture (superficial specimen)     Status: None (Preliminary result)   Collection Time: 03/14/2017  3:53 PM  Result Value Ref Range Status   Specimen Description WOUND LEFT ANKLE  Final   Special Requests NONE  Final   Gram Stain   Final    RARE WBC PRESENT, PREDOMINANTLY  PMN NO ORGANISMS SEEN    Culture   Final    RARE STAPHYLOCOCCUS AUREUS SUSCEPTIBILITIES TO FOLLOW Performed at [facility], [address] [place], [place] [address]    Report Status PENDING  Incomplete         Radiology Studies: US Renal  Result Date: 03/30/2017 CLINICAL DATA:  Acute renal failure EXAM: RENAL / URINARY TRACT ULTRASOUND COMPLETE COMPARISON:  03/23/2017. FINDINGS: Right Kidney: Length: 11.3 cm. Scattered nonobstructing renal calculi are noted similar to that seen on prior CT examination. Left Kidney: Length: 11.3 cm. Scattered nonobstructing renal stones are noted. Known cystic lesion seen on recent CT is not as well appreciated on today's exam. Bladder: Not well distended. IMPRESSION: Bilateral nonobstructing renal stones similar to that seen on prior CT. Previously noted left renal cyst is not well appreciated on this exam. Electronically Signed   By: Alcide Clever M.D.   On: 03/30/2017 10:30        Scheduled Meds: . aspirin EC  81 mg Oral Daily  . furosemide  40 mg Intravenous Q12H  . gabapentin  600 mg Oral BID  . insulin aspart  0-15 Units Subcutaneous TID WC  . insulin aspart  0-5 Units Subcutaneous QHS  . insulin detemir  15 Units Subcutaneous QHS  . lactulose  30 g Oral TID  . multivitamin with minerals  1 tablet Oral Daily  . pantoprazole (PROTONIX) IV  40 mg Intravenous Q12H  . propranolol  20 mg Oral BID  . rifaximin  550 mg  Oral BID  . sodium chloride flush  3 mL Intravenous Q12H  . tamsulosin  0.4 mg Oral Daily  . venlafaxine  150 mg Oral Q breakfast  . venlafaxine  75 mg Oral QHS   Continuous Infusions: . sodium chloride       LOS: 9 days    Time spent: 35 mins    Ramiro Harvest, MD [facility] Pager [phone]  If 7PM-7AM, please contact night-coverage [email or web address] Password [email or web address] 03/30/2017, 1:35 PM

## 2017-03-30 NOTE — Progress Notes (Signed)
Patient has now voided incontinent x2 times this shift. Patient still very edematous all over. Elevated ext. On pillows. Patient is still lethargic but awakens with verbal stimuli. Wife at bedside. Will c/t monitor. Day shift will be notified to f/u with family again to see if they would like foley reinserted on day shift.

## 2017-03-30 NOTE — Progress Notes (Signed)
Subjective:  Patient is obtunded and wearing mittens   Antibiotics:  Anti-infectives (From admission, onward)   Start     Dose/Rate Route Frequency Ordered Stop   03/30/17 2200  ceftaroline (TEFLARO) 400 mg in sodium chloride 0.9 % 250 mL IVPB    Comments:  Pharmacy please review and ADJUST DOSE   400 mg 250 mL/hr over 60 Minutes Intravenous Every 12 hours 03/30/17 1607     03/29/17 2200  vancomycin (VANCOCIN) 1,250 mg in sodium chloride 0.9 % 250 mL IVPB  Status:  Discontinued     1,250 mg 166.7 mL/hr over 90 Minutes Intravenous Every 24 hours 03/29/17 1341 03/29/17 1746   2017-04-18 2200  vancomycin (VANCOCIN) 1,750 mg in sodium chloride 0.9 % 500 mL IVPB  Status:  Discontinued     1,750 mg 250 mL/hr over 120 Minutes Intravenous Every 24 hours 04-18-2017 0856 03/29/17 1341   04-18-17 1603  vancomycin (VANCOCIN) powder  Status:  Discontinued       As needed 04/18/17 1603 04/18/17 1626   03/27/17 2200  rifaximin (XIFAXAN) tablet 550 mg     550 mg Oral 2 times daily 03/27/17 1923     03/26/17 0800  vancomycin (VANCOCIN) 1,500 mg in sodium chloride 0.9 % 500 mL IVPB  Status:  Discontinued     1,500 mg 250 mL/hr over 120 Minutes Intravenous Every 24 hours 03/24/2017 1102 2017/04/18 0856   03/22/17 0600  vancomycin (VANCOCIN) 1,750 mg in sodium chloride 0.9 % 500 mL IVPB  Status:  Discontinued     1,750 mg 250 mL/hr over 120 Minutes Intravenous Every 24 hours 03/22/17 0547 03/17/2017 1102   03/26/2017 2000  cefTRIAXone (ROCEPHIN) 2 g in dextrose 5 % 50 mL IVPB  Status:  Discontinued     2 g 100 mL/hr over 30 Minutes Intravenous Every 24 hours 03/20/2017 1743 03/24/17 1421      Medications: Scheduled Meds: . aspirin EC  81 mg Oral Daily  . furosemide  40 mg Intravenous Q12H  . gabapentin  600 mg Oral BID  . insulin aspart  0-15 Units Subcutaneous TID WC  . insulin aspart  0-5 Units Subcutaneous QHS  . insulin detemir  15 Units Subcutaneous QHS  . lactulose  30 g Oral TID  .  multivitamin with minerals  1 tablet Oral Daily  . pantoprazole (PROTONIX) IV  40 mg Intravenous Q12H  . propranolol  20 mg Oral BID  . rifaximin  550 mg Oral BID  . sodium chloride flush  3 mL Intravenous Q12H  . tamsulosin  0.4 mg Oral Daily  . venlafaxine  150 mg Oral Q breakfast  . venlafaxine  75 mg Oral QHS   Continuous Infusions: . sodium chloride    . [START ON 03/31/2017] albumin human    . ceFTAROline (TEFLARO) IV     PRN Meds:.sodium chloride, albuterol, gadobenate dimeglumine, guaiFENesin-dextromethorphan, LORazepam, metoCLOPramide **OR** metoCLOPramide (REGLAN) injection, metoprolol tartrate, morphine injection, ondansetron **OR** ondansetron (ZOFRAN) IV, opium-belladonna, oxyCODONE, sodium chloride flush    Objective: Weight change:   Intake/Output Summary (Last 24 hours) at 03/30/2017 1702 Last data filed at 03/30/2017 1610 Gross per 24 hour  Intake 948 ml  Output -  Net 948 ml   Blood pressure (!) 144/63, pulse (!) 109, temperature (!) 97.4 F (36.3 C), temperature source Axillary, resp. rate (!) 21, height 6\' 3"  (1.905 m), weight 239 lb 13.8 oz (108.8 kg), SpO2 92 %. Temp:  [97.4 F (36.3 C)-98.9  F (37.2 C)] 97.4 F (36.3 C) (01/27 1422) Pulse Rate:  [95-111] 109 (01/27 1333) Resp:  [17-21] 21 (01/27 1000) BP: (125-151)/(63-93) 144/63 (01/27 1422) SpO2:  [89 %-95 %] 92 % (01/27 1333) Weight:  [239 lb 13.8 oz (108.8 kg)-244 lb 14.9 oz (111.1 kg)] 239 lb 13.8 oz (108.8 kg) (01/27 1422)  Physical Exam: General: Alert and awake confused, wearing mittens HEENT:  EOMI, icteric, jaundiced CVS tahchy rate, normal r,   Chest:  no wheezing, chest wall is tender, fluctuant on right Abdomen: soft , distended w ascites  Extremities:left ankle bandaged, worsening edema Skin: no rashes Neuro: nonfocal  CBC:  CBC Latest Ref Rng & Units 03/30/2017 03/29/2017 2017-04-19  WBC 4.0 - 10.5 K/uL 26.2(H) 31.2(H) 29.1(H)  Hemoglobin 13.0 - 17.0 g/dL 12.4(L) 12.7(L) 12.5(L)    Hematocrit 39.0 - 52.0 % 36.5(L) 37.5(L) 37.2(L)  Platelets 150 - 400 K/uL 182 217 160      BMET Recent Labs    03/29/17 0632 03/30/17 0552 03/30/17 0924  NA 131* 137  --   K 5.2* 5.5* 6.0*  CL 107 113*  --   CO2 17* 17*  --   GLUCOSE 160* 154*  --   BUN 62* 74*  --   CREATININE 1.71* 2.20*  --   CALCIUM 7.6* 7.8*  --      Liver Panel  Recent Labs    04-19-2017 0600 03/30/17 0552  PROT 6.5 6.8  ALBUMIN 1.5* 1.6*  AST 123* 152*  ALT 55 60  ALKPHOS 296* 252*  BILITOT 9.2* 11.7*  BILIDIR  --  6.6*  IBILI  --  5.1*       Sedimentation Rate No results for input(s): ESRSEDRATE in the last 72 hours. C-Reactive Protein No results for input(s): CRP in the last 72 hours.  Micro Results: Recent Results (from the past 720 hour(s))  Culture, blood (routine x 2)     Status: Abnormal   Collection Time: 03/09/2017 11:31 AM  Result Value Ref Range Status   Specimen Description BLOOD BLOOD RIGHT FOREARM  Final   Special Requests   Final    BOTTLES DRAWN AEROBIC AND ANAEROBIC Blood Culture adequate volume   Culture  Setup Time   Final    GRAM POSITIVE COCCI IN BOTH AEROBIC AND ANAEROBIC BOTTLES CRITICAL RESULT CALLED TO, READ BACK BY AND VERIFIED WITHPeggyann Juba [facility] 1610 03/22/17 A BROWNING Performed at [facility], [address], [place], [place] [address]    Culture METHICILLIN RESISTANT STAPHYLOCOCCUS AUREUS (A)  Final   Report Status 03/24/2017 FINAL  Final   Organism ID, Bacteria METHICILLIN RESISTANT STAPHYLOCOCCUS AUREUS  Final      Susceptibility   Methicillin resistant staphylococcus aureus - MIC*    CIPROFLOXACIN <=0.5 SENSITIVE Sensitive     ERYTHROMYCIN >=8 RESISTANT Resistant     GENTAMICIN <=0.5 SENSITIVE Sensitive     OXACILLIN RESISTANT Resistant     TETRACYCLINE <=1 SENSITIVE Sensitive     VANCOMYCIN <=0.5 SENSITIVE Sensitive     TRIMETH/SULFA <=10 SENSITIVE Sensitive     CLINDAMYCIN <=0.25 SENSITIVE Sensitive     RIFAMPIN <=0.5  SENSITIVE Sensitive     Inducible Clindamycin NEGATIVE Sensitive     * METHICILLIN RESISTANT STAPHYLOCOCCUS AUREUS  Blood Culture ID Panel (Reflexed)     Status: Abnormal   Collection Time: 03/16/2017 11:31 AM  Result Value Ref Range Status   Enterococcus species NOT DETECTED NOT DETECTED Final   Listeria monocytogenes NOT DETECTED NOT DETECTED Final  Staphylococcus species DETECTED (A) NOT DETECTED Final    Comment: CRITICAL RESULT CALLED TO, READ BACK BY AND VERIFIED WITHPeggyann Juba PHARMD 1610 03/22/17 A BROWNING    Staphylococcus aureus DETECTED (A) NOT DETECTED Final    Comment: Methicillin (oxacillin)-resistant Staphylococcus aureus (MRSA). MRSA is predictably resistant to beta-lactam antibiotics (except ceftaroline). Preferred therapy is vancomycin unless clinically contraindicated. Patient requires contact precautions if  hospitalized. CRITICAL RESULT CALLED TO, READ BACK BY AND VERIFIED WITH: Peggyann Juba PHARMD [id number] 03/22/17 A BROWNING    Methicillin resistance DETECTED (A) NOT DETECTED Final    Comment: CRITICAL RESULT CALLED TO, READ BACK BY AND VERIFIED WITH: Peggyann Juba PHARMD [id number] 03/22/17 A BROWNING    Streptococcus species NOT DETECTED NOT DETECTED Final   Streptococcus agalactiae NOT DETECTED NOT DETECTED Final   Streptococcus pneumoniae NOT DETECTED NOT DETECTED Final   Streptococcus pyogenes NOT DETECTED NOT DETECTED Final   Acinetobacter baumannii NOT DETECTED NOT DETECTED Final   Enterobacteriaceae species NOT DETECTED NOT DETECTED Final   Enterobacter cloacae complex NOT DETECTED NOT DETECTED Final   Escherichia coli NOT DETECTED NOT DETECTED Final   Klebsiella oxytoca NOT DETECTED NOT DETECTED Final   Klebsiella pneumoniae NOT DETECTED NOT DETECTED Final   Proteus species NOT DETECTED NOT DETECTED Final   Serratia marcescens NOT DETECTED NOT DETECTED Final   Haemophilus influenzae NOT DETECTED NOT DETECTED Final   Neisseria meningitidis NOT DETECTED NOT DETECTED  Final   Pseudomonas aeruginosa NOT DETECTED NOT DETECTED Final   Candida albicans NOT DETECTED NOT DETECTED Final   Candida glabrata NOT DETECTED NOT DETECTED Final   Candida krusei NOT DETECTED NOT DETECTED Final   Candida parapsilosis NOT DETECTED NOT DETECTED Final   Candida tropicalis NOT DETECTED NOT DETECTED Final    Comment: Performed at [facility], [address] [place], [place] [address]  Culture, blood (routine x 2)     Status: Abnormal   Collection Time: 03/13/2017  1:07 PM  Result Value Ref Range Status   Specimen Description BLOOD BLOOD LEFT FOREARM  Final   Special Requests   Final    BOTTLES DRAWN AEROBIC AND ANAEROBIC Blood Culture adequate volume   Culture  Setup Time   Final    GRAM POSITIVE COCCI IN BOTH AEROBIC AND ANAEROBIC BOTTLES CRITICAL RESULT CALLED TO, READ BACK BY AND VERIFIED WITH: Sophronia Simas PHARMD, AT 4782 03/22/17 BY D. VANHOOK    Culture (A)  Final    STAPHYLOCOCCUS AUREUS SUSCEPTIBILITIES PERFORMED ON PREVIOUS CULTURE WITHIN THE LAST 5 DAYS. Performed at [facility], [address] [place], [place] [address]    Report Status 03/24/2017 FINAL  Final  Urine Culture     Status: Abnormal   Collection Time: 03/14/2017  3:36 PM  Result Value Ref Range Status   Specimen Description URINE, CLEAN CATCH  Final   Special Requests Normal  Final   Culture (A)  Final    >=100,000 COLONIES/mL METHICILLIN RESISTANT STAPHYLOCOCCUS AUREUS   Report Status 03/24/2017 FINAL  Final   Organism ID, Bacteria METHICILLIN RESISTANT STAPHYLOCOCCUS AUREUS (A)  Final      Susceptibility   Methicillin resistant staphylococcus aureus - MIC*    CIPROFLOXACIN <=0.5 SENSITIVE Sensitive     GENTAMICIN <=0.5 SENSITIVE Sensitive     NITROFURANTOIN <=16 SENSITIVE Sensitive     OXACILLIN >=4 RESISTANT Resistant     TETRACYCLINE <=1 SENSITIVE Sensitive     VANCOMYCIN <=0.5 SENSITIVE Sensitive     TRIMETH/SULFA <=10 SENSITIVE Sensitive  CLINDAMYCIN <=0.25 SENSITIVE  Sensitive     RIFAMPIN <=0.5 SENSITIVE Sensitive     Inducible Clindamycin NEGATIVE Sensitive     * >=100,000 COLONIES/mL METHICILLIN RESISTANT STAPHYLOCOCCUS AUREUS  Culture, blood (routine x 2)     Status: Abnormal   Collection Time: 03/23/17  5:50 AM  Result Value Ref Range Status   Specimen Description BLOOD LEFT HAND  Final   Special Requests IN PEDIATRIC BOTTLE Blood Culture adequate volume  Final   Culture  Setup Time   Final    GRAM POSITIVE COCCI IN PEDIATRIC BOTTLE CRITICAL RESULT CALLED TO, READ BACK BY AND VERIFIED WITH: NICK GLOGOVAC AT 0743 ON [id number] BY SJW    Culture (A)  Final    STAPHYLOCOCCUS AUREUS SUSCEPTIBILITIES PERFORMED ON PREVIOUS CULTURE WITHIN THE LAST 5 DAYS. Performed at [facility], [address], [place], [place] [address]    Report Status 03/26/2017 FINAL  Final  Culture, blood (routine x 2)     Status: Abnormal   Collection Time: 03/23/17  5:50 AM  Result Value Ref Range Status   Specimen Description BLOOD LEFT ANTECUBITAL  Final   Special Requests IN PEDIATRIC BOTTLE Blood Culture adequate volume  Final   Culture  Setup Time   Final    GRAM POSITIVE COCCI IN PEDIATRIC BOTTLE CRITICAL RESULT CALLED TO, READ BACK BY AND VERIFIED WITH: NICK GLOGOVAC PHARMD AT 0741 ON [id number] BY SJW    Culture (A)  Final    STAPHYLOCOCCUS AUREUS SUSCEPTIBILITIES PERFORMED ON PREVIOUS CULTURE WITHIN THE LAST 5 DAYS. Performed at [facility], [address] [place], [place] [address]    Report Status 03/26/2017 FINAL  Final  MRSA PCR Screening     Status: Abnormal   Collection Time: 03/22/2017  8:10 AM  Result Value Ref Range Status   MRSA by PCR POSITIVE (A) NEGATIVE Final    Comment:        The GeneXpert MRSA Assay (FDA approved for NASAL specimens only), is one component of a comprehensive MRSA colonization surveillance program. It is not intended to diagnose MRSA infection nor to guide or monitor treatment for MRSA infections. RESULT  CALLED TO, READ BACK BY AND VERIFIED WITH: B.KAUR RN 1042 [id number] [email or web address]   Culture, blood (Routine X 2) w Reflex to ID Panel     Status: None (Preliminary result)   Collection Time: 03/26/17 10:14 AM  Result Value Ref Range Status   Specimen Description BLOOD RIGHT ANTECUBITAL  Final   Special Requests   Final    BOTTLES DRAWN AEROBIC AND ANAEROBIC Blood Culture adequate volume   Culture   Final    NO GROWTH 4 DAYS Performed at [facility], [address], [place], [place] [address]    Report Status PENDING  Incomplete  Culture, blood (Routine X 2) w Reflex to ID Panel     Status: None (Preliminary result)   Collection Time: 03/26/17 10:14 AM  Result Value Ref Range Status   Specimen Description BLOOD LEFT ANTECUBITAL  Final   Special Requests   Final    BOTTLES DRAWN AEROBIC AND ANAEROBIC Blood Culture adequate volume   Culture   Final    NO GROWTH 4 DAYS Performed at [facility], [address] [place], [place] [address]    Report Status PENDING  Incomplete  Body fluid culture     Status: None   Collection Time: 03/26/17  1:04 PM  Result Value Ref Range Status  Specimen Description ANKLE SYNOVIAL  Final   Special Requests NONE  Final   Gram Stain   Final    NO ORGANISMS SEEN WBC PRESENT, PREDOMINANTLY PMN Gram Stain Report Called to,Read Back By and Verified With: D.BLOCK RN AT 1412 ON 03/26/16 BY S.VANHOORNE    Culture   Final    RARE METHICILLIN RESISTANT STAPHYLOCOCCUS AUREUS RESULT CALLED TO, READ BACK BY AND VERIFIED WITH: D BLOCK RN AT 1335 ON [id number] BY SJW Performed at [facility], [address] [place], [place] [address]    Report Status 03/29/2017 FINAL  Final   Organism ID, Bacteria METHICILLIN RESISTANT STAPHYLOCOCCUS AUREUS  Final      Susceptibility   Methicillin resistant staphylococcus aureus - MIC*    CIPROFLOXACIN <=0.5 SENSITIVE Sensitive     ERYTHROMYCIN >=8 RESISTANT Resistant     GENTAMICIN <=0.5 SENSITIVE Sensitive      OXACILLIN >=4 RESISTANT Resistant     TETRACYCLINE <=1 SENSITIVE Sensitive     VANCOMYCIN 1 SENSITIVE Sensitive     TRIMETH/SULFA <=10 SENSITIVE Sensitive     CLINDAMYCIN <=0.25 SENSITIVE Sensitive     RIFAMPIN <=0.5 SENSITIVE Sensitive     Inducible Clindamycin NEGATIVE Sensitive     * RARE METHICILLIN RESISTANT STAPHYLOCOCCUS AUREUS  Anaerobic culture     Status: None (Preliminary result)   Collection Time: 03/26/17  1:04 PM  Result Value Ref Range Status   Specimen Description ANKLE SYNOVIAL  Final   Special Requests NONE  Final   Culture   Final    NO ANAEROBES ISOLATED; CULTURE IN PROGRESS FOR 5 DAYS   Report Status PENDING  Incomplete  Anaerobic culture     Status: None (Preliminary result)   Collection Time: Jul 24, 2017  3:53 PM  Result Value Ref Range Status   Specimen Description WOUND LEFT ANKLE  Final   Special Requests NONE  Final   Culture   Final    NO ANAEROBES ISOLATED; CULTURE IN PROGRESS FOR 5 DAYS   Report Status PENDING  Incomplete  Aerobic Culture (superficial specimen)     Status: None (Preliminary result)   Collection Time: Jul 24, 2017  3:53 PM  Result Value Ref Range Status   Specimen Description WOUND LEFT ANKLE  Final   Special Requests NONE  Final   Gram Stain   Final    RARE WBC PRESENT, PREDOMINANTLY PMN NO ORGANISMS SEEN    Culture   Final    RARE STAPHYLOCOCCUS AUREUS SUSCEPTIBILITIES TO FOLLOW Performed at [facility], [address] [place], [place] [address]    Report Status PENDING  Incomplete    Studies/Results: Koreas Renal  Result Date: 03/30/2017 CLINICAL DATA:  Acute renal failure EXAM: RENAL / URINARY TRACT ULTRASOUND COMPLETE COMPARISON:  04/02/2017. FINDINGS: Right Kidney: Length: 11.3 cm. Scattered nonobstructing renal calculi are noted similar to that seen on prior CT examination. Left Kidney: Length: 11.3 cm. Scattered nonobstructing renal stones are noted. Known cystic lesion seen on recent CT is not as well appreciated on  today's exam. Bladder: Not well distended. IMPRESSION: Bilateral nonobstructing renal stones similar to that seen on prior CT. Previously noted left renal cyst is not well appreciated on this exam. Electronically Signed   By: Alcide CleverMark  Lukens M.D.   On: 03/30/2017 10:30      Assessment/Plan:  INTERVAL HISTORY:   Vancomycin became supratherapeutic yesterday and cr had risen to 1.7 now above 2  LFTs worse esp bilirubin   Principal Problem:   Delirium due to  another medical condition Active Problems:   BIPOLAR AFFECTIVE DISORDER   Chronic pain syndrome   Esophageal varices (HCC)   Hepatic cirrhosis due to chronic hepatitis C infection (HCC)   Hemoptysis   DM type 2 without retinopathy (HCC)   Chronic hepatitis C (HCC)   Leukocytosis   Sepsis (HCC)   Altered mental status   Lower urinary tract infectious disease   Ankle pain, left   Left ankle effusion   Hematemesis   Acute urinary retention   Staphylococcal arthritis of left ankle (HCC)   Sepsis due to methicillin resistant Staphylococcus aureus (HCC)   Abscess of upper lobe of right lung without pneumonia (HCC)   Hyperkalemia   Hyperbilirubinemia   ARF (acute renal failure) (HCC)   Ascites   Mass of chest wall, right    Kurt Reid is a 67 y.o. male with  with treated hep c cirrhosis admitted for MRSA bacteremia with sepsis  found to have left ankle septic arthritis plus new right lung abscess with associated osteo/chest wall abscess  He continues to have worsening renal function, hepatic function with worsening confusion.  #1 Metastatic MRSA infection       [facility] Staphylococcus aureus bacteremia   Staphylococcus aureus bacteremia (SAB) is associated with a high rate of complications and mortality.  Specific aspects of clinical management are critical to optimizing the outcome of patients with SAB.  Therefore, the [facility] Antimicrobial Management Team [facility]) has initiated an  intervention aimed at improving the management of SAB at [facility].  To do so, Infectious Diseases physicians are providing an evidence-based consult for the management of all patients with SAB.     Yes No Comments  Perform follow-up blood cultures (even if the patient is afebrile) to ensure clearance of bacteremia [x]  []  Cultures from 23rd NG 4 days  Remove vascular catheter and obtain follow-up blood cultures after the removal of the catheter []  []  IF he needs central line he can have one from ID standpoint  Perform echocardiography to evaluate for endocarditis (transthoracic ECHO is 40-50% sensitive, TEE is > 90% sensitive) []  []  Please keep in mind, that neither test can definitively EXCLUDE endocarditis, and that should clinical suspicion remain high for endocarditis the patient should then still be treated with an "endocarditis" duration of therapy = 6 weeks he is going to get 6-8 weeks of IV abx if he makes  Consult electrophysiologist to evaluate implanted cardiac device (pacemaker, ICD) []  []    Ensure source control []  []  Have all abscesses been drained effectively? Have deep seeded infections (septic joints or osteomyelitis) had appropriate surgical debridement?   NO HE MAY NEED CT SURGERY IF he could survive this   Investigate for "metastatic" sites of infection []  []  Does the patient have ANY symptom or physical exam finding that would suggest a deeper infection (back or neck pain that may be suggestive of vertebral osteomyelitis or epidural abscess, muscle pain that could be a symptom of pyomyositis)?  Keep in mind that for deep seeded infections MRI imaging with contrast is preferred rather than other often insensitive tests such as plain x-rays, especially early in a patient's presentation. LUNGS AND ANKLE ARE ONLY KNOWN SITES BESIDES BLOODSTREAM  Change antibiotic therapy to Teflaro []  []  Beta-lactam antibiotics are preferred for MSSA due to higher cure rates.   If on Vancomycin,  goal trough should be 15 - 20 mcg/mL  Estimated duration of IV antibiotic therapy:  8 WEEKS []  []   Consult case management for probably prolonged outpatient IV antibiotic therapy    #2 Worsening renal failure: multifactorial in patient with cirrhosis, sepsis, MRSA bacteremia and now unfortunately supratherapeutic vancomycin levels  Unfortunately his prognosis appears poor in short and long term (see below)  I am changing to Teflaro  He may not actually need teflaro yet since vancomycin levesl so high  Agree with workup from primary team  I think he will need Nephrology consult.   I doubt he would be a candidate for HD  #3 Worsening liver failure in context of sepsis, renal failure. Greatly appreciate Dr. Ardell Isaacs help.  #4 Goals of care: I had fairly frank conversation with his wife and daughter that his course is worsening and that he would likely soon be facing decisions re potential escalation of care including intubation.   I encouraged them to talk to Dr. Janee Morn and other consultants and emphasized that escalating care needed to be weighed vs chances of causing unnecessary suffering esp if his course continues to progress in current trajectory  #5 Lung abscess, chest wall abscess: I have personally reviewed these images.I suspect this is all MRSA pathology. He was to have IR guided biopsy.   WOuld he be candidate for CT surgery? If he is not, again I think we need to pivot in terms of direction of his care  I would strongly recommend palliative care consult  Dr. Drue Second is back on tomorrow  I spent greater than 35 minutes with the patient including greater than 50% of time in face to face counsel of the patient's wife and daughter re his trajectory, re nature of SAB, mortality and morbidity of this condition of sepsis and "metastatic" staph aureus infection, nature of vancomycin dosing and how high levels can contribute to renal failure esp in patient with hepatic failure and  spesis and in coordination of his care.          LOS: 9 days   Acey Lav 03/30/2017, 5:02 PM

## 2017-03-30 NOTE — Progress Notes (Signed)
     Subjective: 2 Days Post-Op Procedure(s) (LRB): IRRIGATION AND DEBRIDIMENT LEFT ANKLE (Left) Somulent, obtundant, receiving lactulose. Took in some eggs for breakfast. Patient reports pain as moderate.    Objective:   VITALS:  Temp:  [98.1 F (36.7 C)-98.9 F (37.2 C)] 98.6 F (37 C) (01/27 1000) Pulse Rate:  [85-111] 103 (01/27 1000) Resp:  [17-21] 21 (01/27 1000) BP: (125-151)/(69-96) 125/69 (01/27 1000) SpO2:  [89 %-95 %] 94 % (01/27 1000) Weight:  [244 lb 14.9 oz (111.1 kg)] 244 lb 14.9 oz (111.1 kg) (01/27 0543)  Neurologically intact ABD soft Neurovascular intact Sensation intact distally Intact pulses distally Incision: dressing C/D/I   LABS Recent Labs    03/27/17 1332 03/06/2017 0600 03/29/17 0632 03/30/17 0552  HGB 12.7* 12.5* 12.7* 12.4*  WBC 22.5* 29.1* 31.2* 26.2*  PLT 170 160 217 182   Recent Labs    03/29/17 0632 03/30/17 0552 03/30/17 0924  NA 131* 137  --   K 5.2* 5.5* 6.0*  CL 107 113*  --   CO2 17* 17*  --   BUN 62* 74*  --   CREATININE 1.71* 2.20*  --   GLUCOSE 160* 154*  --    Recent Labs    03/30/17 0924  INR 1.71     Assessment/Plan: 2 Days Post-Op Procedure(s) (LRB): IRRIGATION AND DEBRIDIMENT LEFT ANKLE (Left)  Advance diet may need nutrition to work on hepatic diet, low carb to improve nutrition status, likely is consuming a large amount of calories with current sepsis. He is scheduled for intervention for chest wall pathology, biopsy vs  I and D.  Up with therapy when able to tolerate.   Vira BrownsJames Bhavik Reid 03/30/2017, 10:31 AMPatient ID: Kurt Reid, male   DOB: 07/09/1950, 67 y.o.   MRN: [id number]

## 2017-03-31 ENCOUNTER — Inpatient Hospital Stay (HOSPITAL_COMMUNITY): Payer: Medicare Other

## 2017-03-31 ENCOUNTER — Encounter (HOSPITAL_COMMUNITY): Payer: Self-pay | Admitting: Interventional Radiology

## 2017-03-31 ENCOUNTER — Encounter (HOSPITAL_COMMUNITY): Admission: EM | Disposition: E | Payer: Self-pay | Source: Home / Self Care | Attending: Internal Medicine

## 2017-03-31 DIAGNOSIS — J189 Pneumonia, unspecified organism: Secondary | ICD-10-CM

## 2017-03-31 DIAGNOSIS — N179 Acute kidney failure, unspecified: Secondary | ICD-10-CM

## 2017-03-31 DIAGNOSIS — K767 Hepatorenal syndrome: Secondary | ICD-10-CM

## 2017-03-31 DIAGNOSIS — Z515 Encounter for palliative care: Secondary | ICD-10-CM

## 2017-03-31 DIAGNOSIS — Z7189 Other specified counseling: Secondary | ICD-10-CM

## 2017-03-31 HISTORY — PX: IR PARACENTESIS: IMG2679

## 2017-03-31 LAB — URINALYSIS, ROUTINE W REFLEX MICROSCOPIC
Bilirubin Urine: NEGATIVE
GLUCOSE, UA: NEGATIVE mg/dL
Ketones, ur: NEGATIVE mg/dL
NITRITE: POSITIVE — AB
PROTEIN: NEGATIVE mg/dL
Specific Gravity, Urine: 1.01 (ref 1.005–1.030)
pH: 5 (ref 5.0–8.0)

## 2017-03-31 LAB — CULTURE, BLOOD (ROUTINE X 2)
CULTURE: NO GROWTH
Culture: NO GROWTH
Special Requests: ADEQUATE
Special Requests: ADEQUATE

## 2017-03-31 LAB — RENAL FUNCTION PANEL
ALBUMIN: 1.4 g/dL — AB (ref 3.5–5.0)
ANION GAP: 8 (ref 5–15)
BUN: 90 mg/dL — AB (ref 6–20)
CO2: 18 mmol/L — AB (ref 22–32)
Calcium: 7.7 mg/dL — ABNORMAL LOW (ref 8.9–10.3)
Chloride: 112 mmol/L — ABNORMAL HIGH (ref 101–111)
Creatinine, Ser: 2.96 mg/dL — ABNORMAL HIGH (ref 0.61–1.24)
GFR calc Af Amer: 24 mL/min — ABNORMAL LOW (ref >60)
GFR calc non Af Amer: 20 mL/min — ABNORMAL LOW (ref >60)
Glucose, Bld: 213 mg/dL — ABNORMAL HIGH (ref 65–99)
PHOSPHORUS: 5.5 mg/dL — AB (ref 2.5–4.6)
POTASSIUM: 5.3 mmol/L — AB (ref 3.5–5.1)
Sodium: 138 mmol/L (ref 135–145)

## 2017-03-31 LAB — BASIC METABOLIC PANEL
ANION GAP: 7 (ref 5–15)
BUN: 89 mg/dL — ABNORMAL HIGH (ref 6–20)
CALCIUM: 7.7 mg/dL — AB (ref 8.9–10.3)
CHLORIDE: 113 mmol/L — AB (ref 101–111)
CO2: 16 mmol/L — AB (ref 22–32)
CREATININE: 2.58 mg/dL — AB (ref 0.61–1.24)
GFR calc Af Amer: 28 mL/min — ABNORMAL LOW (ref >60)
GFR calc non Af Amer: 24 mL/min — ABNORMAL LOW (ref >60)
GLUCOSE: 233 mg/dL — AB (ref 65–99)
Potassium: 6.3 mmol/L (ref 3.5–5.1)
Sodium: 136 mmol/L (ref 135–145)

## 2017-03-31 LAB — CBC WITH DIFFERENTIAL/PLATELET
BASOS PCT: 1 %
Basophils Absolute: 0.3 10*3/uL — ABNORMAL HIGH (ref 0.0–0.1)
EOS PCT: 0 %
Eosinophils Absolute: 0 10*3/uL (ref 0.0–0.7)
HEMATOCRIT: 36.9 % — AB (ref 39.0–52.0)
HEMOGLOBIN: 12.4 g/dL — AB (ref 13.0–17.0)
LYMPHS ABS: 2.4 10*3/uL (ref 0.7–4.0)
Lymphocytes Relative: 9 %
MCH: 31 pg (ref 26.0–34.0)
MCHC: 33.6 g/dL (ref 30.0–36.0)
MCV: 92.3 fL (ref 78.0–100.0)
MONO ABS: 1.3 10*3/uL — AB (ref 0.1–1.0)
MONOS PCT: 5 %
Neutro Abs: 22.4 10*3/uL — ABNORMAL HIGH (ref 1.7–7.7)
Neutrophils Relative %: 85 %
Platelets: 159 10*3/uL (ref 150–400)
RBC: 4 MIL/uL — ABNORMAL LOW (ref 4.22–5.81)
RDW: 17.8 % — AB (ref 11.5–15.5)
WBC: 26.4 10*3/uL — ABNORMAL HIGH (ref 4.0–10.5)

## 2017-03-31 LAB — AEROBIC CULTURE W GRAM STAIN (SUPERFICIAL SPECIMEN)

## 2017-03-31 LAB — BODY FLUID CELL COUNT WITH DIFFERENTIAL
EOS FL: 0 %
Lymphs, Fluid: 5 %
MONOCYTE-MACROPHAGE-SEROUS FLUID: 10 % — AB (ref 50–90)
Neutrophil Count, Fluid: 85 % — ABNORMAL HIGH (ref 0–25)
WBC FLUID: 877 uL (ref 0–1000)

## 2017-03-31 LAB — HEPATIC FUNCTION PANEL
ALBUMIN: 1.4 g/dL — AB (ref 3.5–5.0)
ALK PHOS: 231 U/L — AB (ref 38–126)
ALT: 88 U/L — ABNORMAL HIGH (ref 17–63)
AST: 263 U/L — AB (ref 15–41)
BILIRUBIN DIRECT: 7.1 mg/dL — AB (ref 0.1–0.5)
Indirect Bilirubin: 6.9 mg/dL — ABNORMAL HIGH (ref 0.3–0.9)
Total Bilirubin: 14 mg/dL — ABNORMAL HIGH (ref 0.3–1.2)
Total Protein: 6.4 g/dL — ABNORMAL LOW (ref 6.5–8.1)

## 2017-03-31 LAB — ANAEROBIC CULTURE

## 2017-03-31 LAB — GLUCOSE, CAPILLARY
GLUCOSE-CAPILLARY: 203 mg/dL — AB (ref 65–99)
GLUCOSE-CAPILLARY: 239 mg/dL — AB (ref 65–99)
GLUCOSE-CAPILLARY: 194 mg/dL — AB (ref 65–99)
GLUCOSE-CAPILLARY: 186 mg/dL — AB (ref 65–99)
Glucose-Capillary: 198 mg/dL — ABNORMAL HIGH (ref 65–99)

## 2017-03-31 LAB — LACTATE DEHYDROGENASE, PLEURAL OR PERITONEAL FLUID: LD, Fluid: 86 U/L — ABNORMAL HIGH (ref 3–23)

## 2017-03-31 LAB — CREATININE, URINE, RANDOM: Creatinine, Urine: 50.41 mg/dL

## 2017-03-31 LAB — GLUCOSE, PLEURAL OR PERITONEAL FLUID: GLUCOSE FL: 240 mg/dL

## 2017-03-31 LAB — PROTEIN, PLEURAL OR PERITONEAL FLUID: Total protein, fluid: <3 g/dL

## 2017-03-31 LAB — MAGNESIUM: Magnesium: 2.5 mg/dL — ABNORMAL HIGH (ref 1.7–2.4)

## 2017-03-31 LAB — PROTIME-INR
INR: 1.92
Prothrombin Time: 21.8 seconds — ABNORMAL HIGH (ref 11.4–15.2)

## 2017-03-31 LAB — AEROBIC CULTURE  (SUPERFICIAL SPECIMEN)

## 2017-03-31 LAB — SODIUM, URINE, RANDOM: Sodium, Ur: 56 mmol/L

## 2017-03-31 SURGERY — EXCISION, MASS, CHEST WALL
Anesthesia: General

## 2017-03-31 MED ORDER — LIDOCAINE HCL 1 % IJ SOLN
INTRAMUSCULAR | Status: AC
  Filled 2017-03-31: qty 20

## 2017-03-31 MED ORDER — STERILE WATER FOR INJECTION IV SOLN
INTRAVENOUS | Status: DC
  Administered 2017-03-31 – 2017-04-02 (×4): via INTRAVENOUS
  Filled 2017-03-31 (×7): qty 850

## 2017-03-31 MED ORDER — FENTANYL CITRATE (PF) 100 MCG/2ML IJ SOLN
INTRAMUSCULAR | Status: AC
  Filled 2017-03-31: qty 4

## 2017-03-31 MED ORDER — VITAMIN K1 10 MG/ML IJ SOLN
10.0000 mg | Freq: Every day | INTRAMUSCULAR | Status: DC
  Administered 2017-04-01 – 2017-04-02 (×2): 10 mg via SUBCUTANEOUS
  Filled 2017-03-31 (×3): qty 1

## 2017-03-31 MED ORDER — SODIUM BICARBONATE 8.4 % IV SOLN
25.0000 meq | Freq: Once | INTRAVENOUS | Status: DC
  Filled 2017-03-31: qty 50

## 2017-03-31 MED ORDER — INSULIN ASPART 100 UNIT/ML ~~LOC~~ SOLN
10.0000 [IU] | Freq: Once | SUBCUTANEOUS | Status: AC
  Administered 2017-03-31: 10 [IU] via SUBCUTANEOUS

## 2017-03-31 MED ORDER — SODIUM POLYSTYRENE SULFONATE PO POWD
45.0000 g | Freq: Once | ORAL | Status: DC
  Filled 2017-03-31: qty 45

## 2017-03-31 MED ORDER — ALBUMIN HUMAN 25 % IV SOLN
INTRAVENOUS | Status: AC
  Filled 2017-03-31: qty 100

## 2017-03-31 MED ORDER — DEXTROSE 50 % IV SOLN
1.0000 | Freq: Once | INTRAVENOUS | Status: AC
Start: 2017-03-31 — End: 2017-03-31
  Administered 2017-03-31: 50 mL via INTRAVENOUS
  Filled 2017-03-31: qty 50

## 2017-03-31 MED ORDER — LIDOCAINE HCL (PF) 1 % IJ SOLN
INTRAMUSCULAR | Status: AC | PRN
  Administered 2017-03-31: 30 mL

## 2017-03-31 MED ORDER — MIDAZOLAM HCL 2 MG/2ML IJ SOLN
INTRAMUSCULAR | Status: AC
  Filled 2017-03-31: qty 4

## 2017-03-31 MED ORDER — SODIUM POLYSTYRENE SULFONATE 15 GM/60ML PO SUSP: 45.0000 g | Freq: Once | ORAL | Status: DC

## 2017-03-31 NOTE — Consult Note (Signed)
Consultation Note Date: 03/16/2017   Patient Name: Kurt Reid  DOB: [birth year]  MRN: [id number]  Age / Sex: 67 y.o., male  PCP: Biagio Borg, MD Referring Physician: Eugenie Filler, MD  Reason for Consultation: Establishing goals of care  HPI/Patient Profile: 67 y.o. male  with past medical history of bipolar, ADHD, anxiety, depression, liver cirrhosis, Hep C, esophageal varices, HLD, h/o seizures, DM admitted on 04/02/2017 with abd pain and confusion. 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 Unfortunately he continues to have worsening organ failure and functional status. Palliative care requested for [place].   Clinical Assessment and Goals of Care: I met today at Kurt Reid' bedside with his wife and their close friend (they jokingly call her the "sister wive") at bedside. I could not get Kurt Reid to awaken at all and neither could they. They did say that he responded to them earlier today. Wife was tearful during my entire visit.   I introduced palliative care as a support to them and assistance to help them navigate illness and make decisions that they may be faced with this hospitalization. Wife is very tearful and shares that she is not ready to talk about her husband's illness today.   They shared with me that Kurt Reid "would give you the shirt off his back in the freezing cold." They describe him as the sweetest man they have ever known. They have 3 children together (2 sons and a daughter who is EMT). Wife explains that he was having pain in RUQ abd and then into back and chest that was being evaluated outpatient ~3 weeks prior to hospitalization. They had no clue that he would be this sick and are struggling to absorb the severity of his  illness. She does comment that she believes in miracles too.   Spent some time trying to build rapport. Encouraged self care for wife. I will follow up tomorrow.   Primary Decision Maker NEXT OF KIN wife    SUMMARY OF RECOMMENDATIONS   - Wife very tearful today and overwhelmed as she has had some conversation regarding poor prognosis - Focused today on emotional support and building rapport  Code Status/Advance Care Planning:  Full code - will need further discussion   Symptom Management:   Recommend placement of NGT/Cortrak in order for him to obtain Lactulose (which he has been too lethargic to take) and po meds.   Palliative Prophylaxis:   Aspiration, Bowel Regimen, Delirium Protocol, Frequent Pain Assessment, Oral Care and Turn Reposition  Additional Recommendations (Limitations, Scope, Preferences):  Full Scope Treatment  Psycho-social/Spiritual:   Desire for further Chaplaincy support:yes  Additional Recommendations: Caregiving  Support/Resources and Grief/Bereavement Support  Prognosis:   Prognosis poor with extensive MRSA infection as well as liver and renal failure.   Discharge Planning: To Be Determined      Primary Diagnoses: Present on Admission: .  Chronic hepatitis C ([place]) . BIPOLAR AFFECTIVE DISORDER . Esophageal varices ([place]) . Chronic pain syndrome . Hepatic cirrhosis due to chronic hepatitis C infection ([place]) . Leukocytosis . Sepsis ([place]) . Altered mental status . Lower urinary tract infectious disease . Hemoptysis . Staphylococcal arthritis of left ankle ([place]) . Sepsis due to methicillin resistant Staphylococcus aureus ([place]) . Mass of chest wall, right   I have reviewed the medical record, interviewed the patient and family, and examined the patient. The following aspects are pertinent.  Past Medical History:  Diagnosis Date  . ADHD 09/23/2006  . Allergic rhinitis   . Anxiety   . Benign prostatic hypertrophy   . Bipolar 1 disorder  ([place])   . Chronic pain syndrome 01/18/2009  . 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 Depression   . Diabetes mellitus without complication ([place])   . Diverticulosis of colon   . ESOPHAGEAL VARICES 04/19/2009  . ETOH abuse    hx  . GERD 09/23/2006  . Hepatitis C   . Hyperlipidemia   . INSOMNIA-SLEEP DISORDER-UNSPEC 04/19/2009  . Low back pain    chronic  . Migraine   . MRSA (methicillin resistant staph aureus) culture positive   . NEPHROLITHIASIS, HX OF 11/02/2007  . Seizure disorder ([place])    H/O  . Sinusitis    recurrent   Social History   Socioeconomic History  . Marital status: Married    Spouse name: None  . Number of children: 3  . Years of education: None  . Highest education level: None  Social Needs  . Financial resource strain: None  . Food insecurity - worry: None  . Food insecurity - inability: None  . Transportation needs - medical: None  . Transportation needs - non-medical: None  Occupational History  . Occupation: used to work for  [employer], now disabled due to back pain    Employer: UNEMPLOYED  Tobacco Use  . Smoking status: Former Research scientist (life sciences)  . Smokeless tobacco: Never Used  Substance and Sexual Activity  . Alcohol use: No  . Drug use: No  . Sexual activity: Not Currently  Other Topics Concern  . None  Social History Narrative  . None   Family History  Problem Relation Age of Onset  . Dementia Father   . Stroke Mother   . Hyperlipidemia Mother   . Depression Mother   . Hypertension Mother   . Asthma Daughter   . Diabetes Neg Hx    Scheduled Meds: . aspirin EC  81 mg Oral Daily  . furosemide  40 mg Intravenous Q12H  . gabapentin  600 mg Oral BID  . insulin aspart  0-15 Units Subcutaneous TID WC  . insulin aspart  0-5 Units Subcutaneous QHS  . insulin detemir  15 Units Subcutaneous QHS  . lactulose  30 g Oral TID  . multivitamin with minerals  1 tablet Oral Daily  . pantoprazole (PROTONIX) IV  40 mg Intravenous Q12H  .  phytonadione  10 mg Subcutaneous Daily  . propranolol  20 mg Oral BID  . rifaximin  550 mg Oral BID  . sodium bicarbonate  25 mEq Intravenous Once  . sodium chloride flush  3 mL Intravenous Q12H  . sodium polystyrene  45 g Rectal Once  . tamsulosin  0.4 mg Oral Daily  . venlafaxine  150 mg Oral Q breakfast  . venlafaxine  75 mg Oral QHS   Continuous Infusions: . sodium chloride 250 mL (03/30/17 2300)  . ceFTAROline (  TEFLARO) IV Stopped (03/30/17 2301)  .  sodium bicarbonate (isotonic) infusion in sterile water 75 mL/hr at 03/11/2017 0921   PRN Meds:.sodium chloride, albuterol, gadobenate dimeglumine, guaiFENesin-dextromethorphan, LORazepam, metoCLOPramide **OR** metoCLOPramide (REGLAN) injection, metoprolol tartrate, morphine injection, ondansetron **OR** ondansetron (ZOFRAN) IV, opium-belladonna, oxyCODONE, sodium chloride flush Allergies  Allergen Reactions  . Duloxetine     REACTION: memory dysfunction, dizziness  . Latex Other (See Comments)    Reaction unknown  . Levofloxacin Nausea Only  . Mercury Other (See Comments)    Reaction unknown  . Sulfonamide Derivatives Other (See Comments)    Reaction unknown   Review of Systems  Unable to perform ROS: Acuity of condition    Physical Exam  Constitutional: He appears well-developed. He has a sickly appearance.  HENT:  Head: Normocephalic and atraumatic.  Cardiovascular: Normal rate and regular rhythm.  Pulmonary/Chest: Effort normal. No accessory muscle usage. No tachypnea. No respiratory distress.  Abdominal: He exhibits distension and ascites.  Neurological: He is unresponsive.  Skin:  Jaundice   Nursing note and vitals reviewed.   Vital Signs: BP 138/76 (BP Location: Right Arm)   Pulse 89   Temp (!) 97.2 F (36.2 C) (Axillary)   Resp 13   Ht 6' 3" (1.905 m)   Wt 108.8 kg (239 lb 13.8 oz)   SpO2 97%   BMI 29.98 kg/m  Pain Assessment: No/denies pain   Pain Score: Asleep   SpO2: SpO2: 97 % O2 Device:SpO2: 97  % O2 Flow Rate: .O2 Flow Rate (L/min): 2 L/min  IO: Intake/output summary:   Intake/Output Summary (Last 24 hours) at 03/22/2017 1327 Last data filed at 03/16/2017 0600 Gross per 24 hour  Intake 320 ml  Output 500 ml  Net -180 ml    LBM: Last BM Date: 03/29/17 Baseline Weight: Weight: 103.4 kg (228 lb) Most recent weight: Weight: 108.8 kg (239 lb 13.8 oz)     Palliative Assessment/Data:    Time Total: 70 min  Greater than 50%  of this time was spent counseling and coordinating care related to the above assessment and plan.  Signed by: Vinie Sill, NP Palliative Medicine Team Pager # [phone] (M-F 8a-5p) Team Phone # [phone] (Nights/Weekends)

## 2017-03-31 NOTE — Progress Notes (Signed)
CRITICAL VALUE ALERT  Critical Value:  K+6.3  Date & Time Notied:  03/04/2017 0515  Provider Notified: Paged hospitalist  Orders Received/Actions taken: awaiting orders

## 2017-03-31 NOTE — Progress Notes (Signed)
PT Cancellation Note  Patient Details Name: Kurt Reid MRN: [id number] DOB: November 26, 1950   Cancelled Treatment:     PT deferred this date at request of RN 2* pt condition - very confused.  Will follow.   Arwin Bisceglia 03/04/2017, 8:38 AM

## 2017-03-31 NOTE — Progress Notes (Signed)
Initial Nutrition Assessment  DOCUMENTATION CODES:   Not applicable  INTERVENTION:  - Diet advancement, if medically feasible and pt alert/awake enough to safely take items PO. - Will monitor for GOC/POC.  NUTRITION DIAGNOSIS:   Inadequate oral intake related to inability to eat as evidenced by NPO status.  GOAL:   Patient will meet greater than or equal to 90% of their needs  MONITOR:   Diet advancement, Weight trends, Labs, Skin, I & O's  REASON FOR ASSESSMENT:   LOS(10 days)  ASSESSMENT:   67 year old male with history of liver cirrhosis secondary to hepatitis C, diabetes, bipolar disorder sent by PCP for evaluation of abdominal pain and confusion.  Patient was admitted with leukocytosis probably from UTI and started on antibiotics.  He was found to have MRSA bacteremia.  ID was consulted. Patient also noted to have a septic arthritis status post irrigation and debridement per orthopedics. Cultures growing MRSA. He was noted to have a pulmonary process invading through the rib concerning for malignant process versus abscess formation.  Cardiothoracic surgery consulted.  BMI indicates borderline obesity. Pt has been NPO many times throughout hospitalization. Most recent intakes documented include 50% of lunch on 1/26 and 25% of breakfast and lunch on 1/22; he was on Heart Healthy diet on both of these dates. Weight has been slightly fluctuating since admission but not enough to be of concern. Pt noted to be a/o to self only. Did not enter pt's room as curtain was pulled and Dr. Ardis Hughs' (GI) note from 9:00 AM indicates that pt was not answering his questions. Pt currently NPO (since midnight) with consult in place for ultrasound-guided paracentesis.   Notes from different services indicate that pt has a poor prognosis with worsening renal function and worsening cirrhosis. Plan in place for Palliative consult. Will monitor for GOC/POC and provide intervention (if warranted) based on  these decisions.   Medications reviewed; 40 mg IV Lasix BID, sliding scale Novolog, 10 units Novolog/day, 15 units Levemir/day, 30 g lactulose TID, daily multivitamin with minerals, 40 mg IV Protonix BID, 10 mg subcutaneous vitamin K/day, 25 mEq IV sodium bicarb x1 today, 45 g oral Kayexalate x1 today.  Labs reviewed; CBG: 203 mg/dL this AM, K: 6.3 mmol/L, Cl: 113 mmol/L, BUN: 89 mg/dL, creatinine: 2.58 mg/dL, Ca: 7.7 mg/dL, Mg: 2.5 mg/dL, Alk Phos elevated, LFTs elevated, GFR: 24 mL/min.  IVF: 150 mEq sodium bicarb in sterile water @ 75 mL/hr.     NUTRITION - FOCUSED PHYSICAL EXAM:  Unable to complete/assess at this time; will attempt at follow-up (if appropriate at that time).  Diet Order:  Diet NPO time specified Except for: Sips with Meds  EDUCATION NEEDS:   No education needs have been identified at this time  Skin:  Skin Assessment: Reviewed RN Assessment  Last BM:  1/26  Height:   Ht Readings from Last 1 Encounters:  03/30/17 '6\' 3"'  (1.905 m)    Weight:   Wt Readings from Last 1 Encounters:  03/30/17 239 lb 13.8 oz (108.8 kg)    Ideal Body Weight:  89.09 kg  BMI:  Body mass index is 29.98 kg/m.  Estimated Nutritional Needs:   Kcal:  3845-3646 (20-22 kcal/kg)  Protein:  130-140 grams (1.2-1.3 grams/kg)  Fluid:  >/= 2.2 L/day      Jarome Matin, MS, RD, LDN, [facility] Inpatient Clinical Dietitian Pager # [phone] After hours/weekend pager # [phone]

## 2017-03-31 NOTE — Progress Notes (Signed)
[facility] Progress Note    Since last GI note: He is sleeping, moaning a bit.  Responding to voice, pain but not answering questions.  Objective: Vital signs in last 24 hours: Temp:  [97 F (36.1 C)-98.9 F (37.2 C)] 97.6 F (36.4 C) (01/28 0800) Pulse Rate:  [88-109] 89 (01/28 0800) Resp:  [12-21] 13 (01/28 0800) BP: (117-155)/(54-130) 138/76 (01/28 0800) SpO2:  [92 %-100 %] 97 % (01/28 0800) Weight:  [239 lb 13.8 oz (108.8 kg)] 239 lb 13.8 oz (108.8 kg) (01/27 1422) Last BM Date: 03/29/17 General: alert and oriented times zero Heart: regular rate and rythm Abdomen: soft, non-tender, non-distended, normal bowel sounds   Lab Results: Recent Labs    03/29/17 0632 03/30/17 0552 03/21/2017 0327  WBC 31.2* 26.2* 26.4*  HGB 12.7* 12.4* 12.4*  PLT 217 182 159  MCV 90.8 91.5 92.3   Recent Labs    03/29/17 0632 03/30/17 0552 03/30/17 0924 03/30/17 1631 03/06/2017 0327  NA 131* 137  --   --  136  K 5.2* 5.5* 6.0* 6.0* 6.3*  CL 107 113*  --   --  113*  CO2 17* 17*  --   --  16*  GLUCOSE 160* 154*  --   --  233*  BUN 62* 74*  --   --  89*  CREATININE 1.71* 2.20*  --   --  2.58*  CALCIUM 7.6* 7.8*  --   --  7.7*   Recent Labs    03/30/17 0552 03/24/2017 0327  PROT 6.8 6.4*  ALBUMIN 1.6* 1.4*  AST 152* 263*  ALT 60 88*  ALKPHOS 252* 231*  BILITOT 11.7* 14.0*  BILIDIR 6.6* 7.1*  IBILI 5.1* 6.9*   Recent Labs    03/30/17 0924 03/17/2017 0327  INR 1.71 1.92     Studies/Results: US Renal  Result Date: 03/30/2017 CLINICAL DATA:  Acute renal failure EXAM: RENAL / URINARY TRACT ULTRASOUND COMPLETE COMPARISON:  03/07/2017. FINDINGS: Right Kidney: Length: 11.3 cm. Scattered nonobstructing renal calculi are noted similar to that seen on prior CT examination. Left Kidney: Length: 11.3 cm. Scattered nonobstructing renal stones are noted. Known cystic lesion seen on recent CT is not as well appreciated on today's exam. Bladder: Not well distended. IMPRESSION:  Bilateral nonobstructing renal stones similar to that seen on prior CT. Previously noted left renal cyst is not well appreciated on this exam. Electronically Signed   By: Alcide Clever M.D.   On: 03/30/2017 10:30     Medications: Scheduled Meds: . aspirin EC  81 mg Oral Daily  . dextrose  1 ampule Intravenous Once  . furosemide  40 mg Intravenous Q12H  . gabapentin  600 mg Oral BID  . insulin aspart  0-15 Units Subcutaneous TID WC  . insulin aspart  0-5 Units Subcutaneous QHS  . insulin aspart  10 Units Subcutaneous Once  . insulin detemir  15 Units Subcutaneous QHS  . lactulose  30 g Oral TID  . multivitamin with minerals  1 tablet Oral Daily  . pantoprazole (PROTONIX) IV  40 mg Intravenous Q12H  . propranolol  20 mg Oral BID  . rifaximin  550 mg Oral BID  . sodium bicarbonate  25 mEq Intravenous Once  . sodium chloride flush  3 mL Intravenous Q12H  . sodium polystyrene  45 g Oral Once  . tamsulosin  0.4 mg Oral Daily  . venlafaxine  150 mg Oral Q breakfast  . venlafaxine  75 mg Oral QHS   Continuous  Infusions: . sodium chloride 250 mL (03/30/17 2300)  . albumin human    . ceFTAROline (TEFLARO) IV Stopped (03/30/17 2301)  .  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  His MELD score is 33 today.  This is the first I am meeting him but his mental status is poor, obvious HE despite xifaxin, lactulose.  His renal function is worsening.  ID note yesterday describes 50% mortality of metastatic MRSA.  With decompensating cirrhosis and ARI on top of the MRSA, his  prognosis is very poor.  Will order vit K times three days.  I recommended the wife consider how she would like the medical team to handle respiratory failure, cardiopulm events should they occur.  I agree with palliative care consultation.    Rachael Feeaniel P Bryah [place], MD  03/10/2017, 9:03 AM [facility] Pager [phone]

## 2017-03-31 NOTE — Progress Notes (Signed)
[facility]    Date of Admission:  June 06, 2017   Total days of antibiotics 11        Day 2 ceftaroline           ID: Kurt Reid is a [age] y.o. male with disseminated MRSA infection including bacteremia, septic arthritis, +/- pneumonia/abscess who is developing worsening Hepatorenal syndrome Principal Problem:   Sepsis due to methicillin resistant Staphylococcus aureus (HCC) Active Problems:   BIPOLAR AFFECTIVE DISORDER   Chronic pain syndrome   Esophageal varices (HCC)   Goals of care, counseling/discussion   Hepatic cirrhosis due to chronic hepatitis C infection (HCC)   Hemoptysis   DM type 2 without retinopathy (HCC)   Chronic hepatitis C (HCC)   Leukocytosis   Sepsis (HCC)   Altered mental status   Lower urinary tract infectious disease   Delirium due to another medical condition   Ankle pain, left   Left ankle effusion   Hematemesis   Acute urinary retention   Staphylococcal arthritis of left ankle (HCC)   Abscess of upper lobe of right lung with pneumonia (HCC)   Hyperkalemia   Hyperbilirubinemia   ARF (acute renal failure) (HCC)   Ascites   Mass of chest wall, right   Subcutaneous emphysema (HCC)   Acute liver failure with hepatic coma (HCC)   Palliative care encounter    Subjective: Afebrile, returned from IR who sampled chest wall fluid collection c/w hematoma as well as did 2L paracentesis  Patient status remains unchange, encephalopathic not following commands, not opening eyes but moves arms spontaneously  ROS: unable to obtain due to altered mental status  Medications:  . aspirin EC  81 mg Oral Daily  . furosemide  40 mg Intravenous Q12H  . insulin aspart  0-15 Units Subcutaneous TID WC  . insulin aspart  0-5 Units Subcutaneous QHS  . insulin detemir  15 Units Subcutaneous QHS  . lactulose  30 g Oral TID  . lidocaine      . multivitamin with minerals  1 tablet Oral Daily  . pantoprazole (PROTONIX) IV  40 mg  Intravenous Q12H  . phytonadione  10 mg Subcutaneous Daily  . propranolol  20 mg Oral BID  . rifaximin  550 mg Oral BID  . sodium bicarbonate  25 mEq Intravenous Once  . sodium chloride flush  3 mL Intravenous Q12H  . sodium polystyrene  45 g Rectal Once  . tamsulosin  0.4 mg Oral Daily  . venlafaxine  150 mg Oral Q breakfast  . venlafaxine  75 mg Oral QHS    Objective: Vital signs in last 24 hours: Temp:  [97 F (36.1 C)-98.9 F (37.2 C)] 97.2 F (36.2 C) (01/28 1200) Pulse Rate:  [86-108] 88 (01/28 1602) Resp:  [12-21] 16 (01/28 1602) BP: (117-155)/(54-130) 130/74 (01/28 1602) SpO2:  [96 %-100 %] 100 % (01/28 1602) Physical Exam  Constitutional: He is markedly jaundiced, ill-appearing with oxygen supplementation No distress.  HENT:  Mouth/Throat: Oropharynx is dry Cardiovascular: Normal rate, regular rhythm and normal heart sounds. Exam reveals no gallop and no friction rub.  No murmur heard.  Pulmonary/Chest: Effort normal and breath sounds normal. No respiratory distress. He has no wheezes.  Abdominal: Soft. Bowel sounds are normal. He has only mild distension. There is no tenderness.  Ext: +2 edema lower extremities. Left ankle wrapped from surgery Skin: Skin is warm and dry. No rash noted. No erythema.       Lab Results  Recent Labs    03/30/17 0552  04/01/2017 0327 03/04/2017 1725  WBC 26.2*  --  26.4*  --   HGB 12.4*  --  12.4*  --   HCT 36.5*  --  36.9*  --   NA 137  --  136 138  K 5.5*   < > 6.3* 5.3*  CL 113*  --  113* 112*  CO2 17*  --  16* 18*  BUN 74*  --  89* 90*  CREATININE 2.20*  --  2.58* 2.96*   < > = values in this interval not displayed.   Liver Panel Recent Labs    03/30/17 0552 03/20/2017 0327 03/11/2017 1725  PROT 6.8 6.4*  --   ALBUMIN 1.6* 1.4* 1.4*  AST 152* 263*  --   ALT 60 88*  --   ALKPHOS 252* 231*  --   BILITOT 11.7* 14.0*  --   BILIDIR 6.6* 7.1*  --   IBILI 5.1* 6.9*  --     Microbiology: 1/23 blood cx ngtd 1/25 ankle  aspirate MRSA Studies/Results: US Renal  Result Date: 03/30/2017 CLINICAL DATA:  Acute renal failure EXAM: RENAL / URINARY TRACT ULTRASOUND COMPLETE COMPARISON:  03/20/2017. FINDINGS: Right Kidney: Length: 11.3 cm. Scattered nonobstructing renal calculi are noted similar to that seen on prior CT examination. Left Kidney: Length: 11.3 cm. Scattered nonobstructing renal stones are noted. Known cystic lesion seen on recent CT is not as well appreciated on today's exam. Bladder: Not well distended. IMPRESSION: Bilateral nonobstructing renal stones similar to that seen on prior CT. Previously noted left renal cyst is not well appreciated on this exam. Electronically Signed   By: Alcide Clever M.D.   On: 03/30/2017 10:30   Ct Biopsy  Result Date: 03/09/2017 INDICATION: Right anterior chest wall hematoma versus mass or abscess EXAM: CT-GUIDED BIOPSY RIGHT CHEST WALL ABNORMALITY MEDICATIONS: 1% lidocaine local ANESTHESIA/SEDATION: Moderate Sedation Time:  None. The patient was continuously monitored during the procedure by the interventional radiology nurse under my direct supervision. PROCEDURE: The procedure, risks, benefits, and alternatives were explained to the patient. Questions regarding the procedure were encouraged and answered. The patient understands and consents to the procedure. Previous imaging reviewed. Patient positioned supine. Noncontrast localization CT performed to demonstrate the right anterior chest wall mass/abnormality. Overlying skin marked for a medial approach. Under sterile conditions and local anesthesia, a 17 gauge coaxial guide needle was advanced from an anterior oblique approach into the chest wall abnormality. Needle position confirmed with CT. Syringe aspiration yielded dark bloody fluid. Attempts were made core biopsy. Sampling was scant and low yield. Of note, there is an adjacent acute right anterior rib fracture. Because of the poor sampling, chest wall hematoma is favored. Air  within the soft tissue abnormality may be secondary to adjacent lung injury. Patient tolerated the procedure well without complication. Vital sign monitoring by nursing staff during the procedure will continue as patient is in the special procedures unit for post procedure observation. FINDINGS: The images document guide needle placement within the right chest wall abnormality. Post biopsy images demonstrate no significant interval change. COMPLICATIONS: None immediate. IMPRESSION: Successful CT-guided aspiration and attempted core biopsy of the right chest wall abnormality. Sample sent for pathology, cytology, and culture. Chest wall hematoma is favored. Electronically Signed   By: Judie Petit.  Shick M.D.   On: 03/13/2017 16:40   Ir Paracentesis  Result Date: 03/14/2017 INDICATION: Cirrhosis, ascites, abdominal distension EXAM: ULTRASOUND GUIDED PARACENTESIS MEDICATIONS: 1% lidocaine local COMPLICATIONS: None immediate. PROCEDURE:  An ultrasound guided paracentesis was thoroughly discussed with the patient and questions answered. The benefits, risks, alternatives and complications were also discussed. The patient understands and wishes to proceed with the procedure. Written consent was obtained. Ultrasound was performed to localize and mark an adequate pocket of fluid in the right upper quadrant of the abdomen. The area was then prepped and draped in the normal sterile fashion. 1% Lidocaine was used for local anesthesia. Under ultrasound guidance a Safe-T-Centesis needle catheter was introduced. Paracentesis was performed. The catheter was removed and a dressing applied. FINDINGS: A total of approximately 2 L of clear ascitic fluid was removed. A fluid sample was sent for laboratory analysis. IMPRESSION: Successful ultrasound guided paracentesis yielding 2 L of ascites. Electronically Signed   By: Judie Petit.  Shick M.D.   On: 03/22/2017 16:52     Assessment/Plan: Disseminated MRSA infection = continue on ceftaroline,  renally dosed. He is likely still therapeutic from vancomycin (vanco random level of 36 on 1/27) would treat bacteremia, septic arthritis. Unclear if he has pulmonary process now since CT guided aspiration suggests it is a hematoma  AKI = likely combination of vanco toxicity, HRS, patient was seen by nephrology who offered doing short-term CRRT to see if any improvement in his overall function. It would clear electrolyte imbalance and vancomycin but he likely still has element of hepato-renal syndrome and fluid shifts difficult to treat  Hepatic encephalopathy = currently on rifaximin and lactulose   Hyperbilirubinemia = Tbili at 14 with high MELD of 33 , overall poor prognosis event with some minor improvement of kidney function  Overall prognosis is poor and likely lethal given he has both worsening renal function, liver function. I think CRRT may possibly get the patient to be more lucid but difficult to tell- not sure if it would provide meaningful recovery of kidneys ..where he would still need improvement from liver function  Appreciate palliative care team  [facility] Cell: [phone] Pager: [phone]  03/20/2017, 6:27 PM

## 2017-03-31 NOTE — Op Note (Signed)
NAME:  Cathie HoopsJONES, Arjen                    ACCOUNT NO.:  MEDICAL RECORD NO.:  [id number]  LOCATION:                                 FACILITY:  PHYSICIAN:  Burnard BuntingG. Scott Teriyah Purington, M.D.    DATE OF BIRTH:  28-Dec-1950  DATE OF PROCEDURE: DATE OF DISCHARGE:                              OPERATIVE REPORT   PREOPERATIVE DIAGNOSIS:  Left ankle infection.  POSTOPERATIVE DIAGNOSIS:  Left ankle infection.  PROCEDURE:  Left ankle arthroscopic I and D.  SURGEON:  Burnard BuntingG. Scott Shelton Soler, M.D.  ASSISTANT:  None.  INDICATIONS:  Kurt Reid is a 67 year old patient with multiple medical problems, who was noted to have a left ankle effusion in the setting of unexplained sepsis.  Aspiration of the ankle demonstrated rare staphylococcus.  Presents now for operative management.  PROCEDURE IN DETAIL:  The patient was brought to the operating room, where general anesthetic was induced.  Preoperative antibiotics administered were continued.  Left ankle was prepped with Hibiclens and draped in a sterile manner.  Time-out was called.  Saline 20 mL was injected into the ankle joint.  Anterior medial portal was created just medial to the anterior tib tendon.  Switching stick was then used to go across the joint into an anterolateral portal, which was just anterior to the fibula.  With 2 portals created, arthroscopy was utilized to irrigate the joint with approximately 4000 mL of fluid.  Cultures were obtained prior to irrigation.  Following this, a Penrose drain was placed into the anterior medial portal.  Portals were then closed.  A solution of saline with vancomycin powder was then injected into the ankle joint to add an infection eradication.  Ankle was then wrapped in a bulky dressing.  The patient tolerated the procedure well without complications.  Plan is for ankle drain removal tomorrow.     Burnard BuntingG. Scott Emmerie Battaglia, M.D.     GSD/MEDQ  D:  03/07/2017  T:  04/02/2017  Job:  [id number]

## 2017-03-31 NOTE — Consult Note (Signed)
[facility] Consultation Note Requesting Physician:  Dr. Janee Morn Reason for Consult:  Acute renal failure, hyperkalemia  HPI: The patient is a 67 y.o. year-old male with PMH cirrhosis 2/2 hep C with protal hypertension, esophageal varices, DM, bipolar disorder. Admitted 1/18 with leukocytosis, hepatic encephalopathy, found to have metastatic MRSA infection with bacteremia, septic L ankle arthritis, urine also +. Vanco started 1/19. Over weekend developed hemoptysis and new RUL infiltrate. Chest CT RUL masslike lesion infiltrating into the rib and muscle.  Noted to have rising creatinine early in the admission, peaked at 1.59 and was improving (down to creatinine of 1.18 on 1/25). Started rising again on 1/26 with low UOP and did not respond to IV fluids. Vanco trough noted to be 45 on 03/29/17 and vanco has been held. Cirrhosis is now decompensated and LFT's worsening, ^ abd distension. Lasix started 1/27. Aldactone stopped 2/2 hyperkalemia.. Foley replaced for UOP monitoring. Oliguric AKI. Kayexalate yesterday orally, ordered rectally today, not given yet  Vanco levels  1/21 trough 16, peak 36 1/22 trough 18 1/26 trough 45 1/27 random 36  No hypotension, no NSAID; IV contrast on 1/18 w/CT.   Creatinine, Ser  Date/Time Value Ref Range Status  Apr 26, 2017 03:27 AM 2.58 (H) 0.61 - 1.24 mg/dL Final  16/12/9602 54:09 AM 2.20 (H) 0.61 - 1.24 mg/dL Final  81/19/1478 29:56 AM 1.71 (H) 0.61 - 1.24 mg/dL Final  21/30/8657 84:69 AM 1.18 0.61 - 1.24 mg/dL Final  62/95/2841 32:44 AM 1.30 (H) 0.61 - 1.24 mg/dL Final  03/06/7251 66:44 AM 1.32 (H) 0.61 - 1.24 mg/dL Final  03/47/4259 56:38 AM 1.32 (H) 0.61 - 1.24 mg/dL Final  75/64/3329 51:88 AM 1.50 (H) 0.61 - 1.24 mg/dL Final  41/66/0630 16:01 AM 1.59 (H) 0.61 - 1.24 mg/dL Final  09/32/3557 32:20 AM 1.41 (H) 0.61 - 1.24 mg/dL Final  25/42/7062 37:62 AM 1.15 0.61 - 1.24 mg/dL Final  83/15/1761 60:73 PM 0.87 0.40 - 1.50 mg/dL Final  71/08/2692 85:46 PM 0.83 0.40  - 1.50 mg/dL Final  27/05/5007 38:18 PM 0.97 0.40 - 1.50 mg/dL Final  29/93/7169 67:89 AM 0.79 0.40 - 1.50 mg/dL Final    Past Medical History:  Diagnosis Date  . ADHD 09/23/2006  . Allergic rhinitis   . Anxiety   . Benign prostatic hypertrophy   . Bipolar 1 disorder (HCC)   . Chronic pain syndrome 01/18/2009  . 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 Depression   . Diabetes mellitus without complication (HCC)   . Diverticulosis of colon   . ESOPHAGEAL VARICES 04/19/2009  . ETOH abuse    hx  . GERD 09/23/2006  . Hepatitis C   . Hyperlipidemia   . INSOMNIA-SLEEP DISORDER-UNSPEC 04/19/2009  . Low back pain    chronic  . Migraine   . MRSA (methicillin resistant staph aureus) culture positive   . NEPHROLITHIASIS, HX OF 11/02/2007  . Seizure disorder (HCC)    H/O  . Sinusitis    recurrent    Past Surgical History:  Procedure Laterality Date  . back surgury     x 5  . cheekbone     hx of fx  . I&D EXTREMITY Right 12/10/2013   Procedure: IRRIGATION AND DEBRIDEMENT OF MULTIPLE DOG BITES RIGHT HAND AND WRIST;  Surgeon: Dominica Severin, MD;  Location: [facility] OR;  Service: Orthopedics;  Laterality: Right;  . IR US GUIDE BX ASP/DRAIN  03/26/2017  . KNEE ARTHROSCOPY Left 04/02/2017   Procedure: IRRIGATION AND DEBRIDIMENT LEFT ANKLE;  Surgeon:  Cammy Copa, MD;  Location: [facility] ORS;  Service: Orthopedics;  Laterality: Left;  . knee surgury     x 1  . s/p sinus surgury  2010   Dr. Zara Chess  . TONSILLECTOMY      Family History  Problem Relation Age of Onset  . Dementia Father   . Stroke Mother   . Hyperlipidemia Mother   . Depression Mother   . Hypertension Mother   . Asthma Daughter   . Diabetes Neg Hx    Social History:  reports that he has quit smoking. he has never used smokeless tobacco. He reports that he does not drink alcohol or use drugs.   Allergies  Allergen Reactions  . Duloxetine     REACTION: memory dysfunction, dizziness  . Latex Other (See  Comments)    Reaction unknown  . Levofloxacin Nausea Only  . Mercury Other (See Comments)    Reaction unknown  . Sulfonamide Derivatives Other (See Comments)    Reaction unknown    Home medications: Prior to Admission medications   Medication Sig Start Date End Date Taking? Authorizing Provider  aspirin (ASPIRIN EC) 81 MG EC tablet Take 81 mg by mouth daily.     Yes [provider]  gabapentin (NEURONTIN) 300 MG capsule TAKE 2 CAPSULES BY MOUTH TWICE DAILY 03/05/17  Yes [name], MD  HYDROcodone-acetaminophen [facility]) 10-325 MG per tablet Take 1 tablet by mouth 2 (two) times daily as needed for severe pain.    Yes [provider]  Insulin NPH Isophane & Regular (RELION 70/30 [place]) Inject into the skin. Sliding scale Dr. Lilli Light   Yes [provider]  omeprazole (PRILOSEC) 20 MG capsule Take 1 capsule (20 mg total) by mouth daily. 06/11/16  Yes [name], MD  propranolol (INDERAL) 20 MG tablet TAKE 1 TABLET BY MOUTH 2 TIMES DAILY 07/15/16  Yes [name], MD  spironolactone (ALDACTONE) 50 MG tablet Take 50 mg by mouth 1 day or 1 dose. 07/15/16  Yes [provider]  venlafaxine (EFFEXOR) 75 MG tablet TAKE 1 TABLET BY MOUTH 3 TIMES DAILY WITH MEALS 07/15/16  Yes [name], MD  B-D UF III MINI PEN NEEDLES 31G X 5 MM MISC USE AS DIRECTED ONCE DAILY    [name], MD  chlorhexidine (HIBICLENS) 4 % external liquid Apply topically as directed. Bathe 3x/week. Avoid face and genitalia Patient not taking: Reported on 04/01/2017 07/23/16   Nche, Bonna Gains, NP  desvenlafaxine (PRISTIQ) 50 MG 24 hr tablet Take 1 tablet (50 mg total) by mouth daily. Patient not taking: Reported on 03/11/2017 05/22/16   [name], MD  fluticasone [facility]) 50 MCG/ACT nasal spray PLACE 2 SPRAYS INTO BOTH NOSTRILS DAILY. Patient not taking: Reported on 03/28/2017 02/13/15   [name], MD  glucose blood (ONE TOUCH ULTRA TEST) test strip USE AS DIRECTED THREE TIMES DAILY  04/27/15   [name], MD  Lancets Misc. (ONE [facility] SURESOFT) MISC Use as directed once per day 11/10/12   [name], MD  metFORMIN (GLUCOPHAGE-XR) 500 MG 24 hr tablet Take 2 tablets (1,000 mg total) by mouth daily with breakfast. Patient not taking: Reported on 03/11/2017 05/24/16   [name], MD  RELION INSULIN SYRINGE 1ML/31G 31G X 5/16" 1 ML MISC  05/31/16   [provider]  triamcinolone (NASACORT AQ) 55 MCG/ACT AERO nasal inhaler Place 2 sprays into the nose daily. Patient not taking: Reported on 03/08/2017  05/22/16   [name], MD    Inpatient medications: . aspirin EC  81 mg Oral Daily  . furosemide  40 mg Intravenous Q12H  . gabapentin  600 mg Oral BID  . insulin aspart  0-15 Units Subcutaneous TID WC  . insulin aspart  0-5 Units Subcutaneous QHS  . insulin detemir  15 Units Subcutaneous QHS  . lactulose  30 g Oral TID  . multivitamin with minerals  1 tablet Oral Daily  . pantoprazole (PROTONIX) IV  40 mg Intravenous Q12H  . phytonadione  10 mg Subcutaneous Daily  . propranolol  20 mg Oral BID  . rifaximin  550 mg Oral BID  . sodium bicarbonate  25 mEq Intravenous Once  . sodium chloride flush  3 mL Intravenous Q12H  . sodium polystyrene  45 g Rectal Once  . tamsulosin  0.4 mg Oral Daily  . venlafaxine  150 mg Oral Q breakfast  . venlafaxine  75 mg Oral QHS    Review of Systems Unobtainable  Physical Exam:  Blood pressure 138/76, pulse 89, temperature (!) 97.2 F (36.2 C), temperature source Axillary, resp. rate 13, height 6\' 3"  (1.905 m), weight 108.8 kg (239 lb 13.8 oz), SpO2 97 %.  YQM:[id number], jaundiced Lines/tubes: foley replaced Chest: Crackles, lateral chest wall  Normal heart sounds S1S2 No S3 Distended abdomen, no focal tenderness 2+ edema LE's to hips  Labs: Renal Panel: Recent Labs  Lab 03/26/2017 0730 03/26/17 0617 03/27/17 0631 03/14/2017 0600 03/29/17 0632 03/30/17 0552 03/30/17 0924 03/30/17 1631 03/24/2017 0327  NA  133* 134* 134* 136 131* 137  --   --  136  K 4.5 4.3 4.8 5.0 5.2* 5.5* 6.0* 6.0* 6.3*  CL 108 107 109 108 107 113*  --   --  113*  CO2 21* 22 20* 21* 17* 17*  --   --  16*  GLUCOSE 276* 171* 174* 150* 160* 154*  --   --  233*  BUN 44* 42* 48* 52* 62* 74*  --   --  89*  CREATININE 1.32* 1.32* 1.30* 1.18 1.71* 2.20*  --   --  2.58*  CALCIUM 8.1* 8.1* 8.0* 8.0* 7.6* 7.8*  --   --  7.7*    Recent Labs  Lab 03/09/2017 0600 03/30/17 0552 03/06/2017 0327  AST 123* 152* 263*  ALT 55 60 88*  ALKPHOS 296* 252* 231*  BILITOT 9.2* 11.7* 14.0*  PROT 6.5 6.8 6.4*  ALBUMIN 1.5* 1.6* 1.4*    Recent Labs  Lab 03/30/17 1631  AMMONIA 82*   CBC: Recent Labs  Lab 03/22/2017 0600 03/29/17 0632 03/30/17 0552 03/20/2017 0327  WBC 29.1* 31.2* 26.2* 26.4*  NEUTROABS 25.9* 27.1* 21.7* 22.4*  HGB 12.5* 12.7* 12.4* 12.4*  HCT 37.2* 37.5* 36.5* 36.9*  MCV 90.7 90.8 91.5 92.3  PLT 160 217 182 159    Recent Labs  Lab 03/30/17 1608 03/30/17 2117 03/05/2017 0732 03/12/2017 1125 03/10/2017 1432  GLUCAP 191* 237* 203* 239* 194*    Xrays/Other Studies: US Renal  Result Date: 03/30/2017 CLINICAL DATA:  Acute renal failure EXAM: RENAL / URINARY TRACT ULTRASOUND COMPLETE COMPARISON:  03/04/2017. FINDINGS: Right Kidney: Length: 11.3 cm. Scattered nonobstructing renal calculi are noted similar to that seen on prior CT examination. Left Kidney: Length: 11.3 cm. Scattered nonobstructing renal stones are noted. Known cystic lesion seen on recent CT is not as well appreciated on today's exam. Bladder: Not well distended. IMPRESSION: Bilateral nonobstructing renal stones similar to that seen on  prior CT. Previously noted left renal cyst is not well appreciated on this exam. Electronically Signed   By: Alcide CleverMark  Lukens M.D.   On: 03/30/2017 10:30    Background:   67 y.o. year-old male with PMH cirrhosis 2/2 hep C with protal hypertension, esophageal varices, DM, bipolar disorder. Admitted 1/18 with leukocytosis, hepatic  encephalopathy, found to have metastatic MRSA infection with bacteremia, septic L ankle arthritis, UTI, chest mass R lung abscess/osteo/chest wall infection. Vanco started 1/19.  Noted to have rising creatinine early in the admission, peaked at 1.59 and was improving (down to creatinine of 1.18 on 1/25). Started rising again on 1/26 with low UOP and did not respond to IV fluids. Vanco trough noted to be 45 on 03/29/17 and vanco stopped. Cirrhosis is now decompensated and LFT's worsening, ^ abd distension. Lasix started 1/27. Foley replaced for UOP monitoring. Oliguric AKI. Hyperkalemia and metabolic acidosis.   Assessment/Recommendations  1. AKI - DDX includes HRS, vancomycin toxicity, septic/hemodynamic driven in setting of metastatic MRSA infection. Hyperkalemia and acidosis worsening, minimal UOP. Spoke with all family members about his renal failure, high mortality rate now with renal failure and liver failure on top of MRSA infection.  They will consider RRT (I think appropriate to try in the short run although obviously adding a dialysis catheter to the infection mix not optimal; Rather than transferring to [facility] for HD I think maybe best to have CCM place catheter and do CCRT here (correct metabolic abnls, remove vanco) until we can get a better handle on his survival chances (high mortality w/metastatic MRSA + combined liver and kidney failure). Family will let us know of their decision. If they want to go ahead as CCM to place catheter. CRRT orders signed and held. Repeat renal ordered (for K). With liver and renal failure would d/c neurontin 2. Metastatic MRSA infection 3. Decompensated cirrhosis from Hep C 4. Metabolic acidosis 5. Hyperkalemia   Camille Balynthia Embrie Mikkelsen,  MD [facility] [phone] pager 03/22/2017, 3:01 PM

## 2017-03-31 NOTE — Procedures (Signed)
RT CHEST WALL MASS / ABNORMALITY   CT RT CHEST WALL ASPIRATION AND ATTEMPT TO BX  NO COMP STABLE FULL REPORT IN PACS CX, CYTO, AND PATH SENT  SUSPECT CHEST WALL HEMATOMA WITH ADJ ACUTE RIB FX

## 2017-03-31 NOTE — Progress Notes (Signed)
[id number]/Kamrin Sibley,BSN,RN3,CCM/[phone]:will follow for cm needs none present at time of review.

## 2017-03-31 NOTE — Progress Notes (Addendum)
PROGRESS NOTE    Kurt Reid  OZH:[id number] DOB: May 31, 1950 DOA: 03/19/2017 PCP: [name], MD    Brief Narrative:  67 year old male with history of liver cirrhosis secondary to hepatitis C, diabetes, bipolar disorder sent by PCP for evaluation of abdominal pain and confusion.  Patient was admitted with leukocytosis probably from UTI and started on antibiotics.  He was found to have MRSA bacteremia.  ID was consulted.  Patient also noted to have a septic arthritis status post irrigation and debridement per orthopedics.  Cultures growing MRSA.  Patient also noted to have a pulmonary process invading through the rib concerning for malignant process versus abscess formation.  Cardiothoracic surgery consulted.     Assessment & Plan:   Principal Problem:   Sepsis due to methicillin resistant Staphylococcus aureus (HCC) Active Problems:   BIPOLAR AFFECTIVE DISORDER   Chronic pain syndrome   Esophageal varices (HCC)   Goals of care, counseling/discussion   Hepatic cirrhosis due to chronic hepatitis C infection (HCC)   Hemoptysis   DM type 2 without retinopathy (HCC)   Chronic hepatitis C (HCC)   Leukocytosis   Sepsis (HCC)   Altered mental status   Lower urinary tract infectious disease   Delirium due to another medical condition   Ankle pain, left   Left ankle effusion   Hematemesis   Acute urinary retention   Staphylococcal arthritis of left ankle (HCC)   Abscess of upper lobe of right lung with pneumonia (HCC)   Hyperkalemia   Hyperbilirubinemia   ARF (acute renal failure) (HCC)   Ascites   Mass of chest wall, right   Subcutaneous emphysema (HCC)   Acute liver failure with hepatic coma (HCC)  Sepsis secondary to MRSA bacteremia -Questionable cause. Likely seeded from ankle. Repeat blood cultures from 03/23/2017 positive.  Blood cultures from 03/26/2017 still negative. ID following.  2D echo negative for vegetation.  Patient was to have a TEE on 03/26/2017 however  patient refused.  Patient has been seen by psychiatry and lacks capacity at this time.  RN spoke with family who will be fine signing consent for TEE.  Cardiology was consulted to see if TEE could be rearranged.  Spoke with the card master who  discussed with cardiology who feel patient is too high risk with history of esophageal varices for TEE to be done and recommended empiric treatment for endocarditis.  She will likely need 6-8 weeks of IV antibiotics or per ID recommendations.  Patient status post left ankle aspiration per interventional radiology with cultures positive for MRSA.  Patient status post irrigation and debridement per Dr. August Saucer 31-Mar-2017.  Patient with a worsening renal function and as such IV vancomycin has been discontinued.  Patient started on telfaro per ID.  Patient for core biopsy of questionable right upper lobe mass/abscess per IR today.  ID following and appreciate input and recommendations.   Probable acute metabolic encephalopathy from bacteremia/UTI/mild hepatic encephalopathy causing altered mental status -Patient responsive.  Drowsy.  Likely multifactorial secondary to acute illness/bacteremia, septic arthritis, pulmonary process and probable hepatic encephalopathy.  Patient with worsening bilirubin levels.  Patient also with some abdominal distention.  Continue lactulose 3 times daily and Xifaxan.  IV antibiotics, IV Lasix.  Follow.   MRSAUTI -Urine cultures positive for MRSA.  IV vancomycin was discontinued due to worsening renal function.  Creatinine climbing and as such will likely need to change IV antibiotics.  She was started on Tellfaro per ID 03/30/2017 will defer to ID.  Leukocytosis -Probably secondary to  MRSA bacteremia/UTI.  Leukocytosis trending back down but slowly.  Patient with right upper lobe mass with infiltrate into rib and muscle concerning for possible abscess which may be contributing to patient's worsening leukocytosis.  Currently afebrile.  2D  echo negative for vegetations.  TEE discussed with cardiology who feel patient is too high risk for TEE and recommending empiric treatment for endocarditis.  IV vancomycin has been held due to worsening renal function.  Will defer antibiotics to ID.   Decompensated liver Cirrhosiswith portal hypertension and mild hepatic encephalopathy/abdominal distention probable ascites -Patient now with distended abdomen and worsening renal function.  Patient also noted to have edema in thighs and lower abdomen.  Patient also jaundiced.  Bilirubin levels of increased and currently at 14.  Meld score of 33.  Patient still with confusion. -Increased lactulose to 3 times daily.  Continue Xifaxan.  Continue propranolol.. -CT abdomen showed only small ascites so doubt that patient has SBP.  -AST rising.  Bilirubin rising.  Placed on Lasix 40 mg IV every 12 hours.  Hold spironolactone due to hyperkalemia.  Due to worsening LFTs and elevated bilirubin level GI was reconsulted and have assessed the patient.  Patient noted to be with a poor prognosis.  Patient started on vitamin K times 3 days per gastroenterology.  -IR for ultrasound-guided paracentesis with labs.  We will give albumin post paracentesis.  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  No surgical intervention as per orthopedics. -fall precautions -PT evaluation -Per orthopedics.  Left ankle effusion/MRSA Septic arthritis of left ankle Concern for septic arthritis.  Patient seen by orthopedics.  Patient status post aspiration of left ankle effusion per interventional radiology 03/26/2017.  Cultures consistent with MRSA.  Patient is status post left ankle irrigation and debridement per Dr. August Saucer 03/23/2017 and cultures sent which are pending.  Discontinued IV  vancomycin due to worsening renal function.  Patient started on Teflaro per ID 03/30/2017.  ID and orthopedics following.   Hyponatremia -Probably from dehydration.  Improved.  Follow.  Thrombocytopenia -Likely secondary to cirrhosis.  Improved.   Diabetes mellitus Hemoglobin A1c 8.7.  CBGs have ranged from 203-239.  Continue current regimen of Levemir 15 units daily and sliding scale insulin.  Patient with poor oral intake and as such meal coverage insulin has been discontinued.    Hemoptysis Per RN patient noted to have some hemoptysis or questionable hematemesis the night of 03/26/2017.  Patient with no hemoptysis noted today.  No tongue lacerations or lip lacerations.  Patient with history of cirrhosis and esophageal varices and as such GI consultation was obtained.  Patient was seen in consultation by Dr. Russella Dar, gastroenterology who feels patient symptoms are more from hemoptysis as opposed to hematemesis.  GI recommended pulmonary evaluation.  Chest x-ray ordered, which showed a new right mid upper lung airspace opacity which may reflect pneumonia or hemorrhage.  Spoke with Dr. Vassie Loll of pulmonary who had recommended CT chest which was done which showed a right pectorial abscess and right upper lobe masslike opacity eroding into rib/muscle with concern for possible septic emboli.  Pulmonary recommended monitoring of hemoptysis and following H&H.  General surgery evaluated right upper lobe masslike lesion and recommended CVTS evaluation.    Right upper lobe masslike lesion--infiltrates into rib/muscle/right upper lobe infiltrate Noted on CT chest.  Patient  with persistent leukocytosis and clinical deterioration.  Patient seen in consultation by pulmonary as well as general surgery.  General surgery assessed patient evaluated CT scan and recommended evaluation by cardiothoracic surgery for further management.  Cardiothoracic surgery who feel abnormality noted on CT scan that this might be a  malignant process which may be superinfected.  Cardiothoracic surgery has asked IR to perform a core biopsy of the right chest wall mass initially and pending results will be determined whether surgical debridement is necessary.  Core biopsy of right chest wall mass pending for today 03/29/2017.  Appreciate cardiothoracic input and recommendations.    Acute renal failure/metabolic acidosis Patient now with worsening renal function with creatinine now up to 2.58.  Questionable etiology.  Differential includes hepatorenal syndrome versus volume overload versus drug-induced.  Patient noted to have some edema in thighs and lower abdominal region.  Abdomen is less tight today.  IV fluids have been saline locked.   IV vancomycin was discontinued as of 03/29/2017.  Repeat urinalysis pending.  Check a fractional excretion of sodium.  Random vancomycin level was 36.  Continue Lasix 40 mg IV every 12 hours.  Place on a bicarb drip.  Strict I's and O's.  Daily weights.  Consult with nephrology for further evaluation and manage.  Follow.   Acute urinary retention Placed Foley catheter however patient pulled it out 03/29/2017.  Flomax 0.4 mg daily.  Order placed for Foley catheter to be replaced back in.  Follow.    Hyperkalemia Likely secondary to acute renal failure.  Repeat potassium was 6.  Patient given Kayexalate.  Patient placed on IV diuretics as well as received D50 and insulin with no significant improvement with hyperkalemia.  Worsening renal function.  Check a EKG.  Repeat potassium at 6.3 this morning.  Kayexalate 45 g per rectal x1, 1 amp of D50, NovoLog 10 units x1.  Follow.  Prognosis Patient with poor prognosis.  Patient with history of hepatitis C with cirrhosis which is currently decompensated, patient in acute renal failure likely hepatorenal, patient with MRSA bacteremia and hepatic encephalopathy, MRSA septic left ankle joint.  Patient with ascites.  Patient minimally responsive at this time.  Per  ID 50% mortality of metastatic MRSA.  Patient with decompensated cirrhosis and acute renal failure on top of MRSA leading to poor prognosis.  Consult with palliative care for goals of care.      DVT prophylaxis: SCDs Code Status: Full Family Communication: Updated wife at bedside.  Disposition Plan: Remain in stepdown unit.     Consultants:   Infectious disease: Dr. Drue SecondSnider 03/22/2017  Orthopedics Dr. August Saucerean 03/23/2017  Psychiatry: Dr. Sharma CovertNorman 04/01/2017  Pulmonary : Dr. Vassie LollAlva 03/30/2017  Gastroenterology: Dr. Russella DarStark 03/27/2017  General surgery: Dr. Johna SheriffHoxworth 03/18/2017  Cardiothoracic surgery: Dr. Zenaida NieceVan trigt 03/29/2017  Nephrology pending  Procedures:   CT abdomen and pelvis 03/06/2017  Plain films of the ankle 03/24/2017, 03/23/2017  Chest x-ray 03/30/2017, 03/27/2017  MRI left ankle 03/24/2017  MRI right hip 03/22/2017  Ultrasound-guided aspiration left ankle joint effusion Per Dr. Lowella DandyHenn interventional radiology 03/26/2017  Plain films of bilateral hips with pelvis 03/30/2017  2D echo 03/22/2017   CT chest 03/27/2017  Irrigation and debridement of left ankle per Dr. August Saucerean 03/14/2017  Antimicrobials:   IV vancomycin 03/22/2017>>>>> 03/29/2017  IV Rocephin 03/12/2017>>>>> 03/24/2017  IV Teflaro 03/30/2017   Subjective: Patient minimally responsive.  Jaundiced.   Wife at bedside.   Objective: Vitals:   03/20/2017 0600 03/24/2017 0749 03/04/2017 0800 03/09/2017 1200  BP: 133/74  138/76   Pulse: 88  89   Resp: 12  13   Temp:  (!) 97 F (36.1 C) 97.6 F (36.4 C) (!) 97.2 F (36.2 C)  TempSrc:  Axillary Axillary Axillary  SpO2: 98%  97%   Weight:      Height:        Intake/Output Summary (Last 24 hours) at 03/29/2017 1227 Last data filed at 03/18/2017 0600 Gross per 24 hour  Intake 320 ml  Output 500 ml  Net -180 ml   Filed Weights   03/20/2017 0434 03/30/17 0543 03/30/17 1422  Weight: 105.8 kg (233 lb 4 oz) 111.1 kg (244 lb 14.9 oz) 108.8 kg (239 lb 13.8 oz)     Examination:  General exam: Minimally responsive. Jaundiced. Respiratory system: Some bibasilar crackles otherwise clear.  No wheezing.   Some crepitus right chest wall area on the lateral area, which is raised.  Respiratory effort normal. Cardiovascular system: Regular rate rhythm no murmurs rubs or gallops.  No JVD.  trace+ bilateral lower extremity edema.  2+ edema in upper thighs and hips as well as lower abdomen. Gastrointestinal system: Abdomen is less tight, mildly distended, positive bowel sounds.  Central nervous system: Minimally responsive.  Moving extremities spontaneously.   Extremities: Symmetric 5 x 5 power.  Left ankle wrapped. Skin: No rashes, lesions or ulcers Psychiatry: Unable to assess at this time.. Mood & affect appropriate.     Data Reviewed: I have personally reviewed following labs and imaging studies  CBC: Recent Labs  Lab 03/27/17 0631 03/27/17 1332 03/11/2017 0600 03/29/17 0632 03/30/17 0552 03/09/2017 0327  WBC 21.6* 22.5* 29.1* 31.2* 26.2* 26.4*  NEUTROABS 17.8*  --  25.9* 27.1* 21.7* 22.4*  HGB 12.5* 12.7* 12.5* 12.7* 12.4* 12.4*  HCT 36.7* 37.2* 37.2* 37.5* 36.5* 36.9*  MCV 89.5 90.1 90.7 90.8 91.5 92.3  PLT 159 170 160 217 182 159   Basic Metabolic Panel: Recent Labs  Lab 03/27/2017 0730 03/26/17 0617 03/27/17 0631 03/09/2017 0600 03/29/17 0632 03/30/17 0552 03/30/17 0924 03/30/17 1631 03/11/2017 0327  NA 133* 134* 134* 136 131* 137  --   --  136  K 4.5 4.3 4.8 5.0 5.2* 5.5* 6.0* 6.0* 6.3*  CL 108 107 109 108 107 113*  --   --  113*  CO2 21* 22 20* 21* 17* 17*  --   --  16*  GLUCOSE 276* 171* 174* 150* 160* 154*  --   --  233*  BUN 44* 42* 48* 52* 62* 74*  --   --  89*  CREATININE 1.32* 1.32* 1.30* 1.18 1.71* 2.20*  --   --  2.58*  CALCIUM 8.1* 8.1* 8.0* 8.0* 7.6* 7.8*  --   --  7.7*  MG 2.2 2.2  --   --   --   --   --   --  2.5*   GFR: Estimated Creatinine Clearance: 37 mL/min (A) (by C-G formula based on SCr of 2.58 mg/dL  (H)). Liver Function Tests: Recent Labs  Lab 03/26/17 0617 03/27/17 0631 03/20/2017 0600 03/30/17 0552 03/17/2017 0327  AST 189* 116* 123* 152* 263*  ALT 64* 54 55 60 88*  ALKPHOS 336* 320* 296* 252* 231*  BILITOT 5.1* 8.5* 9.2* 11.7* 14.0*  PROT 6.5 6.5 6.5 6.8 6.4*  ALBUMIN 1.7* 1.7* 1.5* 1.6* 1.4*   No results for input(s): LIPASE, AMYLASE in the last 168 hours. Recent Labs  Lab 03/30/17 1631  AMMONIA 82*   Coagulation Profile: Recent  Labs  Lab 03/30/17 0924 03/18/2017 0327  INR 1.71 1.92   Cardiac Enzymes: No results for input(s): CKTOTAL, CKMB, CKMBINDEX, TROPONINI in the last 168 hours. BNP (last 3 results) No results for input(s): PROBNP in the last 8760 hours. HbA1C: No results for input(s): HGBA1C in the last 72 hours. CBG: Recent Labs  Lab 03/30/17 1116 03/30/17 1608 03/30/17 2117 03/04/2017 0732 04/02/2017 1125  GLUCAP 191* 191* 237* 203* 239*   Lipid Profile: No results for input(s): CHOL, HDL, LDLCALC, TRIG, CHOLHDL, LDLDIRECT in the last 72 hours. Thyroid Function Tests: No results for input(s): TSH, T4TOTAL, FREET4, T3FREE, THYROIDAB in the last 72 hours. Anemia Panel: No results for input(s): VITAMINB12, FOLATE, FERRITIN, TIBC, IRON, RETICCTPCT in the last 72 hours. Sepsis Labs: No results for input(s): PROCALCITON, LATICACIDVEN in the last 168 hours.  Recent Results (from the past 240 hour(s))  Culture, blood (routine x 2)     Status: Abnormal   Collection Time: 2017-04-01  1:07 PM  Result Value Ref Range Status   Specimen Description BLOOD BLOOD LEFT FOREARM  Final   Special Requests   Final    BOTTLES DRAWN AEROBIC AND ANAEROBIC Blood Culture adequate volume   Culture  Setup Time   Final    GRAM POSITIVE COCCI IN BOTH AEROBIC AND ANAEROBIC BOTTLES CRITICAL RESULT CALLED TO, READ BACK BY AND VERIFIED WITH: Sophronia Simas PHARMD, AT 1308 03/22/17 BY D. VANHOOK    Culture (A)  Final    STAPHYLOCOCCUS AUREUS SUSCEPTIBILITIES PERFORMED ON PREVIOUS CULTURE  WITHIN THE LAST 5 DAYS. Performed at [facility], [address] [place], [place] [address]    Report Status 03/24/2017 FINAL  Final  Urine Culture     Status: Abnormal   Collection Time: 2017-04-01  3:36 PM  Result Value Ref Range Status   Specimen Description URINE, CLEAN CATCH  Final   Special Requests Normal  Final   Culture (A)  Final    >=100,000 COLONIES/mL METHICILLIN RESISTANT STAPHYLOCOCCUS AUREUS   Report Status 03/24/2017 FINAL  Final   Organism ID, Bacteria METHICILLIN RESISTANT STAPHYLOCOCCUS AUREUS (A)  Final      Susceptibility   Methicillin resistant staphylococcus aureus - MIC*    CIPROFLOXACIN <=0.5 SENSITIVE Sensitive     GENTAMICIN <=0.5 SENSITIVE Sensitive     NITROFURANTOIN <=16 SENSITIVE Sensitive     OXACILLIN >=4 RESISTANT Resistant     TETRACYCLINE <=1 SENSITIVE Sensitive     VANCOMYCIN <=0.5 SENSITIVE Sensitive     TRIMETH/SULFA <=10 SENSITIVE Sensitive     CLINDAMYCIN <=0.25 SENSITIVE Sensitive     RIFAMPIN <=0.5 SENSITIVE Sensitive     Inducible Clindamycin NEGATIVE Sensitive     * >=100,000 COLONIES/mL METHICILLIN RESISTANT STAPHYLOCOCCUS AUREUS  Culture, blood (routine x 2)     Status: Abnormal   Collection Time: 03/23/17  5:50 AM  Result Value Ref Range Status   Specimen Description BLOOD LEFT HAND  Final   Special Requests IN PEDIATRIC BOTTLE Blood Culture adequate volume  Final   Culture  Setup Time   Final    GRAM POSITIVE COCCI IN PEDIATRIC BOTTLE CRITICAL RESULT CALLED TO, READ BACK BY AND VERIFIED WITH: NICK GLOGOVAC AT 0743 ON [id number] BY SJW    Culture (A)  Final    STAPHYLOCOCCUS AUREUS SUSCEPTIBILITIES PERFORMED ON PREVIOUS CULTURE WITHIN THE LAST 5 DAYS. Performed at [facility], [address] [place], [place] [address]    Report Status 03/26/2017 FINAL  Final  Culture, blood (routine x  2)     Status: Abnormal   Collection Time: 03/23/17  5:50 AM  Result Value Ref Range Status   Specimen Description BLOOD LEFT  ANTECUBITAL  Final   Special Requests IN PEDIATRIC BOTTLE Blood Culture adequate volume  Final   Culture  Setup Time   Final    GRAM POSITIVE COCCI IN PEDIATRIC BOTTLE CRITICAL RESULT CALLED TO, READ BACK BY AND VERIFIED WITH: NICK GLOGOVAC PHARMD AT 0741 ON [id number] BY SJW    Culture (A)  Final    STAPHYLOCOCCUS AUREUS SUSCEPTIBILITIES PERFORMED ON PREVIOUS CULTURE WITHIN THE LAST 5 DAYS. Performed at [facility], [address], [place], [place] [address]    Report Status 03/26/2017 FINAL  Final  MRSA PCR Screening     Status: Abnormal   Collection Time: 03/05/2017  8:10 AM  Result Value Ref Range Status   MRSA by PCR POSITIVE (A) NEGATIVE Final    Comment:        The GeneXpert MRSA Assay (FDA approved for NASAL specimens only), is one component of a comprehensive MRSA colonization surveillance program. It is not intended to diagnose MRSA infection nor to guide or monitor treatment for MRSA infections. RESULT CALLED TO, READ BACK BY AND VERIFIED WITH: B.KAUR RN 1042 [id number] [email or web address]   Culture, blood (Routine X 2) w Reflex to ID Panel     Status: None (Preliminary result)   Collection Time: 03/26/17 10:14 AM  Result Value Ref Range Status   Specimen Description BLOOD RIGHT ANTECUBITAL  Final   Special Requests   Final    BOTTLES DRAWN AEROBIC AND ANAEROBIC Blood Culture adequate volume   Culture   Final    NO GROWTH 4 DAYS Performed at [facility], [address], [place], [place] [address]    Report Status PENDING  Incomplete  Culture, blood (Routine X 2) w Reflex to ID Panel     Status: None (Preliminary result)   Collection Time: 03/26/17 10:14 AM  Result Value Ref Range Status   Specimen Description BLOOD LEFT ANTECUBITAL  Final   Special Requests   Final    BOTTLES DRAWN AEROBIC AND ANAEROBIC Blood Culture adequate volume   Culture   Final    NO GROWTH 4 DAYS Performed at [facility], [address], [place], [place] [address]    Report Status  PENDING  Incomplete  Body fluid culture     Status: None   Collection Time: 03/26/17  1:04 PM  Result Value Ref Range Status   Specimen Description ANKLE SYNOVIAL  Final   Special Requests NONE  Final   Gram Stain   Final    NO ORGANISMS SEEN WBC PRESENT, PREDOMINANTLY PMN Gram Stain Report Called to,Read Back By and Verified With: D.BLOCK RN AT 1412 ON 03/26/16 BY S.VANHOORNE    Culture   Final    RARE METHICILLIN RESISTANT STAPHYLOCOCCUS AUREUS RESULT CALLED TO, READ BACK BY AND VERIFIED WITH: D BLOCK RN AT 1335 ON [id number] BY SJW Performed at [facility], [address], [place], [place] [address]    Report Status 03/29/2017 FINAL  Final   Organism ID, Bacteria METHICILLIN RESISTANT STAPHYLOCOCCUS AUREUS  Final      Susceptibility   Methicillin resistant staphylococcus aureus - MIC*    CIPROFLOXACIN <=0.5 SENSITIVE Sensitive     ERYTHROMYCIN >=8 RESISTANT Resistant     GENTAMICIN <=0.5 SENSITIVE Sensitive     OXACILLIN >=4 RESISTANT Resistant     TETRACYCLINE <=  1 SENSITIVE Sensitive     VANCOMYCIN 1 SENSITIVE Sensitive     TRIMETH/SULFA <=10 SENSITIVE Sensitive     CLINDAMYCIN <=0.25 SENSITIVE Sensitive     RIFAMPIN <=0.5 SENSITIVE Sensitive     Inducible Clindamycin NEGATIVE Sensitive     * RARE METHICILLIN RESISTANT STAPHYLOCOCCUS AUREUS  Anaerobic culture     Status: None   Collection Time: 03/26/17  1:04 PM  Result Value Ref Range Status   Specimen Description ANKLE SYNOVIAL  Final   Special Requests NONE  Final   Culture   Final    NO ANAEROBES ISOLATED Performed at [facility], [address], [place], [place] [address]    Report Status 03/16/2017 FINAL  Final  Anaerobic culture     Status: None (Preliminary result)   Collection Time: 03/29/2017  3:53 PM  Result Value Ref Range Status   Specimen Description WOUND LEFT ANKLE  Final   Special Requests NONE  Final   Culture   Final    NO ANAEROBES ISOLATED; CULTURE IN PROGRESS FOR 5 DAYS   Report Status  PENDING  Incomplete  Aerobic Culture (superficial specimen)     Status: None   Collection Time: 03/14/2017  3:53 PM  Result Value Ref Range Status   Specimen Description WOUND LEFT ANKLE  Final   Special Requests NONE  Final   Gram Stain   Final    RARE WBC PRESENT, PREDOMINANTLY PMN NO ORGANISMS SEEN Performed at [facility], [address] [place], [place] [address]    Culture RARE METHICILLIN RESISTANT STAPHYLOCOCCUS AUREUS  Final   Report Status 03/18/2017 FINAL  Final   Organism ID, Bacteria METHICILLIN RESISTANT STAPHYLOCOCCUS AUREUS  Final      Susceptibility   Methicillin resistant staphylococcus aureus - MIC*    CIPROFLOXACIN <=0.5 SENSITIVE Sensitive     ERYTHROMYCIN >=8 RESISTANT Resistant     GENTAMICIN <=0.5 SENSITIVE Sensitive     OXACILLIN >=4 RESISTANT Resistant     TETRACYCLINE <=1 SENSITIVE Sensitive     VANCOMYCIN 1 SENSITIVE Sensitive     TRIMETH/SULFA <=10 SENSITIVE Sensitive     CLINDAMYCIN <=0.25 SENSITIVE Sensitive     RIFAMPIN <=0.5 SENSITIVE Sensitive     Inducible Clindamycin NEGATIVE Sensitive     * RARE METHICILLIN RESISTANT STAPHYLOCOCCUS AUREUS         Radiology Studies: US Renal  Result Date: 03/30/2017 CLINICAL DATA:  Acute renal failure EXAM: RENAL / URINARY TRACT ULTRASOUND COMPLETE COMPARISON:  03/16/2017. FINDINGS: Right Kidney: Length: 11.3 cm. Scattered nonobstructing renal calculi are noted similar to that seen on prior CT examination. Left Kidney: Length: 11.3 cm. Scattered nonobstructing renal stones are noted. Known cystic lesion seen on recent CT is not as well appreciated on today's exam. Bladder: Not well distended. IMPRESSION: Bilateral nonobstructing renal stones similar to that seen on prior CT. Previously noted left renal cyst is not well appreciated on this exam. Electronically Signed   By: Alcide Clever M.D.   On: 03/30/2017 10:30        Scheduled Meds: . aspirin EC  81 mg Oral Daily  . furosemide  40 mg  Intravenous Q12H  . gabapentin  600 mg Oral BID  . insulin aspart  0-15 Units Subcutaneous TID WC  . insulin aspart  0-5 Units Subcutaneous QHS  . insulin detemir  15 Units Subcutaneous QHS  . lactulose  30 g Oral TID  . multivitamin with minerals  1 tablet Oral Daily  . pantoprazole (  PROTONIX) IV  40 mg Intravenous Q12H  . phytonadione  10 mg Subcutaneous Daily  . propranolol  20 mg Oral BID  . rifaximin  550 mg Oral BID  . sodium bicarbonate  25 mEq Intravenous Once  . sodium chloride flush  3 mL Intravenous Q12H  . sodium polystyrene  45 g Rectal Once  . tamsulosin  0.4 mg Oral Daily  . venlafaxine  150 mg Oral Q breakfast  . venlafaxine  75 mg Oral QHS   Continuous Infusions: . sodium chloride 250 mL (03/30/17 2300)  . albumin human    . ceFTAROline (TEFLARO) IV Stopped (03/30/17 2301)  .  sodium bicarbonate (isotonic) infusion in sterile water 75 mL/hr at 03/19/2017 0921     LOS: 10 days    Time spent: 35 mins    Ramiro Harvest, MD [facility] Pager [phone]  If 7PM-7AM, please contact [email or web address] [email or web address] Password [facility] 03/08/2017, 12:27 PM

## 2017-04-01 DIAGNOSIS — R400 Somnolence: Secondary | ICD-10-CM

## 2017-04-01 DIAGNOSIS — K659 Peritonitis, unspecified: Secondary | ICD-10-CM

## 2017-04-01 LAB — URINE CULTURE: CULTURE: NO GROWTH

## 2017-04-01 LAB — GLUCOSE, CAPILLARY
GLUCOSE-CAPILLARY: 171 mg/dL — AB (ref 65–99)
Glucose-Capillary: 124 mg/dL — ABNORMAL HIGH (ref 65–99)
Glucose-Capillary: 121 mg/dL — ABNORMAL HIGH (ref 65–99)
Glucose-Capillary: 127 mg/dL — ABNORMAL HIGH (ref 65–99)

## 2017-04-01 LAB — COMPREHENSIVE METABOLIC PANEL
ALT: 88 U/L — ABNORMAL HIGH (ref 17–63)
ANION GAP: 9 (ref 5–15)
AST: 200 U/L — AB (ref 15–41)
Albumin: 1.3 g/dL — ABNORMAL LOW (ref 3.5–5.0)
Alkaline Phosphatase: 209 U/L — ABNORMAL HIGH (ref 38–126)
BUN: 94 mg/dL — ABNORMAL HIGH (ref 6–20)
CHLORIDE: 111 mmol/L (ref 101–111)
CO2: 19 mmol/L — ABNORMAL LOW (ref 22–32)
Calcium: 7.6 mg/dL — ABNORMAL LOW (ref 8.9–10.3)
Creatinine, Ser: 3.12 mg/dL — ABNORMAL HIGH (ref 0.61–1.24)
GFR, EST AFRICAN AMERICAN: 22 mL/min — AB (ref >60)
GFR, EST NON AFRICAN AMERICAN: 19 mL/min — AB (ref >60)
Glucose, Bld: 182 mg/dL — ABNORMAL HIGH (ref 65–99)
POTASSIUM: 4.5 mmol/L (ref 3.5–5.1)
Sodium: 139 mmol/L (ref 135–145)
TOTAL PROTEIN: 6.6 g/dL (ref 6.5–8.1)
Total Bilirubin: 13.4 mg/dL — ABNORMAL HIGH (ref 0.3–1.2)

## 2017-04-01 LAB — CBC WITH DIFFERENTIAL/PLATELET
BASOS ABS: 0 10*3/uL (ref 0.0–0.1)
BASOS PCT: 0 %
EOS ABS: 0.2 10*3/uL (ref 0.0–0.7)
Eosinophils Relative: 1 %
HEMATOCRIT: 36.8 % — AB (ref 39.0–52.0)
HEMOGLOBIN: 12.6 g/dL — AB (ref 13.0–17.0)
Lymphocytes Relative: 6 %
Lymphs Abs: 1.3 10*3/uL (ref 0.7–4.0)
MCH: 31.1 pg (ref 26.0–34.0)
MCHC: 34.2 g/dL (ref 30.0–36.0)
MCV: 90.9 fL (ref 78.0–100.0)
Monocytes Absolute: 0.9 10*3/uL (ref 0.1–1.0)
Monocytes Relative: 4 %
NEUTROS PCT: 89 %
Neutro Abs: 18.7 10*3/uL — ABNORMAL HIGH (ref 1.7–7.7)
Platelets: 122 10*3/uL — ABNORMAL LOW (ref 150–400)
RBC: 4.05 MIL/uL — ABNORMAL LOW (ref 4.22–5.81)
RDW: 18.5 % — ABNORMAL HIGH (ref 11.5–15.5)
WBC: 21.2 10*3/uL — AB (ref 4.0–10.5)

## 2017-04-01 LAB — PROTIME-INR
INR: 1.92
PROTHROMBIN TIME: 21.8 s — AB (ref 11.4–15.2)

## 2017-04-01 LAB — GRAM STAIN

## 2017-04-01 MED ORDER — LACTULOSE ENEMA
300.0000 mL | Freq: Every day | ORAL | Status: DC
  Administered 2017-04-01: 300 mL via RECTAL
  Filled 2017-04-01 (×4): qty 300

## 2017-04-01 NOTE — Progress Notes (Signed)
PROGRESS NOTE    Kurt Reid  ZOX:[id number] DOB: 1950/04/09 DOA: 03/06/2017 PCP: [name], MD    Brief Narrative:  67 year old male with history of liver cirrhosis secondary to hepatitis C, diabetes, bipolar disorder sent by PCP for evaluation of abdominal pain and confusion.  Patient was admitted with leukocytosis probably from UTI and started on antibiotics.  He was found to have MRSA bacteremia.  ID was consulted.  Patient also noted to have a septic arthritis status post irrigation and debridement per orthopedics.  Cultures growing MRSA.  Patient also noted to have a pulmonary process invading through the rib concerning for malignant process versus abscess formation.  Cardiothoracic surgery consulted.     Assessment & Plan:   Principal Problem:   Sepsis due to methicillin resistant Staphylococcus aureus (HCC) Active Problems:   BIPOLAR AFFECTIVE DISORDER   Chronic pain syndrome   Esophageal varices (HCC)   Goals of care, counseling/discussion   Hepatic cirrhosis due to chronic hepatitis C infection (HCC)   Hemoptysis   DM type 2 without retinopathy (HCC)   Chronic hepatitis C (HCC)   Leukocytosis   Sepsis (HCC)   Altered mental status   Lower urinary tract infectious disease   Delirium due to another medical condition   Ankle pain, left   Left ankle effusion   Hematemesis   Acute urinary retention   Staphylococcal arthritis of left ankle (HCC)   Abscess of upper lobe of right lung with pneumonia (HCC)   Hyperkalemia   Hyperbilirubinemia   ARF (acute renal failure) (HCC)   Ascites   Mass of chest wall, right   Subcutaneous emphysema (HCC)   Acute liver failure with hepatic coma (HCC)   Palliative care encounter  Sepsis secondary to MRSA bacteremia -Questionable cause. Likely seeded from ankle. Repeat blood cultures from 03/23/2017 positive.  Blood cultures from 03/26/2017 still negative. ID following.  2D echo negative for vegetation.  Patient was to have a  TEE on 03/22/2017 however patient refused.  Patient has been seen by psychiatry and lacks capacity at this time.  RN spoke with family who will be fine signing consent for TEE.  Cardiology was consulted to see if TEE could be rearranged.  Spoke with the card master who  discussed with cardiology who feel patient is too high risk with history of esophageal varices for TEE to be done and recommended empiric treatment for endocarditis.  She will likely need 6-8 weeks of IV antibiotics or per ID recommendations.  Patient status post left ankle aspiration per interventional radiology with cultures positive for MRSA.  Patient status post irrigation and debridement per Dr. August Saucer 03/28/2017.  Patient with a worsening renal function and as such IV vancomycin has been discontinued.  Patient started on telfaro per ID.  Patient for core biopsy of questionable right upper lobe mass/abscess per IR 04/02/2017 which was concerning for hematoma.  ID following and appreciate input and recommendations. ?? PICC line placement, will defer to ID.  Probable acute metabolic encephalopathy from bacteremia/UTI/mild hepatic encephalopathy causing altered mental status -Patient responsive.  Drowsy.  Likely multifactorial secondary to acute illness/bacteremia, septic arthritis, pulmonary process and probable hepatic encephalopathy.  Patient with worsening bilirubin levels.  Patient also with some abdominal distention.  Continue lactulose 3 times daily and Xifaxan.  IV antibiotics, IV Lasix.  Follow.   MRSAUTI -Urine cultures positive for MRSA.  IV vancomycin was discontinued due to worsening renal function.  Creatinine climbing and as such will likely need to change  IV antibiotics. Patient was started on Tellfaro per ID 03/30/2017 will defer to ID.    Leukocytosis -Probably secondary to  MRSA bacteremia/UTI.  Leukocytosis trending back down but slowly.  Patient with right upper lobe mass with infiltrate into rib and muscle concerning  for possible abscess which may be contributing to patient's worsening leukocytosis.  Currently afebrile.  2D echo negative for vegetations.  TEE discussed with cardiology who feel patient is too high risk for TEE and recommending empiric treatment for endocarditis.  IV vancomycin has been held due to worsening renal function.  Will defer antibiotics to ID.   Decompensated liver Cirrhosiswith portal hypertension and mild hepatic encephalopathy/abdominal distention probable ascites -Patient now with distended abdomen and worsening renal function.  Patient also noted to have edema in thighs and lower abdomen.  Patient also jaundiced.  Bilirubin levels of increased and currently at 13.4 from14.  Meld score of 33.  Patient still with confusion. -Increased lactulose to 3 times daily.  Continue Xifaxan.  Continue propranolol.. -CT abdomen showed only small ascites so doubt that patient has SBP.  -AST rising.  Bilirubin rising.  Placed on Lasix 40 mg IV every 12 hours.  Hold spironolactone due to hyperkalemia.  Due to worsening LFTs and elevated bilirubin level GI was reconsulted and have assessed the patient.  Patient noted to be with a poor prognosis.  Patient started on vitamin K times 3 days per gastroenterology.  -IR for ultrasound-guided paracentesis with 2 L removed.Patient given albumin post paracentesis.  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  No surgical intervention as per orthopedics. -fall precautions -PT evaluation -Per orthopedics.  Left ankle effusion/MRSA Septic arthritis of left ankle Concern for septic arthritis.  Patient seen by orthopedics.  Patient status post aspiration of left ankle effusion per interventional radiology 03/26/2017.  Cultures consistent with MRSA.  Patient is status post  left ankle irrigation and debridement per Dr. August Saucer 03/22/2017 and cultures sent which are pending.  Discontinued IV vancomycin due to worsening renal function.  Patient started on Teflaro per ID 03/30/2017.  ID and orthopedics following.   Hyponatremia -Probably from dehydration.  Improved.  Follow.  Thrombocytopenia -Likely secondary to cirrhosis.  Improved.   Diabetes mellitus Hemoglobin A1c 8.7.  CBGs have ranged from 124-171.  Continue current regimen of Levemir 15 units daily and sliding scale insulin.  Patient with poor oral intake and as such meal coverage insulin has been discontinued.    Hemoptysis Per RN patient noted to have some hemoptysis or questionable hematemesis the night of 03/26/2017.  Patient with no hemoptysis noted today.  No tongue lacerations or lip lacerations.  Patient with history of cirrhosis and esophageal varices and as such GI consultation was obtained.  Patient was seen in consultation by Dr. Russella Dar, gastroenterology who feels patient symptoms are more from hemoptysis as opposed to hematemesis.  GI recommended pulmonary evaluation.  Chest x-ray ordered, which showed a new right mid upper lung airspace opacity which may reflect pneumonia or hemorrhage.  Spoke with Dr. Vassie Loll of pulmonary who had recommended CT chest which was done which showed a right pectorial abscess and right upper lobe masslike opacity eroding into rib/muscle with concern for possible septic emboli.  Pulmonary recommended monitoring of hemoptysis and following H&H.  General surgery evaluated right upper lobe masslike lesion and recommended CVTS evaluation.  Right upper lobe masslike lesion--infiltrates into rib/muscle/right upper lobe infiltrate Noted on CT chest.  Patient with persistent leukocytosis and clinical deterioration.  Patient seen in consultation by pulmonary as well as general surgery.  General surgery assessed patient evaluated CT scan and recommended evaluation by cardiothoracic surgery  for further management.  Cardiothoracic surgery who feel abnormality noted on CT scan that this might be a malignant process which may be superinfected.  Cardiothoracic surgery has asked IR to perform a core biopsy of the right chest wall mass initially and pending results will be determined whether surgical debridement is necessary.  Core biopsy of right chest wall mass attempted 03/20/2017 with concern that may be hematoma.  Appreciate cardiothoracic input and recommendations.    Acute renal failure/metabolic acidosis likely secondary to hepatorenal versus drug-induced Patient now with worsening renal function with creatinine now up to 3.12.   Questionable etiology.  Differential includes hepatorenal syndrome versus drug-induced.  Patient noted to be volume overloaded.  Patient on IV diuretics with a urine output of 1.730 L over the past 24 hours.  Patient status post ultrasound-guided paracentesis with 2 L of ascitic fluid removed.   IV fluids have been saline locked.   IV vancomycin was discontinued as of 03/29/2017.  Random vancomycin level was 36.  Continue Lasix 40 mg IV every 12 hours, continue bicarb drip.  Strict I's and O's.  Daily weights.  She has been seen in consultation by nephrology who has spoken with patient's family members and patient noted to have a high mortality rate now with renal failure and liver failure on top of his MRSA bacteremia/septic arthritis.  CCRT is being considered per nephrology to help remove Vanco, correct metabolic abnormalities until better handle on survival chances noted.  Family to decide.  Neurontin discontinued per nephrology.  Nephrology following and appreciate their input and recommendations.    Acute urinary retention Placed Foley catheter however patient pulled it out 03/29/2017.  Flomax 0.4 mg daily.  Continue Foley catheter.      Hyperkalemia Likely secondary to acute renal failure.  Potassium now at 4.5.  Patient status post Kayexalate enemas, on IV  diuretics, also received D50 and NovoLog on 03/24/2017.  Nephrology following.   Prognosis Patient with poor prognosis.  Patient with history of hepatitis C with cirrhosis which is currently decompensated, patient in acute renal failure likely hepatorenal, patient with MRSA bacteremia and hepatic encephalopathy, MRSA septic left ankle joint.  Patient with ascites.  Patient minimally responsive at this time.  Per ID 50% mortality of metastatic MRSA.  Patient with decompensated cirrhosis and acute renal failure on top of MRSA leading to poor prognosis.  Concern for hepatorenal syndrome.  Patient has been seen by palliative care for goals of care.  Please notes.        DVT prophylaxis: SCDs Code Status: Full Family Communication: Wife asleep at bedside.  We will update later on today.  Disposition Plan: Remain in stepdown unit.     Consultants:   Infectious disease: Dr. Drue Second 03/22/2017  Orthopedics Dr. August Saucer 03/23/2017  Psychiatry: Dr. Sharma Covert 03/09/2017  Pulmonary : Dr. Vassie Loll 03/30/2017  Gastroenterology: Dr. Russella Dar 03/27/2017  General surgery: Dr. Johna Sheriff 03/09/2017  Cardiothoracic surgery: Dr. Zenaida Niece trigt 03/29/2017  Nephrology Dr. Eliott Nine 03/10/2017  Palliative care: Yong Channel, NP 04/02/2017  Procedures:   CT abdomen and pelvis 03/20/2017  Plain films of the ankle 03/09/2017, 03/23/2017  Chest x-ray 03/31/2017, 03/27/2017  MRI left ankle 03/24/2017  MRI right hip 03/22/2017  Ultrasound-guided aspiration  left ankle joint effusion Per Dr. Lowella Dandy interventional radiology 03/26/2017  Plain films of bilateral hips with pelvis 03/17/2017  2D echo 03/22/2017   CT chest 03/27/2017  Irrigation and debridement of left ankle per Dr. August Saucer 03/19/2017  CT-guided right chest wall aspiration and biopsy attempt per Dr. Denny Levy 03/27/2017  Ultrasound-guided paracentesis 03/08/2017--2 L ascitic fluid removed per Dr. Denny Levy    Antimicrobials:   IV vancomycin 03/22/2017>>>>> 03/29/2017  IV  Rocephin 03/17/2017>>>>> 03/24/2017  IV Teflaro 03/30/2017   Subjective: Patient received some morphine this morning and sleeping restfully.  Jaundiced.  Wife asleep at bedside.    Objective: Vitals:   04/01/17 0400 04/01/17 0500 04/01/17 0800 04/01/17 0834  BP: 113/68 (!) 108/59 131/72   Pulse: 94 93 88   Resp: 14 12 14    Temp:   97.7 F (36.5 C)   TempSrc:   Oral   SpO2: 95% 96% 95%   Weight:    105.4 kg (232 lb 5.8 oz)  Height:        Intake/Output Summary (Last 24 hours) at 04/01/2017 0923 Last data filed at 04/01/2017 0800 Gross per 24 hour  Intake 2373.75 ml  Output 3730 ml  Net -1356.25 ml   Filed Weights   03/30/17 0543 03/30/17 1422 04/01/17 0834  Weight: 111.1 kg (244 lb 14.9 oz) 108.8 kg (239 lb 13.8 oz) 105.4 kg (232 lb 5.8 oz)    Examination:  General exam: Sleeping restfully.  Jaundiced. Respiratory system: Clear to auscultation anterior lung fields.  No wheezing noted.  Right chest wall area with bandage and raised.  Some bibasilar crackles otherwise.  Respiratory effort normal. Cardiovascular system: Regular rate rhythm no murmurs rubs or gallops.  No JVD.  2+ bilateral lower extremity edema in upper thighs and hips as well as lower abdomen. Gastrointestinal system: Abdomen is less tight, mildly distended, positive bowel sounds.  Central nervous system: Sleeping. Moving extremities spontaneously.   Extremities: Symmetric 5 x 5 power.  Left ankle wrapped. Skin: No rashes, lesions or ulcers Psychiatry: Unable to assess at this time.. Mood & affect appropriate.     Data Reviewed: I have personally reviewed following labs and imaging studies  CBC: Recent Labs  Lab 03/20/2017 0600 03/29/17 0632 03/30/17 0552 03/15/2017 0327 04/01/17 0338  WBC 29.1* 31.2* 26.2* 26.4* 21.2*  NEUTROABS 25.9* 27.1* 21.7* 22.4* 18.7*  HGB 12.5* 12.7* 12.4* 12.4* 12.6*  HCT 37.2* 37.5* 36.5* 36.9* 36.8*  MCV 90.7 90.8 91.5 92.3 90.9  PLT 160 217 182 159 122*   Basic  Metabolic Panel: Recent Labs  Lab 03/26/17 0617  03/29/17 6045 03/30/17 0552 03/30/17 0924 03/30/17 1631 03/28/2017 0327 03/22/2017 1725 04/01/17 0338  NA 134*   < > 131* 137  --   --  136 138 139  K 4.3   < > 5.2* 5.5* 6.0* 6.0* 6.3* 5.3* 4.5  CL 107   < > 107 113*  --   --  113* 112* 111  CO2 22   < > 17* 17*  --   --  16* 18* 19*  GLUCOSE 171*   < > 160* 154*  --   --  233* 213* 182*  BUN 42*   < > 62* 74*  --   --  89* 90* 94*  CREATININE 1.32*   < > 1.71* 2.20*  --   --  2.58* 2.96* 3.12*  CALCIUM 8.1*   < > 7.6* 7.8*  --   --  7.7* 7.7* 7.6*  MG 2.2  --   --   --   --   --  2.5*  --   --   PHOS  --   --   --   --   --   --   --  5.5*  --    < > = values in this interval not displayed.   GFR: Estimated Creatinine Clearance: 30.2 mL/min (A) (by C-G formula based on SCr of 3.12 mg/dL (H)). Liver Function Tests: Recent Labs  Lab 03/27/17 0631 03/07/2017 0600 03/30/17 0552 03/22/2017 0327 03/22/2017 1725 04/01/17 0338  AST 116* 123* 152* 263*  --  200*  ALT 54 55 60 88*  --  88*  ALKPHOS 320* 296* 252* 231*  --  209*  BILITOT 8.5* 9.2* 11.7* 14.0*  --  13.4*  PROT 6.5 6.5 6.8 6.4*  --  6.6  ALBUMIN 1.7* 1.5* 1.6* 1.4* 1.4* 1.3*   No results for input(s): LIPASE, AMYLASE in the last 168 hours. Recent Labs  Lab 03/30/17 1631  AMMONIA 82*   Coagulation Profile: Recent Labs  Lab 03/30/17 0924 03/11/2017 0327 04/01/17 0338  INR 1.71 1.92 1.92   Cardiac Enzymes: No results for input(s): CKTOTAL, CKMB, CKMBINDEX, TROPONINI in the last 168 hours. BNP (last 3 results) No results for input(s): PROBNP in the last 8760 hours. HbA1C: No results for input(s): HGBA1C in the last 72 hours. CBG: Recent Labs  Lab 03/17/2017 1125 03/10/2017 1432 03/08/2017 1704 03/26/2017 2122 04/01/17 0756  GLUCAP 239* 194* 198* 186* 171*   Lipid Profile: No results for input(s): CHOL, HDL, LDLCALC, TRIG, CHOLHDL, LDLDIRECT in the last 72 hours. Thyroid Function Tests: No results for input(s):  TSH, T4TOTAL, FREET4, T3FREE, THYROIDAB in the last 72 hours. Anemia Panel: No results for input(s): VITAMINB12, FOLATE, FERRITIN, TIBC, IRON, RETICCTPCT in the last 72 hours. Sepsis Labs: No results for input(s): PROCALCITON, LATICACIDVEN in the last 168 hours.  Recent Results (from the past 240 hour(s))  Culture, blood (routine x 2)     Status: Abnormal   Collection Time: 03/23/17  5:50 AM  Result Value Ref Range Status   Specimen Description BLOOD LEFT HAND  Final   Special Requests IN PEDIATRIC BOTTLE Blood Culture adequate volume  Final   Culture  Setup Time   Final    GRAM POSITIVE COCCI IN PEDIATRIC BOTTLE CRITICAL RESULT CALLED TO, READ BACK BY AND VERIFIED WITH: NICK GLOGOVAC AT 0743 ON [id number] BY SJW    Culture (A)  Final    STAPHYLOCOCCUS AUREUS SUSCEPTIBILITIES PERFORMED ON PREVIOUS CULTURE WITHIN THE LAST 5 DAYS. Performed at [facility], [address] [place], [place] [address]    Report Status 03/26/2017 FINAL  Final  Culture, blood (routine x 2)     Status: Abnormal   Collection Time: 03/23/17  5:50 AM  Result Value Ref Range Status   Specimen Description BLOOD LEFT ANTECUBITAL  Final   Special Requests IN PEDIATRIC BOTTLE Blood Culture adequate volume  Final   Culture  Setup Time   Final    GRAM POSITIVE COCCI IN PEDIATRIC BOTTLE CRITICAL RESULT CALLED TO, READ BACK BY AND VERIFIED WITH: NICK GLOGOVAC PHARMD AT 0741 ON [id number] BY SJW    Culture (A)  Final    STAPHYLOCOCCUS AUREUS SUSCEPTIBILITIES PERFORMED ON PREVIOUS CULTURE WITHIN THE LAST 5 DAYS. Performed at [facility], [address] [place], [place] [address]    Report Status 03/26/2017 FINAL  Final  MRSA PCR Screening     Status: Abnormal   Collection Time: 04/02/2017  8:10 AM  Result Value Ref  Range Status   MRSA by PCR POSITIVE (A) NEGATIVE Final    Comment:        The GeneXpert MRSA Assay (FDA approved for NASAL specimens only), is one component of a comprehensive MRSA  colonization surveillance program. It is not intended to diagnose MRSA infection nor to guide or monitor treatment for MRSA infections. RESULT CALLED TO, READ BACK BY AND VERIFIED WITH: B.KAUR RN 1042 [id number] A.QUIZON   Culture, blood (Routine X 2) w Reflex to ID Panel     Status: None   Collection Time: 03/26/17 10:14 AM  Result Value Ref Range Status   Specimen Description BLOOD RIGHT ANTECUBITAL  Final   Special Requests   Final    BOTTLES DRAWN AEROBIC AND ANAEROBIC Blood Culture adequate volume   Culture   Final    NO GROWTH 5 DAYS Performed at [facility], [address] [place], [place] [address]    Report Status 2017/04/11 FINAL  Final  Culture, blood (Routine X 2) w Reflex to ID Panel     Status: None   Collection Time: 03/26/17 10:14 AM  Result Value Ref Range Status   Specimen Description BLOOD LEFT ANTECUBITAL  Final   Special Requests   Final    BOTTLES DRAWN AEROBIC AND ANAEROBIC Blood Culture adequate volume   Culture   Final    NO GROWTH 5 DAYS Performed at [facility], [address] [place], [place] [address]    Report Status 04-11-17 FINAL  Final  Body fluid culture     Status: None   Collection Time: 03/26/17  1:04 PM  Result Value Ref Range Status   Specimen Description ANKLE SYNOVIAL  Final   Special Requests NONE  Final   Gram Stain   Final    NO ORGANISMS SEEN WBC PRESENT, PREDOMINANTLY PMN Gram Stain Report Called to,Read Back By and Verified With: D.BLOCK RN AT 1412 ON 03/26/16 BY S.VANHOORNE    Culture   Final    RARE METHICILLIN RESISTANT STAPHYLOCOCCUS AUREUS RESULT CALLED TO, READ BACK BY AND VERIFIED WITH: D BLOCK RN AT 1335 ON [id number] BY SJW Performed at [facility], [address], [place], [place] [address]    Report Status 03/29/2017 FINAL  Final   Organism ID, Bacteria METHICILLIN RESISTANT STAPHYLOCOCCUS AUREUS  Final      Susceptibility   Methicillin resistant staphylococcus aureus - MIC*    CIPROFLOXACIN  <=0.5 SENSITIVE Sensitive     ERYTHROMYCIN >=8 RESISTANT Resistant     GENTAMICIN <=0.5 SENSITIVE Sensitive     OXACILLIN >=4 RESISTANT Resistant     TETRACYCLINE <=1 SENSITIVE Sensitive     VANCOMYCIN 1 SENSITIVE Sensitive     TRIMETH/SULFA <=10 SENSITIVE Sensitive     CLINDAMYCIN <=0.25 SENSITIVE Sensitive     RIFAMPIN <=0.5 SENSITIVE Sensitive     Inducible Clindamycin NEGATIVE Sensitive     * RARE METHICILLIN RESISTANT STAPHYLOCOCCUS AUREUS  Anaerobic culture     Status: None   Collection Time: 03/26/17  1:04 PM  Result Value Ref Range Status   Specimen Description ANKLE SYNOVIAL  Final   Special Requests NONE  Final   Culture   Final    NO ANAEROBES ISOLATED Performed at [facility], [address] [place], [place] [address]    Report Status Apr 11, 2017 FINAL  Final  Anaerobic culture     Status: None (Preliminary result)   Collection Time: 03/18/2017  3:53 PM  Result Value Ref  Range Status   Specimen Description WOUND LEFT ANKLE  Final   Special Requests NONE  Final   Culture   Final    NO ANAEROBES ISOLATED; CULTURE IN PROGRESS FOR 5 DAYS   Report Status PENDING  Incomplete  Aerobic Culture (superficial specimen)     Status: None   Collection Time: 03/18/2017  3:53 PM  Result Value Ref Range Status   Specimen Description WOUND LEFT ANKLE  Final   Special Requests NONE  Final   Gram Stain   Final    RARE WBC PRESENT, PREDOMINANTLY PMN NO ORGANISMS SEEN Performed at [facility], [address] [place], [place] [address]    Culture RARE METHICILLIN RESISTANT STAPHYLOCOCCUS AUREUS  Final   Report Status 03/27/2017 FINAL  Final   Organism ID, Bacteria METHICILLIN RESISTANT STAPHYLOCOCCUS AUREUS  Final      Susceptibility   Methicillin resistant staphylococcus aureus - MIC*    CIPROFLOXACIN <=0.5 SENSITIVE Sensitive     ERYTHROMYCIN >=8 RESISTANT Resistant     GENTAMICIN <=0.5 SENSITIVE Sensitive     OXACILLIN >=4 RESISTANT Resistant     TETRACYCLINE <=1  SENSITIVE Sensitive     VANCOMYCIN 1 SENSITIVE Sensitive     TRIMETH/SULFA <=10 SENSITIVE Sensitive     CLINDAMYCIN <=0.25 SENSITIVE Sensitive     RIFAMPIN <=0.5 SENSITIVE Sensitive     Inducible Clindamycin NEGATIVE Sensitive     * RARE METHICILLIN RESISTANT STAPHYLOCOCCUS AUREUS  Culture, Urine     Status: None   Collection Time: 04/02/2017  9:40 AM  Result Value Ref Range Status   Specimen Description URINE, CLEAN CATCH  Final   Special Requests NONE  Final   Culture   Final    NO GROWTH Performed at [facility], [address], [place], [place] [address]    Report Status 04/01/2017 FINAL  Final  Aerobic/Anaerobic Culture (surgical/deep wound)     Status: None (Preliminary result)   Collection Time: 03/04/2017  4:06 PM  Result Value Ref Range Status   Specimen Description CHEST RIGHT  Final   Special Requests Normal  Final   Gram Stain   Final    MODERATE WBC PRESENT, PREDOMINANTLY PMN NO ORGANISMS SEEN Performed at [facility], [address] [place], [place] [address]    Culture PENDING  Incomplete   Report Status PENDING  Incomplete  Gram stain     Status: None   Collection Time: 03/04/2017  4:57 PM  Result Value Ref Range Status   Specimen Description FLUID PERITONEAL  Final   Special Requests NONE  Final   Gram Stain   Final    RARE WBC PRESENT, PREDOMINANTLY PMN NO ORGANISMS SEEN Performed at [facility], [address] [place], [place] [address]    Report Status 04/01/2017 FINAL  Final         Radiology Studies: US Renal  Result Date: 03/30/2017 CLINICAL DATA:  Acute renal failure EXAM: RENAL / URINARY TRACT ULTRASOUND COMPLETE COMPARISON:  03/08/2017. FINDINGS: Right Kidney: Length: 11.3 cm. Scattered nonobstructing renal calculi are noted similar to that seen on prior CT examination. Left Kidney: Length: 11.3 cm. Scattered nonobstructing renal stones are noted. Known cystic lesion seen on recent CT is not as well appreciated on today's  exam. Bladder: Not well distended. IMPRESSION: Bilateral nonobstructing renal stones similar to that seen on prior CT. Previously noted left renal cyst is not well appreciated on this exam. Electronically Signed   By: Loraine Leriche  Lukens M.D.   On: 03/30/2017 10:30   Ct Biopsy  Result Date: 16-Apr-2017 INDICATION: Right anterior chest wall hematoma versus mass or abscess EXAM: CT-GUIDED BIOPSY RIGHT CHEST WALL ABNORMALITY MEDICATIONS: 1% lidocaine local ANESTHESIA/SEDATION: Moderate Sedation Time:  None. The patient was continuously monitored during the procedure by the interventional radiology nurse under my direct supervision. PROCEDURE: The procedure, risks, benefits, and alternatives were explained to the patient. Questions regarding the procedure were encouraged and answered. The patient understands and consents to the procedure. Previous imaging reviewed. Patient positioned supine. Noncontrast localization CT performed to demonstrate the right anterior chest wall mass/abnormality. Overlying skin marked for a medial approach. Under sterile conditions and local anesthesia, a 17 gauge coaxial guide needle was advanced from an anterior oblique approach into the chest wall abnormality. Needle position confirmed with CT. Syringe aspiration yielded dark bloody fluid. Attempts were made core biopsy. Sampling was scant and low yield. Of note, there is an adjacent acute right anterior rib fracture. Because of the poor sampling, chest wall hematoma is favored. Air within the soft tissue abnormality may be secondary to adjacent lung injury. Patient tolerated the procedure well without complication. Vital sign monitoring by nursing staff during the procedure will continue as patient is in the special procedures unit for post procedure observation. FINDINGS: The images document guide needle placement within the right chest wall abnormality. Post biopsy images demonstrate no significant interval change. COMPLICATIONS: None  immediate. IMPRESSION: Successful CT-guided aspiration and attempted core biopsy of the right chest wall abnormality. Sample sent for pathology, cytology, and culture. Chest wall hematoma is favored. Electronically Signed   By: Judie Petit.  Shick M.D.   On: Apr 16, 2017 16:40   Ir Paracentesis  Result Date: 04/16/17 INDICATION: Cirrhosis, ascites, abdominal distension EXAM: ULTRASOUND GUIDED PARACENTESIS MEDICATIONS: 1% lidocaine local COMPLICATIONS: None immediate. PROCEDURE: An ultrasound guided paracentesis was thoroughly discussed with the patient and questions answered. The benefits, risks, alternatives and complications were also discussed. The patient understands and wishes to proceed with the procedure. Written consent was obtained. Ultrasound was performed to localize and mark an adequate pocket of fluid in the right upper quadrant of the abdomen. The area was then prepped and draped in the normal sterile fashion. 1% Lidocaine was used for local anesthesia. Under ultrasound guidance a Safe-T-Centesis needle catheter was introduced. Paracentesis was performed. The catheter was removed and a dressing applied. FINDINGS: A total of approximately 2 L of clear ascitic fluid was removed. A fluid sample was sent for laboratory analysis. IMPRESSION: Successful ultrasound guided paracentesis yielding 2 L of ascites. Electronically Signed   By: Judie Petit.  Shick M.D.   On: Apr 16, 2017 16:52        Scheduled Meds: . aspirin EC  81 mg Oral Daily  . furosemide  40 mg Intravenous Q12H  . insulin aspart  0-15 Units Subcutaneous TID WC  . insulin aspart  0-5 Units Subcutaneous QHS  . insulin detemir  15 Units Subcutaneous QHS  . lactulose  30 g Oral TID  . multivitamin with minerals  1 tablet Oral Daily  . pantoprazole (PROTONIX) IV  40 mg Intravenous Q12H  . phytonadione  10 mg Subcutaneous Daily  . propranolol  20 mg Oral BID  . rifaximin  550 mg Oral BID  . sodium bicarbonate  25 mEq Intravenous Once  . sodium  chloride flush  3 mL Intravenous Q12H  . sodium polystyrene  45 g Rectal Once  . tamsulosin  0.4 mg Oral Daily  . venlafaxine  150 mg  Oral Q breakfast  . venlafaxine  75 mg Oral QHS   Continuous Infusions: . sodium chloride 250 mL (04/01/17 0000)  . ceFTAROline (TEFLARO) IV Stopped (04/01/17 0114)  .  sodium bicarbonate (isotonic) infusion in sterile water 75 mL/hr at 04/01/17 0114     LOS: 11 days    Time spent: 40 mins    Ramiro Harvest, MD [facility] Pager [phone]  If 7PM-7AM, please contact night-coverage [email or web address] Password [facility] 04/01/2017, 9:23 AM

## 2017-04-01 NOTE — Progress Notes (Signed)
PT Cancellation Note  Patient Details Name: Kurt BundeRichard S Tubbs MRN: [id number] DOB: 1950-04-24   Cancelled Treatment:    Reason Eval/Treat Not Completed: Medical issues which prohibited therapy; patient with medical decline.  Per RN will sign off.  Please consult again if needs arise.  Thanks.   Elray McgregorCynthia Amillia Biffle 04/01/2017, 4:35 PM  Sheran Lawlessyndi Railey Glad, PT [phone] 04/01/2017

## 2017-04-01 NOTE — Progress Notes (Signed)
Daily Progress Note   Patient Name: Kurt Reid       Date: 04/01/2017 DOB: 02-04-1951  Age: 67 y.o. MRN#: [id number] Attending Physician: Rodolph Bong, MD Primary Care Physician: [name], MD Admit Date: 03/10/2017  Reason for Consultation/Follow-up: Establishing goals of care  Subjective: Mr. Stolp remains unresponsive. Wife says he is not really responding to her at all today either.   Length of Stay: 11  Current Medications: Scheduled Meds:  . aspirin EC  81 mg Oral Daily  . furosemide  40 mg Intravenous Q12H  . insulin aspart  0-15 Units Subcutaneous TID WC  . insulin aspart  0-5 Units Subcutaneous QHS  . insulin detemir  15 Units Subcutaneous QHS  . lactulose  30 g Oral TID  . multivitamin with minerals  1 tablet Oral Daily  . pantoprazole (PROTONIX) IV  40 mg Intravenous Q12H  . phytonadione  10 mg Subcutaneous Daily  . propranolol  20 mg Oral BID  . rifaximin  550 mg Oral BID  . sodium bicarbonate  25 mEq Intravenous Once  . sodium chloride flush  3 mL Intravenous Q12H  . sodium polystyrene  45 g Rectal Once  . tamsulosin  0.4 mg Oral Daily  . venlafaxine  150 mg Oral Q breakfast  . venlafaxine  75 mg Oral QHS    Continuous Infusions: . sodium chloride 250 mL (04/01/17 0000)  . ceFTAROline (TEFLARO) IV Stopped (04/01/17 1120)  .  sodium bicarbonate (isotonic) infusion in sterile water 75 mL/hr at 04/01/17 1258    PRN Meds: sodium chloride, albuterol, gadobenate dimeglumine, guaiFENesin-dextromethorphan, LORazepam, metoCLOPramide **OR** metoCLOPramide (REGLAN) injection, metoprolol tartrate, morphine injection, ondansetron **OR** ondansetron (ZOFRAN) IV, opium-belladonna, oxyCODONE, sodium chloride flush  Physical Exam         Constitutional: He  appears well-developed. He has a sickly appearance.  HENT:  Head: Normocephalic and atraumatic.  Cardiovascular: Normal rate and regular rhythm.  Pulmonary/Chest: Effort normal. No accessory muscle usage. No tachypnea. No respiratory distress.  Abdominal: He exhibits distension and ascites.  Neurological: He is unresponsive.  Skin:  Jaundice   Nursing note and vitals reviewed.  Vital Signs: BP 112/74   Pulse 92   Temp 97.9 F (36.6 C) (Oral)   Resp 11   Ht 6\' 3"  (1.905 m)   Wt  105.4 kg (232 lb 5.8 oz)   SpO2 95%   BMI 29.04 kg/m  SpO2: SpO2: 95 % O2 Device: O2 Device: Nasal Cannula O2 Flow Rate: O2 Flow Rate (L/min): 3 L/min  Intake/output summary:   Intake/Output Summary (Last 24 hours) at 04/01/2017 1307 Last data filed at 04/01/2017 1200 Gross per 24 hour  Intake 2963.75 ml  Output 1730 ml  Net 1233.75 ml   LBM: Last BM Date: 03/29/17 Baseline Weight: Weight: 103.4 kg (228 lb) Most recent weight: Weight: 105.4 kg (232 lb 5.8 oz)       Palliative Assessment/Data: 20%    Flowsheet Rows     Most Recent Value  Intake Tab  Referral Department  Hospitalist  Unit at Time of Referral  Intermediate Care Unit  Palliative Care Primary Diagnosis  Sepsis/Infectious Disease  Date Notified  03/12/2017  Palliative Care Type  New Palliative care  Reason for referral  Clarify Goals of Care  Date of Admission  10-08-17  Date first seen by Palliative Care  03/29/2017  # of days Palliative referral response time  0 Day(s)  # of days IP prior to Palliative referral  10  Clinical Assessment  Psychosocial & Spiritual Assessment  Palliative Care Outcomes      Patient Active Problem List   Diagnosis Date Noted  . Palliative care encounter   . Hyperkalemia 03/30/2017  . Hyperbilirubinemia 03/30/2017  . ARF (acute renal failure) (HCC)   . Ascites   . Mass of chest wall, right   . Subcutaneous emphysema (HCC)   . Acute liver failure with hepatic coma (HCC)   . Acute urinary  retention 03/29/2017  . Staphylococcal arthritis of left ankle (HCC)   . Sepsis due to methicillin resistant Staphylococcus aureus (HCC)   . Abscess of upper lobe of right lung with pneumonia (HCC)   . Hematemesis 03/27/2017  . Ankle pain, left   . Left ankle effusion   . Delirium due to another medical condition 03/20/2017  . Leukocytosis 09/05/2017  . Sepsis (HCC) 09/05/2017  . Altered mental status 09/05/2017  . Lower urinary tract infectious disease 09/05/2017  . Abnormal MRI of abdomen 11/20/2016  . Skin lesion 05/22/2016  . Liver nodule 11/17/2015  . Gynecomastia 06/02/2014  . Hypotension 01/18/2014  . Dog bite of multiple sites of right hand and fingers 12/10/2013  . DM type 2 without retinopathy (HCC) 12/10/2013  . Chronic hepatitis C (HCC) 12/10/2013  . Dyslipidemia 12/10/2013  . Hemoptysis 06/05/2013  . Acquired ptosis of right eyelid 08/26/2012  . Left lumbar radiculopathy 08/09/2011  . Abdominal pain, RUQ 08/09/2011  . Abscess of abdominal wall 07/24/2011  . 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 Acute sinus infection 04/06/2011  . ADHD (attention deficit hyperactivity disorder) 04/06/2011  . Hepatic cirrhosis due to chronic hepatitis C infection (HCC) 11/23/2010  . Goals of care, counseling/discussion 07/23/2010  . Anxiety state 04/19/2009  . Esophageal varices (HCC) 04/19/2009  . DIVERTICULOSIS, COLON 04/19/2009  . INSOMNIA-SLEEP DISORDER-UNSPEC 04/19/2009  . Chronic pain syndrome 01/18/2009  . BENIGN PROSTATIC HYPERTROPHY 11/02/2007  . FATIGUE 11/02/2007  . NEPHROLITHIASIS, HX OF 11/02/2007  . BIPOLAR AFFECTIVE DISORDER 09/23/2006  . Depression 09/23/2006  . Allergic rhinitis 09/23/2006  . GERD 09/23/2006  . LOW BACK PAIN 09/23/2006  . Convulsions (HCC) 09/23/2006    Palliative Care Assessment & Plan   HPI: 67 y.o. male  with past medical history of bipolar, ADHD, anxiety, depression, liver cirrhosis, Hep C, esophageal  varices, HLD,  h/o seizures, DM admitted on 03/31/2017 with abd pain and confusion. 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 Unfortunately he continues to have worsening organ failure and functional status. Palliative care requested for GOC.   Assessment: I spoke today with Ms. Molner as she sits at bedside and holds her husband's hand providing him comfort. She seems to have good understanding why CRRT may not be the best option for him given his severe liver dysfunction as well. She comments that some of their children were requesting dialysis and says she believes they are still "in denial." She also speaks to how she is not ready to lose him yet but that his comfort is priority "over my selfishness." Attempted to discuss resuscitation in this frame of thinking but we were interrupted by visitors.   I returned to try and continue my conversation with Mrs. Passey but there are multiple visitors at bedside. Wife also avoids conversation with me. I did speak with daughter, Tresa Endo (who is EMT) and son's girlfriend with wife's permission. Had a frank conversation with them about liver and renal failure as well as extensive severe infection. We reviewed labs but I reiterated to them that even if WBC is trending down he is declining and not responding, not eating, not improving. Discussed that family needs to think about if dialysis would be the right thing for him if it will not improve the liver function and we need to seriously consider if he continues to decline if we should resuscitate him. Tresa Endo says that this would be up to her mother and she would be supportive of whatever decision her mother makes. I explained that her mother may need help with this decision.   Wife seems to understand prognosis but struggling with decisions.    Recommendations/Plan:  Hold NGT/Cortrak d/t esophageal varices - discussed with Dr. Janee Morn  Lactulose enemas if family desiring aggressive care  Holding on CRRT - explained to dtr that kidneys are functioning just enough for today but this could change tomorrow  Goals of Care and Additional Recommendations:  Limitations on Scope of Treatment: Full Scope Treatment  Code Status:  Full code  Prognosis:   Extremely poor with multiorgan failure and extensive MRSA infection.   Discharge Planning:  To Be Determined  Thank you for allowing the Palliative Medicine Team to assist in the care of this patient.   Total Time 60 min Prolonged Time Billed  no       Greater than 50%  of this time was spent counseling and coordinating care related to the above assessment and plan.  Yong Channel, NP Palliative Medicine Team Pager # [phone] (M-F 8a-5p) Team Phone # [phone] (Nights/Weekends)

## 2017-04-01 NOTE — Progress Notes (Signed)
[facility] Rounding Note  Subjective/Interval History:  On repeat labs last PM potassium  was down, CRRT not needed Pt remains deeply jaundiced K and bicarb ar better, creatinine continues to slowly rise Patient is unresponsive   Objective Vital signs in last 24 hours: Vitals:   04/01/17 0500 04/01/17 0800 04/01/17 0834 04/01/17 1000  BP: (!) 108/59 131/72  139/81  Pulse: 93 88  96  Resp: 12 14  15   Temp:  97.7 F (36.5 C)    TempSrc:  Oral    SpO2: 96% 95%  95%  Weight:   105.4 kg (232 lb 5.8 oz)   Height:       Weight change:   Intake/Output Summary (Last 24 hours) at 04/01/2017 1123 Last data filed at 04/01/2017 1100 Gross per 24 hour  Intake 2878.75 ml  Output 3730 ml  Net -851.25 ml   Physical Exam:  Blood pressure 139/81, pulse 96, temperature 97.7 F (36.5 C), temperature source Oral, resp. rate 15, height 6\' 3"  (1.905 m), weight 105.4 kg (232 lb 5.8 oz), SpO2 95 %.  Deeply  jaundiced, not responsive to me (just had morphine) Lines/tubes: foley with orange urine Crackles, R lateral and ant chest wall ? hematoma site of yesterday's biopsy).  Normal heart sounds S1S2 No S3 Distended abdomen, no focal tenderness; tight but not tense 2+ edema LE's to hips Foley orange urine   Recent Labs  Lab 03/27/17 0631 03/27/2017 0600 03/29/17 0632 03/30/17 0552 03/30/17 0924 03/30/17 1631 03/26/2017 0327 03/06/2017 1725 04/01/17 0338  NA 134* 136 131* 137  --   --  136 138 139  K 4.8 5.0 5.2* 5.5* 6.0* 6.0* 6.3* 5.3* 4.5  CL 109 108 107 113*  --   --  113* 112* 111  CO2 20* 21* 17* 17*  --   --  16* 18* 19*  GLUCOSE 174* 150* 160* 154*  --   --  233* 213* 182*  BUN 48* 52* 62* 74*  --   --  89* 90* 94*  CREATININE 1.30* 1.18 1.71* 2.20*  --   --  2.58* 2.96* 3.12*  CALCIUM 8.0* 8.0* 7.6* 7.8*  --   --  7.7* 7.7* 7.6*  PHOS  --   --   --   --   --   --   --  5.5*  --     Recent Labs  Lab 03/30/17 0552 03/07/2017 0327 03/10/2017 1725 04/01/17 0338  AST 152* 263*  --  200*   ALT 60 88*  --  88*  ALKPHOS 252* 231*  --  209*  BILITOT 11.7* 14.0*  --  13.4*  PROT 6.8 6.4*  --  6.6  ALBUMIN 1.6* 1.4* 1.4* 1.3*     Recent Labs  Lab 03/29/17 [id number] 03/30/17 0552 03/07/2017 0327 04/01/17 0338  WBC 31.2* 26.2* 26.4* 21.2*  NEUTROABS 27.1* 21.7* 22.4* 18.7*  HGB 12.7* 12.4* 12.4* 12.6*  HCT 37.5* 36.5* 36.9* 36.8*  MCV 90.8 91.5 92.3 90.9  PLT 217 182 159 122*    Recent Labs  Lab 03/04/2017 1125 03/06/2017 1432 03/12/2017 1704 03/06/2017 2122 04/01/17 0756  GLUCAP 239* 194* 198* 186* 171*   Studies/Results: Ct Biopsy  Result Date: 03/11/2017 INDICATION: Right anterior chest wall hematoma versus mass or abscess EXAM: CT-GUIDED BIOPSY RIGHT CHEST WALL ABNORMALITY MEDICATIONS: 1% lidocaine local ANESTHESIA/SEDATION: Moderate Sedation Time:  None. The patient was continuously monitored during the procedure by the interventional radiology nurse under my direct supervision. PROCEDURE: The procedure, risks,  benefits, and alternatives were explained to the patient. Questions regarding the procedure were encouraged and answered. The patient understands and consents to the procedure. Previous imaging reviewed. Patient positioned supine. Noncontrast localization CT performed to demonstrate the right anterior chest wall mass/abnormality. Overlying skin marked for a medial approach. Under sterile conditions and local anesthesia, a 17 gauge coaxial guide needle was advanced from an anterior oblique approach into the chest wall abnormality. Needle position confirmed with CT. Syringe aspiration yielded dark bloody fluid. Attempts were made core biopsy. Sampling was scant and low yield. Of note, there is an adjacent acute right anterior rib fracture. Because of the poor sampling, chest wall hematoma is favored. Air within the soft tissue abnormality may be secondary to adjacent lung injury. Patient tolerated the procedure well without complication. Vital sign monitoring by nursing staff  during the procedure will continue as patient is in the special procedures unit for post procedure observation. FINDINGS: The images document guide needle placement within the right chest wall abnormality. Post biopsy images demonstrate no significant interval change. COMPLICATIONS: None immediate. IMPRESSION: Successful CT-guided aspiration and attempted core biopsy of the right chest wall abnormality. Sample sent for pathology, cytology, and culture. Chest wall hematoma is favored. Electronically Signed   By: Judie Petit.  Shick M.D.   On: 04/02/2017 16:40   Ir Paracentesis  Result Date: 03/18/2017 INDICATION: Cirrhosis, ascites, abdominal distension EXAM: ULTRASOUND GUIDED PARACENTESIS MEDICATIONS: 1% lidocaine local COMPLICATIONS: None immediate. PROCEDURE: An ultrasound guided paracentesis was thoroughly discussed with the patient and questions answered. The benefits, risks, alternatives and complications were also discussed. The patient understands and wishes to proceed with the procedure. Written consent was obtained. Ultrasound was performed to localize and mark an adequate pocket of fluid in the right upper quadrant of the abdomen. The area was then prepped and draped in the normal sterile fashion. 1% Lidocaine was used for local anesthesia. Under ultrasound guidance a Safe-T-Centesis needle catheter was introduced. Paracentesis was performed. The catheter was removed and a dressing applied. FINDINGS: A total of approximately 2 L of clear ascitic fluid was removed. A fluid sample was sent for laboratory analysis. IMPRESSION: Successful ultrasound guided paracentesis yielding 2 L of ascites. Electronically Signed   By: Judie Petit.  Shick M.D.   On: 03/30/2017 16:52   Medications: . sodium chloride 250 mL (04/01/17 0000)  . ceFTAROline (TEFLARO) IV Stopped (04/01/17 1120)  .  sodium bicarbonate (isotonic) infusion in sterile water 75 mL/hr at 04/01/17 0114   . aspirin EC  81 mg Oral Daily  . furosemide  40 mg  Intravenous Q12H  . insulin aspart  0-15 Units Subcutaneous TID WC  . insulin aspart  0-5 Units Subcutaneous QHS  . insulin detemir  15 Units Subcutaneous QHS  . lactulose  30 g Oral TID  . multivitamin with minerals  1 tablet Oral Daily  . pantoprazole (PROTONIX) IV  40 mg Intravenous Q12H  . phytonadione  10 mg Subcutaneous Daily  . propranolol  20 mg Oral BID  . rifaximin  550 mg Oral BID  . sodium bicarbonate  25 mEq Intravenous Once  . sodium chloride flush  3 mL Intravenous Q12H  . sodium polystyrene  45 g Rectal Once  . tamsulosin  0.4 mg Oral Daily  . venlafaxine  150 mg Oral Q breakfast  . venlafaxine  75 mg Oral QHS    Background:   67 y.o. year-old male with PMH cirrhosis 2/2 hep C with protal hypertension, esophageal varices, DM, bipolar disorder. Admitted 1/18  with leukocytosis, hepatic encephalopathy, found to have metastatic MRSA infection with bacteremia, septic L ankle arthritis, UTI, chest mass R lung abscess/osteo/chest wall infection. Vanco started 1/19.  Noted to have rising creatinine early in the admission, peaked at 1.59 and was improving (down to creatinine of 1.18 on 1/25). Started rising again on 1/26 with low UOP and did not respond to IV fluids. Vanco trough noted to be 45 on 03/29/17 and vanco stopped. Cirrhosis decompensated and LFT's worsening, ^ abd distension. Lasix started 1/27. Foley replaced for UOP monitoring. Oliguric AKI. Hyperkalemia and metabolic acidosis.   Assessment/Recommendations  1. AKI - DDX of HRS , vancomycin toxicity (vanco levels 36-45), or simply septic/hemodynamic driven AKI in setting of metastatic MRSA infection. Had worsening hyperkalemia and acidosis 1/28 - now on a bicarb drip, K has come down and CRRT was not required. UOP up with IV lasix now. Creatinine does however continue to rise. No immediate family in right now - RN tells me that last night family was leaning away from dialysis (which given his severe liver  disease/decompensated cirrhosis I would totally support). Very high mortality rate now with renal failure and liver failure on top of metastatic MRSA infection.   2. Metastatic MRSA infection - teflaro and ID following. 3. Decompensated cirrhosis from Hep C - 2 liter paracentesis yesterday 4. Metabolic acidosis - on bicarb drip at 75/hour - once CO2 >20 would reduce rate 5. Hyperkalemia improved with bicarb and has had kayexalate past 2 days 6. Encephalopathy - pt not responsive to me at all (just had morphine)  Camille Bal, MD [facility] [phone] Pager 04/01/2017, 11:28 AM

## 2017-04-01 NOTE — Progress Notes (Signed)
[facility]    Date of Admission:  03/06/2017   Total days of antibiotics 12        Day 3 ceftaroline   ID: Kurt Reid is a 67 y.o. male with  Principal Problem:   Sepsis due to methicillin resistant Staphylococcus aureus (HCC) Active Problems:   BIPOLAR AFFECTIVE DISORDER   Chronic pain syndrome   Esophageal varices (HCC)   Goals of care, counseling/discussion   Hepatic cirrhosis due to chronic hepatitis C infection (HCC)   Hemoptysis   DM type 2 without retinopathy (HCC)   Chronic hepatitis C (HCC)   Leukocytosis   Sepsis (HCC)   Altered mental status   Lower urinary tract infectious disease   Delirium due to another medical condition   Ankle pain, left   Left ankle effusion   Hematemesis   Acute urinary retention   Staphylococcal arthritis of left ankle (HCC)   Abscess of upper lobe of right lung with pneumonia (HCC)   Hyperkalemia   Hyperbilirubinemia   ARF (acute renal failure) (HCC)   Ascites   Mass of chest wall, right   Subcutaneous emphysema (HCC)   Acute liver failure with hepatic coma (HCC)   Palliative care encounter    Subjective: Afebrile, but remains mostly unresponsive though has said a few words to family, he spontaneously moves extremities often raising his arms, uop slightly better lasix  Medications:  . aspirin EC  81 mg Oral Daily  . furosemide  40 mg Intravenous Q12H  . insulin aspart  0-15 Units Subcutaneous TID WC  . insulin aspart  0-5 Units Subcutaneous QHS  . insulin detemir  15 Units Subcutaneous QHS  . lactulose  30 g Oral TID  . multivitamin with minerals  1 tablet Oral Daily  . pantoprazole (PROTONIX) IV  40 mg Intravenous Q12H  . phytonadione  10 mg Subcutaneous Daily  . propranolol  20 mg Oral BID  . rifaximin  550 mg Oral BID  . sodium bicarbonate  25 mEq Intravenous Once  . sodium chloride flush  3 mL Intravenous Q12H  . sodium polystyrene  45 g Rectal Once  . tamsulosin  0.4 mg Oral Daily    . venlafaxine  150 mg Oral Q breakfast  . venlafaxine  75 mg Oral QHS    Objective: Vital signs in last 24 hours: Temp:  [97.6 F (36.4 C)-98 F (36.7 C)] 97.9 F (36.6 C) (01/29 1200) Pulse Rate:  [81-96] 92 (01/29 1200) Resp:  [11-22] 11 (01/29 1200) BP: (106-159)/(59-112) 112/74 (01/29 1200) SpO2:  [94 %-100 %] 95 % (01/29 1200) Weight:  [232 lb 5.8 oz (105.4 kg)] 232 lb 5.8 oz (105.4 kg) (01/29 0834)  Physical Exam  Constitutional: solomnence, not following commands He appears jaundice, chronically ill No distress.  HENT:  Mouth/Throat: Oropharynx is dry with thrush  Cardiovascular: Normal rate, regular rhythm and normal heart sounds. Exam reveals no gallop and no friction rub.  No murmur heard.  Pulmonary/Chest: Effort normal and breath sounds normal. No respiratory distress. He has no wheezes.  Abdominal: Soft. Bowel sounds are decreased. + distension. There is no tenderness.  Lymphadenopathy:  He has no cervical adenopathy.  Skin: jaundice Neuro_ asterisix      Lab Results Recent Labs    03/26/2017 0327 03/09/2017 1725 04/01/17 0338  WBC 26.4*  --  21.2*  HGB 12.4*  --  12.6*  HCT 36.9*  --  36.8*  NA 136 138 139  K  6.3* 5.3* 4.5  CL 113* 112* 111  CO2 16* 18* 19*  BUN 89* 90* 94*  CREATININE 2.58* 2.96* 3.12*   Liver Panel Recent Labs    03/30/17 0552 03/09/2017 0327 03/20/2017 1725 04/01/17 0338  PROT 6.8 6.4*  --  6.6  ALBUMIN 1.6* 1.4* 1.4* 1.3*  AST 152* 263*  --  200*  ALT 60 88*  --  88*  ALKPHOS 252* 231*  --  209*  BILITOT 11.7* 14.0*  --  13.4*  BILIDIR 6.6* 7.1*  --   --   IBILI 5.1* 6.9*  --   --     Microbiology: 12/8 culture-chest wall fluid collection - pedning 1/25 joint aspirate MRSA 1/23 synovial fluid aspirate MRSA 1/23 blood cx ngtd  Studies/Results: Ct Biopsy  Result Date: 03/12/2017 INDICATION: Right anterior chest wall hematoma versus mass or abscess EXAM: CT-GUIDED BIOPSY RIGHT CHEST WALL ABNORMALITY MEDICATIONS: 1%  lidocaine local ANESTHESIA/SEDATION: Moderate Sedation Time:  None. The patient was continuously monitored during the procedure by the interventional radiology nurse under my direct supervision. PROCEDURE: The procedure, risks, benefits, and alternatives were explained to the patient. Questions regarding the procedure were encouraged and answered. The patient understands and consents to the procedure. Previous imaging reviewed. Patient positioned supine. Noncontrast localization CT performed to demonstrate the right anterior chest wall mass/abnormality. Overlying skin marked for a medial approach. Under sterile conditions and local anesthesia, a 17 gauge coaxial guide needle was advanced from an anterior oblique approach into the chest wall abnormality. Needle position confirmed with CT. Syringe aspiration yielded dark bloody fluid. Attempts were made core biopsy. Sampling was scant and low yield. Of note, there is an adjacent acute right anterior rib fracture. Because of the poor sampling, chest wall hematoma is favored. Air within the soft tissue abnormality may be secondary to adjacent lung injury. Patient tolerated the procedure well without complication. Vital sign monitoring by nursing staff during the procedure will continue as patient is in the special procedures unit for post procedure observation. FINDINGS: The images document guide needle placement within the right chest wall abnormality. Post biopsy images demonstrate no significant interval change. COMPLICATIONS: None immediate. IMPRESSION: Successful CT-guided aspiration and attempted core biopsy of the right chest wall abnormality. Sample sent for pathology, cytology, and culture. Chest wall hematoma is favored. Electronically Signed   By: Judie Petit.  Shick M.D.   On: 03/20/2017 16:40   Ir Paracentesis  Result Date: 03/27/2017 INDICATION: Cirrhosis, ascites, abdominal distension EXAM: ULTRASOUND GUIDED PARACENTESIS MEDICATIONS: 1% lidocaine local  COMPLICATIONS: None immediate. PROCEDURE: An ultrasound guided paracentesis was thoroughly discussed with the patient and questions answered. The benefits, risks, alternatives and complications were also discussed. The patient understands and wishes to proceed with the procedure. Written consent was obtained. Ultrasound was performed to localize and mark an adequate pocket of fluid in the right upper quadrant of the abdomen. The area was then prepped and draped in the normal sterile fashion. 1% Lidocaine was used for local anesthesia. Under ultrasound guidance a Safe-T-Centesis needle catheter was introduced. Paracentesis was performed. The catheter was removed and a dressing applied. FINDINGS: A total of approximately 2 L of clear ascitic fluid was removed. A fluid sample was sent for laboratory analysis. IMPRESSION: Successful ultrasound guided paracentesis yielding 2 L of ascites. Electronically Signed   By: Judie Petit.  Shick M.D.   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 Continue with ceftaroline  aki = cr trending  upward - followed by renal team. Not requiring CRRT per nephrology and family understanding severity of illness  Encephalopathy/worsening liver function - remains essentially unchanged. tbili 13.4.   Peritonitis = likely secondary. Nothing on gram stain. Continue on ceftaroline which has some gnr coverage  Overall prognosis poor.spoek with family for 40 min  [facility] Cell: [phone] Pager: [phone]  04/01/2017, 2:39 PM

## 2017-04-02 DIAGNOSIS — J851 Abscess of lung with pneumonia: Secondary | ICD-10-CM

## 2017-04-02 DIAGNOSIS — K7201 Acute and subacute hepatic failure with coma: Secondary | ICD-10-CM

## 2017-04-02 DIAGNOSIS — K7460 Unspecified cirrhosis of liver: Secondary | ICD-10-CM

## 2017-04-02 DIAGNOSIS — N170 Acute kidney failure with tubular necrosis: Secondary | ICD-10-CM

## 2017-04-02 DIAGNOSIS — R188 Other ascites: Secondary | ICD-10-CM

## 2017-04-02 LAB — GLUCOSE, CAPILLARY
GLUCOSE-CAPILLARY: 98 mg/dL (ref 65–99)
GLUCOSE-CAPILLARY: 88 mg/dL (ref 65–99)
Glucose-Capillary: 85 mg/dL (ref 65–99)

## 2017-04-02 LAB — COMPREHENSIVE METABOLIC PANEL
ALT: 80 U/L — ABNORMAL HIGH (ref 17–63)
ANION GAP: 10 (ref 5–15)
AST: 177 U/L — ABNORMAL HIGH (ref 15–41)
Albumin: 1.3 g/dL — ABNORMAL LOW (ref 3.5–5.0)
Alkaline Phosphatase: 187 U/L — ABNORMAL HIGH (ref 38–126)
BUN: 105 mg/dL — ABNORMAL HIGH (ref 6–20)
CALCIUM: 7.3 mg/dL — AB (ref 8.9–10.3)
CO2: 23 mmol/L (ref 22–32)
Chloride: 109 mmol/L (ref 101–111)
Creatinine, Ser: 3.21 mg/dL — ABNORMAL HIGH (ref 0.61–1.24)
GFR calc non Af Amer: 19 mL/min — ABNORMAL LOW (ref >60)
GFR, EST AFRICAN AMERICAN: 21 mL/min — AB (ref >60)
GLUCOSE: 116 mg/dL — AB (ref 65–99)
POTASSIUM: 4.7 mmol/L (ref 3.5–5.1)
SODIUM: 142 mmol/L (ref 135–145)
Total Bilirubin: 14.3 mg/dL — ABNORMAL HIGH (ref 0.3–1.2)
Total Protein: 6.1 g/dL — ABNORMAL LOW (ref 6.5–8.1)

## 2017-04-02 LAB — ANAEROBIC CULTURE

## 2017-04-02 LAB — AMMONIA: Ammonia: 68 umol/L — ABNORMAL HIGH (ref 9–35)

## 2017-04-02 MED ORDER — DILTIAZEM HCL-DEXTROSE 100-5 MG/100ML-% IV SOLN (PREMIX)
5.0000 mg/h | INTRAVENOUS | Status: DC
  Administered 2017-04-02: 10 mg/h via INTRAVENOUS
  Administered 2017-04-02: 5 mg/h via INTRAVENOUS
  Administered 2017-04-02: 10 mg/h via INTRAVENOUS
  Filled 2017-04-02 (×3): qty 100

## 2017-04-02 MED ORDER — DILTIAZEM LOAD VIA INFUSION
10.0000 mg | Freq: Once | INTRAVENOUS | Status: AC
  Administered 2017-04-02: 10 mg via INTRAVENOUS
  Filled 2017-04-02: qty 10

## 2017-04-02 NOTE — Progress Notes (Signed)
   04/02/17 1200  Clinical Encounter Type  Visited With Patient and family together  Visit Type Initial;Psychological support;Spiritual support;Patient actively dying;Critical Care  Referral From Nurse  Consult/Referral To Chaplain  Spiritual Encounters  Spiritual Needs Emotional;Other (Comment);Grief support (Spiritual Care conversation/Support)  Stress Factors  Patient Stress Factors Not reviewed  Family Stress Factors Loss   I checked in with the family who were present at the bedside.  No needs were identified at this time.   Please, contact Spiritual Care for further assistance.   Chaplain Clint BolderBrittany Gaelyn Tukes M.Div., [facility]

## 2017-04-02 NOTE — Progress Notes (Signed)
ID PROGRESS NOTE Spoke with dr ghimire who mentioned patient has now developed afib with rvr, not responsive, with no improvement in kidney or liver function. [facility]. From ID standpoint, he is clinically deteriorating due to MRSA infection and complications with multi-organ failure. liver disease and kidney failure. Agree with the family's decision for comfort measurs  Will sign off.

## 2017-04-02 NOTE — Progress Notes (Signed)
[facility] Rounding Note  Subjective/Interval History:  Remains poorly responsive Now in afib and starting cardizem drip I spoke with wife and daughter Caryl AspKellie I agree that DNR and comfort care would be most appropriate here. I think wife coming around to that now.   Objective Vital signs in last 24 hours: Vitals:   04/02/17 0402 04/02/17 0415 04/02/17 0500 04/02/17 0600  BP:   116/66 125/75  Pulse:  93 92 91  Resp:  12 12 14   Temp: 97.7 F (36.5 C)     TempSrc: Axillary     SpO2:  95% 95% 98%  Weight:      Height:       Weight change:   Intake/Output Summary (Last 24 hours) at 04/02/2017 0613 Last data filed at 04/02/2017 0600 Gross per 24 hour  Intake 2940 ml  Output 1750 ml  Net 1190 ml   Physical Exam:  BP 125/75   Pulse 91   Temp 97.7 F (36.5 C) (Axillary)   Resp 14   Ht 6\' 3"  (1.905 m)   Wt 105.4 kg (232 lb 5.8 oz)   SpO2 98%   BMI 29.04 kg/m   Deeply  Jaundiced, groans some HR 160's Lines/tubes: foley with orange urine Crackles, R lateral and ant chest wall hematoma site of biopsy).  Normal heart sounds S1S2 No S3 Distended abdomen, no focal tenderness; tight but not tense 2+ edema LE's to hips Foley orange urine   Recent Labs  Lab 03/24/2017 0600 03/29/17 0632 03/30/17 0552 03/30/17 0924 03/30/17 1631 03/08/2017 0327 03/10/2017 1725 04/01/17 0338 04/02/17 0303  NA 136 131* 137  --   --  136 138 139 142  K 5.0 5.2* 5.5* 6.0* 6.0* 6.3* 5.3* 4.5 4.7  CL 108 107 113*  --   --  113* 112* 111 109  CO2 21* 17* 17*  --   --  16* 18* 19* 23  GLUCOSE 150* 160* 154*  --   --  233* 213* 182* 116*  BUN 52* 62* 74*  --   --  89* 90* 94* 105*  CREATININE 1.18 1.71* 2.20*  --   --  2.58* 2.96* 3.12* 3.21*  CALCIUM 8.0* 7.6* 7.8*  --   --  7.7* 7.7* 7.6* 7.3*  PHOS  --   --   --   --   --   --  5.5*  --   --     Recent Labs  Lab 03/24/2017 0327 03/30/2017 1725 04/01/17 0338 04/02/17 0303  AST 263*  --  200* 177*  ALT 88*  --  88* 80*  ALKPHOS 231*  --  209*  187*  BILITOT 14.0*  --  13.4* 14.3*  PROT 6.4*  --  6.6 6.1*  ALBUMIN 1.4* 1.4* 1.3* 1.3*     Recent Labs  Lab 03/29/17 [id number] 03/30/17 0552 03/05/2017 0327 04/01/17 0338  WBC 31.2* 26.2* 26.4* 21.2*  NEUTROABS 27.1* 21.7* 22.4* 18.7*  HGB 12.7* 12.4* 12.4* 12.6*  HCT 37.5* 36.5* 36.9* 36.8*  MCV 90.8 91.5 92.3 90.9  PLT 217 182 159 122*    Recent Labs  Lab 03/16/2017 2122 04/01/17 0756 04/01/17 1216 04/01/17 1604 04/01/17 2135  GLUCAP 186* 171* 124* 121* 127*   Studies/Results: Ct Biopsy  Result Date: 03/26/2017 INDICATION: Right anterior chest wall hematoma versus mass or abscess EXAM: CT-GUIDED BIOPSY RIGHT CHEST WALL ABNORMALITY MEDICATIONS: 1% lidocaine local ANESTHESIA/SEDATION: Moderate Sedation Time:  None. The patient was continuously monitored during the procedure by the interventional  radiology nurse under my direct supervision. PROCEDURE: The procedure, risks, benefits, and alternatives were explained to the patient. Questions regarding the procedure were encouraged and answered. The patient understands and consents to the procedure. Previous imaging reviewed. Patient positioned supine. Noncontrast localization CT performed to demonstrate the right anterior chest wall mass/abnormality. Overlying skin marked for a medial approach. Under sterile conditions and local anesthesia, a 17 gauge coaxial guide needle was advanced from an anterior oblique approach into the chest wall abnormality. Needle position confirmed with CT. Syringe aspiration yielded dark bloody fluid. Attempts were made core biopsy. Sampling was scant and low yield. Of note, there is an adjacent acute right anterior rib fracture. Because of the poor sampling, chest wall hematoma is favored. Air within the soft tissue abnormality may be secondary to adjacent lung injury. Patient tolerated the procedure well without complication. Vital sign monitoring by nursing staff during the procedure will continue as patient  is in the special procedures unit for post procedure observation. FINDINGS: The images document guide needle placement within the right chest wall abnormality. Post biopsy images demonstrate no significant interval change. COMPLICATIONS: None immediate. IMPRESSION: Successful CT-guided aspiration and attempted core biopsy of the right chest wall abnormality. Sample sent for pathology, cytology, and culture. Chest wall hematoma is favored. Electronically Signed   By: Judie Petit.  Shick M.D.   On: 04/02/2017 16:40   Ir Paracentesis  Result Date: 03/13/2017 INDICATION: Cirrhosis, ascites, abdominal distension EXAM: ULTRASOUND GUIDED PARACENTESIS MEDICATIONS: 1% lidocaine local COMPLICATIONS: None immediate. PROCEDURE: An ultrasound guided paracentesis was thoroughly discussed with the patient and questions answered. The benefits, risks, alternatives and complications were also discussed. The patient understands and wishes to proceed with the procedure. Written consent was obtained. Ultrasound was performed to localize and mark an adequate pocket of fluid in the right upper quadrant of the abdomen. The area was then prepped and draped in the normal sterile fashion. 1% Lidocaine was used for local anesthesia. Under ultrasound guidance a Safe-T-Centesis needle catheter was introduced. Paracentesis was performed. The catheter was removed and a dressing applied. FINDINGS: A total of approximately 2 L of clear ascitic fluid was removed. A fluid sample was sent for laboratory analysis. IMPRESSION: Successful ultrasound guided paracentesis yielding 2 L of ascites. Electronically Signed   By: Judie Petit.  Shick M.D.   On: 03/10/2017 16:52   Medications: . sodium chloride 250 mL (04/01/17 0000)  . ceFTAROline (TEFLARO) IV Stopped (04/01/17 2354)  .  sodium bicarbonate (isotonic) infusion in sterile water 75 mL/hr at 04/01/17 2228   . aspirin EC  81 mg Oral Daily  . furosemide  40 mg Intravenous Q12H  . insulin aspart  0-15 Units  Subcutaneous TID WC  . insulin aspart  0-5 Units Subcutaneous QHS  . insulin detemir  15 Units Subcutaneous QHS  . lactulose  300 mL Rectal Daily  . multivitamin with minerals  1 tablet Oral Daily  . pantoprazole (PROTONIX) IV  40 mg Intravenous Q12H  . phytonadione  10 mg Subcutaneous Daily  . propranolol  20 mg Oral BID  . rifaximin  550 mg Oral BID  . sodium bicarbonate  25 mEq Intravenous Once  . sodium chloride flush  3 mL Intravenous Q12H  . sodium polystyrene  45 g Rectal Once  . tamsulosin  0.4 mg Oral Daily  . venlafaxine  150 mg Oral Q breakfast  . venlafaxine  75 mg Oral QHS    Background:   67 y.o. year-old male with PMH cirrhosis 2/2 hep C  with protal hypertension, esophageal varices, DM, bipolar disorder. Admitted 1/18 with leukocytosis, hepatic encephalopathy, found to have metastatic MRSA infection with bacteremia, septic L ankle arthritis, UTI, chest mass R lung abscess/osteo/chest wall infection. Vanco started 1/19.  Noted to have rising creatinine early in the admission, peaked at 1.59 and was improving (down to creatinine of 1.18 on 1/25). Started rising again on 1/26 with low UOP and did not respond to IV fluids. Vanco trough noted to be 45 on 03/29/17 and vanco stopped. Cirrhosis decompensated and LFT's worsening, ^ abd distension. Lasix started 1/27. Foley replaced for UOP monitoring. Initially oliguric AKI. Hyperkalemia and metabolic acidosis.   Assessment/Recommendations  1. AKI - DDX of HRS , vancomycin toxicity (vanco levels 36-45), or simply septic/hemodynamic driven AKI in setting of metastatic MRSA infection. Had worsening hyperkalemia and acidosis 1/28 - on bicarb drip, K down and CRRT was not required. Nonoliguric with  IV lasix. Creatinine still inching up some. Very high mortality rate now with renal failure and liver failure on top of metastatic MRSA infection.   2. Metastatic MRSA infection - teflaro and ID following. 3. Decompensated cirrhosis from Hep C -  2 liter paracentesis 1/28 4. Metabolic acidosis - on bicarb drip at 75/hour - OK to reduce rate at this point with CO2 up to 23. (I reduced to 50) 5. Hyperkalemia improved and so far remains under 5 6. Encephalopathy 7. Atrial fib - new onset. Cardizem drip  8. Disposition - I would agree with DNR and comfort care. I think wife and daughter are coming around to that.   Camille Bal, MD [facility] [phone] Pager 04/02/2017, 6:13 AM

## 2017-04-02 NOTE — Progress Notes (Addendum)
Heart rate still in the 120s-130s with 10 mg of Cardizem infusion-unfortunately blood pressure soft in the 90s.  Spoke with cardiology-Dr. Elease HashimotoNahser -given his decompensated liver failure-and worsening renal function-we do not think that he is a good candidate for amiodarone or digoxin.  Really no other alternatives at this time-recommendations from cardiology are to continue what we are doing to treat underlying sepsis.  Difficult situation, will discuss with family later today.

## 2017-04-02 NOTE — Progress Notes (Signed)
[facility] Brief Note Family has decided to make pt DNR, with comfort care. Agree with this 2/2 multisystem failure 2/2 MRSA infection As such, will sign off.  Camille Balynthia Branna Cortina, MD [facility] [phone] Pager 04/02/2017, 6:27 PM

## 2017-04-02 NOTE — Progress Notes (Signed)
Daily Progress Note   Patient Name: Kurt Reid       Date: 04/02/2017 DOB: 1950-12-23  Age: 67 y.o. MRN#: [id number] Attending Physician: Jonetta Osgood, MD Primary Care Physician: Biagio Borg, MD Admit Date: 03/19/2017  Reason for Consultation/Follow-up: Establishing goals of care  Subjective: Mr. Frix is worse today and on cardizem infusion. Unresponsive.   Length of Stay: 12  Current Medications: Scheduled Meds:  . aspirin EC  81 mg Oral Daily  . furosemide  40 mg Intravenous Q12H  . insulin aspart  0-15 Units Subcutaneous TID WC  . insulin aspart  0-5 Units Subcutaneous QHS  . insulin detemir  15 Units Subcutaneous QHS  . lactulose  300 mL Rectal Daily  . multivitamin with minerals  1 tablet Oral Daily  . pantoprazole (PROTONIX) IV  40 mg Intravenous Q12H  . phytonadione  10 mg Subcutaneous Daily  . propranolol  20 mg Oral BID  . rifaximin  550 mg Oral BID  . sodium bicarbonate  25 mEq Intravenous Once  . sodium chloride flush  3 mL Intravenous Q12H  . sodium polystyrene  45 g Rectal Once  . tamsulosin  0.4 mg Oral Daily  . venlafaxine  150 mg Oral Q breakfast  . venlafaxine  75 mg Oral QHS    Continuous Infusions: . sodium chloride 250 mL (04/01/17 0000)  . ceFTAROline (TEFLARO) IV Stopped (04/01/17 2354)  . diltiazem (CARDIZEM) infusion 5 mg/hr (04/02/17 0752)  .  sodium bicarbonate (isotonic) infusion in sterile water 75 mL/hr at 04/01/17 2228    PRN Meds: sodium chloride, albuterol, guaiFENesin-dextromethorphan, LORazepam, metoCLOPramide **OR** metoCLOPramide (REGLAN) injection, metoprolol tartrate, morphine injection, ondansetron **OR** ondansetron (ZOFRAN) IV, opium-belladonna, oxyCODONE, sodium chloride flush  Physical Exam         Constitutional:  He appears well-developed. He has a sickly appearance.  HENT:  Head: Normocephalic and atraumatic.  Cardiovascular: Tachycardic, atrial fibrillation Pulmonary/Chest: Effort normal. No accessory muscle usage. No tachypnea. No respiratory distress.  Abdominal: He exhibits distension and ascites.  Neurological: He is unresponsive.  Skin:  Jaundice   Nursing note and vitals reviewed.  Vital Signs: BP 92/72 (BP Location: Right Arm)   Pulse 91   Temp 97.7 F (36.5 C) (Axillary)   Resp 15   Ht _0  (1.905 m)  Wt 105.4 kg (232 lb 5.8 oz)   SpO2 (!) 86%   BMI 29.04 kg/m  SpO2: SpO2: (!) 86 % O2 Device: O2 Device: Nasal Cannula O2 Flow Rate: O2 Flow Rate (L/min): 3 L/min  Intake/output summary:   Intake/Output Summary (Last 24 hours) at 04/02/2017 0855 Last data filed at 04/02/2017 0800 Gross per 24 hour  Intake 2775 ml  Output 1750 ml  Net 1025 ml   LBM: Last BM Date: 03/29/17 Baseline Weight: Weight: 103.4 kg (228 lb) Most recent weight: Weight: 105.4 kg (232 lb 5.8 oz)       Palliative Assessment/Data: 20%    Flowsheet Rows     Most Recent Value  Intake Tab  Referral Department  Hospitalist  Unit at Time of Referral  Intermediate Care Unit  Palliative Care Primary Diagnosis  Sepsis/Infectious Disease  Date Notified  03/05/2017  Palliative Care Type  New Palliative care  Reason for referral  Clarify Goals of Care  Date of Admission  03/27/2017  Date first seen by Palliative Care  03/11/2017  # of days Palliative referral response time  0 Day(s)  # of days IP prior to Palliative referral  10  Clinical Assessment  Psychosocial & Spiritual Assessment  Palliative Care Outcomes      Patient Active Problem List   Diagnosis Date Noted  . Palliative care encounter   . Hyperkalemia 03/30/2017  . Hyperbilirubinemia 03/30/2017  . ARF (acute renal failure) ([place])   . Ascites   . Mass of chest wall, right   . Subcutaneous emphysema ([place])   . Acute liver failure with  hepatic coma ([place])   . Acute urinary retention 03/29/2017  . Staphylococcal arthritis of left ankle ([place])   . Sepsis due to methicillin resistant Staphylococcus aureus ([place])   . Abscess of upper lobe of right lung with pneumonia ([place])   . Hematemesis 03/27/2017  . Ankle pain, left   . Left ankle effusion   . Delirium due to another medical condition 03/06/2017  . Leukocytosis 03/07/2017  . Sepsis ([employer]) 03/19/2017  . Altered mental status 03/18/2017  . Lower urinary tract infectious disease 03/22/2017  . Abnormal MRI of abdomen 11/20/2016  . Skin lesion 05/22/2016  . Liver nodule 11/17/2015  . Gynecomastia 06/02/2014  . Hypotension 01/18/2014  . Dog bite of multiple sites of right hand and fingers 12/10/2013  . DM type 2 without retinopathy ([place]) 12/10/2013  . Chronic hepatitis C ([place]) 12/10/2013  . Dyslipidemia 12/10/2013  . Hemoptysis 06/05/2013  . Acquired ptosis of right eyelid 08/26/2012  . Left lumbar radiculopathy 08/09/2011  . Abdominal pain, RUQ 08/09/2011  . Abscess of abdominal wall 07/24/2011  . 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 Acute sinus infection 04/06/2011  . ADHD (attention deficit hyperactivity disorder) 04/06/2011  . Hepatic cirrhosis due to chronic hepatitis C infection ([place]) 11/23/2010  . Goals of care, counseling/discussion 07/23/2010  . Anxiety state 04/19/2009  . Esophageal varices ([place]) 04/19/2009  . DIVERTICULOSIS, COLON 04/19/2009  . INSOMNIA-SLEEP DISORDER-UNSPEC 04/19/2009  . Chronic pain syndrome 01/18/2009  . BENIGN PROSTATIC HYPERTROPHY 11/02/2007  . FATIGUE 11/02/2007  . NEPHROLITHIASIS, HX OF 11/02/2007  . BIPOLAR AFFECTIVE DISORDER 09/23/2006  . Depression 09/23/2006  . Allergic rhinitis 09/23/2006  . GERD 09/23/2006  . LOW BACK PAIN 09/23/2006  . Convulsions ([place]) 09/23/2006    Palliative Care Assessment & Plan   HPI: 67 y.o. male  with past medical history of bipolar, ADHD, anxiety,  depression, liver cirrhosis, Hep C,  esophageal varices, HLD, h/o seizures, DM admitted on 03/22/2017 with abd pain and confusion. 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 Unfortunately he continues to have worsening organ failure and functional status. Palliative care requested for [place].   Assessment: I met and spoke with wife at bedside. We discussed that we really need to talk about if she desires DNR for him. She does not answer me.   I came back about an hour later and their children are in the hospital and multiple family/friends at bedside. They verify that they do want to make him DNR at this time. They are clear that they desire to continue other interventions and current therapies at this time but also that his comfort is their first priority. I reassured them that we would continue antibiotics and cardizem and they would remain in ICU. I also educated that they let the nurse know if he appears uncomfortable and if medications needed more frequently or dosage adjustment that the nurse should contact a provider - he should not suffer at all. Emotional support provided.   Multiple people in the room reassured that we are not "stopping everything" and "moving him out." Anticipate that even with current therapies he will pass within next few days.   Recommendations/Plan:  Main focus is comfort  Continuing some interventions for family comfort  Lactulose enema d/c as he cannot retain and he is very uncomfortable even with brief movement - will cause much discomfort  Goals of Care and Additional Recommendations:  Limitations on Scope of Treatment: Moving towards comfort care  Code Status:  DNR  Prognosis:   Extremely poor with multiorgan failure and extensive MRSA infection. Hours to days.   Discharge Planning:  Anticipate  hospital death.   Thank you for allowing the Palliative Medicine Team to assist in the care of this patient.   Total Time 60 min Prolonged Time Billed  no       Greater than 50%  of this time was spent counseling and coordinating care related to the above assessment and plan.  Vinie Sill, NP Palliative Medicine Team Pager # [phone] (M-F 8a-5p) Team Phone # [phone] (Nights/Weekends)

## 2017-04-02 NOTE — Progress Notes (Signed)
PROGRESS NOTE        PATIENT DETAILS Name: Kurt Reid Age: 67 y.o. Sex: male Date of Birth: January 18, 1951 Admit Date: 10-Apr-2017 Admitting Physician Courage Mariea Clonts, MD ZOX:WRUE, Len Blalock, MD  Brief Narrative: Patient is a 67 y.o. male with history of cirrhosis secondary to hepatitis C/portal hypertension/esophageal varices-admitted with confusion-further evaluation revealed disseminated MRSA infection.  Subsequent hospital course complicated by worsening encephalopathy, acute kidney injury, ongoing failure to thrive syndrome now lately A. fib with RVR.  Prognosis felt to be very poor-palliative care following.  See below for further details  Subjective: Arousable-but does not open his eyes.  He looks to be cachectic/chronically sick appearing.  Does not follow many commands.  Spouse at bedside.  He is in A. fib with RVR this morning   Assessment/Plan: Sepsis secondary to disseminated MRSA infection with left ankle septic arthritis: Continues to worsen-transthoracic echocardiogram negative for vegetation, not felt to be a good candidate for TEE given tenuous medical condition.  Underwent left ankle aspiration by IR with cultures positive for MRSA, subsequently underwent irrigation and debridement by orthopedics on 1/25.  Last set of blood cultures on 1/23.  Vancomycin discontinued due to acute kidney injury-remains on Teflaro.  Infectious disease following.  Given prolonged hospital stay with continued decline in spite of aggressive antimicrobial therapy/treatment-best served by transition to comfort measures.  Spoke with spouse at length at bedside this morning-recommended DNR and transitioning to comfort care.  Right upper lobe mass infiltrating through the right chest wall into the extraosseous tissues: Suspected related to disseminated MRSA infection, underwent a CT-guided aspiration by interventional radiology on 1/28-cultures continue to be negative-awaiting  cytology/biopsy.  Means on empiric antimicrobial therapy.  Acute kidney injury: Thought to be either due to vancomycin toxicity or hemodynamically mediated due to disseminated MRSA infection.  Given overall poor prognosis-agree that he is not a candidate for aggressive care including renal replacement therapy.  Creatinine continues to worsen-nephrology following.  Decompensated liver cirrhosis: Underwent 2 L paracentesis on 1/28-abdomen is distended but not tense-remains on Lasix.  Follow for now.  Acute metabolic encephalopathy: Felt to be multifactorial-could have underlying hepatic encephalopathy and encephalopathy secondary to disseminated MRSA infection.  Recheck ammonia levels.  Continue supportive care-if ammonia levels are still significantly elevated-we will need to change the lactulose dosing.  Unable to tolerate significant oral intake due to significant encephalopathy.  A. fib with RVR: Occurred on 1/30-starting Cardizem infusion if blood pressure tolerates.  Doubt he is a good candidate for long-term anticoagulation-his INR at this time.  Given elevated LFTs/decompensated liver cirrhosis-would avoid amiodarone.  Insulin-dependent DM-2: CBG stable with SSI and Levemir.  Follow and adjust accordingly.  Hemoptysis: Secondary to disseminated MRSA infection-with septic emboli to the lungs further workup is required.  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  Ethics/palliative care: Unfortunate 67 year old with liver cirrhosis-admitted with sepsis secondary to disseminated MRSA infection-hospital course complicated by encephalopathy, acute kidney injury, A. fib with RVR.  He continues to worsen in spite of aggressive therapy-and prolonged hospital stay.  He appears in very poor shape-his prognosis is very grim.  Long discussion with the patient spouse at bedside-DNR  recommended-recommendations were also to consider transitioning to comfort measures at this time.  Palliative care to follow later today.  Telemetry (independently reviewed):  A. fib with RVR  DVT Prophylaxis: SCD's  Code Status: Full code  Family Communication: Spouse at bedside  Disposition Plan: Remain inpatient-either residential hospice or SNF on discharge.  Antimicrobial agents: Anti-infectives (From admission, onward)   Start     Dose/Rate Route Frequency Ordered Stop   03/30/17 2200  ceftaroline (TEFLARO) 400 mg in sodium chloride 0.9 % 250 mL IVPB    Comments:  Pharmacy please review and ADJUST DOSE   400 mg 250 mL/hr over 60 Minutes Intravenous Every 12 hours 03/30/17 1607     03/29/17 2200  vancomycin (VANCOCIN) 1,250 mg in sodium chloride 0.9 % 250 mL IVPB  Status:  Discontinued     1,250 mg 166.7 mL/hr over 90 Minutes Intravenous Every 24 hours 03/29/17 1341 03/29/17 1746   04/02/2017 2200  vancomycin (VANCOCIN) 1,750 mg in sodium chloride 0.9 % 500 mL IVPB  Status:  Discontinued     1,750 mg 250 mL/hr over 120 Minutes Intravenous Every 24 hours 03/29/2017 0856 03/29/17 1341   03/10/2017 1603  vancomycin (VANCOCIN) powder  Status:  Discontinued       As needed 04/01/2017 1603 03/29/2017 1626   03/27/17 2200  rifaximin (XIFAXAN) tablet 550 mg     550 mg Oral 2 times daily 03/27/17 1923     03/26/17 0800  vancomycin (VANCOCIN) 1,500 mg in sodium chloride 0.9 % 500 mL IVPB  Status:  Discontinued     1,500 mg 250 mL/hr over 120 Minutes Intravenous Every 24 hours 03/21/2017 1102 03/13/2017 0856   03/22/17 0600  vancomycin (VANCOCIN) 1,750 mg in sodium chloride 0.9 % 500 mL IVPB  Status:  Discontinued     1,750 mg 250 mL/hr over 120 Minutes Intravenous Every 24 hours 03/22/17 0547 03/24/2017 1102   2017-04-10 2000  cefTRIAXone (ROCEPHIN) 2 g in dextrose 5 % 50 mL IVPB  Status:  Discontinued     2 g 100 mL/hr over 30 Minutes Intravenous Every 24 hours 04/10/17 1743 03/24/17 1421       Procedures:  Ultrasound-guided aspiration left ankle joint effusion Per Dr. Lowella Dandy interventional radiology 03/26/2017  Irrigation and debridement of left ankle per Dr. August Saucer 04/01/2017  CT-guided right chest wall aspiration and biopsy attempt per Dr. Denny Levy 03/24/2017  Ultrasound-guided paracentesis 04/01/2017--2 L ascitic fluid removed per Dr. Denny Levy   CONSULTS:   Infectious disease: Dr. Drue Second 03/22/2017  Orthopedics Dr. August Saucer 03/23/2017  Psychiatry: Dr. Sharma Covert 03/16/2017  Pulmonary : Dr. Vassie Loll 03/25/2017  Gastroenterology: Dr. Russella Dar 03/27/2017  General surgery: Dr. Johna Sheriff 03/22/2017  Cardiothoracic surgery: Dr. Zenaida Niece trigt 03/29/2017  Nephrology Dr. Eliott Nine 03/07/2017  Palliative care: Yong Channel, NP 03/17/2017     Time spent: 45  minutes-Greater than 50% of this time was spent in counseling, explanation of diagnosis, planning of further management, and coordination of care.  MEDICATIONS: Scheduled Meds: . aspirin EC  81 mg Oral Daily  . diltiazem  10 mg Intravenous Once  . furosemide  40 mg Intravenous Q12H  . insulin aspart  0-15 Units Subcutaneous TID WC  . insulin aspart  0-5 Units Subcutaneous QHS  . insulin detemir  15 Units Subcutaneous QHS  . lactulose  300 mL Rectal Daily  . multivitamin with minerals  1 tablet Oral Daily  . pantoprazole (PROTONIX) IV  40 mg Intravenous Q12H  . phytonadione  10 mg Subcutaneous Daily  . propranolol  20 mg Oral BID  . rifaximin  550 mg Oral BID  . sodium bicarbonate  25 mEq Intravenous Once  .  sodium chloride flush  3 mL Intravenous Q12H  . sodium polystyrene  45 g Rectal Once  . tamsulosin  0.4 mg Oral Daily  . venlafaxine  150 mg Oral Q breakfast  . venlafaxine  75 mg Oral QHS   Continuous Infusions: . sodium chloride 250 mL (04/01/17 0000)  . ceFTAROline (TEFLARO) IV Stopped (04/01/17 2354)  . diltiazem (CARDIZEM) infusion    .  sodium bicarbonate (isotonic) infusion in sterile water 75 mL/hr at 04/01/17 2228   PRN  Meds:.sodium chloride, albuterol, guaiFENesin-dextromethorphan, LORazepam, metoCLOPramide **OR** metoCLOPramide (REGLAN) injection, metoprolol tartrate, morphine injection, ondansetron **OR** ondansetron (ZOFRAN) IV, opium-belladonna, oxyCODONE, sodium chloride flush   PHYSICAL EXAM: Vital signs: Vitals:   04/02/17 0402 04/02/17 0415 04/02/17 0500 04/02/17 0600  BP:   116/66 125/75  Pulse:  93 92 91  Resp:  12 12 14   Temp: 97.7 F (36.5 C)     TempSrc: Axillary     SpO2:  95% 95% 98%  Weight:      Height:       Filed Weights   03/30/17 0543 03/30/17 1422 04/01/17 0834  Weight: 111.1 kg (244 lb 14.9 oz) 108.8 kg (239 lb 13.8 oz) 105.4 kg (232 lb 5.8 oz)   Body mass index is 29.04 kg/m.   General appearance :Awake-does not open eyes-responds to simple commands only.  Chronically sick appearing-very frail. Eyes:Pink conjunctiva HEENT: Atraumatic and Normocephalic Neck: supple, no JVD. Resp:Good air entry bilaterally, no added sounds anteriorly CVS: S1 S2 regular rate-tachycardic GI: Bowel sounds present, Non tender-distended but not tense.   Extremities: B/L Lower Ext shows +edema, both legs are warm to touch Neurology: Generalized weakness-difficult exam due to encephalopathy. Psychiatric: Encephalopathic-unable to evaluate  musculoskeletal:No digital cyanosis Skin:No Rash, warm and dry Wounds:N/A  I have personally reviewed following labs and imaging studies  LABORATORY DATA: CBC: Recent Labs  Lab 04/01/2017 0600 03/29/17 0632 03/30/17 0552 03/06/2017 0327 04/01/17 0338  WBC 29.1* 31.2* 26.2* 26.4* 21.2*  NEUTROABS 25.9* 27.1* 21.7* 22.4* 18.7*  HGB 12.5* 12.7* 12.4* 12.4* 12.6*  HCT 37.2* 37.5* 36.5* 36.9* 36.8*  MCV 90.7 90.8 91.5 92.3 90.9  PLT 160 217 182 159 122*    Basic Metabolic Panel: Recent Labs  Lab 03/30/17 0552  03/30/17 1631 03/20/2017 0327 03/23/2017 1725 04/01/17 0338 04/02/17 0303  NA 137  --   --  136 138 139 142  K 5.5*   < > 6.0* 6.3* 5.3*  4.5 4.7  CL 113*  --   --  113* 112* 111 109  CO2 17*  --   --  16* 18* 19* 23  GLUCOSE 154*  --   --  233* 213* 182* 116*  BUN 74*  --   --  89* 90* 94* 105*  CREATININE 2.20*  --   --  2.58* 2.96* 3.12* 3.21*  CALCIUM 7.8*  --   --  7.7* 7.7* 7.6* 7.3*  MG  --   --   --  2.5*  --   --   --   PHOS  --   --   --   --  5.5*  --   --    < > = values in this interval not displayed.    GFR: Estimated Creatinine Clearance: 29.3 mL/min (A) (by C-G formula based on SCr of 3.21 mg/dL (H)).  Liver Function Tests: Recent Labs  Lab 03/04/2017 0600 03/30/17 0552 03/15/2017 0327 03/19/2017 1725 04/01/17 0338 04/02/17 0303  AST 123* 152* 263*  --  200* 177*  ALT 55 60 88*  --  88* 80*  ALKPHOS 296* 252* 231*  --  209* 187*  BILITOT 9.2* 11.7* 14.0*  --  13.4* 14.3*  PROT 6.5 6.8 6.4*  --  6.6 6.1*  ALBUMIN 1.5* 1.6* 1.4* 1.4* 1.3* 1.3*   No results for input(s): LIPASE, AMYLASE in the last 168 hours. Recent Labs  Lab 03/30/17 1631  AMMONIA 82*    Coagulation Profile: Recent Labs  Lab 03/30/17 0924 03/12/2017 0327 04/01/17 0338  INR 1.71 1.92 1.92    Cardiac Enzymes: No results for input(s): CKTOTAL, CKMB, CKMBINDEX, TROPONINI in the last 168 hours.  BNP (last 3 results) No results for input(s): PROBNP in the last 8760 hours.  HbA1C: No results for input(s): HGBA1C in the last 72 hours.  CBG: Recent Labs  Lab 04/01/17 0756 04/01/17 1216 04/01/17 1604 04/01/17 2135 04/02/17 0729  GLUCAP 171* 124* 121* 127* 98    Lipid Profile: No results for input(s): CHOL, HDL, LDLCALC, TRIG, CHOLHDL, LDLDIRECT in the last 72 hours.  Thyroid Function Tests: No results for input(s): TSH, T4TOTAL, FREET4, T3FREE, THYROIDAB in the last 72 hours.  Anemia Panel: No results for input(s): VITAMINB12, FOLATE, FERRITIN, TIBC, IRON, RETICCTPCT in the last 72 hours.  Urine analysis:    Component Value Date/Time   COLORURINE AMBER (A) 04/02/2017 0940   APPEARANCEUR HAZY (A) 03/10/2017  0940   LABSPEC 1.010 03/12/2017 0940   PHURINE 5.0 03/15/2017 0940   GLUCOSEU NEGATIVE 04/02/2017 0940   GLUCOSEU 500 (A) 10/27/2014 1429   HGBUR MODERATE (A) 03/25/2017 0940   BILIRUBINUR NEGATIVE 03/29/2017 0940   KETONESUR NEGATIVE 03/05/2017 0940   PROTEINUR NEGATIVE 03/30/2017 0940   UROBILINOGEN 1.0 10/27/2014 1429   NITRITE POSITIVE (A) 03/26/2017 0940   LEUKOCYTESUR TRACE (A) 03/29/2017 0940    Sepsis Labs: Lactic Acid, Venous    Component Value Date/Time   LATICACIDVEN 2.74 (HH) 03/10/2017 1503    MICROBIOLOGY: Recent Results (from the past 240 hour(s))  MRSA PCR Screening     Status: Abnormal   Collection Time: 03/10/2017  8:10 AM  Result Value Ref Range Status   MRSA by PCR POSITIVE (A) NEGATIVE Final    Comment:        The GeneXpert MRSA Assay (FDA approved for NASAL specimens only), is one component of a comprehensive MRSA colonization surveillance program. It is not intended to diagnose MRSA infection nor to guide or monitor treatment for MRSA infections. RESULT CALLED TO, READ BACK BY AND VERIFIED WITH: B.KAUR RN 1042 [id number] A.QUIZON   Culture, blood (Routine X 2) w Reflex to ID Panel     Status: None   Collection Time: 03/26/17 10:14 AM  Result Value Ref Range Status   Specimen Description BLOOD RIGHT ANTECUBITAL  Final   Special Requests   Final    BOTTLES DRAWN AEROBIC AND ANAEROBIC Blood Culture adequate volume   Culture   Final    NO GROWTH 5 DAYS Performed at [facility], [address] [place], [place] [address]    Report Status 03/19/2017 FINAL  Final  Culture, blood (Routine X 2) w Reflex to ID Panel     Status: None   Collection Time: 03/26/17 10:14 AM  Result Value Ref Range Status   Specimen Description BLOOD LEFT ANTECUBITAL  Final   Special Requests   Final    BOTTLES DRAWN AEROBIC AND ANAEROBIC Blood Culture adequate volume   Culture   Final    NO GROWTH  5 DAYS Performed at [facility], [address]  [place], [place] [address]    Report Status 03/07/2017 FINAL  Final  Body fluid culture     Status: None   Collection Time: 03/26/17  1:04 PM  Result Value Ref Range Status   Specimen Description ANKLE SYNOVIAL  Final   Special Requests NONE  Final   Gram Stain   Final    NO ORGANISMS SEEN WBC PRESENT, PREDOMINANTLY PMN Gram Stain Report Called to,Read Back By and Verified With: D.BLOCK RN AT 1412 ON 03/26/16 BY S.VANHOORNE    Culture   Final    RARE METHICILLIN RESISTANT STAPHYLOCOCCUS AUREUS RESULT CALLED TO, READ BACK BY AND VERIFIED WITH: D BLOCK RN AT 1335 ON [id number] BY SJW Performed at [facility], [address] [place], [place] [address]    Report Status 03/29/2017 FINAL  Final   Organism ID, Bacteria METHICILLIN RESISTANT STAPHYLOCOCCUS AUREUS  Final      Susceptibility   Methicillin resistant staphylococcus aureus - MIC*    CIPROFLOXACIN <=0.5 SENSITIVE Sensitive     ERYTHROMYCIN >=8 RESISTANT Resistant     GENTAMICIN <=0.5 SENSITIVE Sensitive     OXACILLIN >=4 RESISTANT Resistant     TETRACYCLINE <=1 SENSITIVE Sensitive     VANCOMYCIN 1 SENSITIVE Sensitive     TRIMETH/SULFA <=10 SENSITIVE Sensitive     CLINDAMYCIN <=0.25 SENSITIVE Sensitive     RIFAMPIN <=0.5 SENSITIVE Sensitive     Inducible Clindamycin NEGATIVE Sensitive     * RARE METHICILLIN RESISTANT STAPHYLOCOCCUS AUREUS  Anaerobic culture     Status: None   Collection Time: 03/26/17  1:04 PM  Result Value Ref Range Status   Specimen Description ANKLE SYNOVIAL  Final   Special Requests NONE  Final   Culture   Final    NO ANAEROBES ISOLATED Performed at [facility], [address], [place], [place] [address]    Report Status 03/30/2017 FINAL  Final  Anaerobic culture     Status: None (Preliminary result)   Collection Time: 17-Apr-2017  3:53 PM  Result Value Ref Range Status   Specimen Description WOUND LEFT ANKLE  Final   Special Requests NONE  Final   Culture   Final    NO ANAEROBES ISOLATED;  CULTURE IN PROGRESS FOR 5 DAYS   Report Status PENDING  Incomplete  Aerobic Culture (superficial specimen)     Status: None   Collection Time: 04-17-2017  3:53 PM  Result Value Ref Range Status   Specimen Description WOUND LEFT ANKLE  Final   Special Requests NONE  Final   Gram Stain   Final    RARE WBC PRESENT, PREDOMINANTLY PMN NO ORGANISMS SEEN Performed at [facility], [address], [place], [place] [address]    Culture RARE METHICILLIN RESISTANT STAPHYLOCOCCUS AUREUS  Final   Report Status 03/14/2017 FINAL  Final   Organism ID, Bacteria METHICILLIN RESISTANT STAPHYLOCOCCUS AUREUS  Final      Susceptibility   Methicillin resistant staphylococcus aureus - MIC*    CIPROFLOXACIN <=0.5 SENSITIVE Sensitive     ERYTHROMYCIN >=8 RESISTANT Resistant     GENTAMICIN <=0.5 SENSITIVE Sensitive     OXACILLIN >=4 RESISTANT Resistant     TETRACYCLINE <=1 SENSITIVE Sensitive     VANCOMYCIN 1 SENSITIVE Sensitive     TRIMETH/SULFA <=10 SENSITIVE Sensitive     CLINDAMYCIN <=0.25 SENSITIVE Sensitive     RIFAMPIN <=0.5 SENSITIVE Sensitive     Inducible Clindamycin  NEGATIVE Sensitive     * RARE METHICILLIN RESISTANT STAPHYLOCOCCUS AUREUS  Culture, Urine     Status: None   Collection Time: 03/04/2017  9:40 AM  Result Value Ref Range Status   Specimen Description URINE, CLEAN CATCH  Final   Special Requests NONE  Final   Culture   Final    NO GROWTH Performed at [facility], [address] [place], [place] [address]    Report Status 04/01/2017 FINAL  Final  Aerobic/Anaerobic Culture (surgical/deep wound)     Status: None (Preliminary result)   Collection Time: 03/12/2017  4:06 PM  Result Value Ref Range Status   Specimen Description CHEST RIGHT  Final   Special Requests Normal  Final   Gram Stain   Final    MODERATE WBC PRESENT, PREDOMINANTLY PMN NO ORGANISMS SEEN    Culture   Final    TOO YOUNG TO READ Performed at [facility], [address], [place], [place]  [address]    Report Status PENDING  Incomplete  Culture, body fluid-bottle     Status: None (Preliminary result)   Collection Time: 03/08/2017  4:57 PM  Result Value Ref Range Status   Specimen Description FLUID PERITONEAL  Final   Special Requests BOTTLES DRAWN AEROBIC AND ANAEROBIC  Final   Culture   Final    NO GROWTH < 12 HOURS Performed at [facility], [address] [place], [place] [address]    Report Status PENDING  Incomplete  Gram stain     Status: None   Collection Time: 03/08/2017  4:57 PM  Result Value Ref Range Status   Specimen Description FLUID PERITONEAL  Final   Special Requests NONE  Final   Gram Stain   Final    RARE WBC PRESENT, PREDOMINANTLY PMN NO ORGANISMS SEEN Performed at [facility], [address] [place], [place] [address]    Report Status 04/01/2017 FINAL  Final    RADIOLOGY STUDIES/RESULTS: Dg Chest 2 View  Result Date: 2017-03-27 CLINICAL DATA:  Confusion. EXAM: CHEST  2 VIEW COMPARISON:  03/10/2017 FINDINGS: The cardiac silhouette, mediastinal and hilar contours are within normal limits and stable. Stable tortuosity of the thoracic aorta. The lungs demonstrate mild chronic bronchitic changes but no infiltrates, edema or effusions. IMPRESSION: No acute cardiopulmonary findings. Electronically Signed   By: Rudie Meyer M.D.   On: 2017/03/27 15:59   Dg Ankle Complete Left  Result Date: 03/23/2017 CLINICAL DATA:  67 year old male with chronic left ankle pain EXAM: LEFT ANKLE COMPLETE - 3+ VIEW COMPARISON:  Recent prior radiographs of the left ankle March 27, 2017 FINDINGS: Diffuse soft tissue swelling about the ankle. Probable small ankle joint effusion. No significant interval change in the 2 days since the prior radiograph. No destructive bony changes. Mild tibiotalar and fibulotalar degenerative changes. IMPRESSION: 1. Persistent diffuse soft tissue swelling and small ankle joint effusion without evidence of fracture or malalignment. 2. Mild  degenerative osteoarthritis again noted. No progressive or destructive bony changes at this time. Electronically Signed   By: Malachy Moan M.D.   On: 03/23/2017 19:05   Dg Ankle Complete Left  Result Date: 03-27-17 CLINICAL DATA:  Swelling, pain and stiffness. EXAM: LEFT ANKLE COMPLETE - 3+ VIEW COMPARISON:  None. FINDINGS: There is no fracture or dislocation. Slight degenerative changes at the ankle joint with a small ankle effusion and soft tissue swelling around the ankle. IMPRESSION: 1. Slight arthritic changes at the ankle joint with a small  joint effusion and soft tissue swelling. 2. No fractures. Electronically Signed   By: Francene Boyers M.D.   On: 03/11/2017 12:59   Ct Chest Wo Contrast  Result Date: 03/27/2017 CLINICAL DATA:  Hemoptysis, abdominal pain, confusion, cirrhosis secondary to hepatitis C, diabetes mellitus, leukocytosis, UTI, MRSA bacteremia EXAM: CT CHEST WITHOUT CONTRAST TECHNIQUE: Multidetector CT imaging of the chest was performed following the standard protocol without IV contrast. Sagittal and coronal MPR images reconstructed from axial data set. COMPARISON:  CT abdomen and pelvis 03/10/2017 FINDINGS: Cardiovascular: Aneurysmal dilatation of the ascending thoracic aorta 4.3 cm image 53. Scattered atherosclerotic calcifications aorta and coronary arteries. No pericardial effusion. Mediastinum/Nodes: Esophagus unremarkable. Scattered normal size mediastinal lymph nodes without thoracic adenopathy. Base of cervical region unremarkable. Lungs/Pleura: Dependent atelectasis RIGHT lower lobe. Area of opacity at the lateral aspect of the RIGHT upper lobe anteriorly, 4.8 x 4.5 x 3.0 cm. Minimal surrounding infiltrate. This process extends into the anterolateral RIGHT chest wall with destruction of the RIGHT third rib company by pathologic fracture. Additionally, the RIGHT upper lobe process is contiguous through the chest wall with a subpectoral gas and fluid collection external to  the ribs in the anterolateral upper RIGHT chest, 10.3 x 3.2 cm in greatest axial dimensions and extending for 5 cm length. Bronchiectasis and atelectasis at anterior base of RIGHT middle lobe. Remaining lungs clear. Upper Abdomen: Cirrhotic liver with multiple perigastric and perisplenic varices in the upper abdomen with a spontaneous splenorenal shunt better demonstrated on CT abdomen. Moderate ascites. Large area of developing low attenuation within the central spleen question splenic infarct versus developing splenic abscess new since 03/11/2017. Distended gallbladder. Musculoskeletal: No additional osseous findings. IMPRESSION: Consolidation at the anterolateral RIGHT upper lobe measuring 4.8 x 4.5 x 3.0 cm in size, contiguous with infiltrative process through the anterolateral RIGHT chest wall extending into the extra osseous soft tissues deep to the RIGHT pectoralis muscle where a gas and fluid collection is seen measuring 10.3 x 3.2 x 5.0 cm. Findings may either represent RIGHT upper lobe pneumonia with extension of infection through the chest wall associated with osteomyelitis of the anterolateral RIGHT third rib and accompanying chest wall abscess or RIGHT upper lobe tumor extending involving the RIGHT upper lobe and chest wall. The lack of RIGHT upper lobe opacity or RIGHT third rib destruction on chest radiograph at presentation on 03/10/2017 makes infection (pneumonia with chest wall extension and subpectoral abscess) a more likely etiology than tumor. Cirrhotic liver with numerous varices in the upper abdomen and a new area of low attenuation within the central spleen which could represent an infarct or developing infection/abscess. Electronically Signed   By: Ulyses Southward M.D.   On: 03/27/2017 16:09   Ct Abdomen Pelvis W Contrast  Result Date: 03/14/2017 CLINICAL DATA:  67 year old male with history of abdominal pain, confusion and hip pain. Jaundice. EXAM: CT ABDOMEN AND PELVIS WITH CONTRAST  TECHNIQUE: Multidetector CT imaging of the abdomen and pelvis was performed using the standard protocol following bolus administration of intravenous contrast. CONTRAST:  ISOVUE-300 IOPAMIDOL (ISOVUE-300) INJECTION 61% COMPARISON:  No priors. FINDINGS: Lower chest: Areas of mild cylindrical bronchiectasis are noted in the lung bases, most evident in the right middle lobe where there is also extensive thickening of the peribronchovascular interstitium and associated peribronchovascular micro and macronodularity as well as regional architectural distortion, most compatible with areas of chronic mucoid impaction in the setting of chronic post infectious or inflammatory scarring. Hepatobiliary: Liver has a shrunken appearance and nodular contour,  compatible with advanced cirrhosis. Contrast bolus is suboptimal on today's examination, and with this limitation in mind there is no discrete hypervascular lesion confidently identified. No intra or extrahepatic biliary ductal dilatation. Gallbladder is normal in appearance. Pancreas: No pancreatic mass. No pancreatic ductal dilatation. No pancreatic or peripancreatic fluid or inflammatory changes. Spleen: Spleen is enlarged measuring 18.0 x 8.4 x 17.1 cm (estimated splenic volume of 1,293 mL) . Adrenals/Urinary Tract: There are multiple nonobstructive calculi throughout the collecting systems of both kidneys measuring up to 4 mm in the interpolar collecting system of the right kidney. No additional calculi are noted along the course of either ureter or within the lumen of the urinary bladder. No hydroureteronephrosis. Mild multifocal cortical thinning in the right kidney. 5.6 cm simple cyst in the interpolar region of the left kidney. No suspicious renal lesions. Urinary bladder is normal in appearance. Bilateral adrenal glands are normal in appearance. Stomach/Bowel: Normal appearance of the stomach. No pathologic dilatation of small bowel or colon. Numerous colonic  diverticulae are noted, without surrounding inflammatory changes to suggest an acute diverticulitis at this time. Normal appendix. Vascular/Lymphatic: Aortic atherosclerosis, without evidence of aneurysm or dissection in the abdominal or pelvic vasculature. Numerous portosystemic collateral pathways, most evident for large splenorenal collaterals and small proximal gastric varices. Portal vein remains patent at this time and is dilated measuring up to 18 mm in diameter in the porta hepatis. No lymphadenopathy. Reproductive: Prostate gland and seminal vesicles are unremarkable in appearance. Other: Small volume of ascites.  No pneumoperitoneum. Musculoskeletal: There are no aggressive appearing lytic or blastic lesions noted in the visualized portions of the skeleton. IMPRESSION: 1. Morphologic changes of advanced cirrhosis with evidence of portal hypertension, including dilated portal vein and splenomegaly with numerous portosystemic collateral pathways. Previously identified lesion in segment 8 is not confidently noted on today's examination. 2. Numerous nonobstructive calculi within the collecting systems of both kidneys. No ureteral stones or findings of urinary tract obstruction are noted at this time. 3. Aortic atherosclerosis. 4. Colonic diverticulosis without evidence of acute diverticulitis at this time. Electronically Signed   By: Trudie Reed M.D.   On: 03/08/2017 14:08   US Renal  Result Date: 03/30/2017 CLINICAL DATA:  Acute renal failure EXAM: RENAL / URINARY TRACT ULTRASOUND COMPLETE COMPARISON:  04/02/2017. FINDINGS: Right Kidney: Length: 11.3 cm. Scattered nonobstructing renal calculi are noted similar to that seen on prior CT examination. Left Kidney: Length: 11.3 cm. Scattered nonobstructing renal stones are noted. Known cystic lesion seen on recent CT is not as well appreciated on today's exam. Bladder: Not well distended. IMPRESSION: Bilateral nonobstructing renal stones similar to that  seen on prior CT. Previously noted left renal cyst is not well appreciated on this exam. Electronically Signed   By: Alcide Clever M.D.   On: 03/30/2017 10:30   Mr Ankle Left Wo Contrast  Result Date: 03/24/2017 CLINICAL DATA:  Ankle swelling.  Poor historian. EXAM: MRI OF THE LEFT ANKLE WITHOUT CONTRAST TECHNIQUE: Multiplanar, multisequence MR imaging of the ankle was performed. No intravenous contrast was administered. COMPARISON:  None. FINDINGS: Patient refused to complete the exam per technologist report. Contrast-enhanced images with unable to be acquired. TENDONS Peroneal: Intact peroneus longus and peroneus brevis tendons. Posteromedial: Intact tibialis posterior, flexor hallucis longus and flexor digitorum longus tendons. Mild-to-moderate amount of fluid along the flexor digitorum longus tendons consistent with tenosynovitis. Anterior: Intact tibialis anterior, extensor hallucis longus and extensor digitorum longus tendons. Achilles: Intact. Plantar Fascia: Intact. LIGAMENTS Lateral: Intact. Medial: Intact.  CARTILAGE Ankle Joint: Small joint effusion. Cartilaginous thinning of the ankle joint. Subtalar Joints/Sinus Tarsi: No joint effusion or chondral defect. Bones: Mild marrow edema of the medial and lateral malleoli. No joint dislocation or suspicious osseous lesions. Mild degenerative sub chondral cystic change and edema of the talar dome. Other: Moderate diffuse soft tissue swelling of the included leg, ankle and dorsum of the mid and included forefoot. IMPRESSION: 1. There is moderate diffuse soft tissue swelling of the included distal leg, ankle and dorsum of the mid and included forefoot. Findings are nonspecific but can be seen in third spacing of fluid, venous stasis or possibly cellulitis. No abscess collections. 2. Nonspecific mild marrow edema of the mediolateral malleoli possibly posttraumatic in etiology. Fractures are not conclusively identified. No joint dislocation is seen. 3. Mild ankle  joint osteoarthritis with small joint effusion and chondrosis. 4. Tenosynovitis of the flexor digitorum longus Electronically Signed   By: Tollie Eth M.D.   On: 03/24/2017 14:16   Mr Hip Right Wo Contrast  Result Date: 03/22/2017 CLINICAL DATA:  Bilateral hip pain. Recent fall. Age-indeterminate fracture of the right greater trochanter on radiographs. EXAM: MR OF THE RIGHT HIP WITHOUT CONTRAST TECHNIQUE: Multiplanar, multisequence MR imaging was performed. No intravenous contrast was administered. COMPARISON:  Pelvic CT 03/31/2017, hip radiographs 03/19/2017 and right hip CT 01/28/2015. FINDINGS: Despite efforts by the technologist and patient, mild motion artifact is present on today's exam and could not be eliminated. This reduces exam sensitivity and specificity. Bones: No evidence of acute fracture, dislocation or femoral head avascular necrosis. There is a chronic fracture of the right femoral greater trochanter which is posteriorly displaced without healing (nonunion). This was demonstrated as an acute injury on the right hip CT 01/28/2015. Lower lumbar spondylosis noted. The sacroiliac joints appear normal. Articular cartilage and labrum Articular cartilage:  Mild degenerative changes of both hips. Labrum:  No gross labral tear or paralabral cyst. Joint or bursal effusion Joint effusion:  No significant hip joint effusion. Bursae: No focal periarticular fluid collections are identified. The right hip periarticular soft tissues appear unremarkable. However, on the left, there is prominent soft tissue edema surrounding the hip, greatest posteriorly. This tracks along the gemellus and obturator internus muscles. No focal fluid collection identified. Muscles and tendons Muscles and tendons: As above, there is periarticular soft tissue edema surrounding the left hip, involving gemellus and obturator internus muscles. There is a component along the medial pelvic side wall. There is also some edema within the  gluteus musculature. The left common hamstring tendon demonstrates tendinosis and partial tearing. No significant right hip tendon abnormalities are seen. The right piriformis muscle is chronically atrophied. Other findings Miscellaneous:  Mild ascites noted. IMPRESSION: 1. 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 This fracture was demonstrated as an acute injury in 2016. 2. No acute osseous findings identified. No significant hip or sacroiliac joint effusion. 3. Soft tissue edema surrounding the left hip, likely posttraumatic or potentially inflammatory. No focal abscess. The left common hamstring tendon appears partially torn, and this could be the etiology. Electronically Signed   By: Carey Bullocks M.D.   On: 03/22/2017 11:53   Ir US Guide Bx Asp/drain  Result Date: 03/26/2017 INDICATION: 67 year old with left ankle swelling. Left ankle effusion identified on MRI. EXAM: ULTRASOUND-GUIDED ASPIRATION OF LEFT ANKLE JOINT EFFUSION MEDICATIONS: None ANESTHESIA/SEDATION: None COMPLICATIONS: None immediate. PROCEDURE: Patient is confused and unable to give consent. Consent was obtained for the patient's wife via the telephone. The left ankle  was evaluated with ultrasound. Ankle effusion was identified along the anterolateral aspect of the ankle. The skin was prepped with chlorhexidine. A sterile field was created. Skin was anesthetized with 1% lidocaine. Using ultrasound guidance, 18 gauge spinal needle was directed into the joint effusion. Initially 3 mL thick yellow fluid was aspirated. 2 mL of additional bloody fluid was removed. Fluid was sent for analysis. Bandage placed over the puncture site. FINDINGS: Effusion along the anterolateral aspect of the ankle joint. 5 mL of fluid was removed. IMPRESSION: Successful ultrasound-guided aspiration of the left ankle joint effusion. Electronically Signed   By: Richarda Overlie M.D.   On: 03/26/2017 14:51   Ct Biopsy  Result Date:  03/18/2017 INDICATION: Right anterior chest wall hematoma versus mass or abscess EXAM: CT-GUIDED BIOPSY RIGHT CHEST WALL ABNORMALITY MEDICATIONS: 1% lidocaine local ANESTHESIA/SEDATION: Moderate Sedation Time:  None. The patient was continuously monitored during the procedure by the interventional radiology nurse under my direct supervision. PROCEDURE: The procedure, risks, benefits, and alternatives were explained to the patient. Questions regarding the procedure were encouraged and answered. The patient understands and consents to the procedure. Previous imaging reviewed. Patient positioned supine. Noncontrast localization CT performed to demonstrate the right anterior chest wall mass/abnormality. Overlying skin marked for a medial approach. Under sterile conditions and local anesthesia, a 17 gauge coaxial guide needle was advanced from an anterior oblique approach into the chest wall abnormality. Needle position confirmed with CT. Syringe aspiration yielded dark bloody fluid. Attempts were made core biopsy. Sampling was scant and low yield. Of note, there is an adjacent acute right anterior rib fracture. Because of the poor sampling, chest wall hematoma is favored. Air within the soft tissue abnormality may be secondary to adjacent lung injury. Patient tolerated the procedure well without complication. Vital sign monitoring by nursing staff during the procedure will continue as patient is in the special procedures unit for post procedure observation. FINDINGS: The images document guide needle placement within the right chest wall abnormality. Post biopsy images demonstrate no significant interval change. COMPLICATIONS: None immediate. IMPRESSION: Successful CT-guided aspiration and attempted core biopsy of the right chest wall abnormality. Sample sent for pathology, cytology, and culture. Chest wall hematoma is favored. Electronically Signed   By: Judie Petit.  Shick M.D.   On: 03/24/2017 16:40   Dg Chest Port 1  View  Result Date: 03/27/2017 CLINICAL DATA:  Subcutaneous emphysema.  Hemoptysis. EXAM: PORTABLE CHEST 1 VIEW COMPARISON:  03/05/2017 FINDINGS: The cardiomediastinal silhouette is unchanged. Heart size is within normal limits. Thoracic aorta is tortuous. Chronic bronchitic changes are again noted. There is new hazy airspace opacity in the lateral right mid to upper lung. The left lung remains clear. No sizable pleural effusion or pneumothorax is identified. There is persistent mild elevation of the right hemidiaphragm. IMPRESSION: New right mid upper lung airspace opacity which may reflect pneumonia or hemorrhage. Electronically Signed   By: Sebastian Ache M.D.   On: 03/27/2017 12:15   Dg Hips Bilat W Or Wo Pelvis 3-4 Views  Result Date: 03/12/2017 CLINICAL DATA:  Suspected hip pain with decreased motility. EXAM: DG HIP (WITH OR WITHOUT PELVIS) 3-4V BILAT COMPARISON:  None. FINDINGS: There is an age-indeterminate avulsion fracture of the right greater trochanter. Otherwise, there is no evidence of fracture or dislocation. Minimal osteoarthritic changes of the left hip. IMPRESSION: Age-indeterminate avulsion fracture of the right greater trochanter. No evidence of left hip fracture. Electronically Signed   By: Ted Mcalpine M.D.   On: 03/19/2017 12:58  Ir Paracentesis  Result Date: 03/15/2017 INDICATION: Cirrhosis, ascites, abdominal distension EXAM: ULTRASOUND GUIDED PARACENTESIS MEDICATIONS: 1% lidocaine local COMPLICATIONS: None immediate. PROCEDURE: An ultrasound guided paracentesis was thoroughly discussed with the patient and questions answered. The benefits, risks, alternatives and complications were also discussed. The patient understands and wishes to proceed with the procedure. Written consent was obtained. Ultrasound was performed to localize and mark an adequate pocket of fluid in the right upper quadrant of the abdomen. The area was then prepped and draped in the normal sterile fashion.  1% Lidocaine was used for local anesthesia. Under ultrasound guidance a Safe-T-Centesis needle catheter was introduced. Paracentesis was performed. The catheter was removed and a dressing applied. FINDINGS: A total of approximately 2 L of clear ascitic fluid was removed. A fluid sample was sent for laboratory analysis. IMPRESSION: Successful ultrasound guided paracentesis yielding 2 L of ascites. Electronically Signed   By: Judie Petit.  Shick M.D.   On: 03/29/2017 16:52     LOS: 12 days   Jeoffrey Massed, MD  [facility] Pager:[phone]  If 7PM-7AM, please contact night-coverage [email or web address] Password [facility] 04/02/2017, 7:31 AM

## 2017-04-02 NOTE — Progress Notes (Signed)
[facility] Progress Note    Since last GI note:  Less responsive (now to pain and occasionally voice). New afib.  Objective: Vital signs in last 24 hours: Temp:  [97.6 F (36.4 C)-97.9 F (36.6 C)] 97.6 F (36.4 C) (01/30 0800) Pulse Rate:  [89-95] 91 (01/30 0600) Resp:  [11-19] 14 (01/30 0857) BP: (92-159)/(54-82) 92/72 (01/30 0800) SpO2:  [86 %-100 %] 86 % (01/30 0800) Last BM Date: 03/29/17 General: alert and oriented times zero Heart: regular rate and rythm Abdomen: soft, non-tender, non-distended, normal bowel sounds   Lab Results: Recent Labs    03/13/2017 0327 04/01/17 0338  WBC 26.4* 21.2*  HGB 12.4* 12.6*  PLT 159 122*  MCV 92.3 90.9   Recent Labs    03/19/2017 1725 04/01/17 0338 04/02/17 0303  NA 138 139 142  K 5.3* 4.5 4.7  CL 112* 111 109  CO2 18* 19* 23  GLUCOSE 213* 182* 116*  BUN 90* 94* 105*  CREATININE 2.96* 3.12* 3.21*  CALCIUM 7.7* 7.6* 7.3*   Recent Labs    03/30/2017 0327 03/19/2017 1725 04/01/17 0338 04/02/17 0303  PROT 6.4*  --  6.6 6.1*  ALBUMIN 1.4* 1.4* 1.3* 1.3*  AST 263*  --  200* 177*  ALT 88*  --  88* 80*  ALKPHOS 231*  --  209* 187*  BILITOT 14.0*  --  13.4* 14.3*  BILIDIR 7.1*  --   --   --   IBILI 6.9*  --   --   --    Recent Labs    03/23/2017 0327 04/01/17 0338  INR 1.92 1.92    Medications: Scheduled Meds: . aspirin EC  81 mg Oral Daily  . furosemide  40 mg Intravenous Q12H  . insulin aspart  0-15 Units Subcutaneous TID WC  . insulin aspart  0-5 Units Subcutaneous QHS  . insulin detemir  15 Units Subcutaneous QHS  . lactulose  300 mL Rectal Daily  . multivitamin with minerals  1 tablet Oral Daily  . pantoprazole (PROTONIX) IV  40 mg Intravenous Q12H  . phytonadione  10 mg Subcutaneous Daily  . propranolol  20 mg Oral BID  . rifaximin  550 mg Oral BID  . sodium bicarbonate  25 mEq Intravenous Once  . sodium chloride flush  3 mL Intravenous Q12H  . sodium polystyrene  45 g Rectal Once  .  tamsulosin  0.4 mg Oral Daily  . venlafaxine  150 mg Oral Q breakfast  . venlafaxine  75 mg Oral QHS   Continuous Infusions: . sodium chloride 250 mL (04/01/17 0000)  . ceFTAROline (TEFLARO) IV Stopped (04/01/17 2354)  . diltiazem (CARDIZEM) infusion 10 mg/hr (04/02/17 0856)  .  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  Kidneys appear to be doing the same.  I do not think he will survive this despite excellent supportive care in ICU.  I agree with changing to comfort measures and explained that to his wife and daughter in the room today.  Please call or page with any further questions or concerns.   Rachael Feeaniel P Jacobs, MD  04/02/2017, 10:14 AM [facility] Pager [phone]

## 2017-04-03 ENCOUNTER — Other Ambulatory Visit: Payer: Self-pay | Admitting: Radiology

## 2017-04-03 DIAGNOSIS — Z515 Encounter for palliative care: Secondary | ICD-10-CM

## 2017-04-03 LAB — RENAL FUNCTION PANEL
ALBUMIN: 1.2 g/dL — AB (ref 3.5–5.0)
ANION GAP: 14 (ref 5–15)
BUN: 125 mg/dL — AB (ref 6–20)
CO2: 22 mmol/L (ref 22–32)
Calcium: 7.1 mg/dL — ABNORMAL LOW (ref 8.9–10.3)
Chloride: 106 mmol/L (ref 101–111)
Creatinine, Ser: 4.07 mg/dL — ABNORMAL HIGH (ref 0.61–1.24)
GFR calc Af Amer: 16 mL/min — ABNORMAL LOW (ref >60)
GFR calc non Af Amer: 14 mL/min — ABNORMAL LOW (ref >60)
GLUCOSE: 98 mg/dL (ref 65–99)
POTASSIUM: 5.4 mmol/L — AB (ref 3.5–5.1)
Phosphorus: 9 mg/dL — ABNORMAL HIGH (ref 2.5–4.6)
Sodium: 142 mmol/L (ref 135–145)

## 2017-04-03 LAB — HEPATIC FUNCTION PANEL
ALK PHOS: 168 U/L — AB (ref 38–126)
ALT: 83 U/L — AB (ref 17–63)
AST: 211 U/L — AB (ref 15–41)
Albumin: 1.2 g/dL — ABNORMAL LOW (ref 3.5–5.0)
BILIRUBIN DIRECT: 8.1 mg/dL — AB (ref 0.1–0.5)
BILIRUBIN INDIRECT: 7.3 mg/dL — AB (ref 0.3–0.9)
BILIRUBIN TOTAL: 15.4 mg/dL — AB (ref 0.3–1.2)
Total Protein: 5.8 g/dL — ABNORMAL LOW (ref 6.5–8.1)

## 2017-04-03 LAB — CBC
HEMATOCRIT: 40.2 % (ref 39.0–52.0)
HEMOGLOBIN: 13.4 g/dL (ref 13.0–17.0)
MCH: 31.1 pg (ref 26.0–34.0)
MCHC: 33.3 g/dL (ref 30.0–36.0)
MCV: 93.3 fL (ref 78.0–100.0)
Platelets: UNDETERMINED 10*3/uL (ref 150–400)
RBC: 4.31 MIL/uL (ref 4.22–5.81)
RDW: 20.6 % — AB (ref 11.5–15.5)
WBC: 21.8 10*3/uL — ABNORMAL HIGH (ref 4.0–10.5)

## 2017-04-03 LAB — PROTIME-INR
INR: 1.96
Prothrombin Time: 22.2 seconds — ABNORMAL HIGH (ref 11.4–15.2)

## 2017-04-03 LAB — GLUCOSE, CAPILLARY: Glucose-Capillary: 68 mg/dL (ref 65–99)

## 2017-04-03 LAB — AMMONIA: Ammonia: 74 umol/L — ABNORMAL HIGH (ref 9–35)

## 2017-04-03 MED ORDER — POLYVINYL ALCOHOL 1.4 % OP SOLN: 1.0000 [drp] | Freq: Four times a day (QID) | OPHTHALMIC | Status: DC | PRN

## 2017-04-03 MED ORDER — LORAZEPAM 1 MG PO TABS: 1.0000 mg | ORAL_TABLET | ORAL | Status: DC | PRN

## 2017-04-03 MED ORDER — MORPHINE BOLUS VIA INFUSION
4.0000 mg | INTRAVENOUS | Status: DC | PRN
  Administered 2017-04-03 (×4): 4 mg via INTRAVENOUS
  Filled 2017-04-03: qty 4

## 2017-04-03 MED ORDER — GLYCOPYRROLATE 0.2 MG/ML IJ SOLN: 0.2000 mg | INTRAMUSCULAR | Status: DC | PRN

## 2017-04-03 MED ORDER — SODIUM CHLORIDE 0.9 % IV SOLN
2.0000 mg/h | INTRAVENOUS | Status: DC
  Administered 2017-04-03: 1 mg/h via INTRAVENOUS
  Filled 2017-04-03: qty 10

## 2017-04-03 MED ORDER — BIOTENE DRY MOUTH MT LIQD: 15.0000 mL | OROMUCOSAL | Status: DC | PRN

## 2017-04-03 MED ORDER — GLYCOPYRROLATE 1 MG PO TABS: 1.0000 mg | ORAL_TABLET | ORAL | Status: DC | PRN

## 2017-04-03 MED ORDER — LORAZEPAM 2 MG/ML IJ SOLN: 1.0000 mg | INTRAMUSCULAR | Status: DC | PRN

## 2017-04-03 MED ORDER — HALOPERIDOL 0.5 MG PO TABS: 0.5000 mg | ORAL_TABLET | ORAL | Status: DC | PRN

## 2017-04-03 MED ORDER — LIP MEDEX EX OINT
TOPICAL_OINTMENT | CUTANEOUS | Status: AC
  Administered 2017-04-03: 16:00:00
  Filled 2017-04-03: qty 7

## 2017-04-03 MED ORDER — SODIUM CHLORIDE 0.9% FLUSH: 3.0000 mL | INTRAVENOUS | Status: DC | PRN

## 2017-04-03 MED ORDER — SODIUM CHLORIDE 0.9% FLUSH: 3.0000 mL | Freq: Two times a day (BID) | INTRAVENOUS | Status: DC

## 2017-04-03 MED ORDER — LORAZEPAM 2 MG/ML PO CONC: 1.0000 mg | ORAL | Status: DC | PRN

## 2017-04-03 MED ORDER — HALOPERIDOL LACTATE 2 MG/ML PO CONC
0.5000 mg | ORAL | Status: DC | PRN
  Filled 2017-04-03: qty 0.3

## 2017-04-03 MED ORDER — LORAZEPAM 2 MG/ML IJ SOLN
1.0000 mg | INTRAMUSCULAR | Status: DC | PRN
  Administered 2017-04-03: 1 mg via INTRAVENOUS
  Filled 2017-04-03: qty 1

## 2017-04-03 MED ORDER — HALOPERIDOL LACTATE 5 MG/ML IJ SOLN: 0.5000 mg | INTRAMUSCULAR | Status: DC | PRN

## 2017-04-03 MED ORDER — HALOPERIDOL LACTATE 5 MG/ML IJ SOLN
2.0000 mg | Freq: Four times a day (QID) | INTRAMUSCULAR | Status: DC
  Administered 2017-04-03: 2 mg via INTRAVENOUS
  Filled 2017-04-03: qty 1

## 2017-04-03 MED ORDER — SODIUM CHLORIDE 0.9 % IV SOLN: 250.0000 mL | INTRAVENOUS | Status: DC | PRN

## 2017-04-03 MED ORDER — HYDROMORPHONE HCL 1 MG/ML IJ SOLN: 1.0000 mg | INTRAMUSCULAR | Status: DC | PRN

## 2017-04-04 NOTE — Progress Notes (Signed)
Witnessed waste of 230 mls of morphine in sink with Al

## 2017-04-04 NOTE — Progress Notes (Signed)
Examined patient and talked with wife at length.     Discussed transition to full comfort.  D/C monitoring, transfer to 3W, hang opioids at bedside for convenience, wean oxygen to room air when he is completely comfortable.  Wife requested information regarding prognosis - we discussed hours to days.  She understands he is actively dying.  Discussed with RN. Changed orders to full comfort and placed transfer order.  Norvel RichardsMarianne Aero Drummonds, PA-C Palliative Medicine Pager: [phone]   Total time 35 min.

## 2017-04-04 NOTE — Progress Notes (Signed)
230 mls of Morphine wasted in Sink at 2345.  Witness was Academic librarianDebbie RN

## 2017-04-04 NOTE — Progress Notes (Signed)
PROGRESS NOTE        PATIENT DETAILS Name: Kurt Reid Age: 67 y.o. Sex: male Date of Birth: [birth year] Admit Date: 03/10/2017 Admitting Physician Courage Mariea Clonts, MD ZOX:[id number], Len Blalock, MD  Brief Narrative: Patient is a 67 y.o. male with history of cirrhosis secondary to hepatitis C/portal hypertension/esophageal varices-admitted with confusion-further evaluation revealed disseminated MRSA infection.  Subsequent hospital course complicated by worsening encephalopathy, acute kidney injury, ongoing failure to thrive syndrome, A. fib with RVR.  Prognosis felt to be very poor-after extensive discussion with family-he has now been transitioned to comfort care.   See below for further details  Subjective: Not arousable-has occasional myoclonic jerks.  Response-he has not eaten in days.  Assessment/Plan: Sepsis secondary to disseminated MRSA infection with left ankle septic arthritis: Unfortunately in spite of appropriate antimicrobial therapy patient continued to worsen.  Transthoracic echocardiogram negative for vegetation, not felt to be a good candidate for TEE given tenuous medical condition.  Underwent left ankle aspiration by IR with cultures positive for MRSA, subsequently underwent irrigation and debridement by orthopedics on 1/25.  Last set of blood cultures on 1/23.  Unfortunately he developed acute kidney injury, vancomycin was discontinued the patient was started on Teflaro.  Given continued deterioration in spite of aggressive gas/IV antibiotics-after extensive discussion with family by numerous MDs-he has now been transitioned to full comfort measures.  He no longer is on any antimicrobial therapy.   Right upper lobe mass infiltrating through the right chest wall into the extraosseous tissues: Suspected related to disseminated MRSA infection, underwent a CT-guided aspiration by interventional radiology on 1/28-cultures continue to be negative-cytology did not  show any malignant cells-but did show inflammatory cells.  Doubt further workup required-he is deteriorating-and has been transitioned to comfort measures  Acute kidney injury: Thought to be either due to vancomycin toxicity or hemodynamically mediated due to disseminated MRSA infection.  He continues to have worsening renal function.  Given overall poor prognosis-felt not a candidate for aggressive care including renal replacement therapy.  Seen by nephrology during his hospital stay  Decompensated liver cirrhosis: Underwent 2 L paracentesis on 1/28-abdomen is distended but not tense-was maintained on Lasix-but this has now been discontinued at this time.  Acute metabolic encephalopathy: Felt to be multifactorial-due to disseminated MRSA infection, chronic hepatic encephalopathy.  Ammonia levels were not that significantly elevated.  He continues to be very somnolent-and essentially unresponsive today.  Has been transitioned to full comfort measures. .  A. fib with RVR: Occurred on 1/30-started Cardizem infusion-thankfully has converted back to sinus rhythm.  Have briefly discussed case with cardiology-given decompensated liver cirrhosis and worsening renal function and really no alternative to Cardizem or beta-blocker.  Not a long-term candidate for anticoagulation.  In any event he has been transitioned to full comfort measures.    Insulin-dependent DM-2: CBGs were stable with SSI and Levemir-since he has been transitioned to comfort measures-all insulin therapy has been discontinued.    Hemoptysis: Secondary to disseminated MRSA infection-with septic emboli to the lungs-no further workup is required.  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  Ethics/palliative care: Unfortunate 67 year old with liver cirrhosis-admitted with sepsis secondary to disseminated MRSA  infection-hospital course complicated by encephalopathy, acute kidney injury, A. fib with RVR.  He continues to worsen in spite of  aggressive therapy-and prolonged hospital stay.  He appears in very poor shape-his prognosis is very grim.  After extensive discussion with family-he was made a DNR on 1/30-and has been transitioned to comfort measures today.  Appreciate palliative care follow-up/input.  Agreed that his life expectancy is a matter of a few hours/days-he may most likely pass while he is inpatient.Marland Kitchen  DVT Prophylaxis: SCD's  Code Status: DNR  Family Communication: Spouse at bedside  Disposition Plan: Remain inpatient-suspect inpatient death  Antimicrobial agents: Anti-infectives (From admission, onward)   Start     Dose/Rate Route Frequency Ordered Stop   03/30/17 2200  ceftaroline (TEFLARO) 400 mg in sodium chloride 0.9 % 250 mL IVPB  Status:  Discontinued    Comments:  Pharmacy please review and ADJUST DOSE   400 mg 250 mL/hr over 60 Minutes Intravenous Every 12 hours 03/30/17 1607 03/10/2017 0845   03/29/17 2200  vancomycin (VANCOCIN) 1,250 mg in sodium chloride 0.9 % 250 mL IVPB  Status:  Discontinued     1,250 mg 166.7 mL/hr over 90 Minutes Intravenous Every 24 hours 03/29/17 1341 03/29/17 1746   03/06/2017 2200  vancomycin (VANCOCIN) 1,750 mg in sodium chloride 0.9 % 500 mL IVPB  Status:  Discontinued     1,750 mg 250 mL/hr over 120 Minutes Intravenous Every 24 hours 03/06/2017 0856 03/29/17 1341   03/11/2017 1603  vancomycin (VANCOCIN) powder  Status:  Discontinued       As needed 03/07/2017 1603 03/11/2017 1626   03/27/17 2200  rifaximin (XIFAXAN) tablet 550 mg  Status:  Discontinued     550 mg Oral 2 times daily 03/27/17 1923 03/31/2017 0845   03/26/17 0800  vancomycin (VANCOCIN) 1,500 mg in sodium chloride 0.9 % 500 mL IVPB  Status:  Discontinued     1,500 mg 250 mL/hr over 120 Minutes Intravenous Every 24 hours 03/26/2017 1102 03/05/2017 0856   03/22/17 0600  vancomycin (VANCOCIN)  1,750 mg in sodium chloride 0.9 % 500 mL IVPB  Status:  Discontinued     1,750 mg 250 mL/hr over 120 Minutes Intravenous Every 24 hours 03/22/17 0547 03/07/2017 1102   03/20/2017 2000  cefTRIAXone (ROCEPHIN) 2 g in dextrose 5 % 50 mL IVPB  Status:  Discontinued     2 g 100 mL/hr over 30 Minutes Intravenous Every 24 hours 03/13/2017 1743 03/24/17 1421      Procedures:  Ultrasound-guided aspiration left ankle joint effusion Per Dr. Lowella Dandy interventional radiology 03/26/2017  Irrigation and debridement of left ankle per Dr. August Saucer 03/25/2017  CT-guided right chest wall aspiration and biopsy attempt per Dr. Denny Levy 2017-04-08  Ultrasound-guided paracentesis 04/08/17--2 L ascitic fluid removed per Dr. Denny Levy   CONSULTS:   Infectious disease: Dr. Drue Second 03/22/2017  Orthopedics Dr. August Saucer 03/23/2017  Psychiatry: Dr. Sharma Covert 03/07/2017  Pulmonary : Dr. Vassie Loll 03/31/2017  Gastroenterology: Dr. Russella Dar 03/27/2017  General surgery: Dr. Johna Sheriff 03/27/2017  Cardiothoracic surgery: Dr. Zenaida Niece trigt 03/29/2017  Nephrology Dr. Eliott Nine 04/08/17  Palliative care: Yong Channel, NP 2017/04/08     Time spent: 25  minutes-Greater than 50% of this time was spent in counseling, explanation of diagnosis, planning of further management, and coordination of care.  MEDICATIONS: Scheduled Meds: . furosemide  40 mg Intravenous Q12H  . haloperidol lactate  2 mg Intravenous Q6H   Continuous Infusions: . morphine 1 mg/hr (03/21/2017 1011)   PRN Meds:.albuterol, antiseptic oral rinse, glycopyrrolate **OR** glycopyrrolate **OR** glycopyrrolate, guaiFENesin-dextromethorphan, haloperidol **OR** haloperidol **OR** haloperidol lactate, HYDROmorphone (DILAUDID) injection, LORazepam **OR** LORazepam **OR** LORazepam, metoCLOPramide **OR**  metoCLOPramide (REGLAN) injection, morphine, ondansetron **OR** ondansetron (ZOFRAN) IV, opium-belladonna, polyvinyl alcohol   PHYSICAL EXAM: Vital signs: Vitals:   03/14/2017 0500 04/02/2017  0600 03/12/2017 0700 03/30/2017 0800  BP: (!) 99/51 (!) 99/50 (!) 110/46 (!) 81/45  Pulse: 81 80 80 79  Resp: 11 11 12 12   Temp:      TempSrc:      SpO2: 98% 97% 99% 98%  Weight:      Height:       Filed Weights   03/30/17 1422 04/01/17 0834 03/06/2017 0444  Weight: 108.8 kg (239 lb 13.8 oz) 105.4 kg (232 lb 5.8 oz) 108.3 kg (238 lb 12.1 oz)   Body mass index is 29.84 kg/m.   General appearance : Unresponsive today.  Chronic venous sicca.-Appears very frail. Eyes: Sclera is icteric Neck: Supple no JVD Resp: Good air entry bilaterally-no added sounds heard anteriorly. CVS: S1-S2 regular-but tachycardic. GI: Soft-some distention but not tense. Extremities: Legs are warm to touch-has bilateral lower extremity 1+edema Neurology: Occasional myoclonic jerks musculoskeletal: No digital cyanosis  I have personally reviewed following labs and imaging studies  LABORATORY DATA: CBC: Recent Labs  Lab 03/13/2017 0600 03/29/17 0632 03/30/17 0552 2017/04/28 0327 04/01/17 0338 03/25/2017 0400  WBC 29.1* 31.2* 26.2* 26.4* 21.2* 21.8*  NEUTROABS 25.9* 27.1* 21.7* 22.4* 18.7*  --   HGB 12.5* 12.7* 12.4* 12.4* 12.6* 13.4  HCT 37.2* 37.5* 36.5* 36.9* 36.8* 40.2  MCV 90.7 90.8 91.5 92.3 90.9 93.3  PLT 160 217 182 159 122* PLATELET CLUMPS NOTED ON SMEAR, UNABLE TO ESTIMATE    Basic Metabolic Panel: Recent Labs  Lab 04-28-2017 0327 Apr 28, 2017 1725 04/01/17 0338 04/02/17 0303 03/09/2017 0400  NA 136 138 139 142 142  K 6.3* 5.3* 4.5 4.7 5.4*  CL 113* 112* 111 109 106  CO2 16* 18* 19* 23 22  GLUCOSE 233* 213* 182* 116* 98  BUN 89* 90* 94* 105* 125*  CREATININE 2.58* 2.96* 3.12* 3.21* 4.07*  CALCIUM 7.7* 7.7* 7.6* 7.3* 7.1*  MG 2.5*  --   --   --   --   PHOS  --  5.5*  --   --  9.0*    GFR: Estimated Creatinine Clearance: 23.4 mL/min (A) (by C-G formula based on SCr of 4.07 mg/dL (H)).  Liver Function Tests: Recent Labs  Lab 03/30/17 0552 2017/04/28 0327 04-28-2017 1725 04/01/17 0338  04/02/17 0303 03/04/2017 0400  AST 152* 263*  --  200* 177* 211*  ALT 60 88*  --  88* 80* 83*  ALKPHOS 252* 231*  --  209* 187* 168*  BILITOT 11.7* 14.0*  --  13.4* 14.3* 15.4*  PROT 6.8 6.4*  --  6.6 6.1* 5.8*  ALBUMIN 1.6* 1.4* 1.4* 1.3* 1.3* 1.2*  1.2*   No results for input(s): LIPASE, AMYLASE in the last 168 hours. Recent Labs  Lab 03/30/17 1631 04/02/17 0906 03/24/2017 0400  AMMONIA 82* 68* 74*    Coagulation Profile: Recent Labs  Lab 03/30/17 0924 2017/04/28 0327 04/01/17 0338 04/02/2017 0400  INR 1.71 1.92 1.92 1.96    Cardiac Enzymes: No results for input(s): CKTOTAL, CKMB, CKMBINDEX, TROPONINI in the last 168 hours.  BNP (last 3 results) No results for input(s): PROBNP in the last 8760 hours.  HbA1C: No results for input(s): HGBA1C in the last 72 hours.  CBG: Recent Labs  Lab 04/01/17 2135 04/02/17 0729 04/02/17 1736 04/02/17 2114 03/27/2017 0739  GLUCAP 127* 98 85 88 68    Lipid Profile: No results for input(s):  CHOL, HDL, LDLCALC, TRIG, CHOLHDL, LDLDIRECT in the last 72 hours.  Thyroid Function Tests: No results for input(s): TSH, T4TOTAL, FREET4, T3FREE, THYROIDAB in the last 72 hours.  Anemia Panel: No results for input(s): VITAMINB12, FOLATE, FERRITIN, TIBC, IRON, RETICCTPCT in the last 72 hours.  Urine analysis:    Component Value Date/Time   COLORURINE AMBER (A) 03/10/2017 0940   APPEARANCEUR HAZY (A) 03/27/2017 0940   LABSPEC 1.010 03/16/2017 0940   PHURINE 5.0 03/11/2017 0940   GLUCOSEU NEGATIVE 03/29/2017 0940   GLUCOSEU 500 (A) 10/27/2014 1429   HGBUR MODERATE (A) 03/17/2017 0940   BILIRUBINUR NEGATIVE 03/05/2017 0940   KETONESUR NEGATIVE 03/19/2017 0940   PROTEINUR NEGATIVE 03/28/2017 0940   UROBILINOGEN 1.0 10/27/2014 1429   NITRITE POSITIVE (A) 04/01/2017 0940   LEUKOCYTESUR TRACE (A) 03/19/2017 0940    Sepsis Labs: Lactic Acid, Venous    Component Value Date/Time   LATICACIDVEN 2.74 (HH) 03/24/2017 1503     MICROBIOLOGY: Recent Results (from the past 240 hour(s))  MRSA PCR Screening     Status: Abnormal   Collection Time: Apr 01, 2017  8:10 AM  Result Value Ref Range Status   MRSA by PCR POSITIVE (A) NEGATIVE Final    Comment:        The GeneXpert MRSA Assay (FDA approved for NASAL specimens only), is one component of a comprehensive MRSA colonization surveillance program. It is not intended to diagnose MRSA infection nor to guide or monitor treatment for MRSA infections. RESULT CALLED TO, READ BACK BY AND VERIFIED WITH: B.KAUR RN 1042 [id number] A.QUIZON   Culture, blood (Routine X 2) w Reflex to ID Panel     Status: None   Collection Time: 03/26/17 10:14 AM  Result Value Ref Range Status   Specimen Description BLOOD RIGHT ANTECUBITAL  Final   Special Requests   Final    BOTTLES DRAWN AEROBIC AND ANAEROBIC Blood Culture adequate volume   Culture   Final    NO GROWTH 5 DAYS Performed at [facility], [address], [place], [place] [address]    Report Status 03/25/2017 FINAL  Final  Culture, blood (Routine X 2) w Reflex to ID Panel     Status: None   Collection Time: 03/26/17 10:14 AM  Result Value Ref Range Status   Specimen Description BLOOD LEFT ANTECUBITAL  Final   Special Requests   Final    BOTTLES DRAWN AEROBIC AND ANAEROBIC Blood Culture adequate volume   Culture   Final    NO GROWTH 5 DAYS Performed at [facility], [address], [place], [place] [address]    Report Status 03/10/2017 FINAL  Final  Body fluid culture     Status: None   Collection Time: 03/26/17  1:04 PM  Result Value Ref Range Status   Specimen Description ANKLE SYNOVIAL  Final   Special Requests NONE  Final   Gram Stain   Final    NO ORGANISMS SEEN WBC PRESENT, PREDOMINANTLY PMN Gram Stain Report Called to,Read Back By and Verified With: D.BLOCK RN AT 1412 ON 03/26/16 BY S.VANHOORNE    Culture   Final    RARE METHICILLIN RESISTANT STAPHYLOCOCCUS AUREUS RESULT CALLED TO, READ BACK  BY AND VERIFIED WITH: D BLOCK RN AT 1335 ON [id number] BY SJW Performed at [facility], [address] [place], [place] [address]    Report Status 03/29/2017 FINAL  Final   Organism ID, Bacteria METHICILLIN RESISTANT STAPHYLOCOCCUS AUREUS  Final  Susceptibility   Methicillin resistant staphylococcus aureus - MIC*    CIPROFLOXACIN <=0.5 SENSITIVE Sensitive     ERYTHROMYCIN >=8 RESISTANT Resistant     GENTAMICIN <=0.5 SENSITIVE Sensitive     OXACILLIN >=4 RESISTANT Resistant     TETRACYCLINE <=1 SENSITIVE Sensitive     VANCOMYCIN 1 SENSITIVE Sensitive     TRIMETH/SULFA <=10 SENSITIVE Sensitive     CLINDAMYCIN <=0.25 SENSITIVE Sensitive     RIFAMPIN <=0.5 SENSITIVE Sensitive     Inducible Clindamycin NEGATIVE Sensitive     * RARE METHICILLIN RESISTANT STAPHYLOCOCCUS AUREUS  Anaerobic culture     Status: None   Collection Time: 03/26/17  1:04 PM  Result Value Ref Range Status   Specimen Description ANKLE SYNOVIAL  Final   Special Requests NONE  Final   Culture   Final    NO ANAEROBES ISOLATED Performed at [facility], [address] [place], [place] [address]    Report Status 04/02/2017 FINAL  Final  Anaerobic culture     Status: None   Collection Time: 03/11/2017  3:53 PM  Result Value Ref Range Status   Specimen Description WOUND LEFT ANKLE  Final   Special Requests NONE  Final   Culture   Final    NO ANAEROBES ISOLATED Performed at [facility], [address], [place], [place] [address]    Report Status 04/02/2017 FINAL  Final  Aerobic Culture (superficial specimen)     Status: None   Collection Time: 03/29/2017  3:53 PM  Result Value Ref Range Status   Specimen Description WOUND LEFT ANKLE  Final   Special Requests NONE  Final   Gram Stain   Final    RARE WBC PRESENT, PREDOMINANTLY PMN NO ORGANISMS SEEN Performed at [facility], [address] [place], [place] [address]    Culture RARE METHICILLIN RESISTANT STAPHYLOCOCCUS AUREUS  Final    Report Status 03/27/2017 FINAL  Final   Organism ID, Bacteria METHICILLIN RESISTANT STAPHYLOCOCCUS AUREUS  Final      Susceptibility   Methicillin resistant staphylococcus aureus - MIC*    CIPROFLOXACIN <=0.5 SENSITIVE Sensitive     ERYTHROMYCIN >=8 RESISTANT Resistant     GENTAMICIN <=0.5 SENSITIVE Sensitive     OXACILLIN >=4 RESISTANT Resistant     TETRACYCLINE <=1 SENSITIVE Sensitive     VANCOMYCIN 1 SENSITIVE Sensitive     TRIMETH/SULFA <=10 SENSITIVE Sensitive     CLINDAMYCIN <=0.25 SENSITIVE Sensitive     RIFAMPIN <=0.5 SENSITIVE Sensitive     Inducible Clindamycin NEGATIVE Sensitive     * RARE METHICILLIN RESISTANT STAPHYLOCOCCUS AUREUS  Culture, Urine     Status: None   Collection Time: 03/25/2017  9:40 AM  Result Value Ref Range Status   Specimen Description URINE, CLEAN CATCH  Final   Special Requests NONE  Final   Culture   Final    NO GROWTH Performed at [facility], [address], [place], [place] [address]    Report Status 04/01/2017 FINAL  Final  Aerobic/Anaerobic Culture (surgical/deep wound)     Status: None (Preliminary result)   Collection Time: 03/04/2017  4:06 PM  Result Value Ref Range Status   Specimen Description CHEST RIGHT  Final   Special Requests Normal  Final   Gram Stain   Final    MODERATE WBC PRESENT, PREDOMINANTLY PMN NO ORGANISMS SEEN Performed at [facility], [address] [place], [place] [address]    Culture  Final    FEW STAPHYLOCOCCUS AUREUS SUSCEPTIBILITIES TO FOLLOW NO ANAEROBES ISOLATED; CULTURE IN PROGRESS FOR 5 DAYS    Report Status PENDING  Incomplete  Fungus Culture With Stain     Status: None (Preliminary result)   Collection Time: 03/07/2017  4:57 PM  Result Value Ref Range Status   Fungus Stain Final report  Final    Comment: (NOTE) Performed At: [facility] [address] [place], [place] [id number] Jolene Schimke MD WU:[id number]    Fungus (Mycology) Culture PENDING  Incomplete   Fungal Source  PERITONEAL  Final  Culture, body fluid-bottle     Status: None (Preliminary result)   Collection Time: 03/10/2017  4:57 PM  Result Value Ref Range Status   Specimen Description FLUID PERITONEAL  Final   Special Requests BOTTLES DRAWN AEROBIC AND ANAEROBIC  Final   Culture   Final    NO GROWTH 2 DAYS Performed at [facility], [address] [place], [place] [address]    Report Status PENDING  Incomplete  Gram stain     Status: None   Collection Time: 03/18/2017  4:57 PM  Result Value Ref Range Status   Specimen Description FLUID PERITONEAL  Final   Special Requests NONE  Final   Gram Stain   Final    RARE WBC PRESENT, PREDOMINANTLY PMN NO ORGANISMS SEEN Performed at [facility], [address], [place], [place] [address]    Report Status 04/01/2017 FINAL  Final  Fungus Culture Result     Status: None   Collection Time: 03/18/2017  4:57 PM  Result Value Ref Range Status   Result 1 Comment  Final    Comment: (NOTE) KOH/Calcofluor preparation:  no fungus observed. Performed At: [facility] [address] [place], [place] [id number] Jolene Schimke MD MW:[id number]     RADIOLOGY STUDIES/RESULTS: Dg Chest 2 View  Result Date: 03/22/2017 CLINICAL DATA:  Confusion. EXAM: CHEST  2 VIEW COMPARISON:  03/10/2017 FINDINGS: The cardiac silhouette, mediastinal and hilar contours are within normal limits and stable. Stable tortuosity of the thoracic aorta. The lungs demonstrate mild chronic bronchitic changes but no infiltrates, edema or effusions. IMPRESSION: No acute cardiopulmonary findings. Electronically Signed   By: Rudie Meyer M.D.   On: 03/08/2017 15:59   Dg Ankle Complete Left  Result Date: 03/23/2017 CLINICAL DATA:  67 year old male with chronic left ankle pain EXAM: LEFT ANKLE COMPLETE - 3+ VIEW COMPARISON:  Recent prior radiographs of the left ankle 03/04/2017 FINDINGS: Diffuse soft tissue swelling about the ankle. Probable small ankle joint effusion. No  significant interval change in the 2 days since the prior radiograph. No destructive bony changes. Mild tibiotalar and fibulotalar degenerative changes. IMPRESSION: 1. Persistent diffuse soft tissue swelling and small ankle joint effusion without evidence of fracture or malalignment. 2. Mild degenerative osteoarthritis again noted. No progressive or destructive bony changes at this time. Electronically Signed   By: Malachy Moan M.D.   On: 03/23/2017 19:05   Dg Ankle Complete Left  Result Date: 03/27/2017 CLINICAL DATA:  Swelling, pain and stiffness. EXAM: LEFT ANKLE COMPLETE - 3+ VIEW COMPARISON:  None. FINDINGS: There is no fracture or dislocation. Slight degenerative changes at the ankle joint with a small ankle effusion and soft tissue swelling around the ankle. IMPRESSION: 1. Slight arthritic changes at the ankle joint with a small joint effusion and soft tissue swelling. 2. No fractures. Electronically Signed   By: Francene Boyers M.D.   On: 03/20/2017 12:59  Ct Chest Wo Contrast  Result Date: 03/27/2017 CLINICAL DATA:  Hemoptysis, abdominal pain, confusion, cirrhosis secondary to hepatitis C, diabetes mellitus, leukocytosis, UTI, MRSA bacteremia EXAM: CT CHEST WITHOUT CONTRAST TECHNIQUE: Multidetector CT imaging of the chest was performed following the standard protocol without IV contrast. Sagittal and coronal MPR images reconstructed from axial data set. 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 Scattered atherosclerotic calcifications aorta and coronary arteries. No pericardial effusion. Mediastinum/Nodes: Esophagus unremarkable. Scattered normal size mediastinal lymph nodes without thoracic adenopathy. Base of cervical region unremarkable. Lungs/Pleura: Dependent atelectasis RIGHT lower lobe. Area of opacity at the lateral aspect of the RIGHT upper lobe anteriorly, 4.8 x 4.5 x 3.0 cm. Minimal  surrounding infiltrate. This process extends into the anterolateral RIGHT chest wall with destruction of the RIGHT third rib company by pathologic fracture. Additionally, the RIGHT upper lobe process is contiguous through the chest wall with a subpectoral gas and fluid collection external to the ribs in the anterolateral upper RIGHT chest, 10.3 x 3.2 cm in greatest axial dimensions and extending for 5 cm length. Bronchiectasis and atelectasis at anterior base of RIGHT middle lobe. Remaining lungs clear. Upper Abdomen: Cirrhotic liver with multiple perigastric and perisplenic varices in the upper abdomen with a spontaneous splenorenal shunt better demonstrated on CT abdomen. Moderate ascites. Large area of developing low attenuation within the central spleen question splenic infarct versus developing splenic abscess new since 03/25/2017. Distended gallbladder. Musculoskeletal: No additional osseous findings. IMPRESSION: Consolidation at the anterolateral RIGHT upper lobe measuring 4.8 x 4.5 x 3.0 cm in size, contiguous with infiltrative process through the anterolateral RIGHT chest wall extending into the extra osseous soft tissues deep to the RIGHT pectoralis muscle where a gas and fluid collection is seen measuring 10.3 x 3.2 x 5.0 cm. Findings may either represent RIGHT upper lobe pneumonia with extension of infection through the chest wall associated with osteomyelitis of the anterolateral RIGHT third rib and accompanying chest wall abscess or RIGHT upper lobe tumor extending involving the RIGHT upper lobe and chest wall. The lack of RIGHT upper lobe opacity or RIGHT third rib destruction on chest radiograph at presentation on 03/10/2017 makes infection (pneumonia with chest wall extension and subpectoral abscess) a more likely etiology than tumor. Cirrhotic liver with numerous varices in the upper abdomen and a new area of low attenuation within the central spleen which could represent an infarct or developing  infection/abscess. Electronically Signed   By: Ulyses Southward M.D.   On: 03/27/2017 16:09   Ct Abdomen Pelvis W Contrast  Result Date: 03/28/2017 CLINICAL DATA:  67 year old male with history of abdominal pain, confusion and hip pain. Jaundice. EXAM: CT ABDOMEN AND PELVIS WITH CONTRAST TECHNIQUE: Multidetector CT imaging of the abdomen and pelvis was performed using the standard protocol following bolus administration of intravenous contrast. CONTRAST:  ISOVUE-300 IOPAMIDOL (ISOVUE-300) INJECTION 61% COMPARISON:  No priors. FINDINGS: Lower chest: Areas of mild cylindrical bronchiectasis are noted in the lung bases, most evident in the right middle lobe where there is also extensive thickening of the peribronchovascular interstitium and associated peribronchovascular micro and macronodularity as well as regional architectural distortion, most compatible with areas of chronic mucoid impaction in the setting of chronic post infectious or inflammatory scarring. Hepatobiliary: Liver has a shrunken appearance and nodular contour, compatible with advanced cirrhosis. Contrast bolus is suboptimal on today's examination, and with this limitation in mind there is no discrete hypervascular lesion confidently identified.  No intra or extrahepatic biliary ductal dilatation. Gallbladder is normal in appearance. Pancreas: No pancreatic mass. No pancreatic ductal dilatation. No pancreatic or peripancreatic fluid or inflammatory changes. Spleen: Spleen is enlarged measuring 18.0 x 8.4 x 17.1 cm (estimated splenic volume of 1,293 mL) . Adrenals/Urinary Tract: There are multiple nonobstructive calculi throughout the collecting systems of both kidneys measuring up to 4 mm in the interpolar collecting system of the right kidney. No additional calculi are noted along the course of either ureter or within the lumen of the urinary bladder. No hydroureteronephrosis. Mild multifocal cortical thinning in the right kidney. 5.6 cm simple  cyst in the interpolar region of the left kidney. No suspicious renal lesions. Urinary bladder is normal in appearance. Bilateral adrenal glands are normal in appearance. Stomach/Bowel: Normal appearance of the stomach. No pathologic dilatation of small bowel or colon. Numerous colonic diverticulae are noted, without surrounding inflammatory changes to suggest an acute diverticulitis at this time. Normal appendix. Vascular/Lymphatic: Aortic atherosclerosis, without evidence of aneurysm or dissection in the abdominal or pelvic vasculature. Numerous portosystemic collateral pathways, most evident for large splenorenal collaterals and small proximal gastric varices. Portal vein remains patent at this time and is dilated measuring up to 18 mm in diameter in the porta hepatis. No lymphadenopathy. Reproductive: Prostate gland and seminal vesicles are unremarkable in appearance. Other: Small volume of ascites.  No pneumoperitoneum. Musculoskeletal: There are no aggressive appearing lytic or blastic lesions noted in the visualized portions of the skeleton. IMPRESSION: 1. Morphologic changes of advanced cirrhosis with evidence of portal hypertension, including dilated portal vein and splenomegaly with numerous portosystemic collateral pathways. Previously identified lesion in segment 8 is not confidently noted on today's examination. 2. Numerous nonobstructive calculi within the collecting systems of both kidneys. No ureteral stones or findings of urinary tract obstruction are noted at this time. 3. Aortic atherosclerosis. 4. Colonic diverticulosis without evidence of acute diverticulitis at this time. Electronically Signed   By: Trudie Reedaniel  Entrikin M.D.   On: 03/30/2017 14:08   [place] Renal  Result Date: 03/30/2017 CLINICAL DATA:  Acute renal failure EXAM: RENAL / URINARY TRACT ULTRASOUND COMPLETE COMPARISON:  03/17/2017. FINDINGS: Right Kidney: Length: 11.3 cm. Scattered nonobstructing renal calculi are noted similar to that  seen on prior CT examination. Left Kidney: Length: 11.3 cm. Scattered nonobstructing renal stones are noted. Known cystic lesion seen on recent CT is not as well appreciated on today's exam. Bladder: Not well distended. IMPRESSION: Bilateral nonobstructing renal stones similar to that seen on prior CT. Previously noted left renal cyst is not well appreciated on this exam. Electronically Signed   By: Alcide CleverMark  Lukens M.D.   On: 03/30/2017 10:30   Mr Ankle Left Wo Contrast  Result Date: 03/24/2017 CLINICAL DATA:  Ankle swelling.  Poor historian. EXAM: MRI OF THE LEFT ANKLE WITHOUT CONTRAST TECHNIQUE: Multiplanar, multisequence MR imaging of the ankle was performed. No intravenous contrast was administered. COMPARISON:  None. FINDINGS: Patient refused to complete the exam per technologist report. Contrast-enhanced images with unable to be acquired. TENDONS Peroneal: Intact peroneus longus and peroneus brevis tendons. Posteromedial: Intact tibialis posterior, flexor hallucis longus and flexor digitorum longus tendons. Mild-to-moderate amount of fluid along the flexor digitorum longus tendons consistent with tenosynovitis. Anterior: Intact tibialis anterior, extensor hallucis longus and extensor digitorum longus tendons. Achilles: Intact. Plantar Fascia: Intact. LIGAMENTS Lateral: Intact. Medial: Intact. CARTILAGE Ankle Joint: Small joint effusion. Cartilaginous thinning of the ankle joint. Subtalar Joints/Sinus Tarsi: No joint effusion or chondral defect. Bones: Mild marrow edema  of the medial and lateral malleoli. No joint dislocation or suspicious osseous lesions. Mild degenerative sub chondral cystic change and edema of the talar dome. Other: Moderate diffuse soft tissue swelling of the included leg, ankle and dorsum of the mid and included forefoot. IMPRESSION: 1. There is moderate diffuse soft tissue swelling of the included distal leg, ankle and dorsum of the mid and included forefoot. Findings are nonspecific but  can be seen in third spacing of fluid, venous stasis or possibly cellulitis. No abscess collections. 2. Nonspecific mild marrow edema of the mediolateral malleoli possibly posttraumatic in etiology. Fractures are not conclusively identified. No joint dislocation is seen. 3. 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 4. Tenosynovitis of the flexor digitorum longus Electronically Signed   By: Tollie Eth M.D.   On: 03/24/2017 14:16   Mr Hip Right Wo Contrast  Result Date: 03/22/2017 CLINICAL DATA:  Bilateral hip pain. Recent fall. Age-indeterminate fracture of the right greater trochanter on radiographs. EXAM: MR OF THE RIGHT HIP WITHOUT CONTRAST TECHNIQUE: Multiplanar, multisequence MR imaging was performed. No intravenous contrast was administered. COMPARISON:  Pelvic CT 03/30/2017, hip radiographs 03/30/2017 and right hip CT 01/28/2015. FINDINGS: Despite efforts by the technologist and patient, mild motion artifact is present on today's exam and could not be eliminated. This reduces exam sensitivity and specificity. Bones: No evidence of acute fracture, dislocation or femoral head avascular necrosis. There is a chronic fracture of the right femoral greater trochanter which is posteriorly displaced without healing (nonunion). This was demonstrated as an acute injury on the right hip CT 01/28/2015. Lower lumbar spondylosis noted. The sacroiliac joints appear normal. Articular cartilage and labrum Articular cartilage:  Mild degenerative changes of both hips. Labrum:  No gross labral tear or paralabral cyst. Joint or bursal effusion Joint effusion:  No significant hip joint effusion. Bursae: No focal periarticular fluid collections are identified. The right hip periarticular soft tissues appear unremarkable. However, on the left, there is prominent soft tissue edema surrounding the hip, greatest posteriorly. This tracks along the gemellus and obturator internus muscles. No focal fluid  collection identified. Muscles and tendons Muscles and tendons: As above, there is periarticular soft tissue edema surrounding the left hip, involving gemellus and obturator internus muscles. There is a component along the medial pelvic side wall. There is also some edema within the gluteus musculature. The left common hamstring tendon demonstrates tendinosis and partial tearing. No significant right hip tendon abnormalities are seen. The right piriformis muscle is chronically atrophied. Other findings Miscellaneous:  Mild ascites noted. IMPRESSION: 1. 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 This fracture was demonstrated as an acute injury in 2016. 2. No acute osseous findings identified. No significant hip or sacroiliac joint effusion. 3. Soft tissue edema surrounding the left hip, likely posttraumatic or potentially inflammatory. No focal abscess. The left common hamstring tendon appears partially torn, and this could be the etiology. Electronically Signed   By: Carey Bullocks M.D.   On: 03/22/2017 11:53   Ir US Guide Bx Asp/drain  Result Date: 03/26/2017 INDICATION: 67 year old with left ankle swelling. Left ankle effusion identified on MRI. EXAM: ULTRASOUND-GUIDED ASPIRATION OF LEFT ANKLE JOINT EFFUSION MEDICATIONS: None ANESTHESIA/SEDATION: None COMPLICATIONS: None immediate. PROCEDURE: Patient is confused and unable to give consent. Consent was obtained for the patient's wife via the telephone. The left ankle was evaluated with ultrasound. Ankle effusion was identified along the anterolateral aspect of the ankle. The skin was prepped with chlorhexidine. A sterile field was  created. Skin was anesthetized with 1% lidocaine. Using ultrasound guidance, 18 gauge spinal needle was directed into the joint effusion. Initially 3 mL thick yellow fluid was aspirated. 2 mL of additional bloody fluid was removed. Fluid was sent for analysis. Bandage placed over the puncture site. FINDINGS:  Effusion along the anterolateral aspect of the ankle joint. 5 mL of fluid was removed. IMPRESSION: Successful ultrasound-guided aspiration of the left ankle joint effusion. Electronically Signed   By: Richarda Overlie M.D.   On: 03/26/2017 14:51   Ct Biopsy  Result Date: 03/18/2017 INDICATION: Right anterior chest wall hematoma versus mass or abscess EXAM: CT-GUIDED BIOPSY RIGHT CHEST WALL ABNORMALITY MEDICATIONS: 1% lidocaine local ANESTHESIA/SEDATION: Moderate Sedation Time:  None. The patient was continuously monitored during the procedure by the interventional radiology nurse under my direct supervision. PROCEDURE: The procedure, risks, benefits, and alternatives were explained to the patient. Questions regarding the procedure were encouraged and answered. The patient understands and consents to the procedure. Previous imaging reviewed. Patient positioned supine. Noncontrast localization CT performed to demonstrate the right anterior chest wall mass/abnormality. Overlying skin marked for a medial approach. Under sterile conditions and local anesthesia, a 17 gauge coaxial guide needle was advanced from an anterior oblique approach into the chest wall abnormality. Needle position confirmed with CT. Syringe aspiration yielded dark bloody fluid. Attempts were made core biopsy. Sampling was scant and low yield. Of note, there is an adjacent acute right anterior rib fracture. Because of the poor sampling, chest wall hematoma is favored. Air within the soft tissue abnormality may be secondary to adjacent lung injury. Patient tolerated the procedure well without complication. Vital sign monitoring by nursing staff during the procedure will continue as patient is in the special procedures unit for post procedure observation. FINDINGS: The images document guide needle placement within the right chest wall abnormality. Post biopsy images demonstrate no significant interval change. COMPLICATIONS: None immediate. IMPRESSION:  Successful CT-guided aspiration and attempted core biopsy of the right chest wall abnormality. Sample sent for pathology, cytology, and culture. Chest wall hematoma is favored. Electronically Signed   By: Judie Petit.  Shick M.D.   On: 03/13/2017 16:40   Dg Chest Port 1 View  Result Date: 03/27/2017 CLINICAL DATA:  Subcutaneous emphysema.  Hemoptysis. EXAM: PORTABLE CHEST 1 VIEW COMPARISON:  03/07/2017 FINDINGS: The cardiomediastinal silhouette is unchanged. Heart size is within normal limits. Thoracic aorta is tortuous. Chronic bronchitic changes are again noted. There is new hazy airspace opacity in the lateral right mid to upper lung. The left lung remains clear. No sizable pleural effusion or pneumothorax is identified. There is persistent mild elevation of the right hemidiaphragm. IMPRESSION: New right mid upper lung airspace opacity which may reflect pneumonia or hemorrhage. Electronically Signed   By: Sebastian Ache M.D.   On: 03/27/2017 12:15   Dg Hips Bilat W Or Wo Pelvis 3-4 Views  Result Date: 04/02/2017 CLINICAL DATA:  Suspected hip pain with decreased motility. EXAM: DG HIP (WITH OR WITHOUT PELVIS) 3-4V BILAT COMPARISON:  None. FINDINGS: There is an age-indeterminate avulsion fracture of the right greater trochanter. Otherwise, there is no evidence of fracture or dislocation. Minimal osteoarthritic changes of the left hip. IMPRESSION: Age-indeterminate avulsion fracture of the right greater trochanter. No evidence of left hip fracture. Electronically Signed   By: Ted Mcalpine M.D.   On: 04/02/2017 12:58   Ir Paracentesis  Result Date: 03/05/2017 INDICATION: Cirrhosis, ascites, abdominal distension EXAM: ULTRASOUND GUIDED PARACENTESIS MEDICATIONS: 1% lidocaine local COMPLICATIONS: None immediate. PROCEDURE: An ultrasound  guided paracentesis was thoroughly discussed with the patient and questions answered. The benefits, risks, alternatives and complications were also discussed. The patient  understands and wishes to proceed with the procedure. Written consent was obtained. Ultrasound was performed to localize and mark an adequate pocket of fluid in the right upper quadrant of the abdomen. The area was then prepped and draped in the normal sterile fashion. 1% Lidocaine was used for local anesthesia. Under ultrasound guidance a Safe-T-Centesis needle catheter was introduced. Paracentesis was performed. The catheter was removed and a dressing applied. FINDINGS: A total of approximately 2 L of clear ascitic fluid was removed. A fluid sample was sent for laboratory analysis. IMPRESSION: Successful ultrasound guided paracentesis yielding 2 L of ascites. Electronically Signed   By: Judie Petit.  Shick M.D.   On: 03/05/2017 16:52     LOS: 13 days   Jeoffrey Massed, MD  [facility] Pager:[phone]  If 7PM-7AM, please contact night-coverage [email or web address] Password [email or web address] 21-Apr-2017, 11:11 AM

## 2017-04-04 NOTE — Progress Notes (Signed)
Date: April 03, 2017 Marcelle SmilingRhonda Davis, BSN, [place]3, [place] [phone] Chart and notes review for patient progress and needs. Will follow for case management and discharge needs./ pstient being made full comfort care expect transition to hospice care today [phone].  Family meeting to take place. No cm or discharge needs present at time of this review. Next review date: [id number]

## 2017-04-04 NOTE — Progress Notes (Cosign Needed)
230 mls of Morphine wasted in Sink at 2345.  Witness was Debbie RN 

## 2017-04-04 DEATH — deceased

## 2017-04-05 LAB — AEROBIC/ANAEROBIC CULTURE W GRAM STAIN (SURGICAL/DEEP WOUND)

## 2017-04-05 LAB — CULTURE, BODY FLUID-BOTTLE

## 2017-04-05 LAB — CULTURE, BODY FLUID W GRAM STAIN -BOTTLE: Culture: NO GROWTH

## 2017-04-05 LAB — AEROBIC/ANAEROBIC CULTURE (SURGICAL/DEEP WOUND): SPECIAL REQUESTS: NORMAL

## 2017-04-29 LAB — FUNGUS CULTURE WITH STAIN

## 2017-04-29 LAB — FUNGUS CULTURE RESULT

## 2017-04-29 LAB — FUNGAL ORGANISM REFLEX

## 2017-05-02 NOTE — Death Summary Note (Signed)
DEATH SUMMARY   Patient Details  Name: Kurt Reid MRN: [id number] DOB: 1950-08-07  Admission/Discharge Information   Admit Date:  2017-04-10  Date of Death: Date of Death: 04/23/17  Time of Death: Time of Death: 2020-07-30  Length of Stay: 08-05-22  Referring Physician: [name], MD   Reason(s) for Hospitalization  Sepsis secondary to disseminated MRSA infection Acute metabolic encephalopathy   Diagnoses  Preliminary cause of death:  Secondary Diagnoses (including complications and co-morbidities):  Principal Problem:   Sepsis due to methicillin resistant Staphylococcus aureus (HCC) Active Problems:   BIPOLAR AFFECTIVE DISORDER   Chronic pain syndrome   Esophageal varices (HCC)   Goals of care, counseling/discussion   Hepatic cirrhosis due to chronic hepatitis C infection (HCC)   Hemoptysis   DM type 2 without retinopathy (HCC)   Chronic hepatitis C (HCC)   Leukocytosis   Sepsis (HCC)   Altered mental status   Lower urinary tract infectious disease   Delirium due to another medical condition   Ankle pain, left   Left ankle effusion   Hematemesis   Acute urinary retention   Staphylococcal arthritis of left ankle (HCC)   Abscess of upper lobe of right lung with pneumonia (HCC)   Hyperkalemia   Hyperbilirubinemia   ARF (acute renal failure) (HCC)   Ascites   Mass of chest wall, right   Subcutaneous emphysema (HCC)   Multi-organ failure with liver failure (HCC)   Palliative care encounter   Cirrhosis of liver with ascites (HCC)   Comfort measures only status   Brief Hospital Course (including significant findings, care, treatment, and services provided and events leading to death)  Kurt Reid is a 67 y.o. year old male with history of cirrhosis secondary to hepatitis C/portal hypertension/esophageal varices-admitted with confusion-further evaluation revealed disseminated MRSA infection.  He was started on vancomycin but developed acute kidney injury.  Hospital  course was complicated by worsening encephalopathy, decompensated liver cirrhosis and atrial fibrillation with RVR.  He was felt to have significantly poor prognosis in spite of having aggressive IV antimicrobial therapy and a prolonged hospital course.  Palliative care was consulted.  Nephrology, infectious disease and GI were consulted as well.  Eventually due to continued deterioration-he was made a DNR-and family elected to pursue comfort measures.  He was subsequently started on a morphine infusion-patient passed away on April 23, 2022 at 10:22 PM   Note-see the progress note on 23-Apr-2022 for further details   Pertinent Labs and Studies  Significant Diagnostic Studies Dg Chest 2 View  Result Date: 04-10-2017 CLINICAL DATA:  Confusion. EXAM: CHEST  2 VIEW COMPARISON:  03/10/2017 FINDINGS: The cardiac silhouette, mediastinal and hilar contours are within normal limits and stable. Stable tortuosity of the thoracic aorta. The lungs demonstrate mild chronic bronchitic changes but no infiltrates, edema or effusions. IMPRESSION: No acute cardiopulmonary findings. Electronically Signed   By: Rudie Meyer M.D.   On: 2017/04/10 15:59   Dg Ankle Complete Left  Result Date: 03/23/2017 CLINICAL DATA:  67 year old male with chronic left ankle pain EXAM: LEFT ANKLE COMPLETE - 3+ VIEW COMPARISON:  Recent prior radiographs of the left ankle 10-Apr-2017 FINDINGS: Diffuse soft tissue swelling about the ankle. Probable small ankle joint effusion. No significant interval change in the 2 days since the prior radiograph. No destructive bony changes. Mild tibiotalar and fibulotalar degenerative changes. IMPRESSION: 1. Persistent diffuse soft tissue swelling and small ankle joint effusion without evidence of fracture or malalignment. 2. Mild degenerative osteoarthritis again noted. No progressive  or destructive bony changes at this time. Electronically Signed   By: Malachy Moan M.D.   On: 03/23/2017 19:05   Dg Ankle Complete  Left  Result Date: 03/10/2017 CLINICAL DATA:  Swelling, pain and stiffness. EXAM: LEFT ANKLE COMPLETE - 3+ VIEW COMPARISON:  None. FINDINGS: There is no fracture or dislocation. Slight degenerative changes at the ankle joint with a small ankle effusion and soft tissue swelling around the ankle. IMPRESSION: 1. Slight arthritic changes at the ankle joint with a small joint effusion and soft tissue swelling. 2. No fractures. Electronically Signed   By: Francene Boyers M.D.   On: 03/31/2017 12:59   Ct Chest Wo Contrast  Result Date: 03/27/2017 CLINICAL DATA:  Hemoptysis, abdominal pain, confusion, cirrhosis secondary to hepatitis C, diabetes mellitus, leukocytosis, UTI, MRSA bacteremia EXAM: CT CHEST WITHOUT CONTRAST TECHNIQUE: Multidetector CT imaging of the chest was performed following the standard protocol without IV contrast. Sagittal and coronal MPR images reconstructed from axial data set. 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 Scattered atherosclerotic calcifications aorta and coronary arteries. No pericardial effusion. Mediastinum/Nodes: Esophagus unremarkable. Scattered normal size mediastinal lymph nodes without thoracic adenopathy. Base of cervical region unremarkable. Lungs/Pleura: Dependent atelectasis RIGHT lower lobe. Area of opacity at the lateral aspect of the RIGHT upper lobe anteriorly, 4.8 x 4.5 x 3.0 cm. Minimal surrounding infiltrate. This process extends into the anterolateral RIGHT chest wall with destruction of the RIGHT third rib company by pathologic fracture. Additionally, the RIGHT upper lobe process is contiguous through the chest wall with a subpectoral gas and fluid collection external to the ribs in the anterolateral upper RIGHT chest, 10.3 x 3.2 cm in greatest axial dimensions and extending for 5 cm length. Bronchiectasis and atelectasis at anterior base of RIGHT middle lobe.  Remaining lungs clear. Upper Abdomen: Cirrhotic liver with multiple perigastric and perisplenic varices in the upper abdomen with a spontaneous splenorenal shunt better demonstrated on CT abdomen. Moderate ascites. Large area of developing low attenuation within the central spleen question splenic infarct versus developing splenic abscess new since 03/08/2017. Distended gallbladder. Musculoskeletal: No additional osseous findings. IMPRESSION: Consolidation at the anterolateral RIGHT upper lobe measuring 4.8 x 4.5 x 3.0 cm in size, contiguous with infiltrative process through the anterolateral RIGHT chest wall extending into the extra osseous soft tissues deep to the RIGHT pectoralis muscle where a gas and fluid collection is seen measuring 10.3 x 3.2 x 5.0 cm. Findings may either represent RIGHT upper lobe pneumonia with extension of infection through the chest wall associated with osteomyelitis of the anterolateral RIGHT third rib and accompanying chest wall abscess or RIGHT upper lobe tumor extending involving the RIGHT upper lobe and chest wall. The lack of RIGHT upper lobe opacity or RIGHT third rib destruction on chest radiograph at presentation on 03/10/2017 makes infection (pneumonia with chest wall extension and subpectoral abscess) a more likely etiology than tumor. Cirrhotic liver with numerous varices in the upper abdomen and a new area of low attenuation within the central spleen which could represent an infarct or developing infection/abscess. Electronically Signed   By: Ulyses Southward M.D.   On: 03/27/2017 16:09   Ct Abdomen Pelvis W Contrast  Result Date: 03/17/2017 CLINICAL DATA:  67 year old male with history of abdominal pain, confusion and hip pain. Jaundice. EXAM: CT ABDOMEN AND PELVIS WITH CONTRAST TECHNIQUE: Multidetector CT imaging of the abdomen and pelvis was performed using the standard protocol following bolus  administration of intravenous contrast. CONTRAST:  100mL ISOVUE-300 IOPAMIDOL  (ISOVUE-300) INJECTION 61% COMPARISON:  No priors. FINDINGS: Lower chest: Areas of mild cylindrical bronchiectasis are noted in the lung bases, most evident in the right middle lobe where there is also extensive thickening of the peribronchovascular interstitium and associated peribronchovascular micro and macronodularity as well as regional architectural distortion, most compatible with areas of chronic mucoid impaction in the setting of chronic post infectious or inflammatory scarring. Hepatobiliary: Liver has a shrunken appearance and nodular contour, compatible with advanced cirrhosis. Contrast bolus is suboptimal on today's examination, and with this limitation in mind there is no discrete hypervascular lesion confidently identified. No intra or extrahepatic biliary ductal dilatation. Gallbladder is normal in appearance. Pancreas: No pancreatic mass. No pancreatic ductal dilatation. No pancreatic or peripancreatic fluid or inflammatory changes. Spleen: Spleen is enlarged measuring 18.0 x 8.4 x 17.1 cm (estimated splenic volume of 1,293 mL) . Adrenals/Urinary Tract: There are multiple nonobstructive calculi throughout the collecting systems of both kidneys measuring up to 4 mm in the interpolar collecting system of the right kidney. No additional calculi are noted along the course of either ureter or within the lumen of the urinary bladder. No hydroureteronephrosis. Mild multifocal cortical thinning in the right kidney. 5.6 cm simple cyst in the interpolar region of the left kidney. No suspicious renal lesions. Urinary bladder is normal in appearance. Bilateral adrenal glands are normal in appearance. Stomach/Bowel: Normal appearance of the stomach. No pathologic dilatation of small bowel or colon. Numerous colonic diverticulae are noted, without surrounding inflammatory changes to suggest an acute diverticulitis at this time. Normal appendix. Vascular/Lymphatic: Aortic atherosclerosis, without evidence of  aneurysm or dissection in the abdominal or pelvic vasculature. Numerous portosystemic collateral pathways, most evident for large splenorenal collaterals and small proximal gastric varices. Portal vein remains patent at this time and is dilated measuring up to 18 mm in diameter in the porta hepatis. No lymphadenopathy. Reproductive: Prostate gland and seminal vesicles are unremarkable in appearance. Other: Small volume of ascites.  No pneumoperitoneum. Musculoskeletal: There are no aggressive appearing lytic or blastic lesions noted in the visualized portions of the skeleton. IMPRESSION: 1. Morphologic changes of advanced cirrhosis with evidence of portal hypertension, including dilated portal vein and splenomegaly with numerous portosystemic collateral pathways. Previously identified lesion in segment 8 is not confidently noted on today's examination. 2. Numerous nonobstructive calculi within the collecting systems of both kidneys. No ureteral stones or findings of urinary tract obstruction are noted at this time. 3. Aortic atherosclerosis. 4. Colonic diverticulosis without evidence of acute diverticulitis at this time. Electronically Signed   By: Trudie Reedaniel  Entrikin M.D.   On: 03/19/2017 14:08   [place] Renal  Result Date: 03/30/2017 CLINICAL DATA:  Acute renal failure EXAM: RENAL / URINARY TRACT ULTRASOUND COMPLETE COMPARISON:  03/19/2017. FINDINGS: Right Kidney: Length: 11.3 cm. Scattered nonobstructing renal calculi are noted similar to that seen on prior CT examination. Left Kidney: Length: 11.3 cm. Scattered nonobstructing renal stones are noted. Known cystic lesion seen on recent CT is not as well appreciated on today's exam. Bladder: Not well distended. IMPRESSION: Bilateral nonobstructing renal stones similar to that seen on prior CT. Previously noted left renal cyst is not well appreciated on this exam. Electronically Signed   By: Alcide CleverMark  Lukens M.D.   On: 03/30/2017 10:30   Mr Ankle Left Wo  Contrast  Result Date: 03/24/2017 CLINICAL DATA:  Ankle swelling.  Poor historian. EXAM: MRI OF THE LEFT ANKLE WITHOUT CONTRAST TECHNIQUE: Multiplanar, multisequence MR  imaging of the ankle was performed. No intravenous contrast was administered. COMPARISON:  None. FINDINGS: Patient refused to complete the exam per technologist report. Contrast-enhanced images with unable to be acquired. TENDONS Peroneal: Intact peroneus longus and peroneus brevis tendons. Posteromedial: Intact tibialis posterior, flexor hallucis longus and flexor digitorum longus tendons. Mild-to-moderate amount of fluid along the flexor digitorum longus tendons consistent with tenosynovitis. Anterior: Intact tibialis anterior, extensor hallucis longus and extensor digitorum longus tendons. Achilles: Intact. Plantar Fascia: Intact. LIGAMENTS Lateral: Intact. Medial: Intact. CARTILAGE Ankle Joint: Small joint effusion. Cartilaginous thinning of the ankle joint. Subtalar Joints/Sinus Tarsi: No joint effusion or chondral defect. Bones: Mild marrow edema of the medial and lateral malleoli. No joint dislocation or suspicious osseous lesions. Mild degenerative sub chondral cystic change and edema of the talar dome. Other: Moderate diffuse soft tissue swelling of the included leg, ankle and dorsum of the mid and included forefoot. IMPRESSION: 1. There is moderate diffuse soft tissue swelling of the included distal leg, ankle and dorsum of the mid and included forefoot. Findings are nonspecific but can be seen in third spacing of fluid, venous stasis or possibly cellulitis. No abscess collections. 2. Nonspecific mild marrow edema of the mediolateral malleoli possibly posttraumatic in etiology. Fractures are not conclusively identified. No joint dislocation is seen. 3. 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 4. Tenosynovitis of the flexor digitorum longus Electronically Signed   By: Tollie Eth M.D.   On: 03/24/2017 14:16    Mr Hip Right Wo Contrast  Result Date: 03/22/2017 CLINICAL DATA:  Bilateral hip pain. Recent fall. Age-indeterminate fracture of the right greater trochanter on radiographs. EXAM: MR OF THE RIGHT HIP WITHOUT CONTRAST TECHNIQUE: Multiplanar, multisequence MR imaging was performed. No intravenous contrast was administered. COMPARISON:  Pelvic CT 04/02/2017, hip radiographs 03/09/2017 and right hip CT 01/28/2015. FINDINGS: Despite efforts by the technologist and patient, mild motion artifact is present on today's exam and could not be eliminated. This reduces exam sensitivity and specificity. Bones: No evidence of acute fracture, dislocation or femoral head avascular necrosis. There is a chronic fracture of the right femoral greater trochanter which is posteriorly displaced without healing (nonunion). This was demonstrated as an acute injury on the right hip CT 01/28/2015. Lower lumbar spondylosis noted. The sacroiliac joints appear normal. Articular cartilage and labrum Articular cartilage:  Mild degenerative changes of both hips. Labrum:  No gross labral tear or paralabral cyst. Joint or bursal effusion Joint effusion:  No significant hip joint effusion. Bursae: No focal periarticular fluid collections are identified. The right hip periarticular soft tissues appear unremarkable. However, on the left, there is prominent soft tissue edema surrounding the hip, greatest posteriorly. This tracks along the gemellus and obturator internus muscles. No focal fluid collection identified. Muscles and tendons Muscles and tendons: As above, there is periarticular soft tissue edema surrounding the left hip, involving gemellus and obturator internus muscles. There is a component along the medial pelvic side wall. There is also some edema within the gluteus musculature. The left common hamstring tendon demonstrates tendinosis and partial tearing. No significant right hip tendon abnormalities are seen. The right piriformis  muscle is chronically atrophied. Other findings Miscellaneous:  Mild ascites noted. IMPRESSION: 1. 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 This fracture was demonstrated as an acute injury in 2016. 2. No acute osseous findings identified. No significant hip or sacroiliac joint effusion. 3. Soft tissue edema surrounding the left hip, likely posttraumatic or potentially inflammatory. No focal abscess. The  left common hamstring tendon appears partially torn, and this could be the etiology. Electronically Signed   By: Carey Bullocks M.D.   On: 03/22/2017 11:53   Ir US Guide Bx Asp/drain  Result Date: 03/26/2017 INDICATION: 67 year old with left ankle swelling. Left ankle effusion identified on MRI. EXAM: ULTRASOUND-GUIDED ASPIRATION OF LEFT ANKLE JOINT EFFUSION MEDICATIONS: None ANESTHESIA/SEDATION: None COMPLICATIONS: None immediate. PROCEDURE: Patient is confused and unable to give consent. Consent was obtained for the patient's wife via the telephone. The left ankle was evaluated with ultrasound. Ankle effusion was identified along the anterolateral aspect of the ankle. The skin was prepped with chlorhexidine. A sterile field was created. Skin was anesthetized with 1% lidocaine. Using ultrasound guidance, 18 gauge spinal needle was directed into the joint effusion. Initially 3 mL thick yellow fluid was aspirated. 2 mL of additional bloody fluid was removed. Fluid was sent for analysis. Bandage placed over the puncture site. FINDINGS: Effusion along the anterolateral aspect of the ankle joint. 5 mL of fluid was removed. IMPRESSION: Successful ultrasound-guided aspiration of the left ankle joint effusion. Electronically Signed   By: Richarda Overlie M.D.   On: 03/26/2017 14:51   Ct Biopsy  Result Date: 03/29/2017 INDICATION: Right anterior chest wall hematoma versus mass or abscess EXAM: CT-GUIDED BIOPSY RIGHT CHEST WALL ABNORMALITY MEDICATIONS: 1% lidocaine local ANESTHESIA/SEDATION:  Moderate Sedation Time:  None. The patient was continuously monitored during the procedure by the interventional radiology nurse under my direct supervision. PROCEDURE: The procedure, risks, benefits, and alternatives were explained to the patient. Questions regarding the procedure were encouraged and answered. The patient understands and consents to the procedure. Previous imaging reviewed. Patient positioned supine. Noncontrast localization CT performed to demonstrate the right anterior chest wall mass/abnormality. Overlying skin marked for a medial approach. Under sterile conditions and local anesthesia, a 17 gauge coaxial guide needle was advanced from an anterior oblique approach into the chest wall abnormality. Needle position confirmed with CT. Syringe aspiration yielded dark bloody fluid. Attempts were made core biopsy. Sampling was scant and low yield. Of note, there is an adjacent acute right anterior rib fracture. Because of the poor sampling, chest wall hematoma is favored. Air within the soft tissue abnormality may be secondary to adjacent lung injury. Patient tolerated the procedure well without complication. Vital sign monitoring by nursing staff during the procedure will continue as patient is in the special procedures unit for post procedure observation. FINDINGS: The images document guide needle placement within the right chest wall abnormality. Post biopsy images demonstrate no significant interval change. COMPLICATIONS: None immediate. IMPRESSION: Successful CT-guided aspiration and attempted core biopsy of the right chest wall abnormality. Sample sent for pathology, cytology, and culture. Chest wall hematoma is favored. Electronically Signed   By: Judie Petit.  Shick M.D.   On: 03/21/2017 16:40   Dg Chest Port 1 View  Result Date: 03/27/2017 CLINICAL DATA:  Subcutaneous emphysema.  Hemoptysis. EXAM: PORTABLE CHEST 1 VIEW COMPARISON:  03/07/2017 FINDINGS: The cardiomediastinal silhouette is unchanged.  Heart size is within normal limits. Thoracic aorta is tortuous. Chronic bronchitic changes are again noted. There is new hazy airspace opacity in the lateral right mid to upper lung. The left lung remains clear. No sizable pleural effusion or pneumothorax is identified. There is persistent mild elevation of the right hemidiaphragm. IMPRESSION: New right mid upper lung airspace opacity which may reflect pneumonia or hemorrhage. Electronically Signed   By: Sebastian Ache M.D.   On: 03/27/2017 12:15   Dg Hips Bilat W Or Wo  Pelvis 3-4 Views  Result Date: 03/14/2017 CLINICAL DATA:  Suspected hip pain with decreased motility. EXAM: DG HIP (WITH OR WITHOUT PELVIS) 3-4V BILAT COMPARISON:  None. FINDINGS: There is an age-indeterminate avulsion fracture of the right greater trochanter. Otherwise, there is no evidence of fracture or dislocation. Minimal osteoarthritic changes of the left hip. IMPRESSION: Age-indeterminate avulsion fracture of the right greater trochanter. No evidence of left hip fracture. Electronically Signed   By: Ted Mcalpine M.D.   On: 03/20/2017 12:58   Ir Paracentesis  Result Date: 03/07/2017 INDICATION: Cirrhosis, ascites, abdominal distension EXAM: ULTRASOUND GUIDED PARACENTESIS MEDICATIONS: 1% lidocaine local COMPLICATIONS: None immediate. PROCEDURE: An ultrasound guided paracentesis was thoroughly discussed with the patient and questions answered. The benefits, risks, alternatives and complications were also discussed. The patient understands and wishes to proceed with the procedure. Written consent was obtained. Ultrasound was performed to localize and mark an adequate pocket of fluid in the right upper quadrant of the abdomen. The area was then prepped and draped in the normal sterile fashion. 1% Lidocaine was used for local anesthesia. Under ultrasound guidance a Safe-T-Centesis needle catheter was introduced. Paracentesis was performed. The catheter was removed and a dressing  applied. FINDINGS: A total of approximately 2 L of clear ascitic fluid was removed. A fluid sample was sent for laboratory analysis. IMPRESSION: Successful ultrasound guided paracentesis yielding 2 L of ascites. Electronically Signed   By: Judie Petit.  Shick M.D.   On: 03/09/2017 16:52    Microbiology Recent Results (from the past 240 hour(s))  Culture, blood (Routine X 2) w Reflex to ID Panel     Status: None   Collection Time: 03/26/17 10:14 AM  Result Value Ref Range Status   Specimen Description BLOOD RIGHT ANTECUBITAL  Final   Special Requests   Final    BOTTLES DRAWN AEROBIC AND ANAEROBIC Blood Culture adequate volume   Culture   Final    NO GROWTH 5 DAYS Performed at [facility], [address], [place], [place] [address]    Report Status 03/28/2017 FINAL  Final  Culture, blood (Routine X 2) w Reflex to ID Panel     Status: None   Collection Time: 03/26/17 10:14 AM  Result Value Ref Range Status   Specimen Description BLOOD LEFT ANTECUBITAL  Final   Special Requests   Final    BOTTLES DRAWN AEROBIC AND ANAEROBIC Blood Culture adequate volume   Culture   Final    NO GROWTH 5 DAYS Performed at [facility], [address] [place], [place] [address]    Report Status 03/27/2017 FINAL  Final  Body fluid culture     Status: None   Collection Time: 03/26/17  1:04 PM  Result Value Ref Range Status   Specimen Description ANKLE SYNOVIAL  Final   Special Requests NONE  Final   Gram Stain   Final    NO ORGANISMS SEEN WBC PRESENT, PREDOMINANTLY PMN Gram Stain Report Called to,Read Back By and Verified With: D.BLOCK RN AT 1412 ON 03/26/16 BY S.VANHOORNE    Culture   Final    RARE METHICILLIN RESISTANT STAPHYLOCOCCUS AUREUS RESULT CALLED TO, READ BACK BY AND VERIFIED WITH: D BLOCK RN AT 1335 ON [id number] BY SJW Performed at [facility], [address], [place], [place] [address]    Report Status 03/29/2017 FINAL  Final   Organism ID, Bacteria METHICILLIN RESISTANT  STAPHYLOCOCCUS AUREUS  Final      Susceptibility   Methicillin resistant staphylococcus  aureus - MIC*    CIPROFLOXACIN <=0.5 SENSITIVE Sensitive     ERYTHROMYCIN >=8 RESISTANT Resistant     GENTAMICIN <=0.5 SENSITIVE Sensitive     OXACILLIN >=4 RESISTANT Resistant     TETRACYCLINE <=1 SENSITIVE Sensitive     VANCOMYCIN 1 SENSITIVE Sensitive     TRIMETH/SULFA <=10 SENSITIVE Sensitive     CLINDAMYCIN <=0.25 SENSITIVE Sensitive     RIFAMPIN <=0.5 SENSITIVE Sensitive     Inducible Clindamycin NEGATIVE Sensitive     * RARE METHICILLIN RESISTANT STAPHYLOCOCCUS AUREUS  Anaerobic culture     Status: None   Collection Time: 03/26/17  1:04 PM  Result Value Ref Range Status   Specimen Description ANKLE SYNOVIAL  Final   Special Requests NONE  Final   Culture   Final    NO ANAEROBES ISOLATED Performed at [facility], [address] [place], [place] [address]    Report Status 03/12/2017 FINAL  Final  Anaerobic culture     Status: None   Collection Time: 03/06/2017  3:53 PM  Result Value Ref Range Status   Specimen Description WOUND LEFT ANKLE  Final   Special Requests NONE  Final   Culture   Final    NO ANAEROBES ISOLATED Performed at [facility], [address], [place], [place] [address]    Report Status 04/02/2017 FINAL  Final  Aerobic Culture (superficial specimen)     Status: None   Collection Time: 03/10/2017  3:53 PM  Result Value Ref Range Status   Specimen Description WOUND LEFT ANKLE  Final   Special Requests NONE  Final   Gram Stain   Final    RARE WBC PRESENT, PREDOMINANTLY PMN NO ORGANISMS SEEN Performed at [facility], [address], [place], [place] [address]    Culture RARE METHICILLIN RESISTANT STAPHYLOCOCCUS AUREUS  Final   Report Status 03/24/2017 FINAL  Final   Organism ID, Bacteria METHICILLIN RESISTANT STAPHYLOCOCCUS AUREUS  Final      Susceptibility   Methicillin resistant staphylococcus aureus - MIC*    CIPROFLOXACIN <=0.5 SENSITIVE  Sensitive     ERYTHROMYCIN >=8 RESISTANT Resistant     GENTAMICIN <=0.5 SENSITIVE Sensitive     OXACILLIN >=4 RESISTANT Resistant     TETRACYCLINE <=1 SENSITIVE Sensitive     VANCOMYCIN 1 SENSITIVE Sensitive     TRIMETH/SULFA <=10 SENSITIVE Sensitive     CLINDAMYCIN <=0.25 SENSITIVE Sensitive     RIFAMPIN <=0.5 SENSITIVE Sensitive     Inducible Clindamycin NEGATIVE Sensitive     * RARE METHICILLIN RESISTANT STAPHYLOCOCCUS AUREUS  Culture, Urine     Status: None   Collection Time: 03/14/2017  9:40 AM  Result Value Ref Range Status   Specimen Description URINE, CLEAN CATCH  Final   Special Requests NONE  Final   Culture   Final    NO GROWTH Performed at [facility], [address], [place], [place] [address]    Report Status 04/01/2017 FINAL  Final  Aerobic/Anaerobic Culture (surgical/deep wound)     Status: None (Preliminary result)   Collection Time: 03/04/2017  4:06 PM  Result Value Ref Range Status   Specimen Description   Final    CHEST RIGHT Performed at [facility], [address] [place], [place] [address]    Special Requests   Final    Normal Performed at [facility], [address], [place], [place] [address]    Gram Stain   Final  MODERATE WBC PRESENT, PREDOMINANTLY PMN NO ORGANISMS SEEN Performed at [facility], [address], [place], [place] [address]    Culture   Final    FEW METHICILLIN RESISTANT STAPHYLOCOCCUS AUREUS NO ANAEROBES ISOLATED; CULTURE IN PROGRESS FOR 5 DAYS    Report Status PENDING  Incomplete   Organism ID, Bacteria METHICILLIN RESISTANT STAPHYLOCOCCUS AUREUS  Final      Susceptibility   Methicillin resistant staphylococcus aureus - MIC*    CIPROFLOXACIN <=0.5 SENSITIVE Sensitive     ERYTHROMYCIN >=8 RESISTANT Resistant     GENTAMICIN <=0.5 SENSITIVE Sensitive     OXACILLIN >=4 RESISTANT Resistant     TETRACYCLINE <=1 SENSITIVE Sensitive     VANCOMYCIN 1 SENSITIVE Sensitive      TRIMETH/SULFA <=10 SENSITIVE Sensitive     CLINDAMYCIN <=0.25 SENSITIVE Sensitive     RIFAMPIN <=0.5 SENSITIVE Sensitive     Inducible Clindamycin NEGATIVE Sensitive     * FEW METHICILLIN RESISTANT STAPHYLOCOCCUS AUREUS  Fungus Culture With Stain     Status: None (Preliminary result)   Collection Time: 03/04/2017  4:57 PM  Result Value Ref Range Status   Fungus Stain Final report  Final    Comment: (NOTE) Performed At: [facility] [address] [place], [place] [id number] Jolene Schimke MD [id number]    Fungus (Mycology) Culture PENDING  Incomplete   Fungal Source PERITONEAL  Final  Culture, body fluid-bottle     Status: None (Preliminary result)   Collection Time: 03/14/2017  4:57 PM  Result Value Ref Range Status   Specimen Description FLUID PERITONEAL  Final   Special Requests BOTTLES DRAWN AEROBIC AND ANAEROBIC  Final   Culture   Final    NO GROWTH 4 DAYS Performed at [facility], [address], [place], [place] [address]    Report Status PENDING  Incomplete  Gram stain     Status: None   Collection Time: 04/02/2017  4:57 PM  Result Value Ref Range Status   Specimen Description FLUID PERITONEAL  Final   Special Requests NONE  Final   Gram Stain   Final    RARE WBC PRESENT, PREDOMINANTLY PMN NO ORGANISMS SEEN Performed at [facility], [address] [place], [place] [address]    Report Status 04/01/2017 FINAL  Final  Fungus Culture Result     Status: None   Collection Time: 03/11/2017  4:57 PM  Result Value Ref Range Status   Result 1 Comment  Final    Comment: (NOTE) KOH/Calcofluor preparation:  no fungus observed. Performed At: [facility] [address] [place], [place] [id number] Jolene Schimke MD KG:[id number]     Lab Basic Metabolic Panel: Recent Labs  Lab 03/22/2017 0327 03/10/2017 1725 04/01/17 0338 04/02/17 0303 03/06/2017 0400  NA 136 138 139 142 142  K 6.3* 5.3* 4.5 4.7 5.4*  CL 113* 112* 111 109 106  CO2 16* 18* 19*  23 22  GLUCOSE 233* 213* 182* 116* 98  BUN 89* 90* 94* 105* 125*  CREATININE 2.58* 2.96* 3.12* 3.21* 4.07*  CALCIUM 7.7* 7.7* 7.6* 7.3* 7.1*  MG 2.5*  --   --   --   --   PHOS  --  5.5*  --   --  9.0*   Liver Function Tests: Recent Labs  Lab 03/30/17 0552 03/12/2017 0327 03/20/2017 1725 04/01/17 0338 04/02/17 0303 03/10/2017 0400  AST 152* 263*  --  200* 177* 211*  ALT 60 88*  --  88* 80*  83*  ALKPHOS 252* 231*  --  209* 187* 168*  BILITOT 11.7* 14.0*  --  13.4* 14.3* 15.4*  PROT 6.8 6.4*  --  6.6 6.1* 5.8*  ALBUMIN 1.6* 1.4* 1.4* 1.3* 1.3* 1.2*  1.2*   No results for input(s): LIPASE, AMYLASE in the last 168 hours. Recent Labs  Lab 03/30/17 1631 04/02/17 0906 03/16/2017 0400  AMMONIA 82* 68* 74*   CBC: Recent Labs  Lab 03/29/17 0632 03/30/17 0552 03/17/2017 0327 04/01/17 0338 03/29/2017 0400  WBC 31.2* 26.2* 26.4* 21.2* 21.8*  NEUTROABS 27.1* 21.7* 22.4* 18.7*  --   HGB 12.7* 12.4* 12.4* 12.6* 13.4  HCT 37.5* 36.5* 36.9* 36.8* 40.2  MCV 90.8 91.5 92.3 90.9 93.3  PLT 217 182 159 122* PLATELET CLUMPS NOTED ON SMEAR, UNABLE TO ESTIMATE   Cardiac Enzymes: No results for input(s): CKTOTAL, CKMB, CKMBINDEX, TROPONINI in the last 168 hours. Sepsis Labs: Recent Labs  Lab 03/30/17 0552 03/30/2017 0327 04/01/17 0338 03/08/2017 0400  WBC 26.2* 26.4* 21.2* 21.8*    Procedures/Operations   Ultrasound-guided aspiration left ankle joint effusion Per Dr. Lowella Dandy interventional radiology 03/26/2017  Irrigation and debridement of left ankle per Dr. August Saucer 03-Apr-2017  CT-guided right chest wall aspiration and biopsy attempt per Dr. Denny Levy 03/29/2017  Ultrasound-guided paracentesis 03/07/2017--2 L ascitic fluid removed per Dr. Tarri Fuller Deolinda Frid 04/27/2017, 2:33 PM

## 2017-05-02 DEATH — deceased

## 2017-05-20 ENCOUNTER — Telehealth: Payer: Self-pay | Admitting: Internal Medicine

## 2017-05-20 NOTE — Telephone Encounter (Signed)
FYI

## 2017-05-20 NOTE — Telephone Encounter (Signed)
Copied from CRM [id number]. Topic: Inquiry >> May 19, 2017  5:19 PM Alexander BergeronBarksdale, Kurt Reid: Reason for CRM: pt's wife called to tell pcp husband has passed, and mentioned whatever nurse helped pt to get in hospital that nurse was amazing

## 2017-05-21 ENCOUNTER — Ambulatory Visit: Payer: Medicare Other | Admitting: Internal Medicine

## 2019-03-19 IMAGING — CR DG CHEST 2V
3 series · 3 of 3 positions shown · non-contrast
Comparison: 03/10/2017

CLINICAL DATA: Confusion.

EXAM:
CHEST  2 VIEW

[w chest lat]
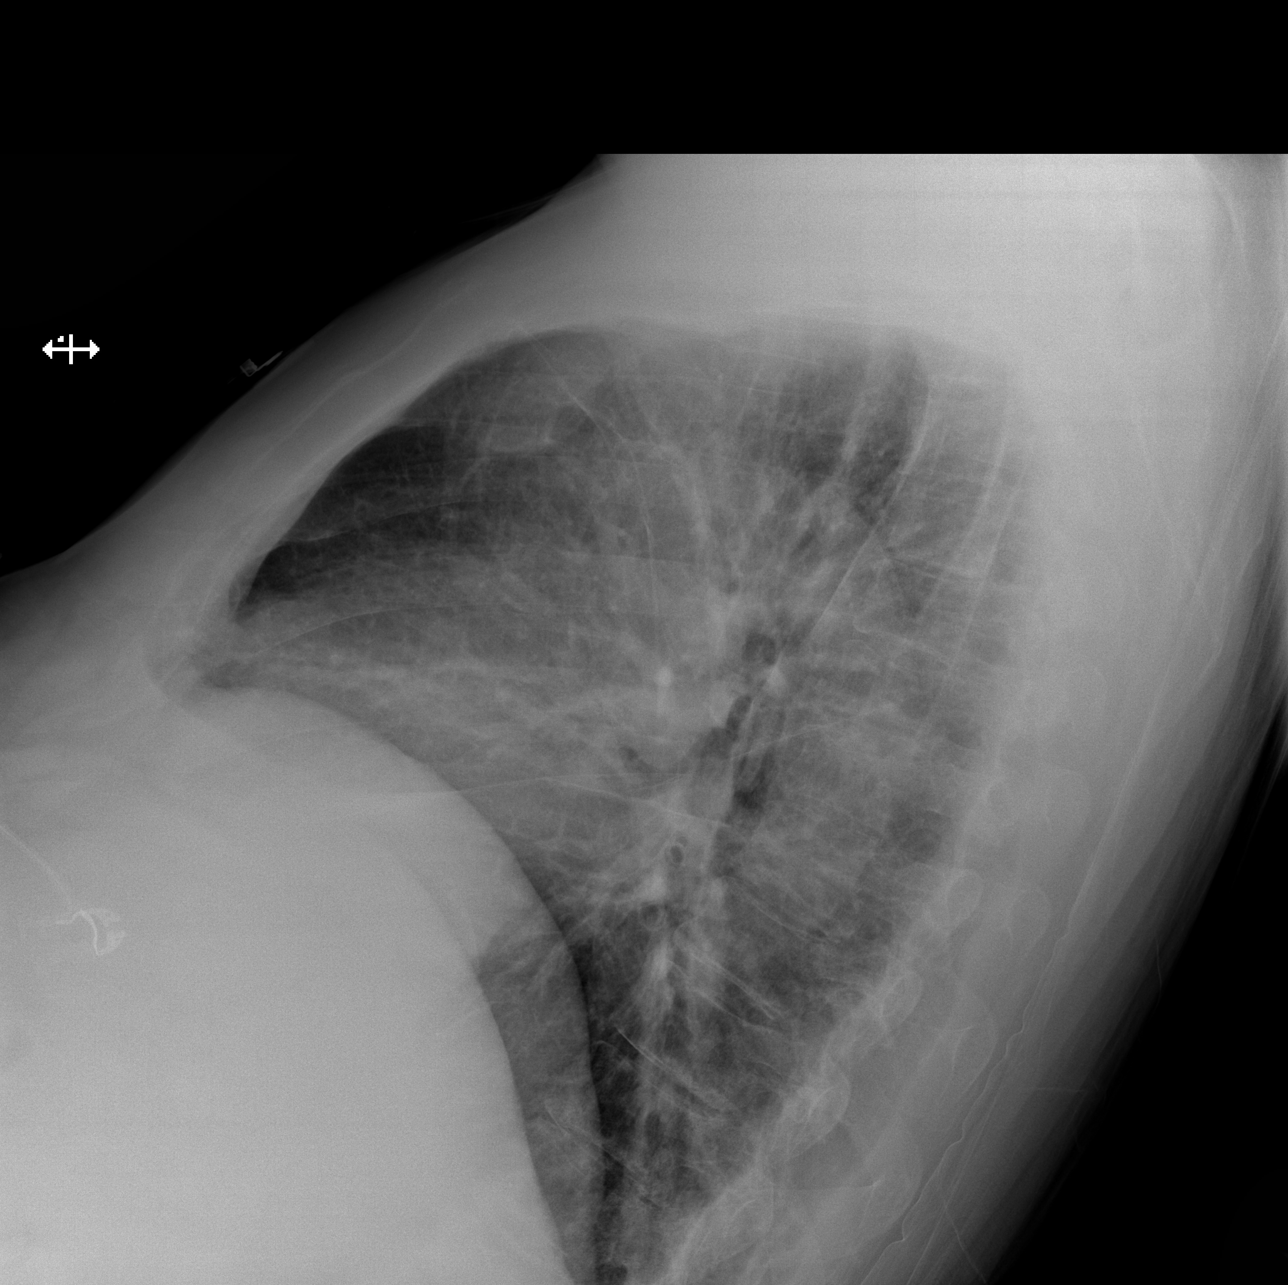

[x chest ap (1 of 2)]
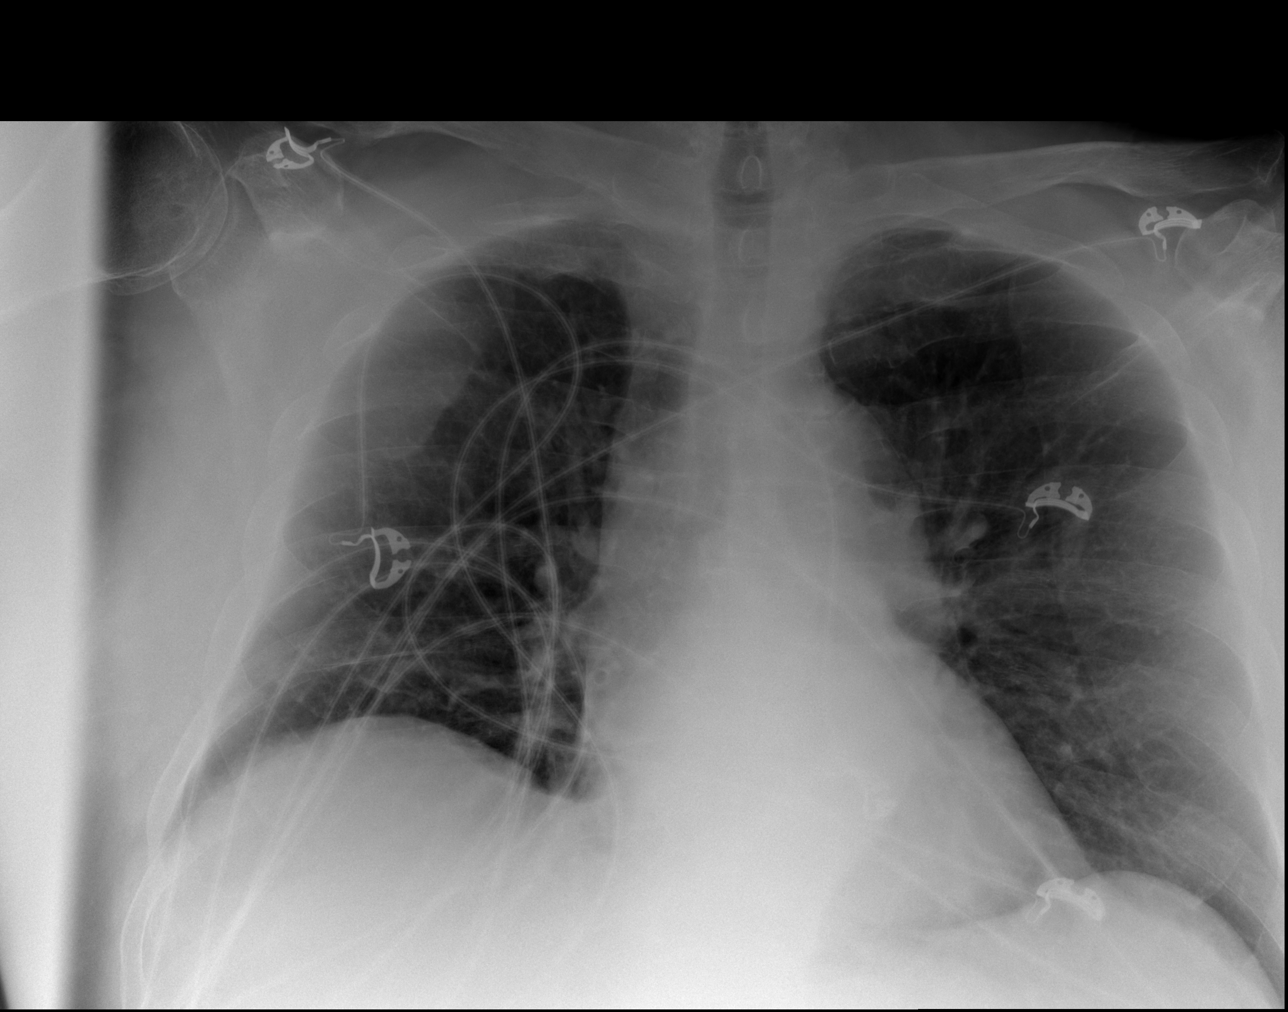

[x chest ap (2 of 2)]
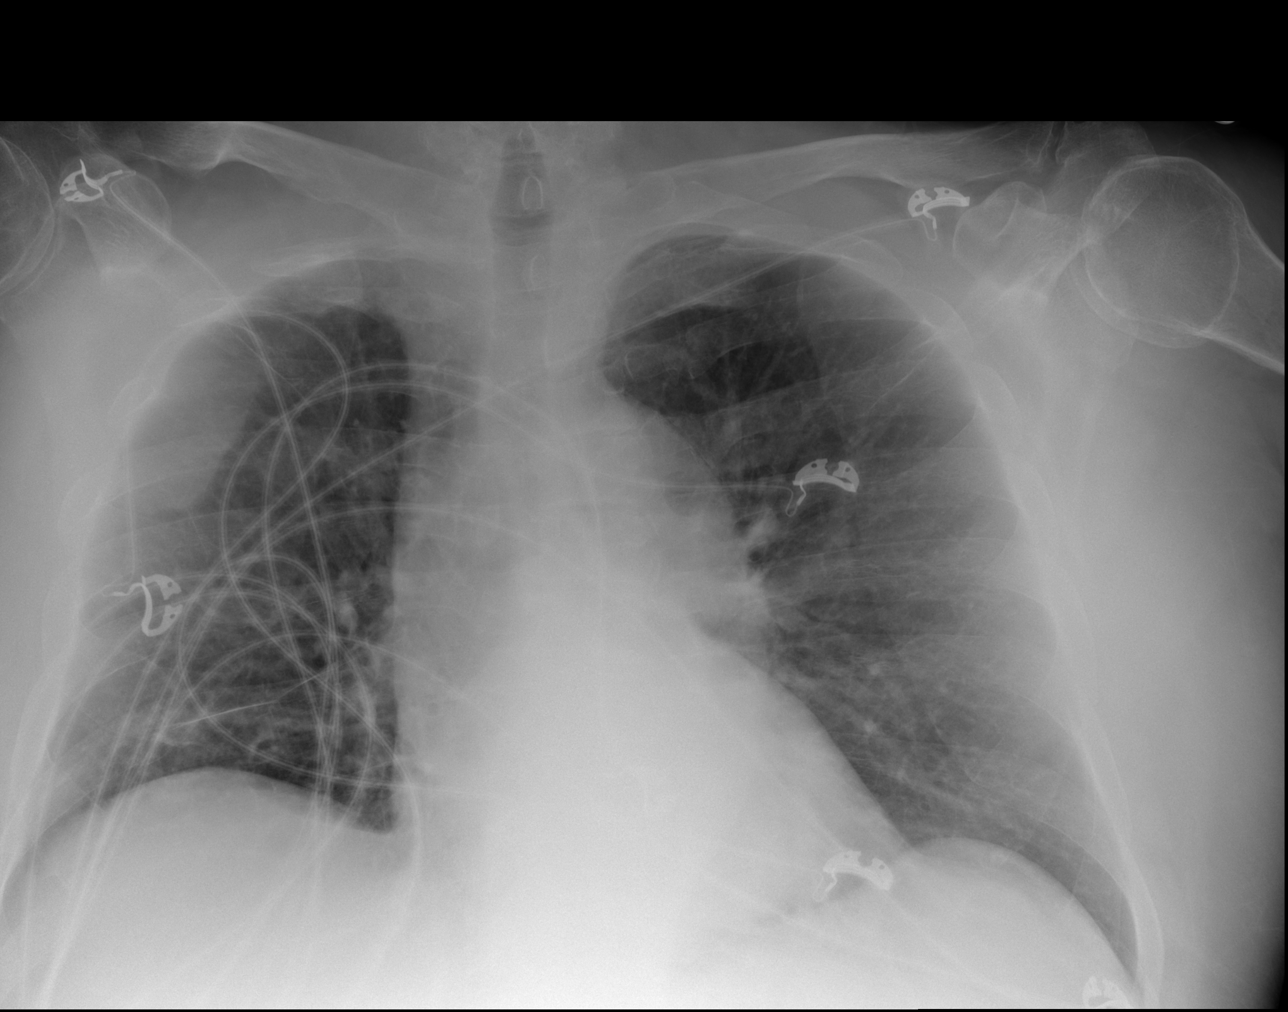

[3 of 3 positions shown; findings below may reference images not displayed]

FINDINGS: The cardiac silhouette, mediastinal and hilar contours are within
normal limits and stable. Stable tortuosity of the thoracic aorta.
The lungs demonstrate mild chronic bronchitic changes but no
infiltrates, edema or effusions.
IMPRESSION: No acute cardiopulmonary findings.

## 2019-03-21 IMAGING — DX DG ANKLE COMPLETE 3+V*L*
3 series · 3 of 3 positions shown · non-contrast
Comparison: Recent prior radiographs of the left ankle 03/21/2017

CLINICAL DATA: 66-year-old male with chronic left ankle pain

EXAM:
LEFT ANKLE COMPLETE - 3+ VIEW

[ankle ap]
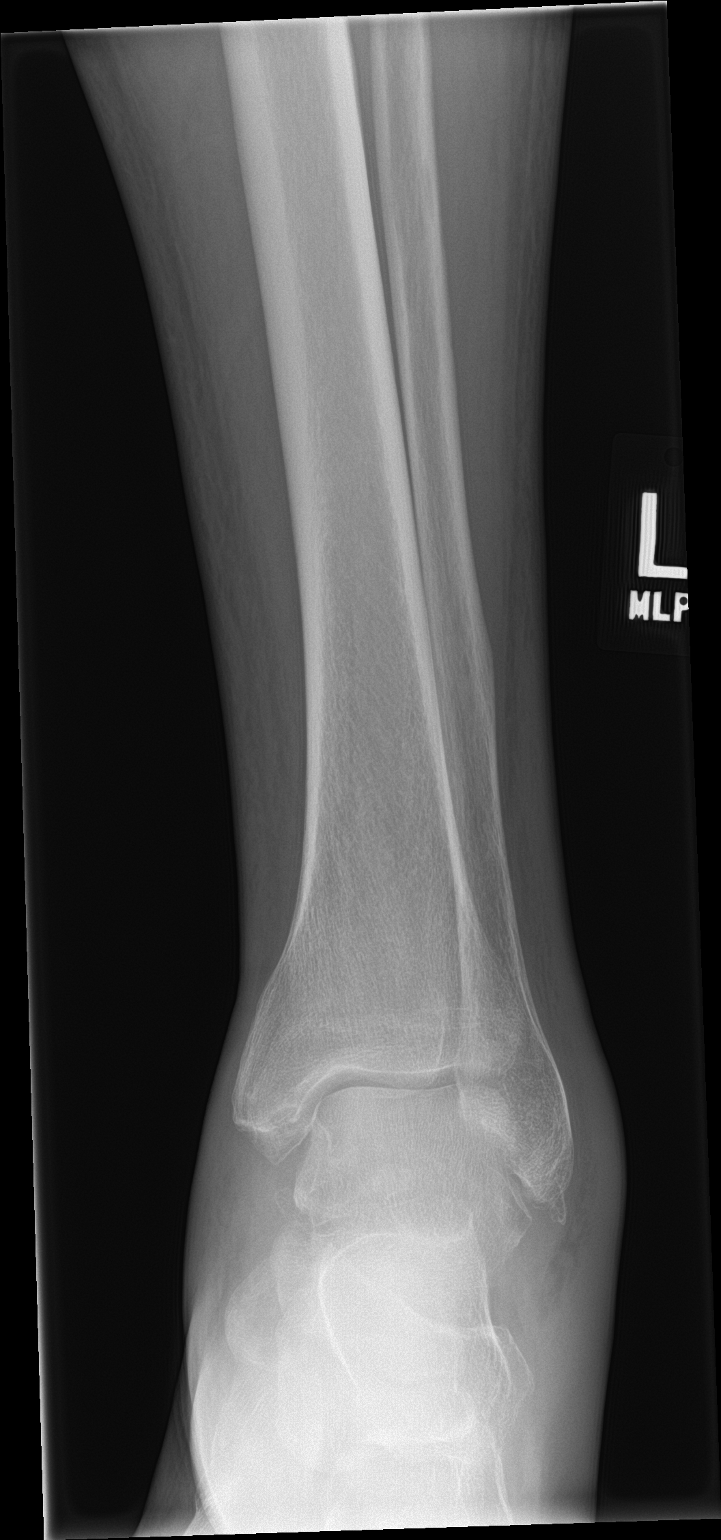

[ankle obl]
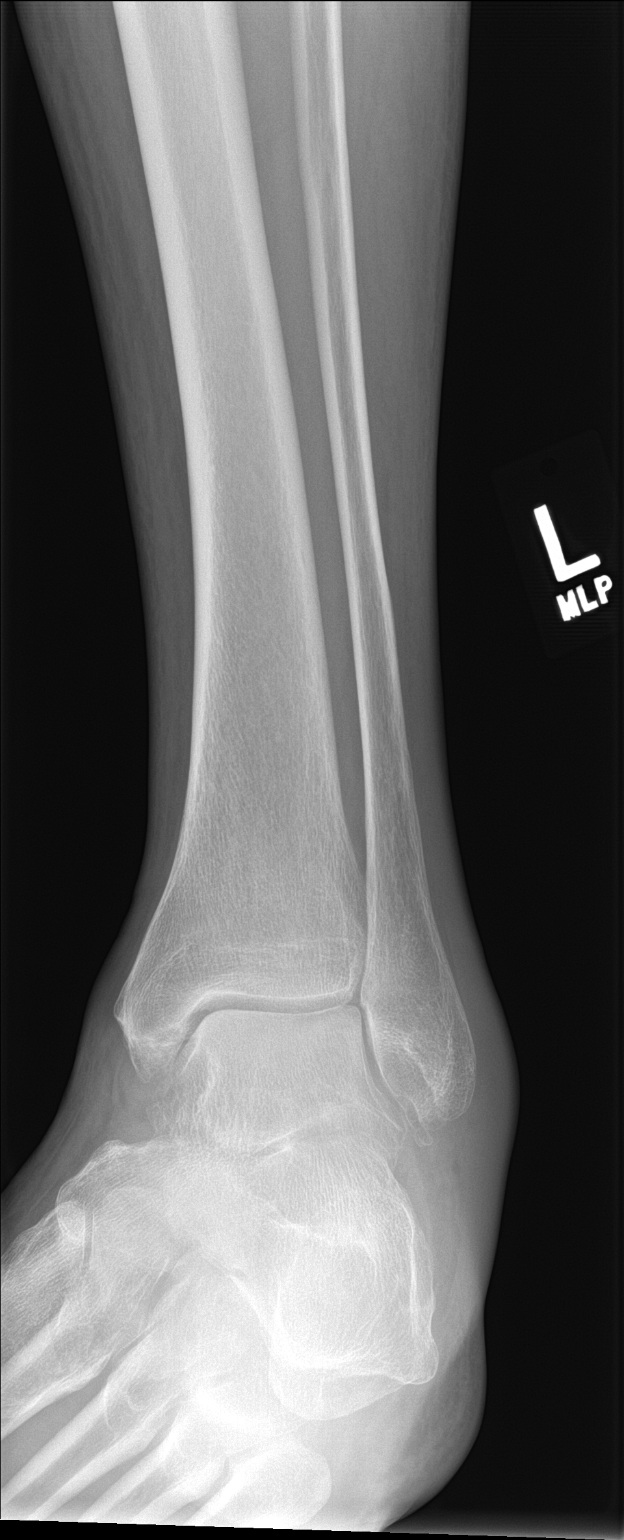

[ankle lat]
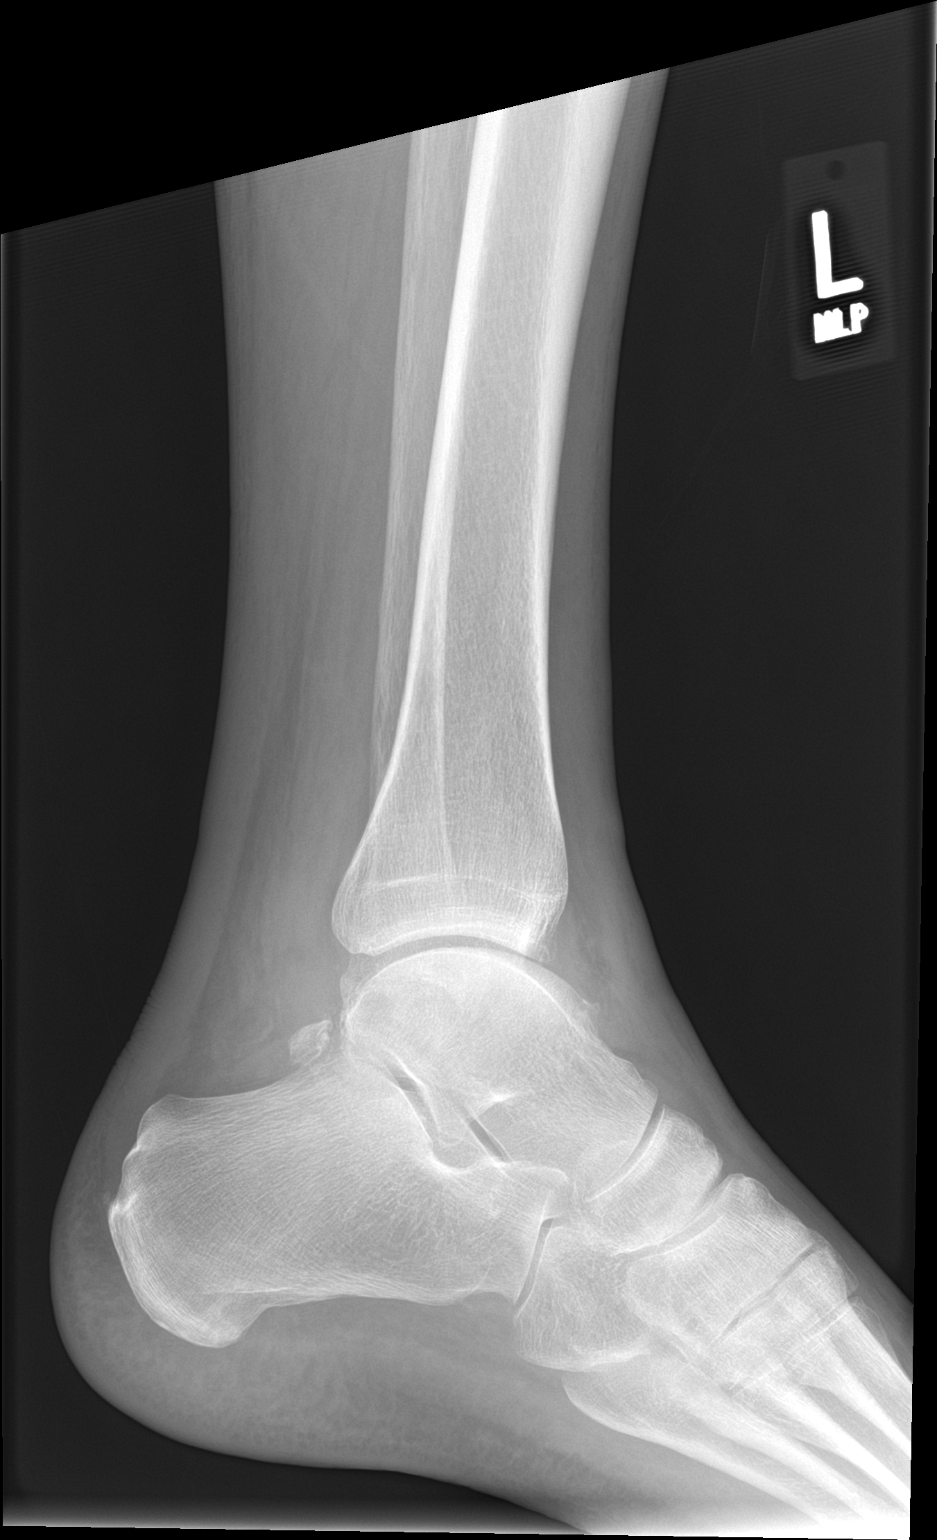

[3 of 3 positions shown; findings below may reference images not displayed]

FINDINGS: Diffuse soft tissue swelling about the ankle. Probable small ankle
joint effusion. No significant interval change in the 2 days since
the prior radiograph. No destructive bony changes. Mild tibiotalar
and fibulotalar degenerative changes.
IMPRESSION: 1. Persistent diffuse soft tissue swelling and small ankle joint
effusion without evidence of fracture or malalignment.
2. Mild degenerative osteoarthritis again noted. No progressive or
destructive bony changes at this time.
